# Patient Record
Sex: Female | Born: 1960 | Race: White | Hispanic: No | Smoking: Current every day smoker
Health system: Southern US, Community
[De-identification: ages and names within clinical notes are randomized; demographics above are authoritative.]

## PROBLEM LIST (undated history)

## (undated) DIAGNOSIS — E119 Type 2 diabetes mellitus without complications: Secondary | ICD-10-CM

## (undated) DIAGNOSIS — M436 Torticollis: Secondary | ICD-10-CM

## (undated) DIAGNOSIS — G8929 Other chronic pain: Secondary | ICD-10-CM

## (undated) DIAGNOSIS — M7918 Myalgia, other site: Secondary | ICD-10-CM

## (undated) DIAGNOSIS — N319 Neuromuscular dysfunction of bladder, unspecified: Secondary | ICD-10-CM

## (undated) DIAGNOSIS — I10 Essential (primary) hypertension: Secondary | ICD-10-CM

## (undated) HISTORY — DX: Other chronic pain: G89.29

## (undated) HISTORY — DX: Torticollis: M43.6

## (undated) HISTORY — DX: Essential (primary) hypertension: I10

## (undated) HISTORY — DX: Myalgia, other site: M79.18

## (undated) HISTORY — DX: Neuromuscular dysfunction of bladder, unspecified: N31.9

## (undated) HISTORY — DX: Type 2 diabetes mellitus without complications: E11.9

---

## 1998-08-25 ENCOUNTER — Other Ambulatory Visit: Admission: RE | Admit: 1998-08-25 | Discharge: 1998-08-25 | Payer: Self-pay | Admitting: Obstetrics and Gynecology

## 1999-09-19 ENCOUNTER — Other Ambulatory Visit: Admission: RE | Admit: 1999-09-19 | Discharge: 1999-09-19 | Payer: Self-pay | Admitting: General Practice

## 2003-11-11 ENCOUNTER — Emergency Department (HOSPITAL_COMMUNITY): Admission: EM | Admit: 2003-11-11 | Discharge: 2003-11-11 | Payer: Self-pay | Admitting: Emergency Medicine

## 2005-03-18 ENCOUNTER — Emergency Department (HOSPITAL_COMMUNITY): Admission: EM | Admit: 2005-03-18 | Discharge: 2005-03-18 | Payer: Self-pay | Admitting: Emergency Medicine

## 2008-04-26 ENCOUNTER — Emergency Department (HOSPITAL_BASED_OUTPATIENT_CLINIC_OR_DEPARTMENT_OTHER): Admission: EM | Admit: 2008-04-26 | Discharge: 2008-04-26 | Payer: Self-pay | Admitting: Emergency Medicine

## 2019-04-18 IMAGING — CR DG THORACOLUMBAR SPINE 2V
1 series · 1 of 1 positions shown · non-contrast
Comparison: Intraoperative imaging earlier

CLINICAL DATA: L1-2 laminectomy

EXAM:
THORACOLUMBAR SPINE 1V

[lateral]
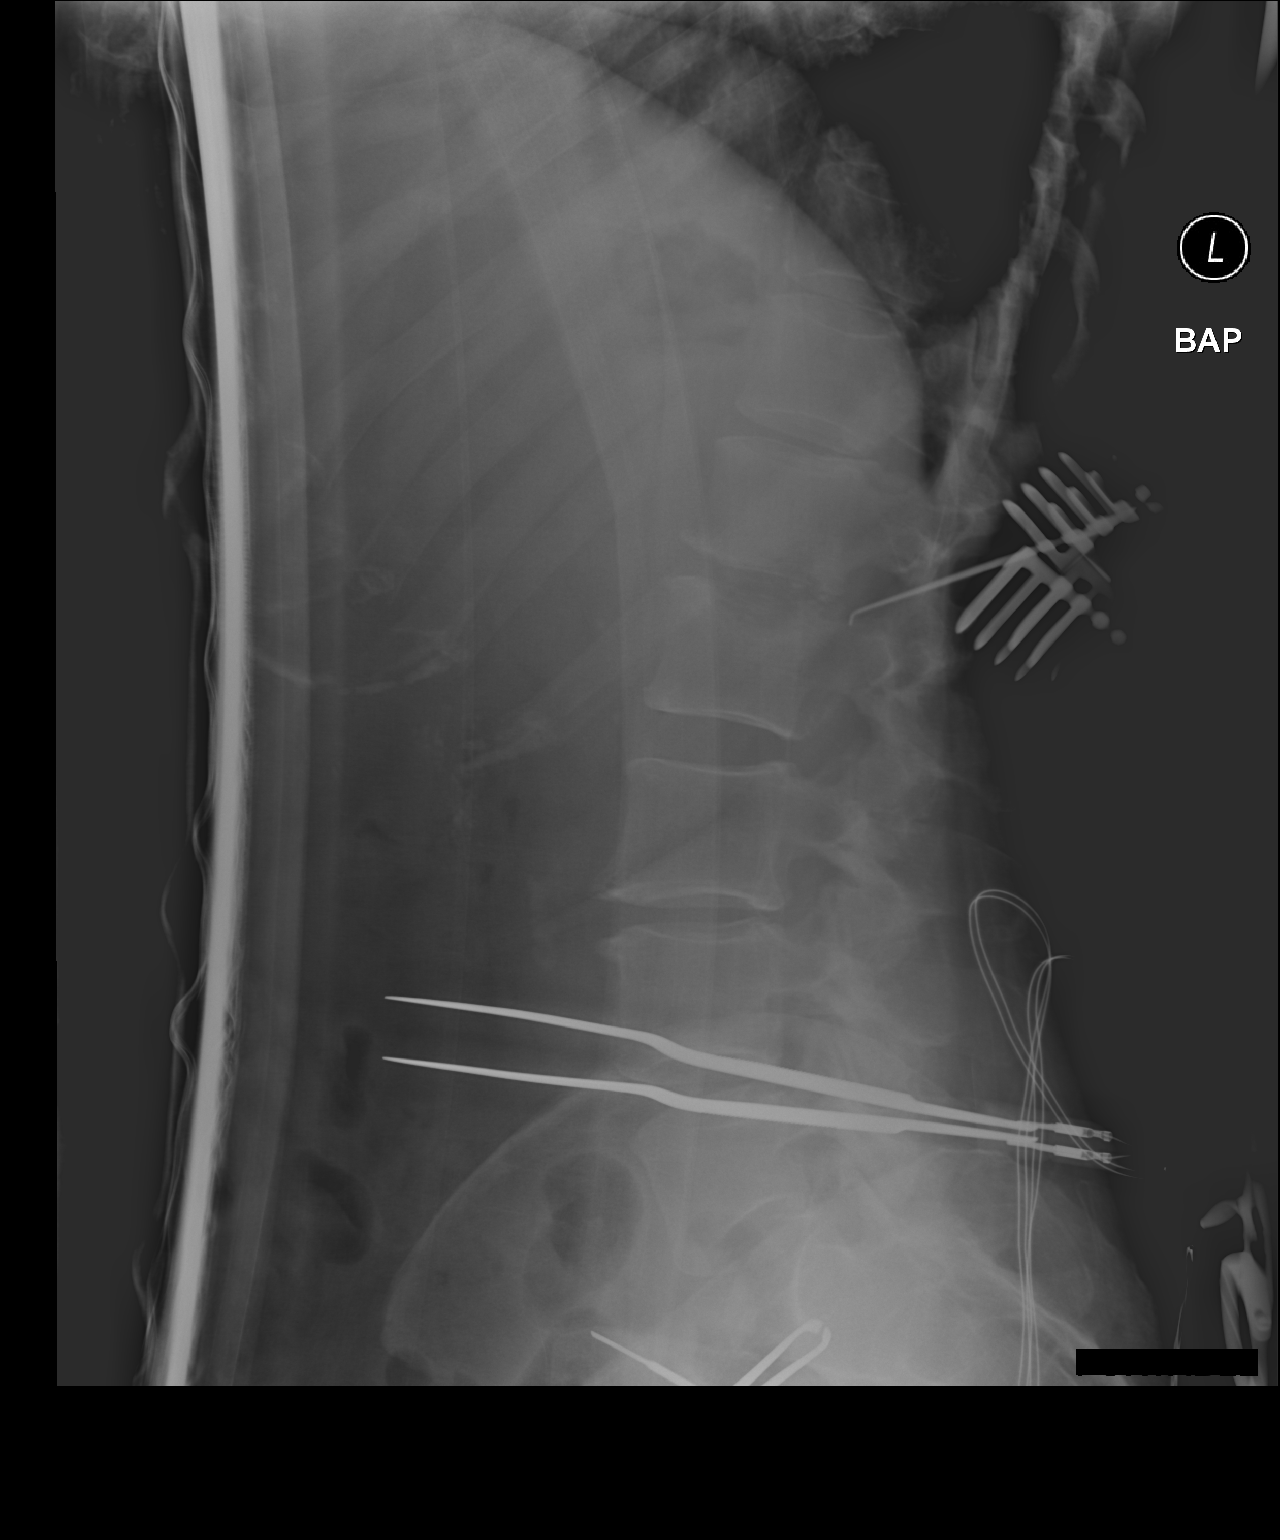

[1 of 1 positions shown; findings below may reference images not displayed]

FINDINGS: Single cross-table lateral view of the lumbar spine demonstrates
posterior surgical instruments directed at the L1-2 level.
IMPRESSION: Intraoperative localization as above.

## 2019-04-18 IMAGING — CR DG THORACOLUMBAR SPINE 2V
3 series · 3 of 3 positions shown · non-contrast
Comparison: None.

CLINICAL DATA: Elective surgery

EXAM:
THORACOLUMBAR SPINE 1V

[lateral (1 of 3)]
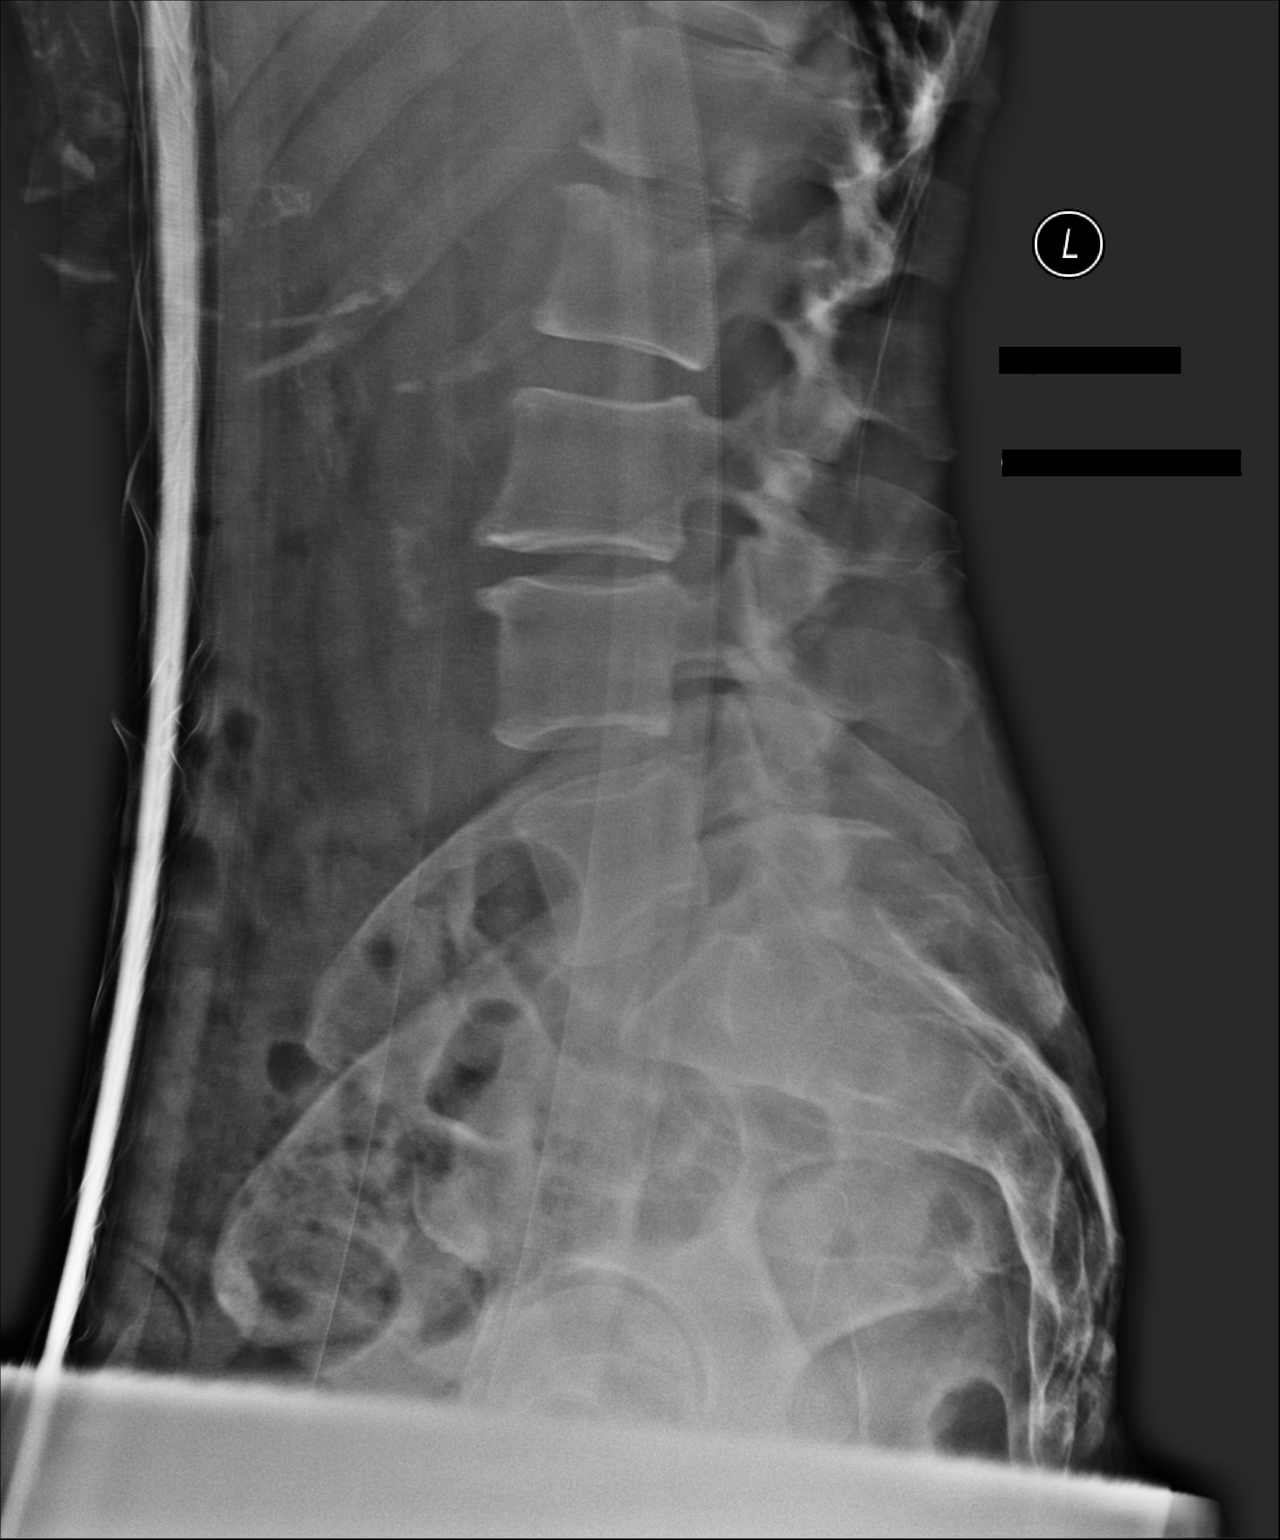

[lateral (2 of 3)]
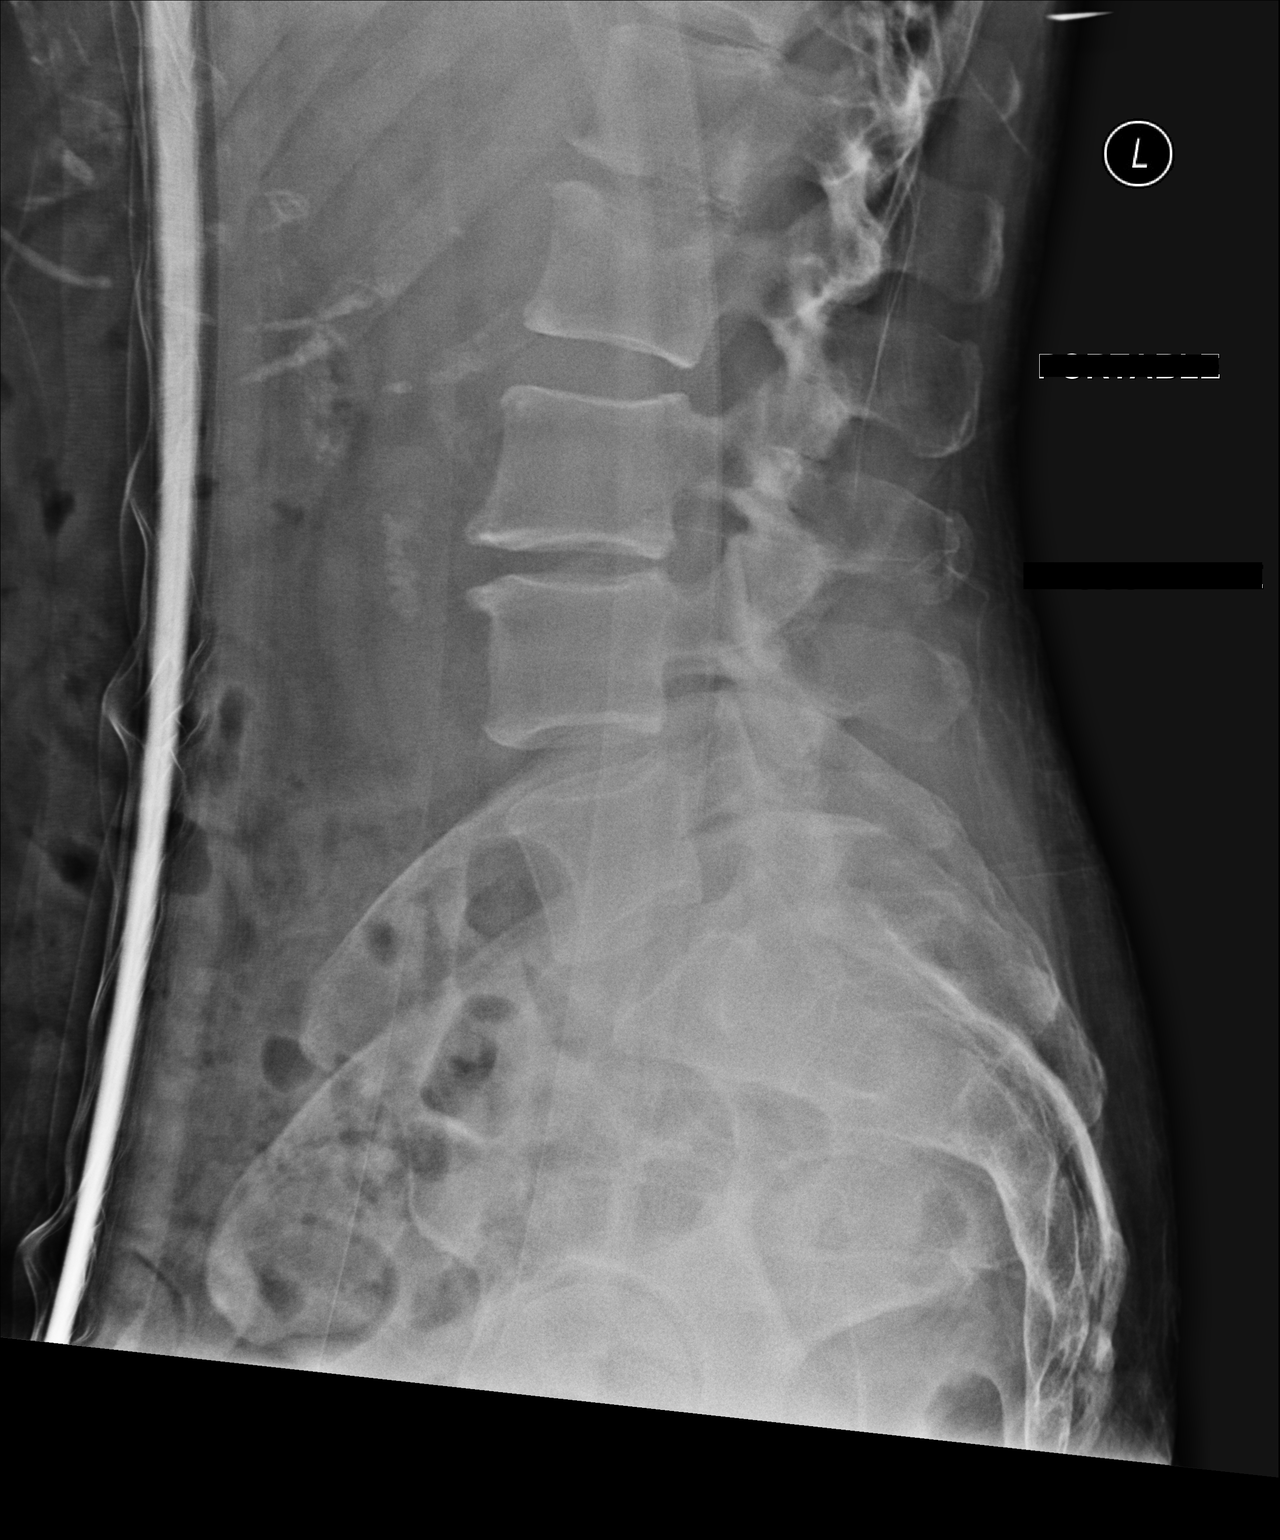

[lateral (3 of 3)]
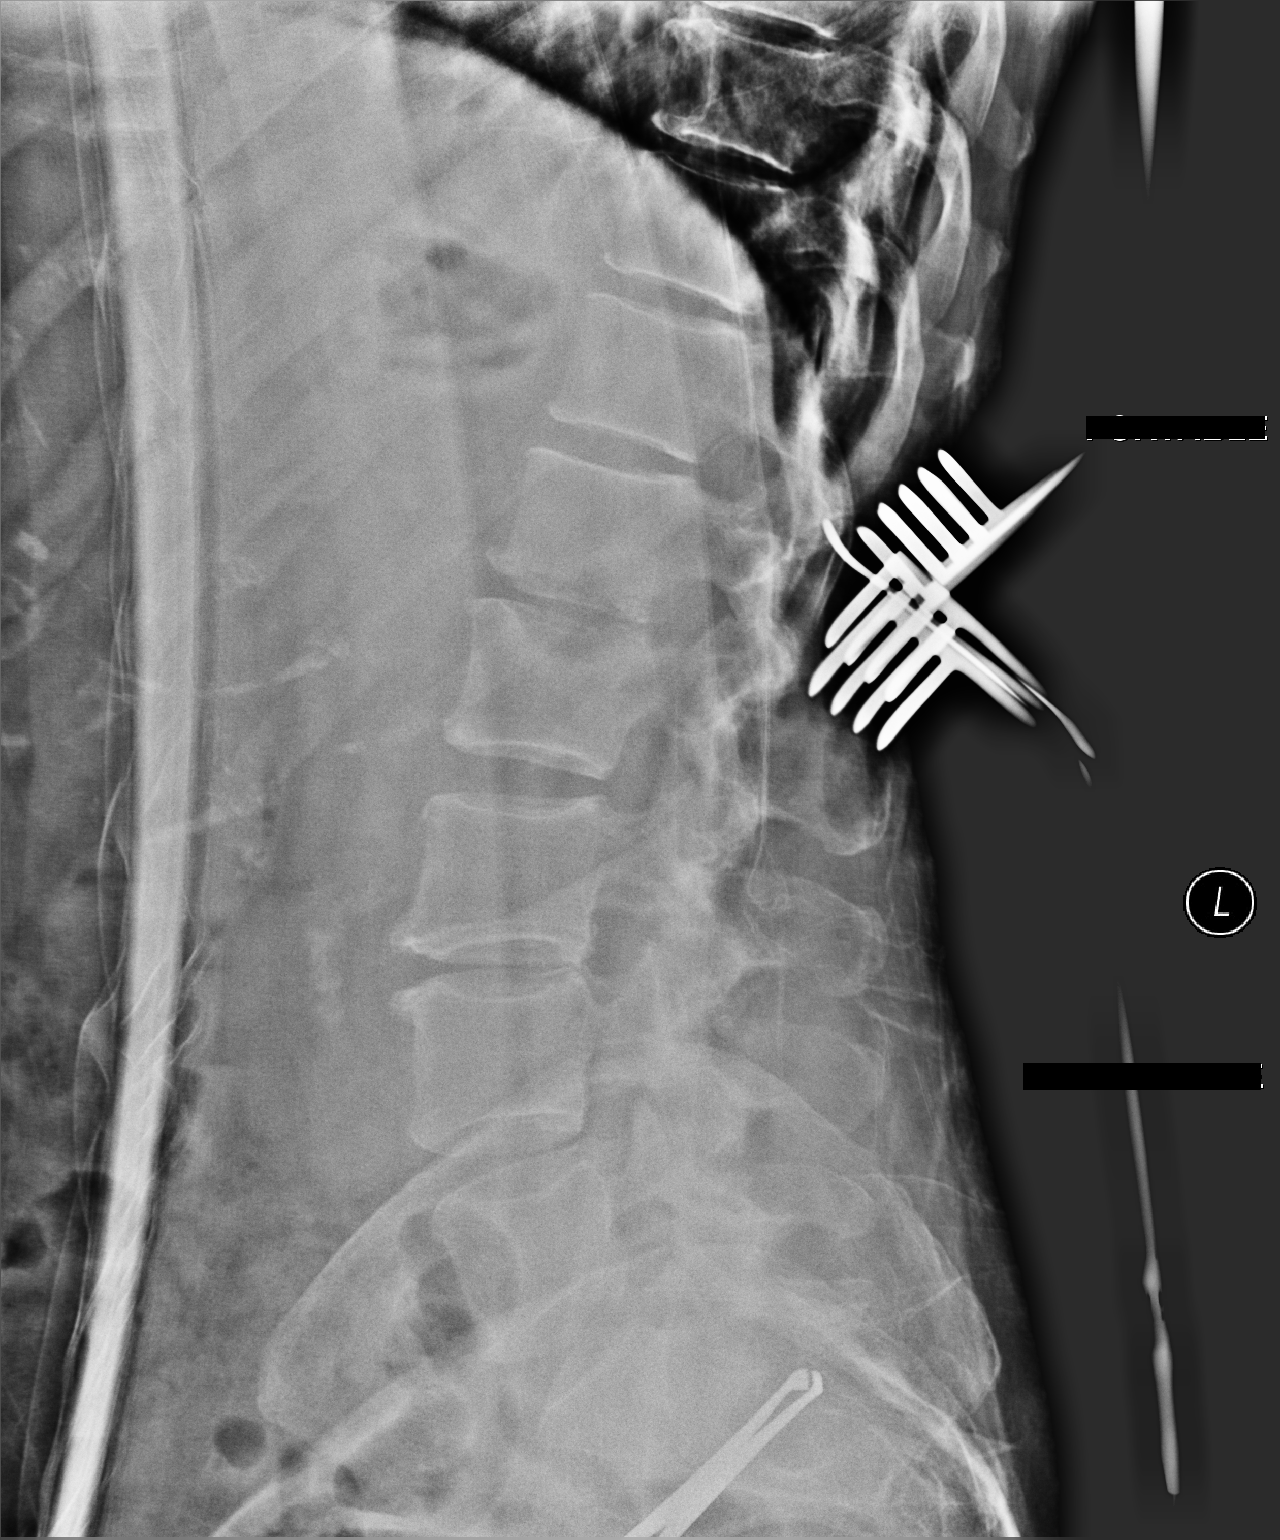

[3 of 3 positions shown; findings below may reference images not displayed]

FINDINGS: Cross-table lateral views of the lumbar spine are submitted. Final
image demonstrates posterior surgical instruments directed at the
T12-L1 level.
IMPRESSION: Intraoperative localization as above.

## 2020-02-24 ENCOUNTER — Emergency Department (HOSPITAL_COMMUNITY)
Admission: EM | Admit: 2020-02-24 | Discharge: 2020-02-25 | Disposition: A | Discharge status: Home / Self Care | Payer: Medicaid Other | Attending: Emergency Medicine | Admitting: Emergency Medicine

## 2020-02-24 ENCOUNTER — Encounter (HOSPITAL_COMMUNITY): Payer: Self-pay

## 2020-02-24 ENCOUNTER — Emergency Department (HOSPITAL_COMMUNITY): Payer: Medicaid Other

## 2020-02-24 ENCOUNTER — Other Ambulatory Visit: Payer: Self-pay

## 2020-02-24 DIAGNOSIS — M545 Low back pain, unspecified: Secondary | ICD-10-CM

## 2020-02-24 DIAGNOSIS — M549 Dorsalgia, unspecified: Secondary | ICD-10-CM | POA: Diagnosis present

## 2020-02-24 DIAGNOSIS — K5900 Constipation, unspecified: Secondary | ICD-10-CM | POA: Insufficient documentation

## 2020-02-24 DIAGNOSIS — E876 Hypokalemia: Secondary | ICD-10-CM | POA: Diagnosis not present

## 2020-02-24 DIAGNOSIS — F17210 Nicotine dependence, cigarettes, uncomplicated: Secondary | ICD-10-CM | POA: Diagnosis not present

## 2020-02-24 LAB — CBC
HCT: 38 % (ref 36.0–46.0)
Hemoglobin: 13.2 g/dL (ref 12.0–15.0)
MCH: 29.1 pg (ref 26.0–34.0)
MCHC: 34.7 g/dL (ref 30.0–36.0)
MCV: 83.9 fL (ref 80.0–100.0)
Platelets: 299 10*3/uL (ref 150–400)
RBC: 4.53 MIL/uL (ref 3.87–5.11)
RDW: 13.3 % (ref 11.5–15.5)
WBC: 10.1 10*3/uL (ref 4.0–10.5)
nRBC: 0 % (ref 0.0–0.2)

## 2020-02-24 LAB — URINALYSIS, ROUTINE W REFLEX MICROSCOPIC
Bacteria, UA: NONE SEEN
Bilirubin Urine: NEGATIVE
Glucose, UA: NEGATIVE mg/dL
Ketones, ur: 5 mg/dL — AB
Leukocytes,Ua: NEGATIVE
Nitrite: NEGATIVE
Protein, ur: 100 mg/dL — AB
Specific Gravity, Urine: 1.011 (ref 1.005–1.030)
pH: 6 (ref 5.0–8.0)

## 2020-02-24 LAB — COMPREHENSIVE METABOLIC PANEL
ALT: 22 U/L (ref 0–44)
AST: 21 U/L (ref 15–41)
Albumin: 3.8 g/dL (ref 3.5–5.0)
Alkaline Phosphatase: 77 U/L (ref 38–126)
Anion gap: 16 — ABNORMAL HIGH (ref 5–15)
BUN: 9 mg/dL (ref 6–20)
CO2: 24 mmol/L (ref 22–32)
Calcium: 9.5 mg/dL (ref 8.9–10.3)
Chloride: 96 mmol/L — ABNORMAL LOW (ref 98–111)
Creatinine, Ser: 0.67 mg/dL (ref 0.44–1.00)
GFR calc Af Amer: >60 mL/min (ref >60)
GFR calc non Af Amer: >60 mL/min (ref >60)
Glucose, Bld: 139 mg/dL — ABNORMAL HIGH (ref 70–99)
Potassium: 2.7 mmol/L — CL (ref 3.5–5.1)
Sodium: 136 mmol/L (ref 135–145)
Total Bilirubin: 0.9 mg/dL (ref 0.3–1.2)
Total Protein: 8.6 g/dL — ABNORMAL HIGH (ref 6.5–8.1)

## 2020-02-24 LAB — LIPASE, BLOOD: Lipase: 23 U/L (ref 11–51)

## 2020-02-24 IMAGING — CT CT ABD-PELV W/O CM
2 of 4 series · 16 of 46 positions shown, 18 images · non-contrast
Comparison: None.

CLINICAL DATA: No bowel movement for 5 days, tobacco abuse

EXAM:
CT ABDOMEN AND PELVIS WITHOUT CONTRAST
TECHNIQUE: Multidetector CT imaging of the abdomen and pelvis was performed
following the standard protocol without IV contrast.

[Series 2: axial st · axial · 0.69mm/px · z∈[-462,-92]mm · 13 of 84 slices shown, 15 images]
[im 5/84  soft-tissue]
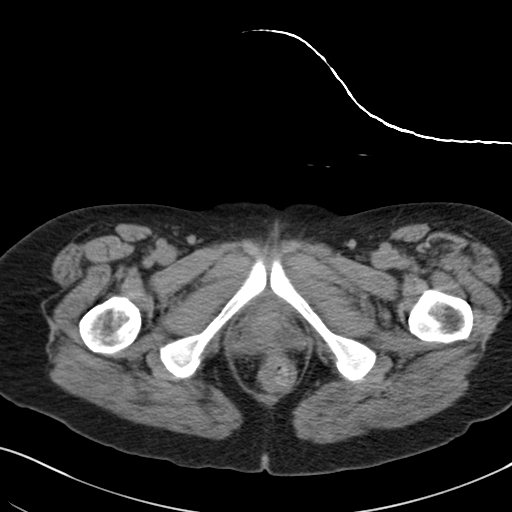
[im 5/84  bone]
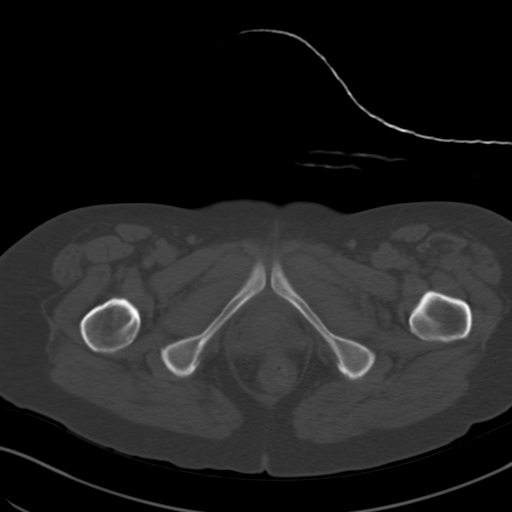
[im 10/84  soft-tissue]
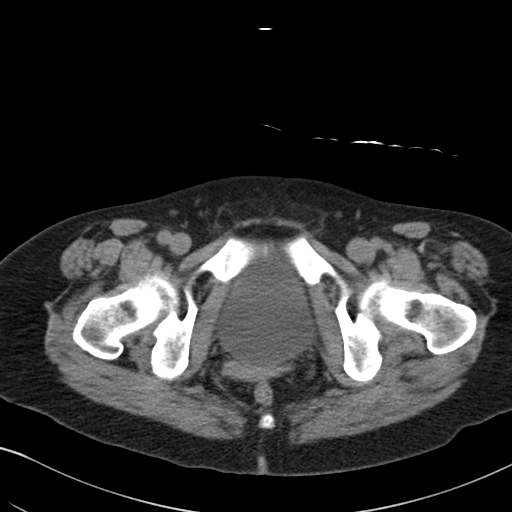
[im 19/84  soft-tissue]
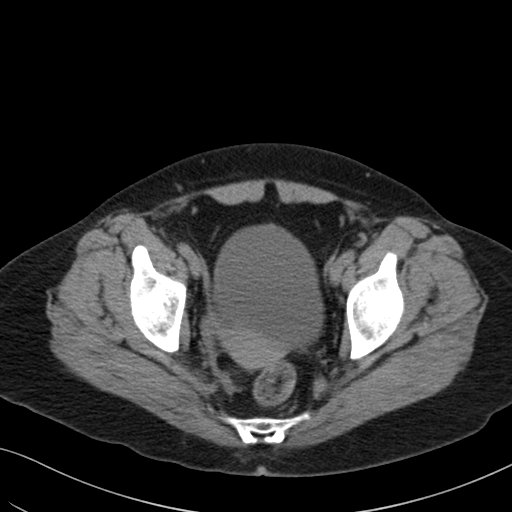
[im 24/84  soft-tissue]
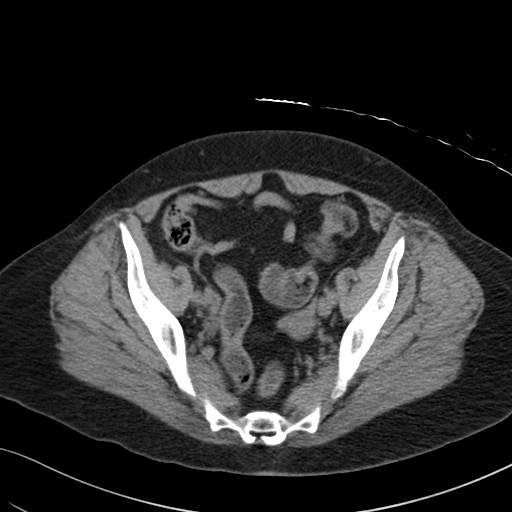
[im 28/84  soft-tissue]
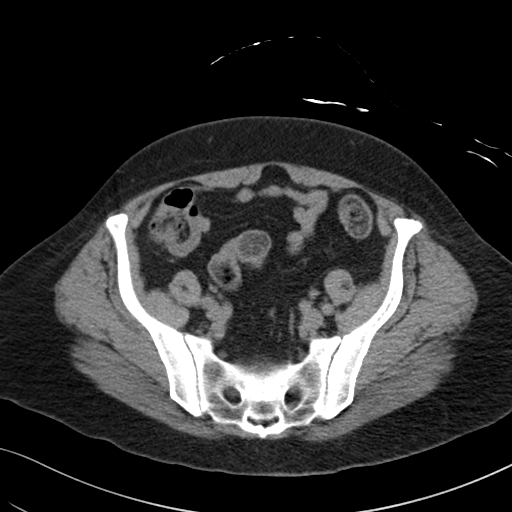
[im 37/84  soft-tissue]
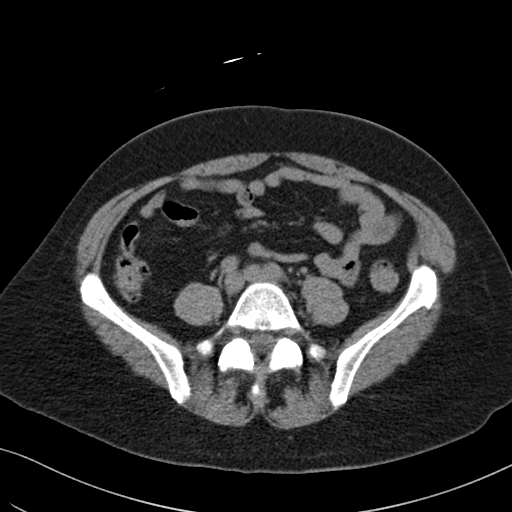
[im 42/84  soft-tissue]
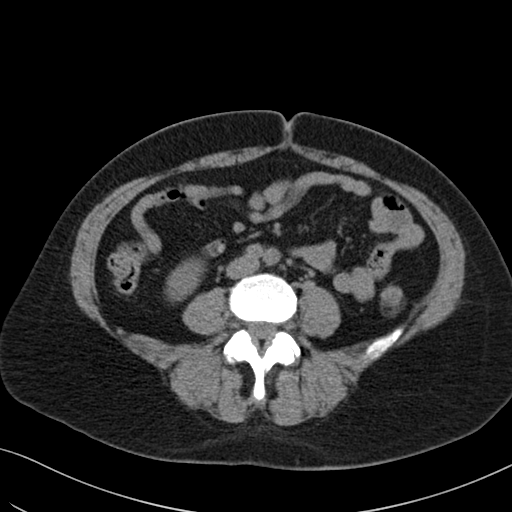
[im 47/84  soft-tissue]
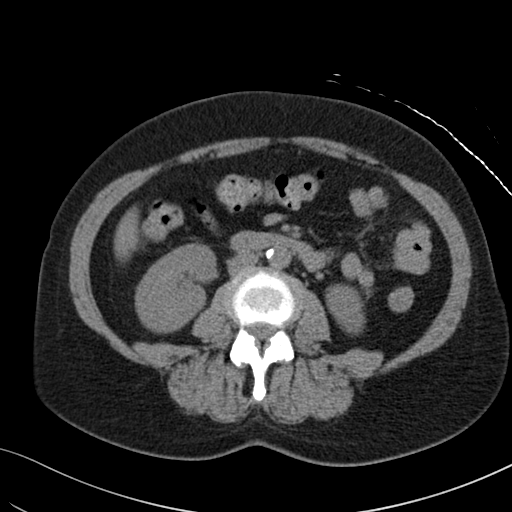
[im 56/84  soft-tissue]
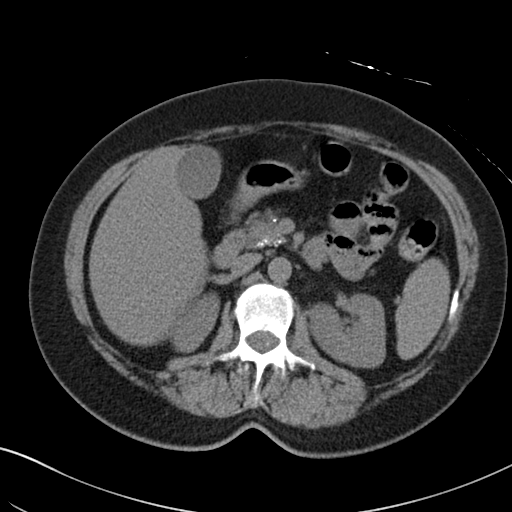
[im 56/84  bone]
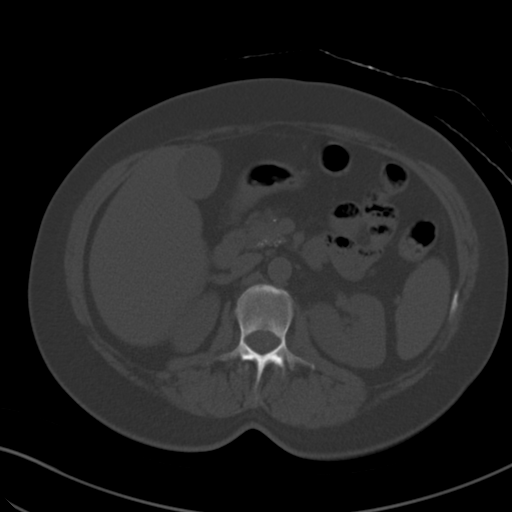
[im 60/84  soft-tissue]
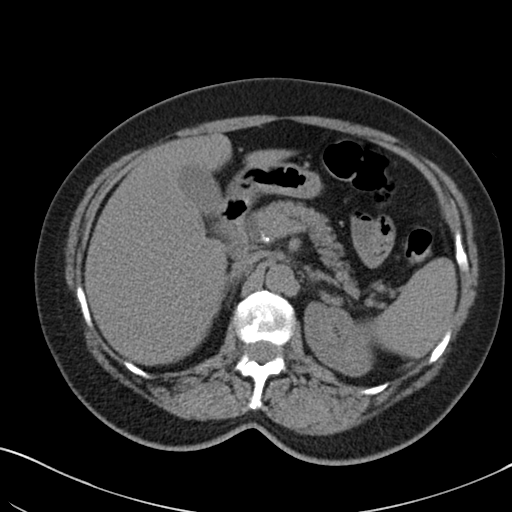
[im 65/84  soft-tissue]
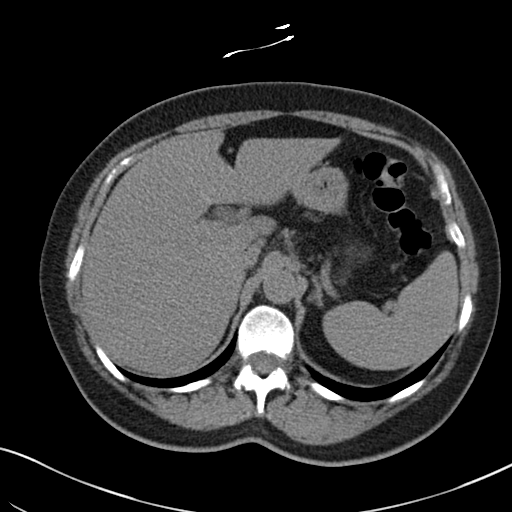
[im 74/84  soft-tissue]
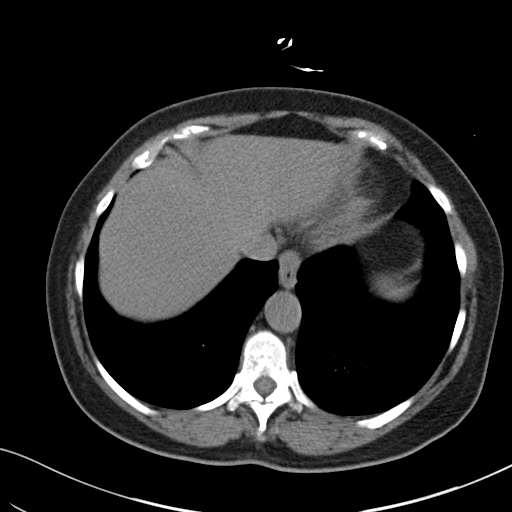
[im 79/84  soft-tissue]
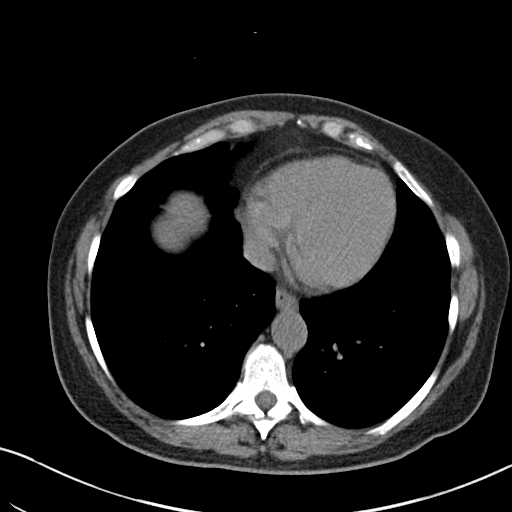

[Series 4: coronal st · coronal · 0.61mm/px · 3 of 133 slices shown]
[im 45/133  soft-tissue]
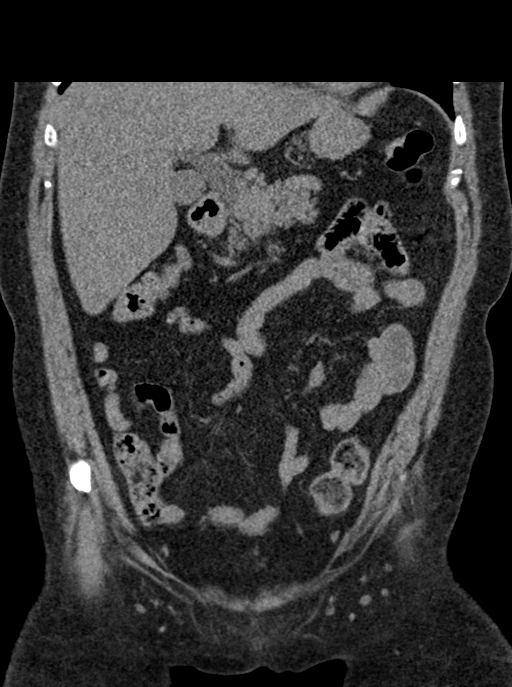
[im 59/133  soft-tissue]
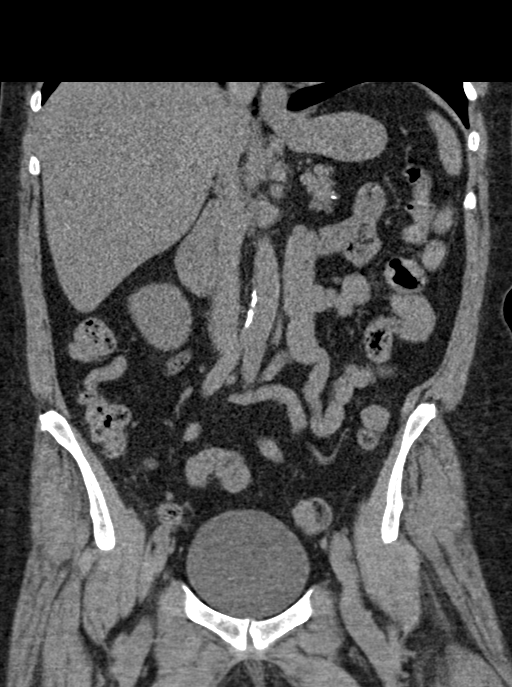
[im 74/133  soft-tissue]
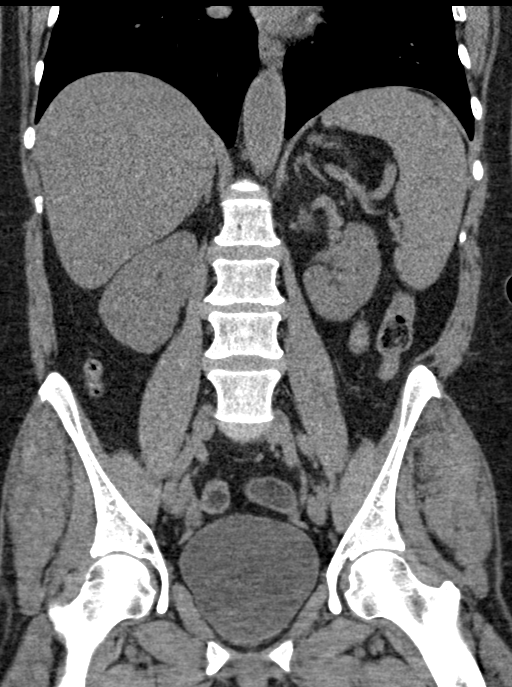

[16 of 46 positions shown; findings below may reference images not displayed]

FINDINGS: Lower chest: No acute pleural or parenchymal lung disease.

Hepatobiliary: No focal liver abnormality is seen. No gallstones,
gallbladder wall thickening, or biliary dilatation.

Pancreas: Parenchymal calcifications consistent with sequela of
chronic calcific pancreatitis. There are no acute inflammatory
changes.

Spleen: Normal in size without focal abnormality.

Adrenals/Urinary Tract: No urinary tract calculi or obstructive
uropathy within either kidney. The bladder is unremarkable. The
adrenals are normal.

Stomach/Bowel: No bowel obstruction or ileus. No bowel wall
thickening or inflammatory changes. Minimal stool within the colon.
Normal appendix right lower quadrant.

Vascular/Lymphatic: Aortic atherosclerosis. No enlarged abdominal or
pelvic lymph nodes.

Reproductive: Uterus and bilateral adnexa are unremarkable.

Other: No abdominal wall hernia or abnormality. No abdominopelvic
ascites.

Musculoskeletal: No acute or destructive bony lesions. Reconstructed
images demonstrate no additional findings.
IMPRESSION: 1. No acute intra-abdominal or intrapelvic process.
2. Sequela of chronic calcific pancreatitis. No evidence of acute
pancreatitis.

## 2020-02-24 MED ORDER — KETOROLAC TROMETHAMINE 30 MG/ML IJ SOLN
30.0000 mg | Freq: Once | INTRAMUSCULAR | Status: AC
  Administered 2020-02-24: 23:00:00 30 mg via INTRAVENOUS
  Filled 2020-02-24: qty 1

## 2020-02-24 MED ORDER — FENTANYL CITRATE (PF) 100 MCG/2ML IJ SOLN
50.0000 ug | Freq: Once | INTRAMUSCULAR | Status: AC
  Administered 2020-02-24: 50 ug via INTRAVENOUS
  Filled 2020-02-24: qty 2

## 2020-02-24 MED ORDER — IOHEXOL 300 MG/ML  SOLN: 100.0000 mL | Freq: Once | INTRAMUSCULAR | Status: DC | PRN

## 2020-02-24 MED ORDER — SODIUM CHLORIDE (PF) 0.9 % IJ SOLN
INTRAMUSCULAR | Status: AC
  Filled 2020-02-24: qty 50

## 2020-02-24 MED ORDER — ONDANSETRON HCL 4 MG/2ML IJ SOLN
4.0000 mg | Freq: Once | INTRAMUSCULAR | Status: AC
  Administered 2020-02-24: 19:00:00 4 mg via INTRAVENOUS
  Filled 2020-02-24: qty 2

## 2020-02-24 MED ORDER — SODIUM CHLORIDE 0.9% FLUSH
3.0000 mL | Freq: Once | INTRAVENOUS | Status: AC
  Administered 2020-02-24: 19:00:00 3 mL via INTRAVENOUS

## 2020-02-24 MED ORDER — SODIUM CHLORIDE 0.9 % IV BOLUS
500.0000 mL | Freq: Once | INTRAVENOUS | Status: AC
  Administered 2020-02-24: 19:00:00 500 mL via INTRAVENOUS

## 2020-02-24 MED ORDER — HYDROMORPHONE HCL 1 MG/ML IJ SOLN
1.0000 mg | Freq: Once | INTRAMUSCULAR | Status: AC
  Administered 2020-02-24: 1 mg via INTRAVENOUS
  Filled 2020-02-24: qty 1

## 2020-02-24 NOTE — ED Notes (Signed)
When entering pt room to administer pain medication, nurse found pt squatting in the corner and urinating over a bedpan that pt had pulled from the cabinet. When nurse questioned pt behavior, pt stated she couldn't make it to the bathroom. Nurse explained to pt that she needs to use her call bell and ask for assistance.

## 2020-02-24 NOTE — ED Provider Notes (Signed)
[facility] DEPT Provider Note   CSN: [id number] Arrival date & time: 02/24/20  1711     History Chief Complaint  Patient presents with  . Abdominal Pain  . Back Pain    Debra Schmidt is a 58 y.o. female.  HPI Patient presents abdominal back pain.  Began around a week ago with left flank/abdominal pain.  States followed by constipation.  States she went out 5 days for bowel movement.  She took a "female laxative".  States that she had a good bowel movement this morning and had some soft bowel movement since.  States it helped the pain some but is returned.  No dysuria.  No fevers or chills.  No nausea.  No blood in the stool.  No diarrhea.  No distention of abdomen.  No hernias.  Has not had pains like this before.    History reviewed. No pertinent past medical history.  There are no problems to display for this patient.   History reviewed. No pertinent surgical history.   OB History   No obstetric history on file.     No family history on file.  Social History   Tobacco Use  . Smoking status: Current Every Day Smoker    Packs/day: 0.50    Types: Cigarettes  . Smokeless tobacco: Never Used  Substance Use Topics  . Alcohol use: Not Currently  . Drug use: Not Currently    Home Medications Prior to Admission medications   Medication Sig Start Date End Date Taking? Authorizing Provider  docusate sodium (STOOL SOFTENER) 100 MG capsule Take 100 mg by mouth daily as needed for mild constipation.   Yes [provider]  ibuprofen (ADVIL) 200 MG tablet Take 200-800 mg by mouth every 6 (six) hours as needed for moderate pain.   Yes [provider]  methocarbamol (ROBAXIN) 500 MG tablet Take 1 tablet (500 mg total) by mouth every 8 (eight) hours as needed for muscle spasms. 02/25/20   Davonna Belling, MD  potassium chloride SA (KLOR-CON) 20 MEQ tablet Take 1 tablet (20 mEq total) by mouth 2 (two) times daily. 02/25/20    Davonna Belling, MD    Allergies    Penicillins  Review of Systems   Review of Systems  Constitutional: Negative for appetite change.  HENT: Negative for congestion.   Gastrointestinal: Positive for abdominal pain. Negative for blood in stool, nausea and vomiting.  Genitourinary: Negative for flank pain.  Musculoskeletal: Positive for back pain.  Skin: Negative for rash.  Neurological: Negative for weakness.    Physical Exam Updated Vital Signs BP (!) 195/111 Comment: movement  Pulse 84   Temp 98 F (36.7 C) (Oral)   Resp (!) 22   Ht 5\' 4"  (1.626 m)   Wt 64.4 kg   SpO2 99%   BMI 24.37 kg/m   Physical Exam Vitals and nursing note reviewed.  Constitutional:      Comments: Patient appears uncomfortable  HENT:     Head: Atraumatic.  Cardiovascular:     Rate and Rhythm: Regular rhythm.  Pulmonary:     Breath sounds: Normal breath sounds.  Abdominal:     Comments: Tenderness in left mid lower abdomen.  No hernia.  No rebound or guarding.  May have some mild distention  Skin:    General: Skin is warm.     Capillary Refill: Capillary refill takes less than 2 seconds.  Neurological:     Mental Status: She is alert.  Comments: Patient is awake and appropriate.     ED Results / Procedures / Treatments   Labs (all labs ordered are listed, but only abnormal results are displayed) Labs Reviewed  COMPREHENSIVE METABOLIC PANEL - Abnormal; Notable for the following components:      Result Value   Potassium 2.7 (*)    Chloride 96 (*)    Glucose, Bld 139 (*)    Total Protein 8.6 (*)    Anion gap 16 (*)    All other components within normal limits  URINALYSIS, ROUTINE W REFLEX MICROSCOPIC - Abnormal; Notable for the following components:   Hgb urine dipstick SMALL (*)    Ketones, ur 5 (*)    Protein, ur 100 (*)    All other components within normal limits  LIPASE, BLOOD  CBC    EKG None  Radiology CT ABDOMEN PELVIS WO CONTRAST  Result Date:  02/24/2020 CLINICAL DATA:  No bowel movement for 5 days, tobacco abuse EXAM: CT ABDOMEN AND PELVIS WITHOUT CONTRAST TECHNIQUE: Multidetector CT imaging of the abdomen and pelvis was performed following the standard protocol without IV contrast. COMPARISON:  None. FINDINGS: Lower chest: No acute pleural or parenchymal lung disease. Hepatobiliary: No focal liver abnormality is seen. No gallstones, gallbladder wall thickening, or biliary dilatation. Pancreas: Parenchymal calcifications consistent with sequela of chronic calcific pancreatitis. There are no acute inflammatory changes. Spleen: Normal in size without focal abnormality. Adrenals/Urinary Tract: No urinary tract calculi or obstructive uropathy within either kidney. The bladder is unremarkable. The adrenals are normal. Stomach/Bowel: No bowel obstruction or ileus. No bowel wall thickening or inflammatory changes. Minimal stool within the colon. Normal appendix right lower quadrant. Vascular/Lymphatic: Aortic atherosclerosis. No enlarged abdominal or pelvic lymph nodes. Reproductive: Uterus and bilateral adnexa are unremarkable. Other: No abdominal wall hernia or abnormality. No abdominopelvic ascites. Musculoskeletal: No acute or destructive bony lesions. Reconstructed images demonstrate no additional findings. IMPRESSION: 1. No acute intra-abdominal or intrapelvic process. 2. Sequela of chronic calcific pancreatitis. No evidence of acute pancreatitis. Electronically Signed   By: Sharlet Salina M.D.   On: 02/24/2020 22:50    Procedures Procedures (including critical care time)  Medications Ordered in ED Medications  iohexol (OMNIPAQUE) 300 MG/ML solution 100 mL (100 mLs Intravenous Canceled Entry 02/24/20 2233)  potassium chloride 10 mEq in 100 mL IVPB (has no administration in time range)  methocarbamol (ROBAXIN) tablet 500 mg (has no administration in time range)  sodium chloride flush (NS) 0.9 % injection 3 mL (3 mLs Intravenous Given 02/24/20  1901)  fentaNYL (SUBLIMAZE) injection 50 mcg (50 mcg Intravenous Given 02/24/20 1901)  ondansetron (ZOFRAN) injection 4 mg (4 mg Intravenous Given 02/24/20 1900)  sodium chloride 0.9 % bolus 500 mL (0 mLs Intravenous Stopped 02/24/20 1946)  HYDROmorphone (DILAUDID) injection 1 mg (1 mg Intravenous Given 02/24/20 2226)  ketorolac (TORADOL) 30 MG/ML injection 30 mg (30 mg Intravenous Given 02/24/20 2323)    ED Course  I have reviewed the triage vital signs and the nursing notes.  Pertinent labs & imaging results that were available during my care of the patient were reviewed by me and considered in my medical decision making (see chart for details).    MDM Rules/Calculators/A&P                      Patient with back pain going to the abdomen.  States there has been muscle cramps.  CT scan done and reassuring.  Patient states she feels somewhat better after Toradol.  Feels did not work.  Has hypokalemia and with potential muscle cramps will give some IV potassium.  Will discharge home with muscle relaxers.  Patient will need to follow-up with PCP.  Blood pressure elevated here but could be pain related.  Will need follow-up as an outpatient Final Clinical Impression(s) / ED Diagnoses Final diagnoses:  Acute low back pain without sciatica, unspecified back pain laterality  Hypokalemia    Rx / DC Orders ED Discharge Orders         Ordered    methocarbamol (ROBAXIN) 500 MG tablet  Every 8 hours PRN     02/25/20 0020    potassium chloride SA (KLOR-CON) 20 MEQ tablet  2 times daily     02/25/20 Doreen Beam, MD 02/25/20 [phone]

## 2020-02-24 NOTE — ED Notes (Signed)
Pt continues to yell out and is still thrashing in the bed. Have told pt multiple times that this behavior is unnecessary and this is also the exact behavior that resulted in her previous IV being pulled out.

## 2020-02-24 NOTE — ED Notes (Signed)
Pt writhing around excessively on the stretcher and screaming. Pt IV became caught on side rail of stretcher and was pulled out. IV intake upon inspection by RN.

## 2020-02-24 NOTE — ED Notes (Signed)
Date and time results received: 02/24/20 1920 (use smartphrase ".now" to insert current time)  Test: K+ Critical Value: 2.7  Name of Provider Notified: Rubin Payor  Orders Received? Or Actions Taken?: waiting on orders

## 2020-02-24 NOTE — ED Notes (Signed)
Nurse has made 2 attempts to replace IV. Unable to do so due to pt continuing to flail around on the bed.

## 2020-02-24 NOTE — ED Triage Notes (Signed)
Patient reports not having BM in 5 days and took laxative last night has BM this morning that was normal. The off and on today patient reports soft bm that is not normal for her. Patient reports that it is now causing lower back pain.   10/10  A/O X4 Wheelchair in triage   Denies urinary symptoms

## 2020-02-24 NOTE — ED Notes (Signed)
Pt continues to throw self around in the bed. Scooting to the edge of the bed, rolling back and forth, etc.

## 2020-02-25 MED ORDER — POTASSIUM CHLORIDE 10 MEQ/100ML IV SOLN
10.0000 meq | Freq: Once | INTRAVENOUS | Status: AC
  Administered 2020-02-25: 10 meq via INTRAVENOUS
  Filled 2020-02-25: qty 100

## 2020-02-25 MED ORDER — METHOCARBAMOL 500 MG PO TABS
500.0000 mg | ORAL_TABLET | Freq: Once | ORAL | Status: AC
  Administered 2020-02-25: 500 mg via ORAL
  Filled 2020-02-25: qty 1

## 2020-02-25 MED ORDER — METHOCARBAMOL 500 MG PO TABS: 500.0000 mg | ORAL_TABLET | Freq: Three times a day (TID) | ORAL | 0 refills | Status: DC | PRN

## 2020-02-25 MED ORDER — POTASSIUM CHLORIDE CRYS ER 20 MEQ PO TBCR: 20.0000 meq | EXTENDED_RELEASE_TABLET | Freq: Two times a day (BID) | ORAL | 0 refills | Status: DC

## 2020-02-25 NOTE — Discharge Instructions (Signed)
Follow-up with your doctor for management of back pain potassium and muscle cramps

## 2020-04-23 ENCOUNTER — Emergency Department (HOSPITAL_COMMUNITY): Payer: Medicaid Other

## 2020-04-23 ENCOUNTER — Emergency Department (HOSPITAL_COMMUNITY): Payer: Medicaid Other | Admitting: Anesthesiology

## 2020-04-23 ENCOUNTER — Encounter (HOSPITAL_COMMUNITY)
Admission: EM | Disposition: A | Discharge status: Rehabilitation Facility | Payer: Self-pay | Source: Home / Self Care | Attending: Neurosurgery

## 2020-04-23 ENCOUNTER — Other Ambulatory Visit: Payer: Self-pay

## 2020-04-23 ENCOUNTER — Inpatient Hospital Stay (HOSPITAL_COMMUNITY)
Admission: EM | Admit: 2020-04-23 | Discharge: 2020-05-09 | DRG: 028 | Disposition: A | Discharge status: Rehabilitation Facility | Payer: Medicaid Other | Attending: Neurosurgery | Admitting: Neurosurgery

## 2020-04-23 ENCOUNTER — Inpatient Hospital Stay (HOSPITAL_COMMUNITY): Payer: Medicaid Other

## 2020-04-23 ENCOUNTER — Encounter (HOSPITAL_COMMUNITY): Payer: Self-pay | Admitting: Emergency Medicine

## 2020-04-23 DIAGNOSIS — Z20822 Contact with and (suspected) exposure to covid-19: Secondary | ICD-10-CM | POA: Diagnosis present

## 2020-04-23 DIAGNOSIS — L03113 Cellulitis of right upper limb: Secondary | ICD-10-CM | POA: Diagnosis present

## 2020-04-23 DIAGNOSIS — M4646 Discitis, unspecified, lumbar region: Secondary | ICD-10-CM | POA: Diagnosis present

## 2020-04-23 DIAGNOSIS — E876 Hypokalemia: Secondary | ICD-10-CM | POA: Diagnosis not present

## 2020-04-23 DIAGNOSIS — W182XXA Fall in (into) shower or empty bathtub, initial encounter: Secondary | ICD-10-CM | POA: Diagnosis present

## 2020-04-23 DIAGNOSIS — Z8679 Personal history of other diseases of the circulatory system: Secondary | ICD-10-CM | POA: Diagnosis not present

## 2020-04-23 DIAGNOSIS — K5900 Constipation, unspecified: Secondary | ICD-10-CM | POA: Diagnosis present

## 2020-04-23 DIAGNOSIS — N319 Neuromuscular dysfunction of bladder, unspecified: Secondary | ICD-10-CM | POA: Diagnosis not present

## 2020-04-23 DIAGNOSIS — Y92012 Bathroom of single-family (private) house as the place of occurrence of the external cause: Secondary | ICD-10-CM

## 2020-04-23 DIAGNOSIS — M869 Osteomyelitis, unspecified: Secondary | ICD-10-CM | POA: Diagnosis not present

## 2020-04-23 DIAGNOSIS — I10 Essential (primary) hypertension: Secondary | ICD-10-CM | POA: Diagnosis present

## 2020-04-23 DIAGNOSIS — K861 Other chronic pancreatitis: Secondary | ICD-10-CM | POA: Diagnosis not present

## 2020-04-23 DIAGNOSIS — L0291 Cutaneous abscess, unspecified: Secondary | ICD-10-CM

## 2020-04-23 DIAGNOSIS — R0989 Other specified symptoms and signs involving the circulatory and respiratory systems: Secondary | ICD-10-CM | POA: Diagnosis not present

## 2020-04-23 DIAGNOSIS — M792 Neuralgia and neuritis, unspecified: Secondary | ICD-10-CM | POA: Diagnosis not present

## 2020-04-23 DIAGNOSIS — D649 Anemia, unspecified: Secondary | ICD-10-CM | POA: Diagnosis not present

## 2020-04-23 DIAGNOSIS — G9529 Other cord compression: Secondary | ICD-10-CM | POA: Diagnosis present

## 2020-04-23 DIAGNOSIS — K859 Acute pancreatitis without necrosis or infection, unspecified: Secondary | ICD-10-CM | POA: Diagnosis not present

## 2020-04-23 DIAGNOSIS — R930 Abnormal findings on diagnostic imaging of skull and head, not elsewhere classified: Secondary | ICD-10-CM | POA: Diagnosis not present

## 2020-04-23 DIAGNOSIS — M4626 Osteomyelitis of vertebra, lumbar region: Secondary | ICD-10-CM | POA: Diagnosis not present

## 2020-04-23 DIAGNOSIS — Z9889 Other specified postprocedural states: Secondary | ICD-10-CM

## 2020-04-23 DIAGNOSIS — S41011A Laceration without foreign body of right shoulder, initial encounter: Secondary | ICD-10-CM | POA: Diagnosis present

## 2020-04-23 DIAGNOSIS — R7881 Bacteremia: Secondary | ICD-10-CM | POA: Diagnosis not present

## 2020-04-23 DIAGNOSIS — Y93E1 Activity, personal bathing and showering: Secondary | ICD-10-CM

## 2020-04-23 DIAGNOSIS — M00011 Staphylococcal arthritis, right shoulder: Secondary | ICD-10-CM | POA: Diagnosis not present

## 2020-04-23 DIAGNOSIS — Z880 Allergy status to penicillin: Secondary | ICD-10-CM

## 2020-04-23 DIAGNOSIS — G8222 Paraplegia, incomplete: Secondary | ICD-10-CM | POA: Diagnosis not present

## 2020-04-23 DIAGNOSIS — K8020 Calculus of gallbladder without cholecystitis without obstruction: Secondary | ICD-10-CM | POA: Diagnosis present

## 2020-04-23 DIAGNOSIS — D62 Acute posthemorrhagic anemia: Secondary | ICD-10-CM | POA: Diagnosis present

## 2020-04-23 DIAGNOSIS — G8918 Other acute postprocedural pain: Secondary | ICD-10-CM | POA: Diagnosis not present

## 2020-04-23 DIAGNOSIS — R531 Weakness: Secondary | ICD-10-CM | POA: Diagnosis not present

## 2020-04-23 DIAGNOSIS — Z4789 Encounter for other orthopedic aftercare: Secondary | ICD-10-CM | POA: Diagnosis not present

## 2020-04-23 DIAGNOSIS — N39490 Overflow incontinence: Secondary | ICD-10-CM | POA: Diagnosis present

## 2020-04-23 DIAGNOSIS — E1169 Type 2 diabetes mellitus with other specified complication: Secondary | ICD-10-CM | POA: Diagnosis present

## 2020-04-23 DIAGNOSIS — B9561 Methicillin susceptible Staphylococcus aureus infection as the cause of diseases classified elsewhere: Secondary | ICD-10-CM | POA: Diagnosis present

## 2020-04-23 DIAGNOSIS — G061 Intraspinal abscess and granuloma: Secondary | ICD-10-CM | POA: Diagnosis not present

## 2020-04-23 DIAGNOSIS — M86111 Other acute osteomyelitis, right shoulder: Secondary | ICD-10-CM | POA: Diagnosis not present

## 2020-04-23 DIAGNOSIS — F17210 Nicotine dependence, cigarettes, uncomplicated: Secondary | ICD-10-CM | POA: Diagnosis present

## 2020-04-23 DIAGNOSIS — Q453 Other congenital malformations of pancreas and pancreatic duct: Secondary | ICD-10-CM

## 2020-04-23 DIAGNOSIS — R338 Other retention of urine: Secondary | ICD-10-CM | POA: Diagnosis present

## 2020-04-23 DIAGNOSIS — Z419 Encounter for procedure for purposes other than remedying health state, unspecified: Secondary | ICD-10-CM

## 2020-04-23 DIAGNOSIS — R0682 Tachypnea, not elsewhere classified: Secondary | ICD-10-CM

## 2020-04-23 DIAGNOSIS — Z03818 Encounter for observation for suspected exposure to other biological agents ruled out: Secondary | ICD-10-CM | POA: Diagnosis not present

## 2020-04-23 DIAGNOSIS — M009 Pyogenic arthritis, unspecified: Secondary | ICD-10-CM | POA: Diagnosis present

## 2020-04-23 DIAGNOSIS — M19011 Primary osteoarthritis, right shoulder: Secondary | ICD-10-CM | POA: Diagnosis present

## 2020-04-23 DIAGNOSIS — L02212 Cutaneous abscess of back [any part, except buttock]: Secondary | ICD-10-CM | POA: Diagnosis not present

## 2020-04-23 DIAGNOSIS — I339 Acute and subacute endocarditis, unspecified: Secondary | ICD-10-CM | POA: Diagnosis not present

## 2020-04-23 DIAGNOSIS — Z79899 Other long term (current) drug therapy: Secondary | ICD-10-CM

## 2020-04-23 HISTORY — PX: LUMBAR LAMINECTOMY/DECOMPRESSION MICRODISCECTOMY: SHX5026

## 2020-04-23 LAB — URINALYSIS, ROUTINE W REFLEX MICROSCOPIC
Bilirubin Urine: NEGATIVE
Glucose, UA: NEGATIVE mg/dL
Hgb urine dipstick: NEGATIVE
Ketones, ur: NEGATIVE mg/dL
Leukocytes,Ua: NEGATIVE
Nitrite: NEGATIVE
Protein, ur: NEGATIVE mg/dL
Specific Gravity, Urine: 1.009 (ref 1.005–1.030)
pH: 6 (ref 5.0–8.0)

## 2020-04-23 LAB — COMPREHENSIVE METABOLIC PANEL
ALT: 13 U/L (ref 0–44)
AST: 19 U/L (ref 15–41)
Albumin: 2.4 g/dL — ABNORMAL LOW (ref 3.5–5.0)
Alkaline Phosphatase: 74 U/L (ref 38–126)
Anion gap: 16 — ABNORMAL HIGH (ref 5–15)
BUN: 5 mg/dL — ABNORMAL LOW (ref 6–20)
CO2: 27 mmol/L (ref 22–32)
Calcium: 9.4 mg/dL (ref 8.9–10.3)
Chloride: 97 mmol/L — ABNORMAL LOW (ref 98–111)
Creatinine, Ser: 0.79 mg/dL (ref 0.44–1.00)
GFR calc Af Amer: >60 mL/min (ref >60)
GFR calc non Af Amer: >60 mL/min (ref >60)
Glucose, Bld: 143 mg/dL — ABNORMAL HIGH (ref 70–99)
Potassium: 2 mmol/L — CL (ref 3.5–5.1)
Sodium: 140 mmol/L (ref 135–145)
Total Bilirubin: 0.6 mg/dL (ref 0.3–1.2)
Total Protein: 7.7 g/dL (ref 6.5–8.1)

## 2020-04-23 LAB — BRAIN NATRIURETIC PEPTIDE: B Natriuretic Peptide: 275.3 pg/mL — ABNORMAL HIGH (ref 0.0–100.0)

## 2020-04-23 LAB — BASIC METABOLIC PANEL
Anion gap: 13 (ref 5–15)
BUN: <5 mg/dL — ABNORMAL LOW (ref 6–20)
CO2: 28 mmol/L (ref 22–32)
Calcium: 9.4 mg/dL (ref 8.9–10.3)
Chloride: 99 mmol/L (ref 98–111)
Creatinine, Ser: 0.78 mg/dL (ref 0.44–1.00)
GFR calc Af Amer: >60 mL/min (ref >60)
GFR calc non Af Amer: >60 mL/min (ref >60)
Glucose, Bld: 161 mg/dL — ABNORMAL HIGH (ref 70–99)
Potassium: 2 mmol/L — CL (ref 3.5–5.1)
Sodium: 140 mmol/L (ref 135–145)

## 2020-04-23 LAB — CBC
HCT: 32.2 % — ABNORMAL LOW (ref 36.0–46.0)
Hemoglobin: 10.4 g/dL — ABNORMAL LOW (ref 12.0–15.0)
MCH: 27.4 pg (ref 26.0–34.0)
MCHC: 32.3 g/dL (ref 30.0–36.0)
MCV: 84.7 fL (ref 80.0–100.0)
Platelets: 407 10*3/uL — ABNORMAL HIGH (ref 150–400)
RBC: 3.8 MIL/uL — ABNORMAL LOW (ref 3.87–5.11)
RDW: 15.2 % (ref 11.5–15.5)
WBC: 9.1 10*3/uL (ref 4.0–10.5)
nRBC: 0 % (ref 0.0–0.2)

## 2020-04-23 LAB — I-STAT CHEM 8, ED
BUN: 3 mg/dL — ABNORMAL LOW (ref 6–20)
Calcium, Ion: 1.18 mmol/L (ref 1.15–1.40)
Chloride: 99 mmol/L (ref 98–111)
Creatinine, Ser: 0.6 mg/dL (ref 0.44–1.00)
Glucose, Bld: 157 mg/dL — ABNORMAL HIGH (ref 70–99)
HCT: 30 % — ABNORMAL LOW (ref 36.0–46.0)
Hemoglobin: 10.2 g/dL — ABNORMAL LOW (ref 12.0–15.0)
Potassium: 2 mmol/L — CL (ref 3.5–5.1)
Sodium: 143 mmol/L (ref 135–145)
TCO2: 29 mmol/L (ref 22–32)

## 2020-04-23 LAB — MAGNESIUM: Magnesium: 1.6 mg/dL — ABNORMAL LOW (ref 1.7–2.4)

## 2020-04-23 LAB — SARS CORONAVIRUS 2 BY RT PCR (HOSPITAL ORDER, PERFORMED IN ~~LOC~~ HOSPITAL LAB): SARS Coronavirus 2: NEGATIVE

## 2020-04-23 LAB — LIPASE, BLOOD: Lipase: 25 U/L (ref 11–51)

## 2020-04-23 IMAGING — MR MR THORACIC SPINE W/O CM
4 of 11 series · 16 of 48 positions shown · non-contrast
Comparison: Lumbar MRI today reported separately. CT Abdomen and
Pelvis [DATE].
COMPARISON: Lumbar MRI today reported separately. CT Abdomen and
Pelvis [DATE].

Addendum:
CLINICAL DATA: 58-year-old female with low back pain, urinary
incontinence.

EXAM:
MRI THORACIC SPINE WITHOUT CONTRAST
TECHNIQUE: Multiplanar, multisequence MR imaging of the thoracic spine was
performed. No intravenous contrast was administered.

[Series 5: T2 · sagittal · 3.0mm · 0.66mm/px · 3 of 17 slices shown (1 of 4)]
[im 1/17]
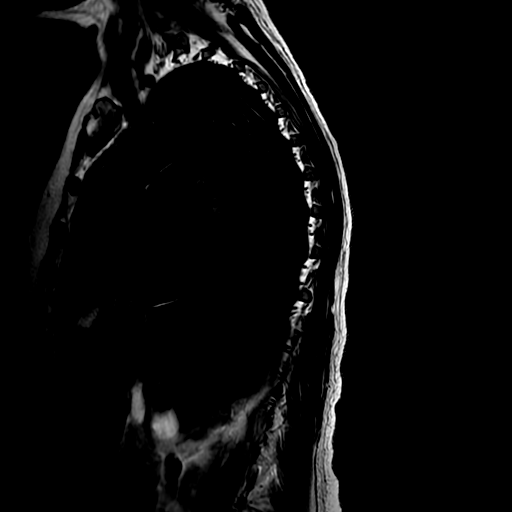
[im 9/17]
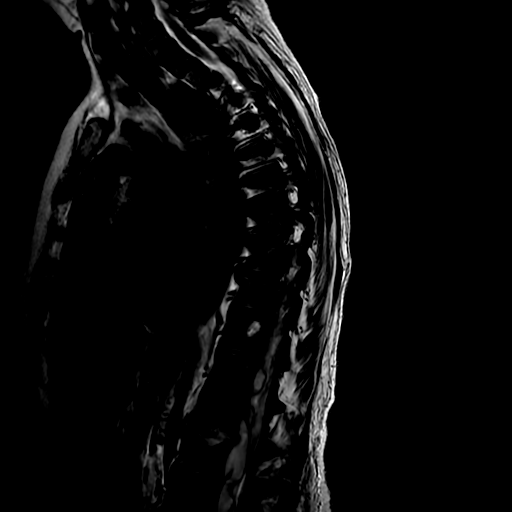
[im 17/17]
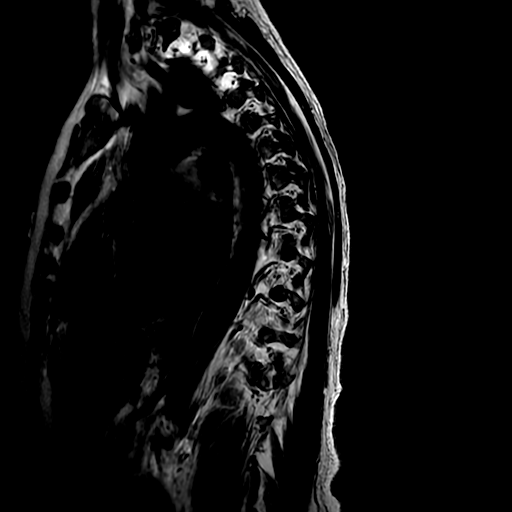

[Series 8: T2 · axial · 4.0mm · 0.39mm/px · z∈[-180,-1]mm · 6 of 35 slices shown (2 of 4)]
[im 1/35]
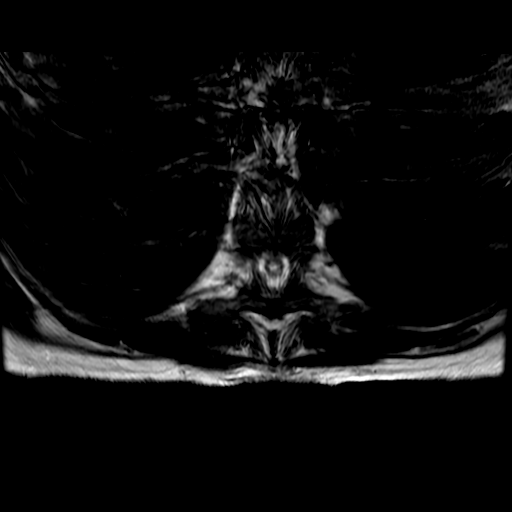
[im 7/35]
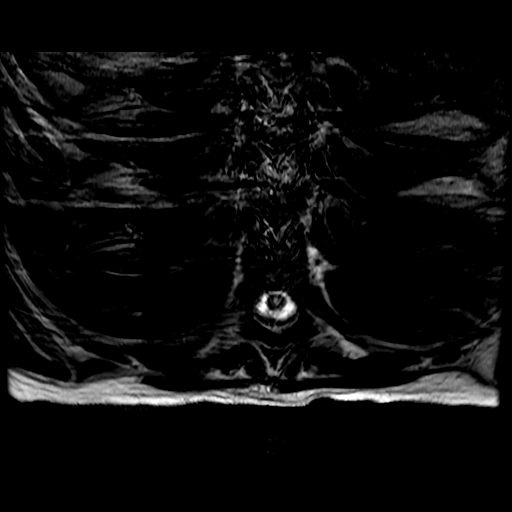
[im 14/35]
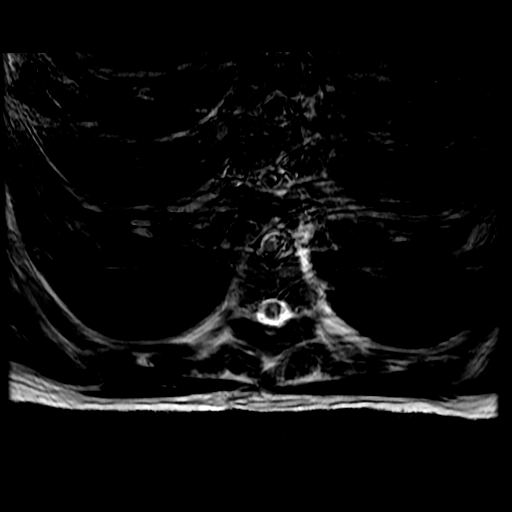
[im 21/35]
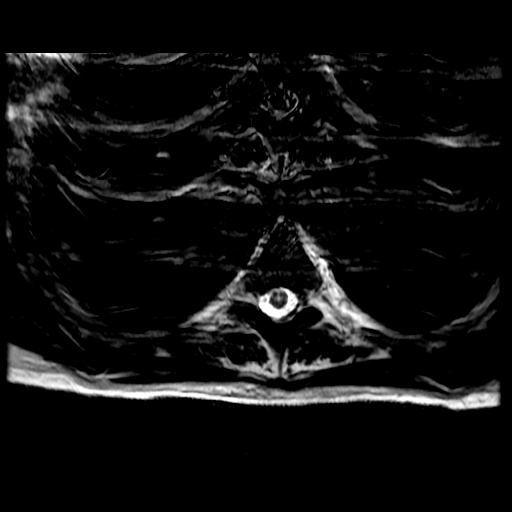
[im 28/35]
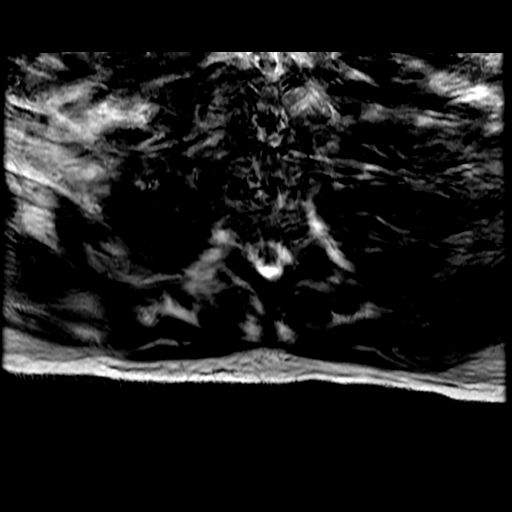
[im 35/35]
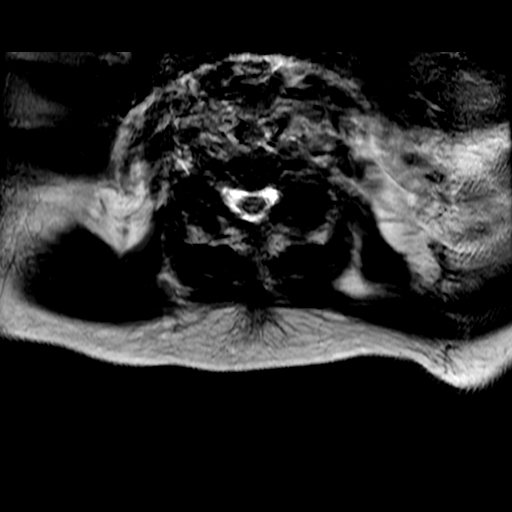

[Series 10: T2 · axial · 4.0mm · 0.39mm/px · z∈[-180,-1]mm · 4 of 35 slices shown (3 of 4)]
[im 1/35]
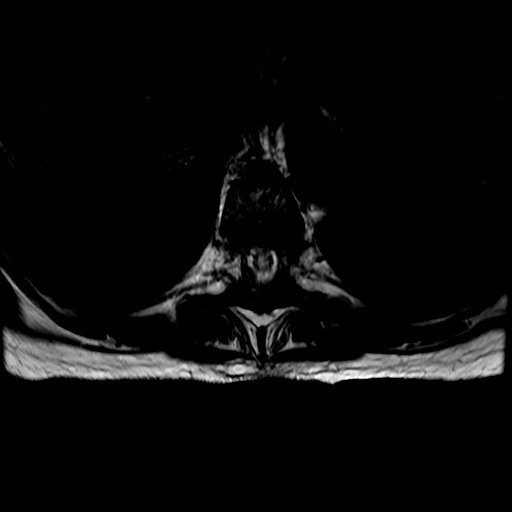
[im 7/35]
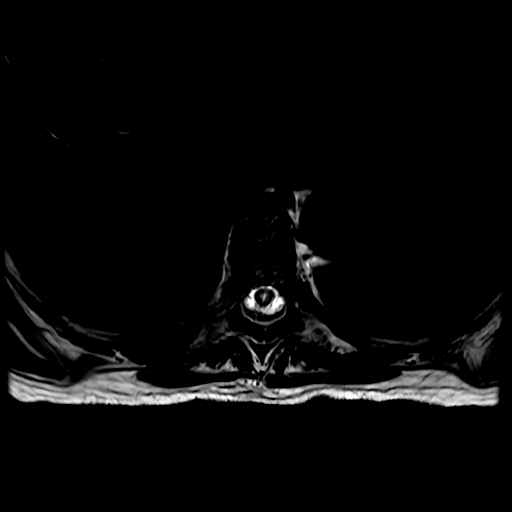
[im 21/35]
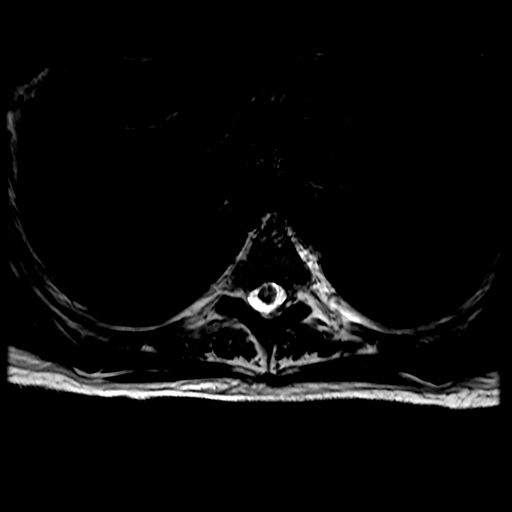
[im 35/35]
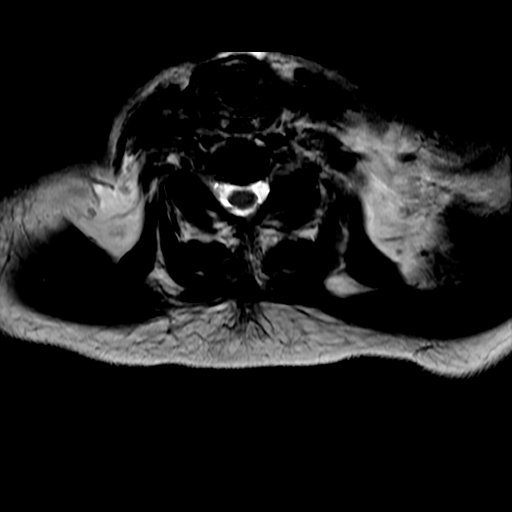

[Series 12: T2 · axial · 4.0mm · 0.39mm/px · z∈[-255,-104]mm · 3 of 29 slices shown (4 of 4)]
[im 1/29]
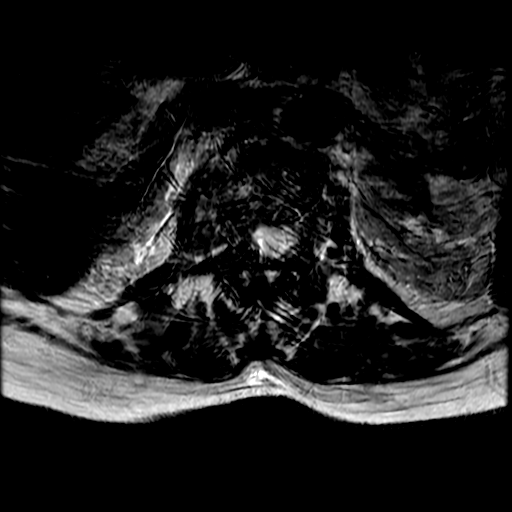
[im 15/29]
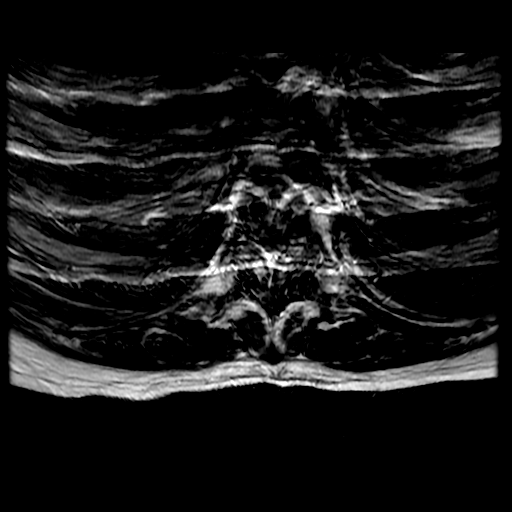
[im 29/29]
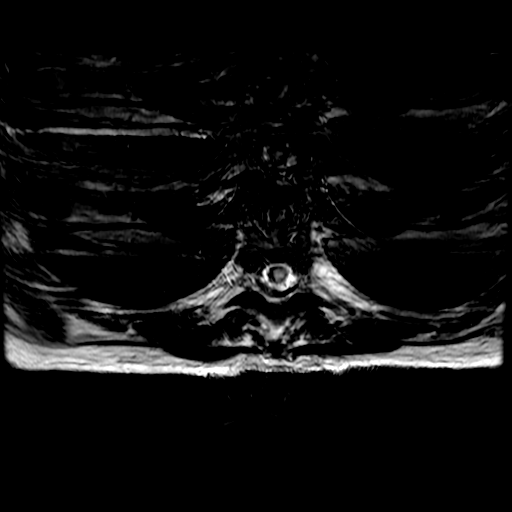

[16 of 48 positions shown; findings below may reference images not displayed]

FINDINGS: Limited cervical spine imaging:  Negative.

Thoracic spine segmentation: Appears to be normal, and concordant
with the lumbar numbering today.

Alignment: Mildly exaggerated thoracic kyphosis. No
spondylolisthesis.

Vertebrae: No acute osseous abnormality identified in the thoracic
spine, thoracic marrow signal appears to remain normal. Abnormal L1
and L2 levels, detailed separately today.

Cord:  Axial images degraded by motion.

Abnormal thoracic spinal cord T2 and STIR hyperintensity within the
central cord beginning at the T8 vertebral level (series 5, image
12, series 8, image 31) and continuing to the cauda equina. Mildly
abnormal central cord signal may extend as high as the T4 level, but
above that the cord appears within normal limits.

Superimposed bulky abnormal intraspinal T2 and STIR hyperintense
signal in the lower thoracic spinal canal beginning at T10-T11
(series 6, image 10) and continuing into the lumbar spinal canal.
Axial images through the segments are degraded by motion but the
cord appears primarily displaced and compressed posteriorly by the
bulky fluid signal space-occupying mass (series 12, image 29). The
dorsal thoracic epidural space is probably abnormal as high as the
T9 level.

Paraspinal and other soft tissues: The lower thoracic paraspinal
soft tissues become abnormal at the T11-T12 level, with STIR
hyperintensity compatible with inflammation. Abnormality continues
into the lumbar spine reported separately.

Grossly negative visible thoracic and upper abdominal viscera.

Disc levels:

Mild thoracic spine degeneration with the exception of T8-T9 where
disc desiccation and disc space loss are accompanied by disc
bulging. No spinal or foraminal stenosis associated.
IMPRESSION: 1. Positive for compression of the lower thoracic spinal cord and
conus by what appears to be a bulky multiloculated intraspinal fluid
collection emanating from the abnormal lumbar L1-L2 level (detailed
separately).
Favor Infectious Discitis Osteomyelitis with extensive Intraspinal
Abscess.

2. Severe abnormal signal in the spinal cord - especially from T8
inferiorly - could be cord edema from compression, but consider also
Venous Cord Infarction from septic spinal venous thrombophlebitis.

3. Recommend Neurosurgery, Neurology, and Infectious Disease
consultation.

ADDENDUM:
Critical findings discussed by telephone with Dr. CALEB

*** End of Addendum ***
FINDINGS: Limited cervical spine imaging:  Negative.

Thoracic spine segmentation: Appears to be normal, and concordant
with the lumbar numbering today.

Alignment: Mildly exaggerated thoracic kyphosis. No
spondylolisthesis.

Vertebrae: No acute osseous abnormality identified in the thoracic
spine, thoracic marrow signal appears to remain normal. Abnormal L1
and L2 levels, detailed separately today.

Cord:  Axial images degraded by motion.

Abnormal thoracic spinal cord T2 and STIR hyperintensity within the
central cord beginning at the T8 vertebral level (series 5, image
12, series 8, image 31) and continuing to the cauda equina. Mildly
abnormal central cord signal may extend as high as the T4 level, but
above that the cord appears within normal limits.

Superimposed bulky abnormal intraspinal T2 and STIR hyperintense
signal in the lower thoracic spinal canal beginning at T10-T11
(series 6, image 10) and continuing into the lumbar spinal canal.
Axial images through the segments are degraded by motion but the
cord appears primarily displaced and compressed posteriorly by the
bulky fluid signal space-occupying mass (series 12, image 29). The
dorsal thoracic epidural space is probably abnormal as high as the
T9 level.

Paraspinal and other soft tissues: The lower thoracic paraspinal
soft tissues become abnormal at the T11-T12 level, with STIR
hyperintensity compatible with inflammation. Abnormality continues
into the lumbar spine reported separately.

Grossly negative visible thoracic and upper abdominal viscera.

Disc levels:

Mild thoracic spine degeneration with the exception of T8-T9 where
disc desiccation and disc space loss are accompanied by disc
bulging. No spinal or foraminal stenosis associated.
IMPRESSION: 1. Positive for compression of the lower thoracic spinal cord and
conus by what appears to be a bulky multiloculated intraspinal fluid
collection emanating from the abnormal lumbar L1-L2 level (detailed
separately).
Favor Infectious Discitis Osteomyelitis with extensive Intraspinal
Abscess.

2. Severe abnormal signal in the spinal cord - especially from T8
inferiorly - could be cord edema from compression, but consider also
Venous Cord Infarction from septic spinal venous thrombophlebitis.

3. Recommend Neurosurgery, Neurology, and Infectious Disease
consultation.

## 2020-04-23 IMAGING — MR MR LUMBAR SPINE W/O CM
4 of 5 series · 18 of 48 positions shown · non-contrast
Comparison: CT Abdomen and Pelvis [DATE]. Thoracic spine MRI
today reported separately.

CLINICAL DATA: 58-year-old female with low back pain, urinary
incontinence.

EXAM:
MRI LUMBAR SPINE WITHOUT CONTRAST
TECHNIQUE: Multiplanar, multisequence MR imaging of the lumbar spine was
performed. No intravenous contrast was administered.

[Series 2: T2 · sagittal · 4.0mm · 0.55mm/px · 6 of 15 slices shown (1 of 2)]
[im 1/15]
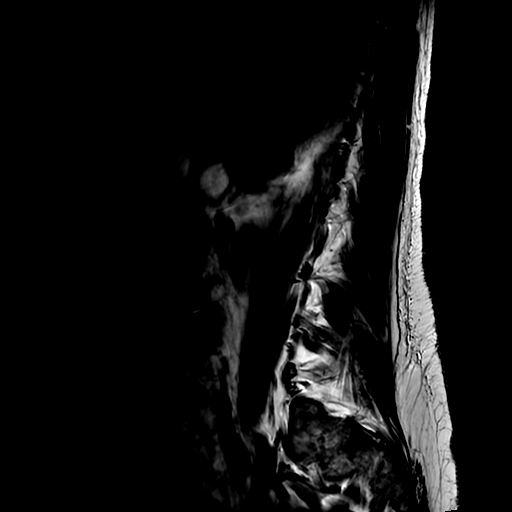
[im 3/15]
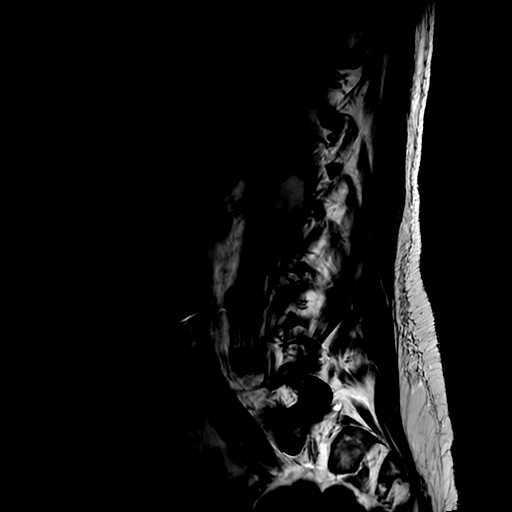
[im 6/15]
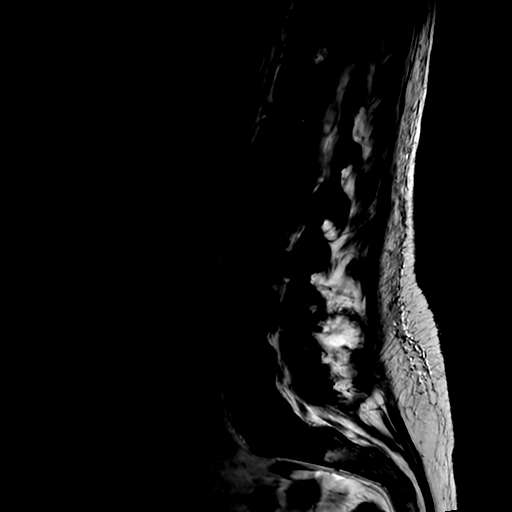
[im 9/15]
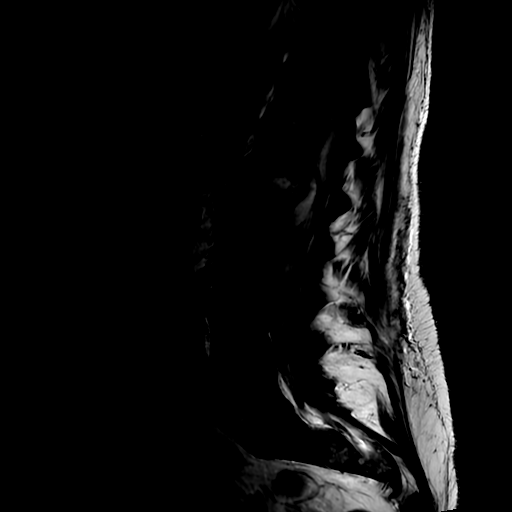
[im 12/15]
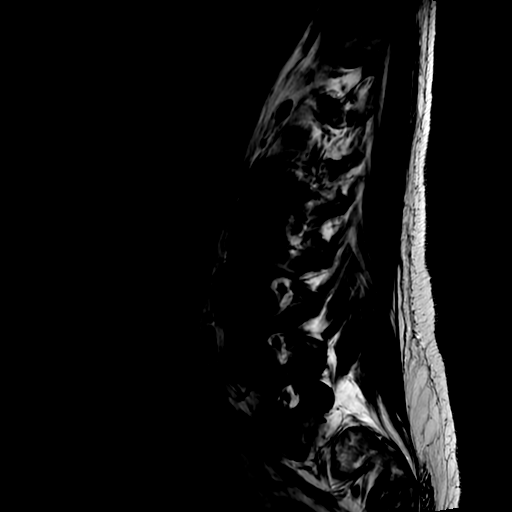
[im 15/15]
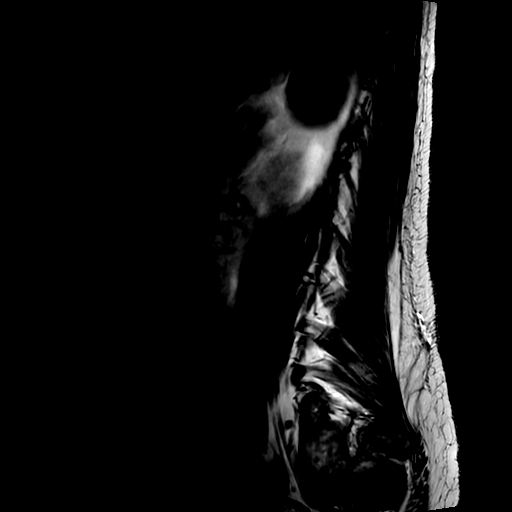

[Series 4: T1 · sagittal · 4.0mm · 0.55mm/px · 3 of 15 slices shown (1 of 2)]
[im 3/15]
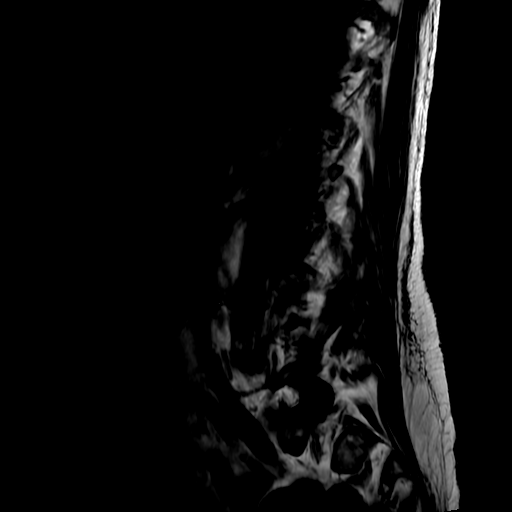
[im 9/15]
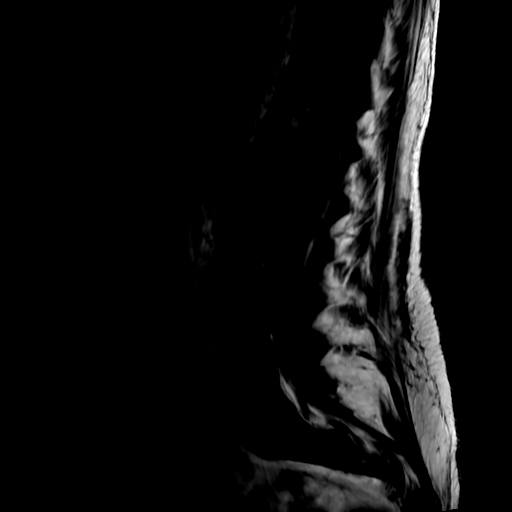
[im 15/15]
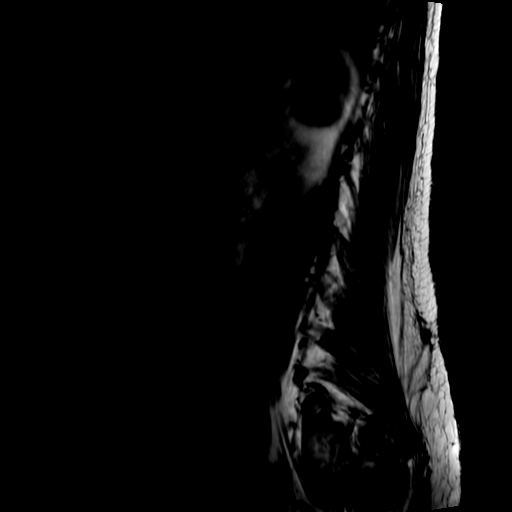

[Series 5: T2 · axial · 4.0mm · 0.39mm/px · z∈[-113,+42]mm · 6 of 38 slices shown (2 of 2)]
[im 1/38]
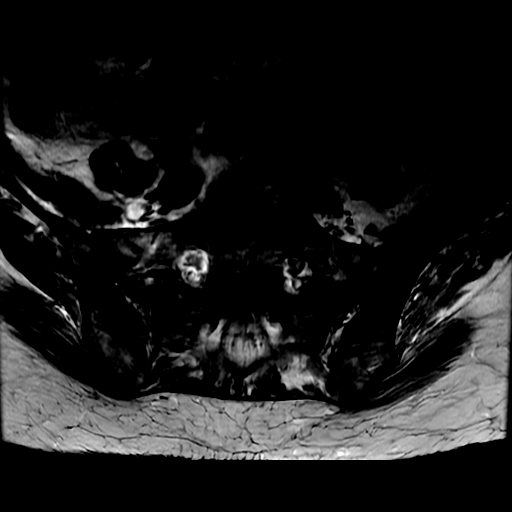
[im 6/38]
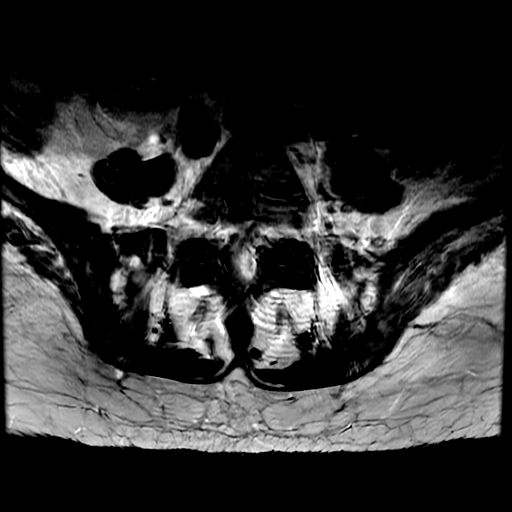
[im 11/38]
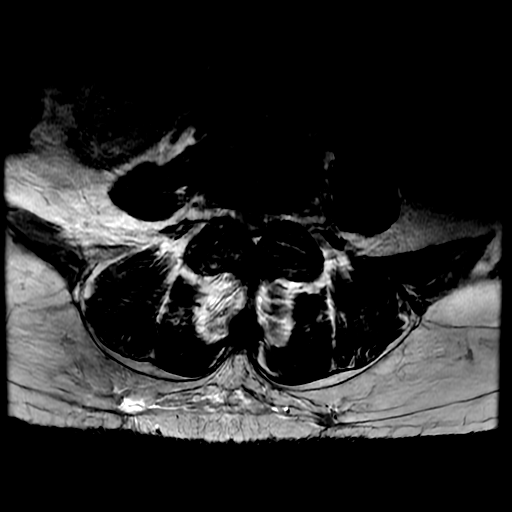
[im 16/38]
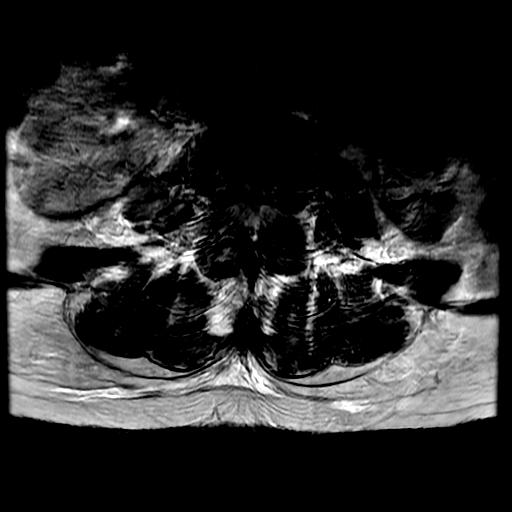
[im 19/38]
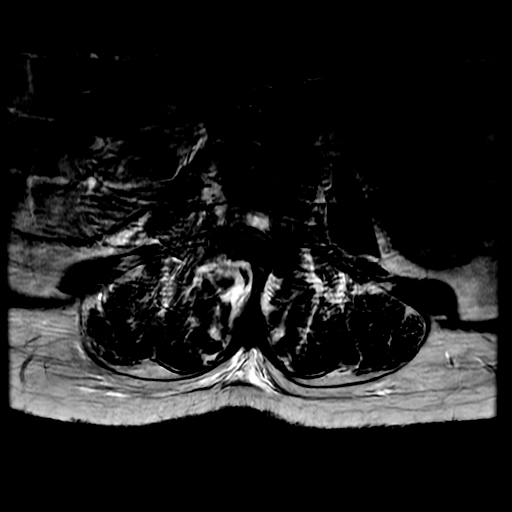
[im 32/38]
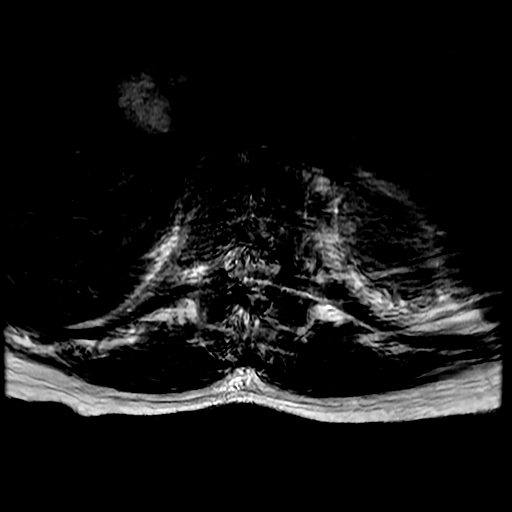

[Series 6: T1 · axial · 4.0mm · 0.39mm/px · z∈[-88,+42]mm · 3 of 38 slices shown (2 of 2)]
[im 6/38]
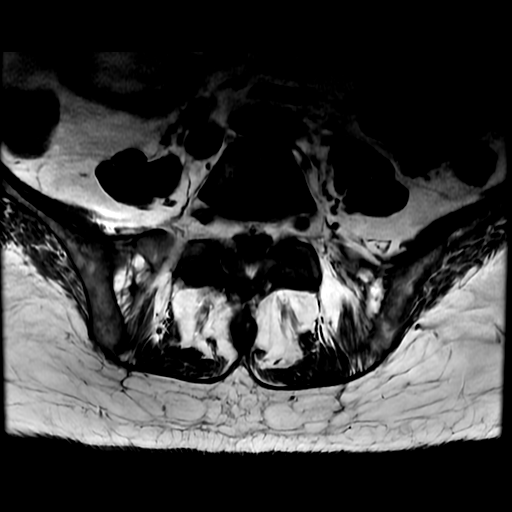
[im 19/38]
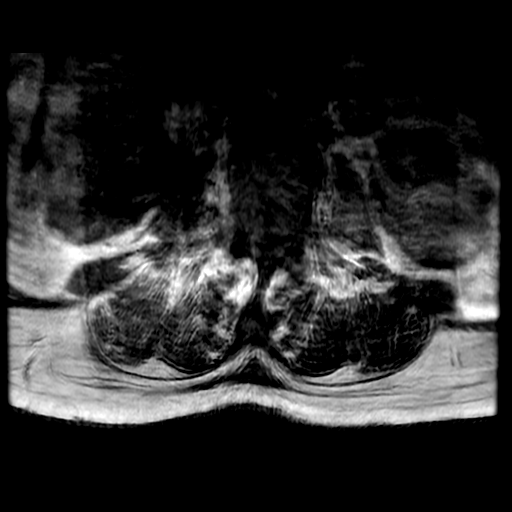
[im 32/38]
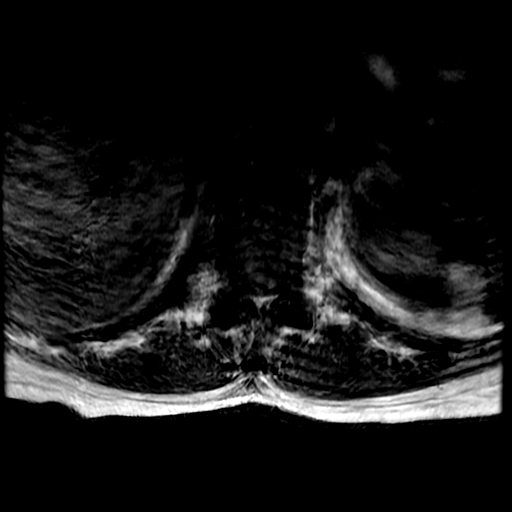

[18 of 48 positions shown; findings below may reference images not displayed]

FINDINGS: Segmentation: Normal, concordant with the thoracic spine numbering
today.

Alignment: Stable lumbar lordosis, subtle new retrolisthesis of L2
on L3.

Vertebrae: Confluent vertebral body edema at L2 and L3 with
intervening abnormal disc space with fluid and endplate erosions.
The posterior elements of these levels appear relatively spared
although there is abnormal facet joint fluid bilaterally, which is
not seen at the adjacent spinal levels.

No other marrow edema or evidence of acute osseous abnormality.
Intact visible sacrum and SI joints.

Conus medullaris and cauda equina: Highly abnormal as described on
the thoracic MRI today. Axial images on this exam are also degraded
by motion, but the abnormal cord can be seen displaced dorsally
beginning at the T11 level (series 3, image 7). The conus medullaris
is difficult to delineate. And there is ventral (lower thoracic) and
complete (L1 and lower) filling of the spinal canal by
multiloculated appearing T2 and STIR hyperintense material which is
likely abscess. Severe spinal stenosis is present from L1 through
L4. Possibly more normal appearance of the thecal sac beginning at
L5, although the cauda equina nerve roots there are difficult to
delineate.

Paraspinal and other soft tissues: Positive for lateral paraspinal
abscesses beginning at the T12 level and maximal at L1-L2-greater on
the right (series 3, image 3). The individual paraspinal abscesses
are up to 24 mm. Associated edema in the bilateral upper lumbar
psoas muscles, which become more normal by the L4 level.

Grossly negative visible abdominal viscera.

Posteriorly there is bilateral erector spinae muscle edema, although
no posterior paraspinal abscess.

Disc levels:

Outside of L1-L2, the intervertebral discs appear normal except for
chronic degeneration at L3-L4 and L4-L5. There is superimposed
degenerative bilateral lumbar facet hypertrophy, maximal at L4-L5
where facet joint fluid is also noted.
IMPRESSION: 1. Complicated L1-L2 Discitis Osteomyelitis with Severe Intraspinal
Abscess, including involvement of the lower thoracic spinal cord as
detailed on Thoracic MRI separately. Septic arthritis of the L1-L2
facets is also possible.

2. Abscess related severe lower thoracic and lumbar spinal stenosis
- down to at least the L4 level.

3. Superimposed lateral paraspinal soft tissue abscesses, greater on
the right maximal at L1-L2 and individually up to 2.4 cm.

Salient findings discussed by telephone with Dr. ARSID in

## 2020-04-23 SURGERY — LUMBAR LAMINECTOMY/DECOMPRESSION MICRODISCECTOMY 1 LEVEL
Anesthesia: General | Site: Back

## 2020-04-23 MED ORDER — POTASSIUM CHLORIDE 10 MEQ/100ML IV SOLN
10.0000 meq | Freq: Once | INTRAVENOUS | Status: AC
  Administered 2020-04-23: 10 meq via INTRAVENOUS
  Filled 2020-04-23: qty 100

## 2020-04-23 MED ORDER — KETAMINE HCL 10 MG/ML IJ SOLN
INTRAMUSCULAR | Status: DC | PRN
  Administered 2020-04-23: 30 mg via INTRAVENOUS
  Administered 2020-04-23: 20 mg via INTRAVENOUS

## 2020-04-23 MED ORDER — PHENYLEPHRINE 40 MCG/ML (10ML) SYRINGE FOR IV PUSH (FOR BLOOD PRESSURE SUPPORT)
PREFILLED_SYRINGE | INTRAVENOUS | Status: AC
  Filled 2020-04-23: qty 10

## 2020-04-23 MED ORDER — FENTANYL CITRATE (PF) 250 MCG/5ML IJ SOLN
INTRAMUSCULAR | Status: AC
  Filled 2020-04-23: qty 5

## 2020-04-23 MED ORDER — FENTANYL CITRATE (PF) 250 MCG/5ML IJ SOLN
INTRAMUSCULAR | Status: DC | PRN
  Administered 2020-04-23: 50 ug via INTRAVENOUS
  Administered 2020-04-23 (×2): 100 ug via INTRAVENOUS

## 2020-04-23 MED ORDER — LIDOCAINE 2% (20 MG/ML) 5 ML SYRINGE
INTRAMUSCULAR | Status: DC | PRN
  Administered 2020-04-23: 60 mg via INTRAVENOUS

## 2020-04-23 MED ORDER — POTASSIUM CHLORIDE 10 MEQ/100ML IV SOLN
10.0000 meq | INTRAVENOUS | Status: DC
  Administered 2020-04-23 – 2020-04-24 (×5): 10 meq via INTRAVENOUS
  Filled 2020-04-23 (×5): qty 100

## 2020-04-23 MED ORDER — PHENYLEPHRINE 40 MCG/ML (10ML) SYRINGE FOR IV PUSH (FOR BLOOD PRESSURE SUPPORT)
PREFILLED_SYRINGE | INTRAVENOUS | Status: DC | PRN
  Administered 2020-04-23 (×4): 80 ug via INTRAVENOUS

## 2020-04-23 MED ORDER — THROMBIN 20000 UNITS EX SOLR
CUTANEOUS | Status: DC | PRN
  Administered 2020-04-23: 20 mL via TOPICAL

## 2020-04-23 MED ORDER — DIPHENHYDRAMINE HCL 50 MG/ML IJ SOLN
INTRAMUSCULAR | Status: DC | PRN
  Administered 2020-04-23: 6.25 mg via INTRAVENOUS

## 2020-04-23 MED ORDER — PIPERACILLIN-TAZOBACTAM 3.375 G IVPB 30 MIN
3.3750 g | Freq: Once | INTRAVENOUS | Status: AC
  Administered 2020-04-23: 3.375 g via INTRAVENOUS
  Filled 2020-04-23: qty 50

## 2020-04-23 MED ORDER — THROMBIN 20000 UNITS EX SOLR
CUTANEOUS | Status: AC
  Filled 2020-04-23: qty 20000

## 2020-04-23 MED ORDER — POTASSIUM CHLORIDE CRYS ER 20 MEQ PO TBCR
40.0000 meq | EXTENDED_RELEASE_TABLET | Freq: Once | ORAL | Status: AC
  Administered 2020-04-23: 40 meq via ORAL
  Filled 2020-04-23: qty 2

## 2020-04-23 MED ORDER — SODIUM CHLORIDE 0.9% FLUSH: 3.0000 mL | Freq: Once | INTRAVENOUS | Status: DC

## 2020-04-23 MED ORDER — LABETALOL HCL 5 MG/ML IV SOLN
INTRAVENOUS | Status: AC
  Filled 2020-04-23: qty 4

## 2020-04-23 MED ORDER — ACETAMINOPHEN 10 MG/ML IV SOLN
INTRAVENOUS | Status: AC
  Filled 2020-04-23: qty 100

## 2020-04-23 MED ORDER — POTASSIUM CHLORIDE 10 MEQ/100ML IV SOLN: 10.0000 meq | Freq: Once | INTRAVENOUS | Status: DC

## 2020-04-23 MED ORDER — THROMBIN 5000 UNITS EX SOLR
OROMUCOSAL | Status: DC | PRN
  Administered 2020-04-23: 5 mL via TOPICAL

## 2020-04-23 MED ORDER — VANCOMYCIN HCL IN DEXTROSE 1-5 GM/200ML-% IV SOLN
INTRAVENOUS | Status: AC
  Filled 2020-04-23: qty 200

## 2020-04-23 MED ORDER — FENTANYL CITRATE (PF) 100 MCG/2ML IJ SOLN
50.0000 ug | Freq: Once | INTRAMUSCULAR | Status: AC
  Administered 2020-04-23: 50 ug via INTRAVENOUS
  Filled 2020-04-23: qty 2

## 2020-04-23 MED ORDER — HYDROMORPHONE HCL 1 MG/ML IJ SOLN
INTRAMUSCULAR | Status: AC
  Filled 2020-04-23: qty 0.5

## 2020-04-23 MED ORDER — PROPOFOL 10 MG/ML IV BOLUS
INTRAVENOUS | Status: DC | PRN
  Administered 2020-04-23: 120 mg via INTRAVENOUS

## 2020-04-23 MED ORDER — KETAMINE HCL 50 MG/5ML IJ SOSY
PREFILLED_SYRINGE | INTRAMUSCULAR | Status: AC
  Filled 2020-04-23: qty 5

## 2020-04-23 MED ORDER — SUCCINYLCHOLINE CHLORIDE 200 MG/10ML IV SOSY
PREFILLED_SYRINGE | INTRAVENOUS | Status: DC | PRN
  Administered 2020-04-23: 100 mg via INTRAVENOUS

## 2020-04-23 MED ORDER — HYDROMORPHONE HCL 1 MG/ML IJ SOLN
1.0000 mg | Freq: Once | INTRAMUSCULAR | Status: AC
  Administered 2020-04-23: 1 mg via INTRAVENOUS
  Filled 2020-04-23: qty 1

## 2020-04-23 MED ORDER — ROCURONIUM BROMIDE 10 MG/ML (PF) SYRINGE
PREFILLED_SYRINGE | INTRAVENOUS | Status: AC
  Filled 2020-04-23: qty 10

## 2020-04-23 MED ORDER — DEXAMETHASONE SODIUM PHOSPHATE 10 MG/ML IJ SOLN
INTRAMUSCULAR | Status: AC
  Filled 2020-04-23: qty 1

## 2020-04-23 MED ORDER — PHENYLEPHRINE HCL-NACL 10-0.9 MG/250ML-% IV SOLN
INTRAVENOUS | Status: DC | PRN
  Administered 2020-04-23: 20 ug/min via INTRAVENOUS

## 2020-04-23 MED ORDER — ACETAMINOPHEN 10 MG/ML IV SOLN
INTRAVENOUS | Status: DC | PRN
  Administered 2020-04-23: 1000 mg via INTRAVENOUS

## 2020-04-23 MED ORDER — 0.9 % SODIUM CHLORIDE (POUR BTL) OPTIME
TOPICAL | Status: DC | PRN
  Administered 2020-04-23: 1000 mL

## 2020-04-23 MED ORDER — PANTOPRAZOLE SODIUM 40 MG IV SOLR: 40.0000 mg | INTRAVENOUS | Status: DC

## 2020-04-23 MED ORDER — MAGNESIUM SULFATE 2 GM/50ML IV SOLN
2.0000 g | Freq: Once | INTRAVENOUS | Status: AC
  Administered 2020-04-23: 2 g via INTRAVENOUS
  Filled 2020-04-23: qty 50

## 2020-04-23 MED ORDER — THROMBIN 5000 UNITS EX SOLR
CUTANEOUS | Status: AC
  Filled 2020-04-23: qty 5000

## 2020-04-23 MED ORDER — POTASSIUM CHLORIDE 10 MEQ/100ML IV SOLN: 10.0000 meq | INTRAVENOUS | Status: DC

## 2020-04-23 MED ORDER — ROCURONIUM BROMIDE 10 MG/ML (PF) SYRINGE
PREFILLED_SYRINGE | INTRAVENOUS | Status: DC | PRN
  Administered 2020-04-23: 50 mg via INTRAVENOUS
  Administered 2020-04-23: 20 mg via INTRAVENOUS

## 2020-04-23 MED ORDER — INSULIN ASPART 100 UNIT/ML ~~LOC~~ SOLN: 0.0000 [IU] | SUBCUTANEOUS | Status: DC

## 2020-04-23 MED ORDER — MIDAZOLAM HCL 5 MG/5ML IJ SOLN
INTRAMUSCULAR | Status: DC | PRN
  Administered 2020-04-23: 2 mg via INTRAVENOUS

## 2020-04-23 MED ORDER — PROPOFOL 10 MG/ML IV BOLUS
INTRAVENOUS | Status: AC
  Filled 2020-04-23: qty 20

## 2020-04-23 MED ORDER — VANCOMYCIN HCL 1250 MG/250ML IV SOLN
1250.0000 mg | Freq: Once | INTRAVENOUS | Status: AC
  Administered 2020-04-23: 1250 mg via INTRAVENOUS
  Filled 2020-04-23: qty 250

## 2020-04-23 MED ORDER — MIDAZOLAM HCL 2 MG/2ML IJ SOLN
INTRAMUSCULAR | Status: AC
  Filled 2020-04-23: qty 2

## 2020-04-23 MED ORDER — HYDROMORPHONE HCL 1 MG/ML IJ SOLN
2.0000 mg | INTRAMUSCULAR | Status: DC | PRN
  Administered 2020-04-23: 2 mg via INTRAVENOUS
  Filled 2020-04-23: qty 2

## 2020-04-23 MED ORDER — LACTATED RINGERS IV SOLN: INTRAVENOUS | Status: DC | PRN

## 2020-04-23 MED ORDER — BUPIVACAINE HCL (PF) 0.5 % IJ SOLN
INTRAMUSCULAR | Status: AC
  Filled 2020-04-23: qty 30

## 2020-04-23 SURGICAL SUPPLY — 58 items
ADH SKN CLS APL DERMABOND .7 (GAUZE/BANDAGES/DRESSINGS) ×1
APL SKNCLS STERI-STRIP NONHPOA (GAUZE/BANDAGES/DRESSINGS)
BAND INSRT 18 STRL LF DISP RB (MISCELLANEOUS) ×2
BAND RUBBER #18 3X1/16 STRL (MISCELLANEOUS) ×4 IMPLANT
BENZOIN TINCTURE PRP APPL 2/3 (GAUZE/BANDAGES/DRESSINGS) IMPLANT
BLADE CLIPPER SURG (BLADE) IMPLANT
BUR MATCHSTICK NEURO 3.0 LAGG (BURR) ×2 IMPLANT
BUR PRECISION FLUTE 5.0 (BURR) ×1 IMPLANT
CANISTER SUCT 3000ML PPV (MISCELLANEOUS) ×2 IMPLANT
CARTRIDGE OIL MAESTRO DRILL (MISCELLANEOUS) ×1 IMPLANT
COVER WAND RF STERILE (DRAPES) ×2 IMPLANT
DECANTER SPIKE VIAL GLASS SM (MISCELLANEOUS) ×2 IMPLANT
DERMABOND ADVANCED (GAUZE/BANDAGES/DRESSINGS) ×1
DERMABOND ADVANCED .7 DNX12 (GAUZE/BANDAGES/DRESSINGS) ×1 IMPLANT
DIFFUSER DRILL AIR PNEUMATIC (MISCELLANEOUS) ×2 IMPLANT
DRAPE LAPAROTOMY 100X72X124 (DRAPES) ×2 IMPLANT
DRAPE MICROSCOPE LEICA (MISCELLANEOUS) ×2 IMPLANT
DRAPE SURG 17X23 STRL (DRAPES) ×2 IMPLANT
DRSG OPSITE POSTOP 4X6 (GAUZE/BANDAGES/DRESSINGS) ×1 IMPLANT
DRSG TELFA 3X8 NADH (GAUZE/BANDAGES/DRESSINGS) ×2 IMPLANT
DURAPREP 26ML APPLICATOR (WOUND CARE) ×2 IMPLANT
ELECT REM PT RETURN 9FT ADLT (ELECTROSURGICAL) ×2
ELECTRODE REM PT RTRN 9FT ADLT (ELECTROSURGICAL) ×1 IMPLANT
GAUZE 4X4 16PLY RFD (DISPOSABLE) IMPLANT
GAUZE SPONGE 4X4 12PLY STRL (GAUZE/BANDAGES/DRESSINGS) IMPLANT
GLOVE BIO SURGEON STRL SZ7 (GLOVE) ×1 IMPLANT
GLOVE BIOGEL PI IND STRL 7.5 (GLOVE) IMPLANT
GLOVE BIOGEL PI INDICATOR 7.5 (GLOVE) ×1
GLOVE ECLIPSE 6.5 STRL STRAW (GLOVE) ×2 IMPLANT
GLOVE EXAM NITRILE XL STR (GLOVE) IMPLANT
GOWN STRL REUS W/ TWL LRG LVL3 (GOWN DISPOSABLE) ×2 IMPLANT
GOWN STRL REUS W/ TWL XL LVL3 (GOWN DISPOSABLE) IMPLANT
GOWN STRL REUS W/TWL 2XL LVL3 (GOWN DISPOSABLE) IMPLANT
GOWN STRL REUS W/TWL LRG LVL3 (GOWN DISPOSABLE) ×4
GOWN STRL REUS W/TWL XL LVL3 (GOWN DISPOSABLE)
KIT BASIN OR (CUSTOM PROCEDURE TRAY) ×2 IMPLANT
KIT TURNOVER KIT B (KITS) ×2 IMPLANT
NDL HYPO 25X1 1.5 SAFETY (NEEDLE) ×1 IMPLANT
NDL SPNL 18GX3.5 QUINCKE PK (NEEDLE) IMPLANT
NEEDLE HYPO 25X1 1.5 SAFETY (NEEDLE) ×2 IMPLANT
NEEDLE SPNL 18GX3.5 QUINCKE PK (NEEDLE) ×2 IMPLANT
NS IRRIG 1000ML POUR BTL (IV SOLUTION) ×2 IMPLANT
OIL CARTRIDGE MAESTRO DRILL (MISCELLANEOUS) ×2
PACK LAMINECTOMY NEURO (CUSTOM PROCEDURE TRAY) ×2 IMPLANT
PAD ARMBOARD 7.5X6 YLW CONV (MISCELLANEOUS) ×6 IMPLANT
PAD DRESSING TELFA 3X8 NADH (GAUZE/BANDAGES/DRESSINGS) IMPLANT
SPONGE LAP 4X18 RFD (DISPOSABLE) IMPLANT
SPONGE SURGIFOAM ABS GEL SZ50 (HEMOSTASIS) ×2 IMPLANT
STRIP CLOSURE SKIN 1/2X4 (GAUZE/BANDAGES/DRESSINGS) IMPLANT
SUT VIC AB 0 CT1 18XCR BRD8 (SUTURE) ×1 IMPLANT
SUT VIC AB 0 CT1 8-18 (SUTURE) ×2
SUT VIC AB 2-0 CT1 18 (SUTURE) ×2 IMPLANT
SUT VIC AB 3-0 SH 8-18 (SUTURE) ×2 IMPLANT
SYR TOOMEY 50ML (SYRINGE) ×1 IMPLANT
TOWEL GREEN STERILE (TOWEL DISPOSABLE) ×2 IMPLANT
TOWEL GREEN STERILE FF (TOWEL DISPOSABLE) ×2 IMPLANT
TUBE FEEDING ENTERAL 5FR 16IN (TUBING) ×1 IMPLANT
WATER STERILE IRR 1000ML POUR (IV SOLUTION) ×2 IMPLANT

## 2020-04-23 NOTE — Anesthesia Preprocedure Evaluation (Addendum)
Anesthesia Evaluation  Patient identified by MRN, date of birth, ID band Patient awake    Reviewed: Allergy & Precautions, H&P , NPO status , Patient's Chart, lab work & pertinent test results  Airway Mallampati: II  TM Distance: >3 FB Neck ROM: Full    Dental no notable dental hx. (+) Poor Dentition, Dental Advisory Given   Pulmonary Current SmokerPatient did not abstain from smoking.,    Pulmonary exam normal breath sounds clear to auscultation       Cardiovascular negative cardio ROS   Rhythm:Regular Rate:Normal     Neuro/Psych negative neurological ROS  negative psych ROS   GI/Hepatic negative GI ROS, Neg liver ROS,   Endo/Other  negative endocrine ROS  Renal/GU negative Renal ROS  negative genitourinary   Musculoskeletal   Abdominal   Peds  Hematology negative hematology ROS (+)   Anesthesia Other Findings   Reproductive/Obstetrics negative OB ROS                            Anesthesia Physical Anesthesia Plan  ASA: II and emergent  Anesthesia Plan: General   Post-op Pain Management:    Induction: Intravenous, Rapid sequence and Cricoid pressure planned  PONV Risk Score and Plan: 3 and Ondansetron, Dexamethasone and Midazolam  Airway Management Planned: Oral ETT  Additional Equipment: CVP and Ultrasound Guidance Line Placement  Intra-op Plan:   Post-operative Plan: Extubation in OR  Informed Consent: I have reviewed the patients History and Physical, chart, labs and discussed the procedure including the risks, benefits and alternatives for the proposed anesthesia with the patient or authorized representative who has indicated his/her understanding and acceptance.     Dental advisory given  Plan Discussed with: CRNA  Anesthesia Plan Comments:        Anesthesia Quick Evaluation

## 2020-04-23 NOTE — Consult Note (Signed)
NAME:  Debra Schmidt, MRN:  [id number], DOB:  02-03-1961, LOS: 0 ADMISSION DATE:  04/23/2020, CONSULTATION DATE:  04/23/20 REFERRING MD:  Franky Macho, CHIEF COMPLAINT:  Back pain   Brief History   59 year old female with past medical history of remote drug use (xanax, hydrocodone) who presents with 3 weeks of lower back pain which progressed to urinary retention and lower extremity weakness and swelling.  Found to have severe intraspinal abscess including involvement of the lower thoracic spinal cord.  Neurosurgery taken to the OR for emergent Thoracolumbar decompression and to ICU post-op for monitoring.  History of present illness   Debra Schmidt is a 59 y.o. F with PMH of prior drug and alcohol use, denies any recent use and states last drug abuse was 5yrs ago who presented with a chief complaint of lower back pain, urinary retention, lower extremity weakness and swelling.   She states she fell in the shower several weeks ago and hit her right shoulder causing an abrasion that now has purulent drainage.  She denies any back injury from that fall, however did develop gradually worsening low back pain shortly after with urinary retention and overflow incontinence with tingling in the bilateral legs, weakness and pedal edema.  This worsened over the last several days so presented to the emergency department.  She denies any fevers, nausea, vomiting chills or stool incontinence, though has been constipated.    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  Labs show K 2.0, no leukocytosis. Mag 1.6.  She has been afebrile and hypertensive.  She was given K po and K IV, and 2g Magnesium and Neurosurgery evaluated.  Pt will go to the OR for emergent Thoracolumbar  decompression and then to the ICU post-op for monitoring.   Past Medical History  History reviewed. No pertinent past medical history.  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 rate 18, height 5\' 4"  (1.626 m), weight 62.6 kg, SpO2 100 %.        Intake/Output Summary (Last 24 hours) at 04/23/2020 2109 Last data filed at 04/23/2020 1949 Gross per 24 hour  Intake 150 ml  Output 1300 ml  Net -1150 ml   Filed Weights   04/23/20 1332  Weight: 62.6 kg    General:  Poorly nourished F, moaning in pain HEENT: MM pink/moist, very poor denitition Neuro: awake and alert, following commands, LE sensation intact, lower extremity weakness R>L CV: s1s2 rrr, no m/r/g PULM:  CTAB GI: soft, bsx4 active  Extremities: warm/dry, 2+ pedal edema  Skin: superior R shoulder lesion and draining abscess, no reduced ROM in the joint   Resolved Hospital Problem list     Assessment &  Plan:   Extensive Intraspinal abscess and cord compression Neurosurgery taking emergently to the OR for decompression  -Intubated preoperatively -Post-op monitoring in ICU -Broad empiric antibiotic coverage with vancomycin and Zosyn, serial blood cultures for the next 2 days,  infectious disease has been consulted -Does not appear septic or bacteremic at this time -denies recent IVDA, check Urine drug screen   Severe Hypokalemia, hypomagnesemia  Not on diuretics, renal function is normal, unclear source though possibly poor dietary intake -Received po and IV K along with Mag 2g IV in the ED -repeat level overnight and continue to replete per protocol   Elevated BP Pt has not been diagnosed with hypertension in the past and takes no medication, may be pain related   R shoulder cellulitis/abscess Pt reports skin tear after falling in the shower several weeks ago, spontaneously draining without decreased ROM in the R shoulder, ? Possible source of hematogenous seeding -Covered with Vanc/Zosyn, check plain film to eval for subcutaneous gas  Best practice:  Diet: NPO Pain/Anxiety/Delirium protocol (if indicated): Dilaudid VAP protocol (if indicated): HOB 30 degrees, suction prn DVT prophylaxis: SCD's GI prophylaxis: protonix Glucose control: SSI Mobility: bed rest Code Status: full code Family Communication: per primary Disposition: ICU  Labs   CBC: Recent Labs  Lab 04/23/20 1339 04/23/20 2056  WBC 9.1  --   HGB 10.4* 10.2*  HCT 32.2* 30.0*  MCV 84.7  --   PLT 407*  --     Basic Metabolic Panel: Recent Labs  Lab 04/23/20 1339 04/23/20 2056  NA 140 143  K 2.0* 2.0*  CL 97* 99  CO2 27  --   GLUCOSE 143* 157*  BUN 5* 3*  CREATININE 0.79 0.60  CALCIUM 9.4  --   MG 1.6*  --    GFR: Estimated Creatinine Clearance: 65.4 mL/min (by C-G formula based on SCr of 0.6 mg/dL). Recent Labs  Lab 04/23/20 1339  WBC 9.1    Liver Function Tests: Recent Labs  Lab 04/23/20 1339  AST 19  ALT 13  ALKPHOS 74  BILITOT 0.6  PROT 7.7  ALBUMIN 2.4*   Recent Labs  Lab 04/23/20 1339  LIPASE 25   No results for input(s): AMMONIA in the last 168 hours.  ABG    Component Value Date/Time   TCO2 29 04/23/2020 2056      Coagulation Profile: No results for input(s): INR, PROTIME in the last 168 hours.  Cardiac Enzymes: No results for input(s): CKTOTAL, CKMB, CKMBINDEX, TROPONINI in the last 168 hours.  HbA1C: No results found for: HGBA1C  CBG: No results for input(s): GLUCAP in the last 168 hours.  Review of Systems:   Negative except as noted in HPI  Past Medical History  She,  has no past medical history on file.   Surgical History   History reviewed. No pertinent surgical history.   Social History   reports that she has been smoking cigarettes. She has been smoking about 0.50 packs per day. She has never used smokeless tobacco. She reports previous alcohol use. She reports previous drug use.   Family History   Her family history is not on file.   Allergies Allergies  Allergen Reactions  . Penicillins Hives     Home Medications  Prior to Admission medications   Medication Sig Start Date End Date Taking? Authorizing Provider  ibuprofen (ADVIL) 200 MG tablet Take 200-800 mg by mouth every 6 (six) hours as needed for moderate pain.   Yes [provider]  phenazopyridine (PYRIDIUM) 95 MG tablet Take 95 mg by mouth 3 (three) times daily as needed for pain.   Yes [provider]  docusate sodium (STOOL SOFTENER) 100 MG capsule Take 100 mg by mouth daily as needed for mild constipation. Patient not taking: Reported on 04/23/2020    [provider]  methocarbamol (ROBAXIN) 500 MG tablet Take 1 tablet (500 mg total) by mouth every 8 (eight) hours as needed for muscle spasms. Patient not taking: Reported on 04/23/2020 02/25/20   Davonna Belling, MD  potassium chloride SA (KLOR-CON) 20 MEQ tablet Take 1 tablet (20 mEq total) by mouth 2 (two) times daily. Patient not taking: Reported on 04/23/2020 02/25/20   Davonna Belling, MD     Critical care time: 50 minutes    CRITICAL CARE Performed by: Otilio Carpen Ranny Wiebelhaus   Total critical care time: 50 minutes  Critical  care time was exclusive of separately billable procedures and treating other patients.  Critical care was necessary to treat or prevent imminent or life-threatening deterioration.  Critical care was time spent personally by me on the following activities: development of treatment plan with patient and/or surrogate as well as nursing, discussions with consultants, evaluation of patient's response to treatment, examination of patient, obtaining history from patient or surrogate, ordering and performing treatments and interventions, ordering and review of laboratory studies, ordering and review of radiographic studies, pulse oximetry and re-evaluation of patient's condition.   Otilio Carpen Kaydance Bowie, PA-C

## 2020-04-23 NOTE — ED Provider Notes (Signed)
[facility] EMERGENCY DEPARTMENT Provider Note   CSN: [id number] Arrival date & time: 04/23/20  1329     History Chief Complaint  Patient presents with  . Abdominal Pain    Debra Schmidt is a 59 y.o. female.  Patient is a 59 year old female who fell approximately 3 weeks ago presented to ED with urinary retention and loss of bladder function, bilateral lower extremity weakness, swelling, severe suprapubic pain, saddle anesthesia.  Patient denies fevers chills, low back pain.  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  Patient last urinated normally on Thursday or Friday morning.   The history is provided by the patient and medical records.  Illness Location:  Suprapubic region Quality:  Pain and distention Severity:  Severe Onset quality:  Gradual Timing:  Constant Progression:  Worsening Chronicity:  New Context:  Patient fell 3 weeks ago and has had acute worsening symptoms over the last 3 to 4 days Relieved by:  Nothing Worsened by:  None tried Ineffective treatments:  Nothing Associated symptoms: abdominal pain and fatigue   Associated symptoms: no chest pain, no congestion, no cough, no fever, no headaches, no loss of consciousness, no nausea, no shortness of breath and no vomiting        History reviewed. No pertinent past medical history.  There are no problems to display for this patient.   History reviewed. No pertinent surgical history.   OB History   No obstetric history on file.     No family history on file.  Social History   Tobacco Use  . Smoking status: Current Every Day Smoker    Packs/day: 0.50    Types: Cigarettes  . Smokeless tobacco: Never Used  Substance Use Topics  . Alcohol use: Not Currently  . Drug use: Not Currently    Home Medications Prior to Admission medications   Medication Sig Start Date End Date Taking? Authorizing Provider  ibuprofen (ADVIL) 200 MG tablet Take 200-800 mg by  mouth every 6 (six) hours as needed for moderate pain.   Yes [provider]  phenazopyridine (PYRIDIUM) 95 MG tablet Take 95 mg by mouth 3 (three) times daily as needed for pain.   Yes [provider]  docusate sodium (STOOL SOFTENER) 100 MG capsule Take 100 mg by mouth daily as needed for mild constipation. Patient not taking: Reported on 04/23/2020    [provider]  methocarbamol (ROBAXIN) 500 MG tablet Take 1 tablet (500 mg total) by mouth every 8 (eight) hours as needed for muscle spasms. Patient not taking: Reported on 04/23/2020 02/25/20   Benjiman Core, MD  potassium chloride SA (KLOR-CON) 20 MEQ tablet Take 1 tablet (20 mEq total) by mouth 2 (two) times daily. Patient not taking: Reported on 04/23/2020 02/25/20   Benjiman Core, MD    Allergies    Penicillins  Review of Systems   Review of Systems  Constitutional: Positive for fatigue. Negative for fever.  HENT: Negative for congestion.   Respiratory: Negative for cough and shortness of breath.   Cardiovascular: Positive for leg swelling. Negative for chest pain.  Gastrointestinal: Positive for abdominal pain. Negative for nausea and vomiting.  Genitourinary: Positive for difficulty urinating and dysuria.  Neurological: Negative for loss of consciousness and headaches.  All other systems reviewed and are negative.   Physical Exam Updated Vital Signs BP (!) 154/95   Pulse (!) 101   Temp 98.7 F (37.1 C) (Oral)   Resp 18  Ht 5\' 4"  (1.626 m)   Wt 62.6 kg   SpO2 100%   BMI 23.69 kg/m   Physical Exam Vitals and nursing note reviewed.  Constitutional:      General: She is not in acute distress.    Appearance: She is well-developed.    HENT:     Head: Normocephalic and atraumatic.  Eyes:     Conjunctiva/sclera: Conjunctivae normal.  Cardiovascular:     Rate and Rhythm: Normal rate and regular rhythm.     Heart sounds: No murmur heard.   Pulmonary:     Effort: Pulmonary effort is  normal. No respiratory distress.     Breath sounds: Normal breath sounds.  Abdominal:     General: There is distension.     Palpations: Abdomen is soft.     Tenderness: There is abdominal tenderness in the suprapubic area.  Genitourinary:    Rectum: No tenderness. Abnormal anal tone.     Comments: Patient rectal exam, no gross blood.  Minimal to no rectal tone.  Significant decrease in sensation. Musculoskeletal:     Cervical back: Neck supple.  Skin:    General: Skin is warm and dry.  Neurological:     Mental Status: She is alert.     GCS: GCS eye subscore is 4. GCS verbal subscore is 5. GCS motor subscore is 6.     Cranial Nerves: Cranial nerves are intact.     Motor: Weakness present. No abnormal muscle tone.     Gait: Gait abnormal.  Psychiatric:        Mood and Affect: Mood normal.        Behavior: Behavior normal.     ED Results / Procedures / Treatments   Labs (all labs ordered are listed, but only abnormal results are displayed) Labs Reviewed  COMPREHENSIVE METABOLIC PANEL - Abnormal; Notable for the following components:      Result Value   Potassium 2.0 (*)    Chloride 97 (*)    Glucose, Bld 143 (*)    BUN 5 (*)    Albumin 2.4 (*)    Anion gap 16 (*)    All other components within normal limits  CBC - Abnormal; Notable for the following components:   RBC 3.80 (*)    Hemoglobin 10.4 (*)    HCT 32.2 (*)    Platelets 407 (*)    All other components within normal limits  BRAIN NATRIURETIC PEPTIDE - Abnormal; Notable for the following components:   B Natriuretic Peptide 275.3 (*)    All other components within normal limits  MAGNESIUM - Abnormal; Notable for the following components:   Magnesium 1.6 (*)    All other components within normal limits  I-STAT CHEM 8, ED - Abnormal; Notable for the following components:   Potassium 2.0 (*)    BUN 3 (*)    Glucose, Bld 157 (*)    Hemoglobin 10.2 (*)    HCT 30.0 (*)    All other components within normal limits    SARS CORONAVIRUS 2 BY RT PCR (HOSPITAL ORDER, PERFORMED IN  HOSPITAL LAB)  CULTURE, BLOOD (ROUTINE X 2)  CULTURE, BLOOD (ROUTINE X 2)  LIPASE, BLOOD  URINALYSIS, ROUTINE W REFLEX MICROSCOPIC  BASIC METABOLIC PANEL  RAPID URINE DRUG SCREEN, HOSP PERFORMED    EKG None  Radiology MR THORACIC SPINE WO CONTRAST  Addendum Date: 04/23/2020   ADDENDUM REPORT: 04/23/2020 18:36 ADDENDUM: Critical findings discussed by telephone with Dr. 04/25/2020 on 04/23/2020  at 18:26. Electronically Signed   By: Genevie Ann M.D.   On: 04/23/2020 18:36   Result Date: 04/23/2020 CLINICAL DATA:  59 year old female with low back pain, urinary incontinence. EXAM: MRI THORACIC SPINE WITHOUT CONTRAST TECHNIQUE: Multiplanar, multisequence MR imaging of the thoracic spine was performed. No intravenous contrast was administered. COMPARISON:  Lumbar MRI today reported separately. CT Abdomen and Pelvis 02/24/2020. FINDINGS: Limited cervical spine imaging:  Negative. Thoracic spine segmentation: Appears to be normal, and concordant with the lumbar numbering today. Alignment: Mildly exaggerated thoracic kyphosis. No spondylolisthesis. Vertebrae: No acute osseous abnormality identified in the thoracic spine, thoracic marrow signal appears to remain normal. Abnormal L1 and L2 levels, detailed separately today. Cord:  Axial images degraded by motion. Abnormal thoracic spinal cord T2 and STIR hyperintensity within the central cord beginning at the T8 vertebral level (series 5, image 12, series 8, image 31) and continuing to the cauda equina. Mildly abnormal central cord signal may extend as high as the T4 level, but above that the cord appears within normal limits. Superimposed bulky abnormal intraspinal T2 and STIR hyperintense signal in the lower thoracic spinal canal beginning at T10-T11 (series 6, image 10) and continuing into the lumbar spinal canal. Axial images through the segments are degraded by motion but the cord  appears primarily displaced and compressed posteriorly by the bulky fluid signal space-occupying mass (series 12, image 29). The dorsal thoracic epidural space is probably abnormal as high as the T9 level. Paraspinal and other soft tissues: The lower thoracic paraspinal soft tissues become abnormal at the T11-T12 level, with STIR hyperintensity compatible with inflammation. Abnormality continues into the lumbar spine reported separately. Grossly negative visible thoracic and upper abdominal viscera. Disc levels: Mild thoracic spine degeneration with the exception of T8-T9 where disc desiccation and disc space loss are accompanied by disc bulging. No spinal or foraminal stenosis associated. IMPRESSION: 1. Positive for compression of the lower thoracic spinal cord and conus by what appears to be a bulky multiloculated intraspinal fluid collection emanating from the abnormal lumbar L1-L2 level (detailed separately). 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 2. Severe abnormal signal in the spinal cord - especially from T8 inferiorly - could be cord edema from compression, but consider also Venous Cord Infarction from septic spinal venous thrombophlebitis. 3. Recommend Neurosurgery, Neurology, and Infectious Disease consultation. Electronically Signed: By: Genevie Ann M.D. On: 04/23/2020 18:20   MR LUMBAR SPINE WO CONTRAST  Result Date: 04/23/2020 CLINICAL DATA:  59 year old female with low back pain, urinary incontinence. EXAM: MRI LUMBAR SPINE WITHOUT CONTRAST TECHNIQUE: Multiplanar, multisequence MR imaging of the lumbar spine was performed. No intravenous contrast was administered. COMPARISON:  CT Abdomen and Pelvis 03/25/2020. Thoracic spine MRI today reported separately. FINDINGS: Segmentation: Normal, concordant with the thoracic spine numbering today. Alignment: Stable lumbar lordosis, subtle new retrolisthesis of L2 on L3. Vertebrae: Confluent vertebral body edema at L2 and L3  with intervening abnormal disc space with fluid and endplate erosions. The posterior elements of these levels appear relatively spared although there is abnormal facet joint fluid bilaterally, which is not seen at the adjacent spinal levels. No other marrow edema or evidence of acute osseous abnormality. Intact visible sacrum and SI joints. Conus medullaris and cauda equina: Highly abnormal as described on the thoracic MRI today. Axial images on this exam are also degraded by motion, but the abnormal cord can be seen displaced dorsally beginning at the T11 level (series 3, image 7). The conus medullaris is difficult to delineate.  And there is ventral (lower thoracic) and complete (L1 and lower) filling of the spinal canal by multiloculated appearing T2 and STIR hyperintense material which is likely abscess. Severe spinal stenosis is present from L1 through L4. Possibly more normal appearance of the thecal sac beginning at L5, although the cauda equina nerve roots there are difficult to delineate. Paraspinal and other soft tissues: Positive for lateral paraspinal abscesses beginning at the T12 level and maximal at L1-L2-greater on the right (series 3, image 3). The individual paraspinal abscesses are up to 24 mm. Associated edema in the bilateral upper lumbar psoas muscles, which become more normal by the L4 level. Grossly negative visible abdominal viscera. Posteriorly there is bilateral erector spinae muscle edema, although no posterior paraspinal abscess. Disc levels: Outside of L1-L2, the intervertebral discs appear normal except for chronic degeneration at L3-L4 and L4-L5. There is superimposed degenerative bilateral lumbar facet hypertrophy, maximal at L4-L5 where facet joint fluid is also noted. IMPRESSION: 1. 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 Septic arthritis of the L1-L2  facets is also possible. 2. 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 3. Superimposed lateral paraspinal soft tissue abscesses, greater on the right maximal at L1-L2 and individually up to 2.4 cm. Salient findings discussed by telephone with Dr. Gwyneth SproutWHITNEY PLUNKETT in the ED on 04/23/2020 at 18:28 . Electronically Signed   By: Odessa FlemingH  Hall M.D.   On: 04/23/2020 18:34    Procedures Procedures (including critical care time)  Medications Ordered in ED Medications  sodium chloride flush (NS) 0.9 % injection 3 mL (3 mLs Intravenous Not Given 04/23/20 1615)  potassium chloride 10 mEq in 100 mL IVPB (has no administration in time range)  HYDROmorphone (DILAUDID) injection 2 mg (2 mg Intravenous Given 04/23/20 2057)  potassium chloride 10 mEq in 100 mL IVPB (0 mEq Intravenous Stopped 04/23/20 1846)  potassium chloride SA (KLOR-CON) CR tablet 40 mEq (40 mEq Oral Given 04/23/20 1550)  fentaNYL (SUBLIMAZE) injection 50 mcg (50 mcg Intravenous Given 04/23/20 1610)  magnesium sulfate IVPB 2 g 50 mL (0 g Intravenous Stopped 04/23/20 1949)  fentaNYL (SUBLIMAZE) injection 50 mcg (50 mcg Intravenous Given 04/23/20 1846)  HYDROmorphone (DILAUDID) injection 1 mg (1 mg Intravenous Given 04/23/20 1859)    ED Course  I have reviewed the triage vital signs and the nursing notes.  Pertinent labs & imaging results that were available during my care of the patient were reviewed by me and considered in my medical decision making (see chart for details).  Clinical Course as of Apr 24 2111  Wynelle LinkSun Apr 23, 2020  1537 Foley catheter placed with output of greater than 1000 mL of urine.  Resolution of patient's abdominal pain.  Patient's last bowel movement was Friday, soft and normal.  Patient denies any recent constipation.  Patient has no difficulty walking but endorses pain due to swelling in her bilateral lower extremities.   [AL]    Clinical Course User Index [AL] Janeece FittingLandsee,  Delmo Matty, MD   MDM Rules/Calculators/A&P                          Differential diagnosis: Cauda equina, spinal abscess, hypokalemia, hypomagnesemia  ED physician interpretation of imaging: MRI concerning for large epidural abscess in the lumbar spine causing mass-effect. ED physician interpretation of labs: Patient CBC without leukocytosis.  Patient Covid negative.  Patient's BMP with significant hyperkalemia of unknown known etiology.  K is 2.0.  40 mEq oral as well as 1 run of IV started.  Patient's magnesium 1.6 slightly low.  Repleted by IV.  Additional 3 rounds of potassium ordered while in the ED.  MDM: Patient is a 59 year old female presenting to the ED with urinary incontinence, bilateral lower extremity weakness since Thursday night or Friday morning with symptoms concerning for cauda equina, MRI obtained of the thoracic and lumbar spine showing significant spinal abscess requiring neurosurgery consultation and surgical intervention.  Patient's vital signs are stable, patient afebrile.  Patient's physical exam is remarkable for symptoms of cauda equina including decreased to no rectal tone, severe urinary retention, bilateral lower extremity weakness.  Upon MRI results, neurosurgery paged.  They evaluated the patient and took her to the OR soon as Covid test resulted.  Holding antibiotics at this time per neurosurgery.  Repeat potassium on Chem-8 still shows patient have significant hypokalemia.  Additional repletion continuing.  Diagnosis, treatment and plan of care was discussed and agreed upon with patient.  Patient comfortable with admission at this time.  Final Clinical Impression(s) / ED Diagnoses Final diagnoses:  Hypokalemia  Abscess in epidural space of lumbar spine  Hypomagnesemia    Rx / DC Orders ED Discharge Orders    None       Janeece Fitting, MD 04/23/20 2113    Gwyneth Sprout, MD 04/23/20 2336

## 2020-04-23 NOTE — ED Notes (Signed)
Potassium 20 ° °

## 2020-04-23 NOTE — Anesthesia Procedure Notes (Signed)
Procedure Name: Intubation Date/Time: 04/23/2020 9:16 PM Performed by: Zollie Scale, CRNA Pre-anesthesia Checklist: Patient identified, Emergency Drugs available, Suction available and Patient being monitored Patient Re-evaluated:Patient Re-evaluated prior to induction Oxygen Delivery Method: Circle System Utilized Preoxygenation: Pre-oxygenation with 100% oxygen Induction Type: IV induction, Rapid sequence and Cricoid Pressure applied Laryngoscope Size: Miller and 2 Grade View: Grade I Tube type: Oral Tube size: 7.5 mm Number of attempts: 1 Airway Equipment and Method: Stylet Placement Confirmation: ETT inserted through vocal cords under direct vision,  positive ETCO2 and breath sounds checked- equal and bilateral Secured at: 21 cm Tube secured with: Tape Dental Injury: Teeth and Oropharynx as per pre-operative assessment

## 2020-04-23 NOTE — ED Triage Notes (Signed)
Pt to triage via [facility] from home.  Reports urinary retention, lower abd pain, nausea, and bilateral leg weakness x 3 weeks.  Reports pedal edema that started on Friday.

## 2020-04-23 NOTE — Anesthesia Procedure Notes (Signed)
Central Venous Catheter Insertion Performed by: Gaynelle Adu, MD, anesthesiologist Start/End6/13/2021 9:35 PM, 04/23/2020 9:45 PM Patient location: Pre-op. Preanesthetic checklist: patient identified, IV checked, site marked, risks and benefits discussed, surgical consent, monitors and equipment checked, pre-op evaluation, timeout performed and anesthesia consent Position: Trendelenburg Lidocaine 1% used for infiltration and patient sedated Hand hygiene performed , maximum sterile barriers used  and Seldinger technique used Catheter size: 8 Fr Total catheter length 16. Central line was placed.Double lumen Procedure performed using ultrasound guided technique. Ultrasound Notes:anatomy identified, needle tip was noted to be adjacent to the nerve/plexus identified, no ultrasound evidence of intravascular and/or intraneural injection and image(s) printed for medical record Attempts: 1 Following insertion, dressing applied, line sutured and Biopatch. Post procedure assessment: blood return through all ports  Patient tolerated the procedure well with no immediate complications.

## 2020-04-23 NOTE — H&P (Signed)
Debra Schmidt is an 59 y.o. female.   Chief Complaint: weakness lower extremities, urinary incontinence HPI: weeks long history back pain much worse last 72 hours. Describes overflow incontinence since Friday 6/11, has been able to ambulate using a cane. Has severe spasms in lower back.  History reviewed. No pertinent past medical history.  History reviewed. No pertinent surgical history.  No family history on file. Social History:  reports that she has been smoking cigarettes. She has been smoking about 0.50 packs per day. She has never used smokeless tobacco. She reports previous alcohol use. She reports previous drug use.  Allergies:  Allergies  Allergen Reactions  . Penicillins Hives    (Not in a hospital admission)   Results for orders placed or performed during the hospital encounter of 04/23/20 (from the past 48 hour(s))  Lipase, blood     Status: None   Collection Time: 04/23/20  1:39 PM  Result Value Ref Range   Lipase 25 11 - 51 U/L    Comment: Performed at [facility], [address] [place], [place] [address]  Comprehensive metabolic panel     Status: Abnormal   Collection Time: 04/23/20  1:39 PM  Result Value Ref Range   Sodium 140 135 - 145 mmol/L   Potassium 2.0 (LL) 3.5 - 5.1 mmol/L    Comment: CRITICAL RESULT CALLED TO, READ BACK BY AND VERIFIED WITH: RN J BLUE AT 1430 04/23/20 BY L BENFIELD    Chloride 97 (L) 98 - 111 mmol/L   CO2 27 22 - 32 mmol/L   Glucose, Bld 143 (H) 70 - 99 mg/dL    Comment: Glucose reference range applies only to samples taken after fasting for at least 8 hours.   BUN 5 (L) 6 - 20 mg/dL   Creatinine, Ser 0.79 0.44 - 1.00 mg/dL   Calcium 9.4 8.9 - 10.3 mg/dL   Total Protein 7.7 6.5 - 8.1 g/dL   Albumin 2.4 (L) 3.5 - 5.0 g/dL   AST 19 15 - 41 U/L   ALT 13 0 - 44 U/L   Alkaline Phosphatase 74 38 - 126 U/L   Total Bilirubin 0.6 0.3 - 1.2 mg/dL   GFR calc non Af Amer >60 >60 mL/min   GFR calc Af Amer >60 >60 mL/min   Anion  gap 16 (H) 5 - 15    Comment: Performed at [facility] [address], [place], [place] [address]  CBC     Status: Abnormal   Collection Time: 04/23/20  1:39 PM  Result Value Ref Range   WBC 9.1 4.0 - 10.5 K/uL   RBC 3.80 (L) 3.87 - 5.11 MIL/uL   Hemoglobin 10.4 (L) 12.0 - 15.0 g/dL   HCT 32.2 (L) 36 - 46 %   MCV 84.7 80.0 - 100.0 fL   MCH 27.4 26.0 - 34.0 pg   MCHC 32.3 30.0 - 36.0 g/dL   RDW 15.2 11.5 - 15.5 %   Platelets 407 (H) 150 - 400 K/uL   nRBC 0.0 0.0 - 0.2 %    Comment: Performed at [facility] [address] [place], [place] [address]  Brain natriuretic peptide     Status: Abnormal   Collection Time: 04/23/20  1:39 PM  Result Value Ref Range   B Natriuretic Peptide 275.3 (H) 0.0 - 100.0 pg/mL    Comment: Performed at [facility] [address], [place], [place] [address]  Magnesium  Status: Abnormal   Collection Time: 04/23/20  1:39 PM  Result Value Ref Range   Magnesium 1.6 (L) 1.7 - 2.4 mg/dL    Comment: Performed at [facility], [address] [place], [place] [address]  Urinalysis, Routine w reflex microscopic     Status: None   Collection Time: 04/23/20  3:53 PM  Result Value Ref Range   Color, Urine YELLOW YELLOW   APPearance CLEAR CLEAR   Specific Gravity, Urine 1.009 1.005 - 1.030   pH 6.0 5.0 - 8.0   Glucose, UA NEGATIVE NEGATIVE mg/dL   Hgb urine dipstick NEGATIVE NEGATIVE   Bilirubin Urine NEGATIVE NEGATIVE   Ketones, ur NEGATIVE NEGATIVE mg/dL   Protein, ur NEGATIVE NEGATIVE mg/dL   Nitrite NEGATIVE NEGATIVE   Leukocytes,Ua NEGATIVE NEGATIVE    Comment: Performed at [facility], [address] [place], [place] [address]   MR THORACIC SPINE WO CONTRAST  Addendum Date: 04/23/2020   ADDENDUM REPORT: 04/23/2020 18:36 ADDENDUM: Critical findings discussed by telephone with Dr. Gwyneth Sprout on 04/23/2020 at 18:26. Electronically Signed   By: Odessa Fleming M.D.   On: 04/23/2020 18:36   Result Date:  04/23/2020 CLINICAL DATA:  59 year old female with low back pain, urinary incontinence. EXAM: MRI THORACIC SPINE WITHOUT CONTRAST TECHNIQUE: Multiplanar, multisequence MR imaging of the thoracic spine was performed. No intravenous contrast was administered. COMPARISON:  Lumbar MRI today reported separately. CT Abdomen and Pelvis 02/24/2020. FINDINGS: Limited cervical spine imaging:  Negative. Thoracic spine segmentation: Appears to be normal, and concordant with the lumbar numbering today. Alignment: Mildly exaggerated thoracic kyphosis. No spondylolisthesis. Vertebrae: No acute osseous abnormality identified in the thoracic spine, thoracic marrow signal appears to remain normal. Abnormal L1 and L2 levels, detailed separately today. Cord:  Axial images degraded by motion. Abnormal thoracic spinal cord T2 and STIR hyperintensity within the central cord beginning at the T8 vertebral level (series 5, image 12, series 8, image 31) and continuing to the cauda equina. Mildly abnormal central cord signal may extend as high as the T4 level, but above that the cord appears within normal limits. Superimposed bulky abnormal intraspinal T2 and STIR hyperintense signal in the lower thoracic spinal canal beginning at T10-T11 (series 6, image 10) and continuing into the lumbar spinal canal. Axial images through the segments are degraded by motion but the cord appears primarily displaced and compressed posteriorly by the bulky fluid signal space-occupying mass (series 12, image 29). The dorsal thoracic epidural space is probably abnormal as high as the T9 level. Paraspinal and other soft tissues: The lower thoracic paraspinal soft tissues become abnormal at the T11-T12 level, with STIR hyperintensity compatible with inflammation. Abnormality continues into the lumbar spine reported separately. Grossly negative visible thoracic and upper abdominal viscera. Disc levels: Mild thoracic spine degeneration with the exception of T8-T9  where disc desiccation and disc space loss are accompanied by disc bulging. No spinal or foraminal stenosis associated. IMPRESSION: 1. Positive for compression of the lower thoracic spinal cord and conus by what appears to be a bulky multiloculated intraspinal fluid collection emanating from the abnormal lumbar L1-L2 level (detailed separately). 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 2. Severe abnormal signal in the spinal cord - especially from T8 inferiorly - could be cord edema from compression, but consider also Venous Cord Infarction from septic spinal venous thrombophlebitis. 3. Recommend Neurosurgery, Neurology, and Infectious Disease consultation. Electronically Signed: By: Odessa Fleming M.D. On: 04/23/2020 18:20   MR LUMBAR SPINE  WO CONTRAST  Result Date: 04/23/2020 CLINICAL DATA:  59 year old female with low back pain, urinary incontinence. EXAM: MRI LUMBAR SPINE WITHOUT CONTRAST TECHNIQUE: Multiplanar, multisequence MR imaging of the lumbar spine was performed. No intravenous contrast was administered. COMPARISON:  CT Abdomen and Pelvis 03/25/2020. Thoracic spine MRI today reported separately. FINDINGS: Segmentation: Normal, concordant with the thoracic spine numbering today. Alignment: Stable lumbar lordosis, subtle new retrolisthesis of L2 on L3. Vertebrae: Confluent vertebral body edema at L2 and L3 with intervening abnormal disc space with fluid and endplate erosions. The posterior elements of these levels appear relatively spared although there is abnormal facet joint fluid bilaterally, which is not seen at the adjacent spinal levels. No other marrow edema or evidence of acute osseous abnormality. Intact visible sacrum and SI joints. Conus medullaris and cauda equina: Highly abnormal as described on the thoracic MRI today. Axial images on this exam are also degraded by motion, but the abnormal cord can be seen displaced dorsally beginning at the T11 level (series 3,  image 7). The conus medullaris is difficult to delineate. And there is ventral (lower thoracic) and complete (L1 and lower) filling of the spinal canal by multiloculated appearing T2 and STIR hyperintense material which is likely abscess. Severe spinal stenosis is present from L1 through L4. Possibly more normal appearance of the thecal sac beginning at L5, although the cauda equina nerve roots there are difficult to delineate. Paraspinal and other soft tissues: Positive for lateral paraspinal abscesses beginning at the T12 level and maximal at L1-L2-greater on the right (series 3, image 3). The individual paraspinal abscesses are up to 24 mm. Associated edema in the bilateral upper lumbar psoas muscles, which become more normal by the L4 level. Grossly negative visible abdominal viscera. Posteriorly there is bilateral erector spinae muscle edema, although no posterior paraspinal abscess. Disc levels: Outside of L1-L2, the intervertebral discs appear normal except for chronic degeneration at L3-L4 and L4-L5. There is superimposed degenerative bilateral lumbar facet hypertrophy, maximal at L4-L5 where facet joint fluid is also noted. IMPRESSION: 1. 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 Septic arthritis of the L1-L2 facets is also possible. 2. 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 3. Superimposed lateral paraspinal soft tissue abscesses, greater on the right maximal at L1-L2 and individually up to 2.4 cm. Salient findings discussed by telephone with Dr. Gwyneth Sprout in the ED on 04/23/2020 at 18:28 . Electronically Signed   By: Odessa Fleming M.D.   On: 04/23/2020 18:34    Review of Systems  Constitutional: Positive for appetite change, chills and unexpected weight change.  HENT: Positive for dental problem.   Eyes: Negative.    Respiratory: Negative.   Cardiovascular: Positive for leg swelling.       2+pitting edema  Genitourinary: Positive for difficulty urinating.       Overflow incontinence  Musculoskeletal: Positive for back pain.  Skin: Negative.   Allergic/Immunologic: Negative.   Hematological: Negative.   Psychiatric/Behavioral: Negative.     Blood pressure (!) 154/95, pulse (!) 101, temperature 98.7 F (37.1 C), temperature source Oral, resp. rate 18, height 5\' 4"  (1.626 m), weight 62.6 kg, SpO2 100 %. Physical Exam  Constitutional: She is oriented to person, place, and time.  Neurological: She is alert and oriented to person, place, and time. She displays weakness. A sensory deficit is present. She has  a normal Finger-Nose-Finger Test. She displays no Babinski's sign on the right side. She displays no Babinski's sign on the left side.  Weakness right hf, hams, quads, 3/5, right dorsiflexor 3/5                    Left hf, hams, quads3/5, dorsiflexor 4/5, plantar flexor 3/5 Normal strength upper extremities 5/5 Propriocetion significantly decreased lower extremities Gait not assessed     Assessment/Plan OR for emergent decompression thoracolumbar epidural mass Apparent discitis L1/2  Coletta Memos, MD 04/23/2020, 8:14 PM

## 2020-04-24 ENCOUNTER — Encounter (HOSPITAL_COMMUNITY): Payer: Self-pay | Admitting: Neurosurgery

## 2020-04-24 ENCOUNTER — Inpatient Hospital Stay (HOSPITAL_COMMUNITY): Payer: Medicaid Other

## 2020-04-24 DIAGNOSIS — M869 Osteomyelitis, unspecified: Secondary | ICD-10-CM | POA: Diagnosis present

## 2020-04-24 DIAGNOSIS — F17210 Nicotine dependence, cigarettes, uncomplicated: Secondary | ICD-10-CM | POA: Diagnosis present

## 2020-04-24 DIAGNOSIS — I339 Acute and subacute endocarditis, unspecified: Secondary | ICD-10-CM

## 2020-04-24 DIAGNOSIS — M00011 Staphylococcal arthritis, right shoulder: Secondary | ICD-10-CM

## 2020-04-24 DIAGNOSIS — M009 Pyogenic arthritis, unspecified: Secondary | ICD-10-CM | POA: Diagnosis present

## 2020-04-24 DIAGNOSIS — M86111 Other acute osteomyelitis, right shoulder: Secondary | ICD-10-CM

## 2020-04-24 LAB — COMPREHENSIVE METABOLIC PANEL
ALT: 11 U/L (ref 0–44)
AST: 18 U/L (ref 15–41)
Albumin: 1.9 g/dL — ABNORMAL LOW (ref 3.5–5.0)
Alkaline Phosphatase: 64 U/L (ref 38–126)
Anion gap: 10 (ref 5–15)
BUN: <5 mg/dL — ABNORMAL LOW (ref 6–20)
CO2: 29 mmol/L (ref 22–32)
Calcium: 8.8 mg/dL — ABNORMAL LOW (ref 8.9–10.3)
Chloride: 101 mmol/L (ref 98–111)
Creatinine, Ser: 0.69 mg/dL (ref 0.44–1.00)
GFR calc Af Amer: >60 mL/min (ref >60)
GFR calc non Af Amer: >60 mL/min (ref >60)
Glucose, Bld: 120 mg/dL — ABNORMAL HIGH (ref 70–99)
Potassium: 2.2 mmol/L — CL (ref 3.5–5.1)
Sodium: 140 mmol/L (ref 135–145)
Total Bilirubin: 0.7 mg/dL (ref 0.3–1.2)
Total Protein: 6.4 g/dL — ABNORMAL LOW (ref 6.5–8.1)

## 2020-04-24 LAB — BLOOD CULTURE ID PANEL (REFLEXED)

## 2020-04-24 LAB — HEMOGLOBIN A1C
Hgb A1c MFr Bld: 6.7 % — ABNORMAL HIGH (ref 4.8–5.6)
Mean Plasma Glucose: 145.59 mg/dL

## 2020-04-24 LAB — CBC
HCT: 27.3 % — ABNORMAL LOW (ref 36.0–46.0)
Hemoglobin: 8.7 g/dL — ABNORMAL LOW (ref 12.0–15.0)
MCH: 27 pg (ref 26.0–34.0)
MCHC: 31.9 g/dL (ref 30.0–36.0)
MCV: 84.8 fL (ref 80.0–100.0)
Platelets: 366 10*3/uL (ref 150–400)
RBC: 3.22 MIL/uL — ABNORMAL LOW (ref 3.87–5.11)
RDW: 15.3 % (ref 11.5–15.5)
WBC: 11.3 10*3/uL — ABNORMAL HIGH (ref 4.0–10.5)
nRBC: 0 % (ref 0.0–0.2)

## 2020-04-24 LAB — RAPID URINE DRUG SCREEN, HOSP PERFORMED
Amphetamines: NOT DETECTED
Barbiturates: NOT DETECTED
Benzodiazepines: NOT DETECTED
Cocaine: NOT DETECTED
Opiates: NOT DETECTED
Tetrahydrocannabinol: NOT DETECTED

## 2020-04-24 LAB — MAGNESIUM: Magnesium: 1.8 mg/dL (ref 1.7–2.4)

## 2020-04-24 LAB — GLUCOSE, CAPILLARY: Glucose-Capillary: 109 mg/dL — ABNORMAL HIGH (ref 70–99)

## 2020-04-24 LAB — MRSA PCR SCREENING: MRSA by PCR: NEGATIVE

## 2020-04-24 LAB — SEDIMENTATION RATE: Sed Rate: 110 mm/hr — ABNORMAL HIGH (ref 0–22)

## 2020-04-24 LAB — ECHOCARDIOGRAM COMPLETE
Height: 64 in
Weight: 2208 oz

## 2020-04-24 LAB — PHOSPHORUS: Phosphorus: 3.8 mg/dL (ref 2.5–4.6)

## 2020-04-24 IMAGING — CT CT SHOULDER*R* W/CM
2 series · 15 of 20 positions shown, 18 images · IV contrast (agent unspecified)
Comparison: None.

CONTRAST:  75mL OMNIPAQUE IOHEXOL 300 MG/ML  SOLN

CLINICAL DATA: Fall several weeks ago with right shoulder wound now
with surrounding redness and purulent drainage. Admitted with L1-L2
osteomyelitis-discitis and large epidural abscess.

EXAM:
CT OF THE UPPER RIGHT EXTREMITY WITH CONTRAST
TECHNIQUE: Multidetector CT imaging of the right shoulder was performed
according to the standard protocol following intravenous contrast
administration.

[Series 3: shoulder 3.0 b31s st · axial · 0.49mm/px · z∈[-644,-488]mm · 12 of 62 slices shown, 15 images]
[im 5/62  soft-tissue]
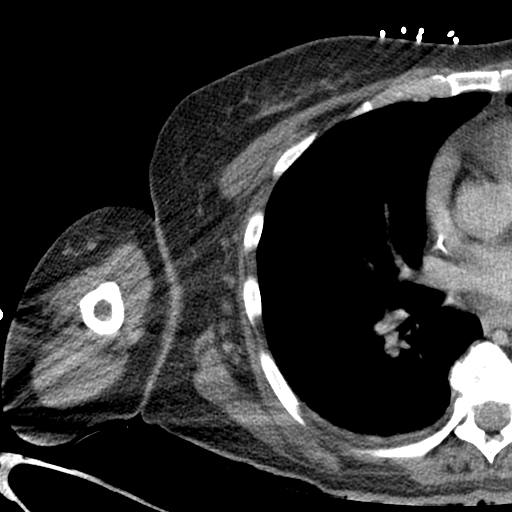
[im 5/62  bone]
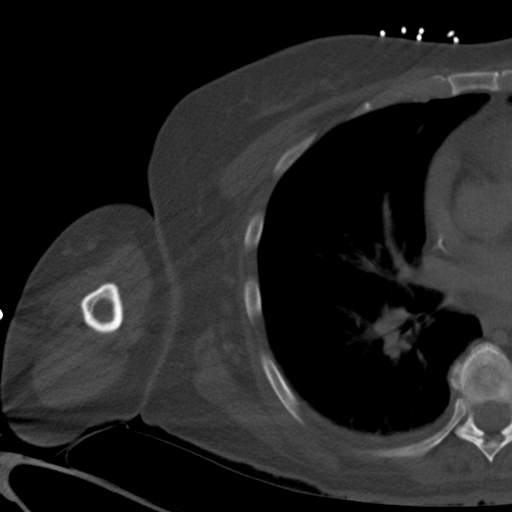
[im 10/62  bone]
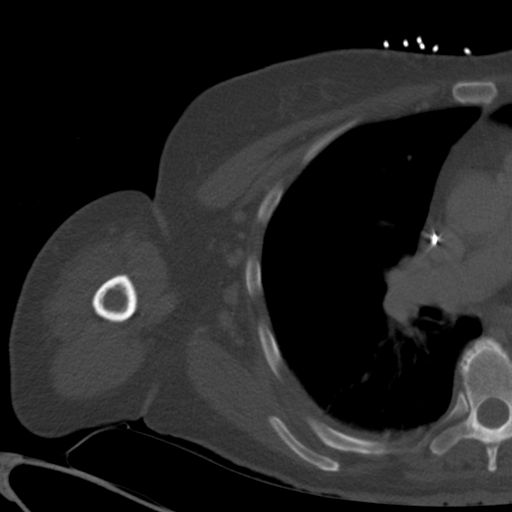
[im 15/62  bone]
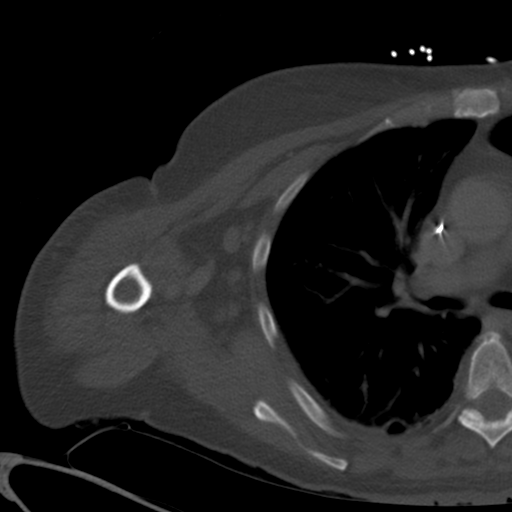
[im 19/62  bone]
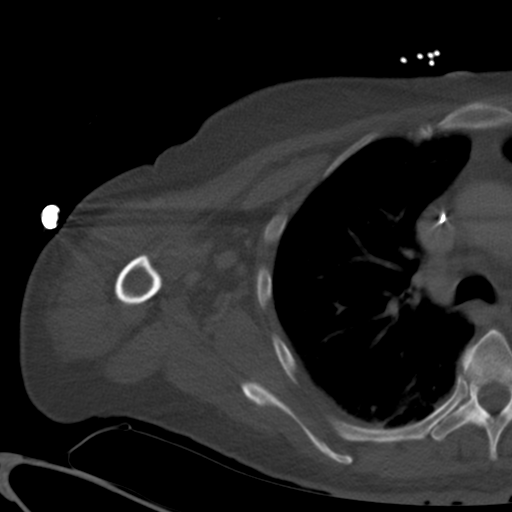
[im 24/62  soft-tissue]
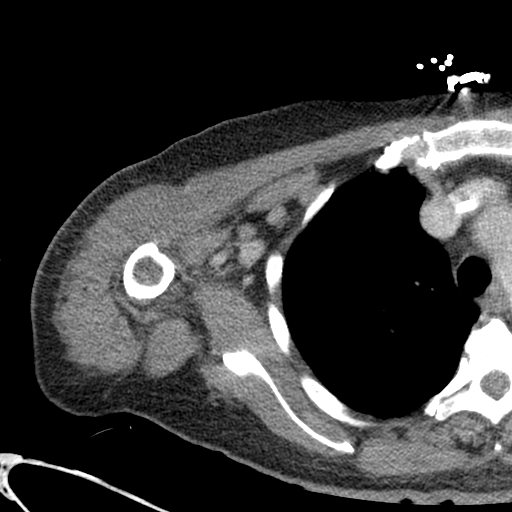
[im 24/62  bone]
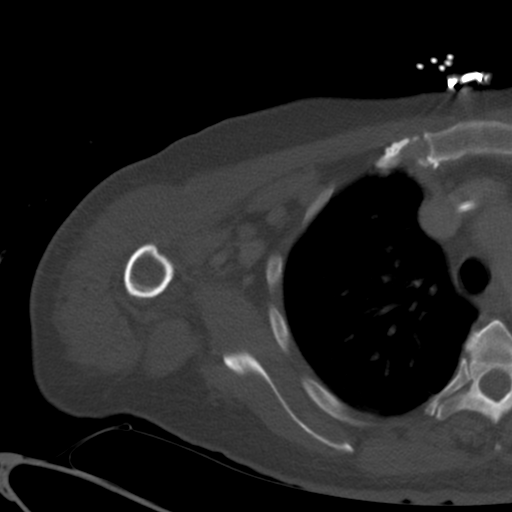
[im 29/62  bone]
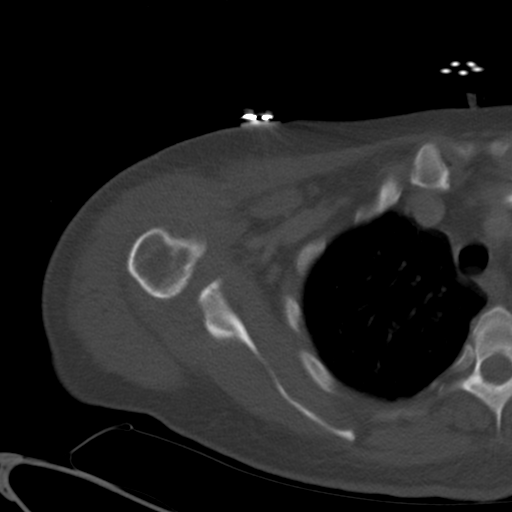
[im 33/62  bone]
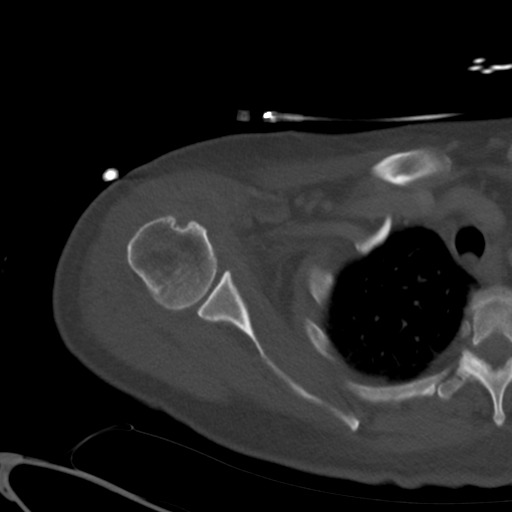
[im 38/62  bone]
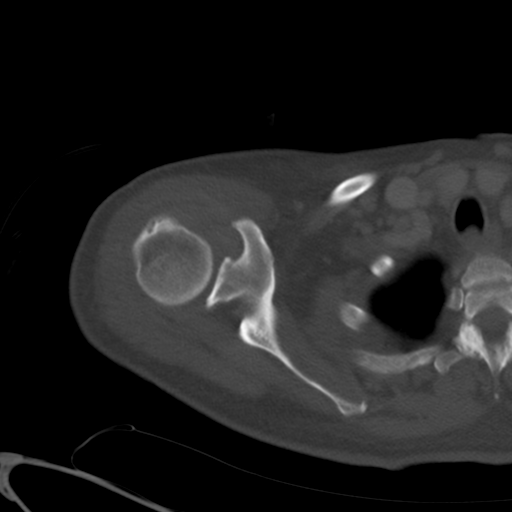
[im 43/62  soft-tissue]
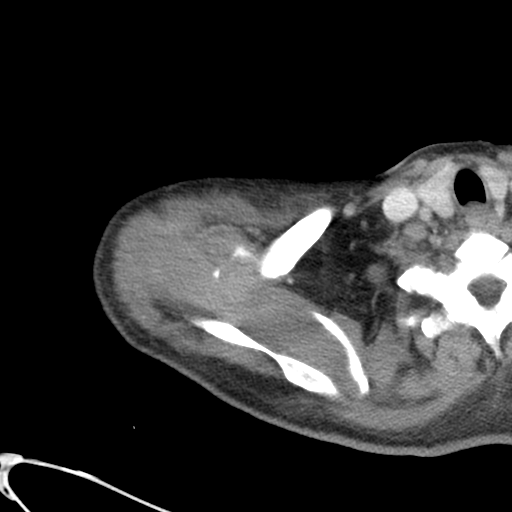
[im 43/62  bone]
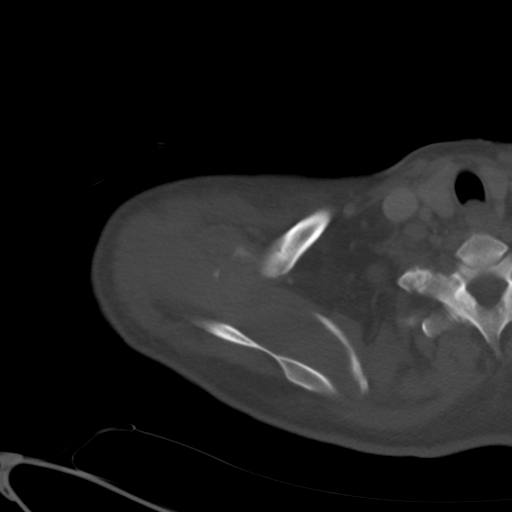
[im 47/62  bone]
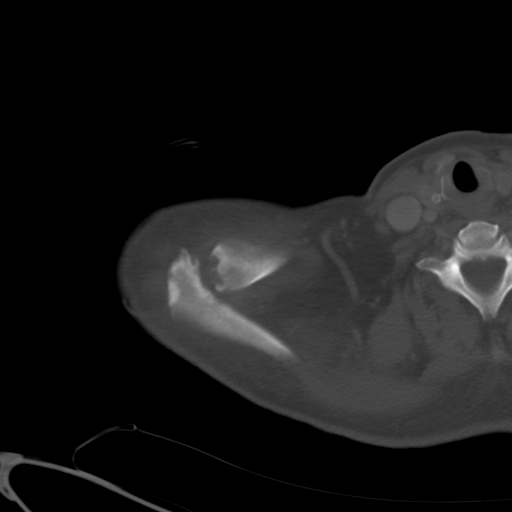
[im 52/62  bone]
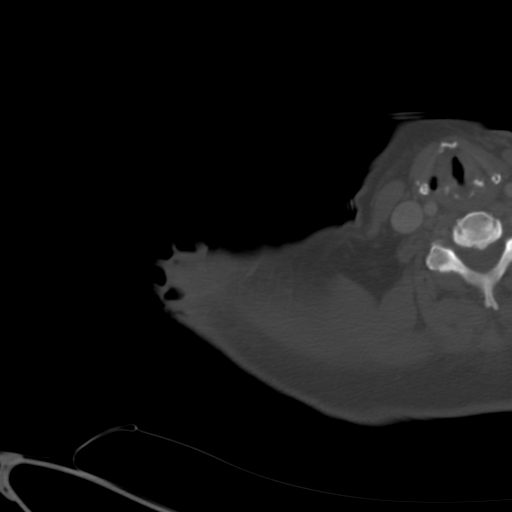
[im 57/62  bone]
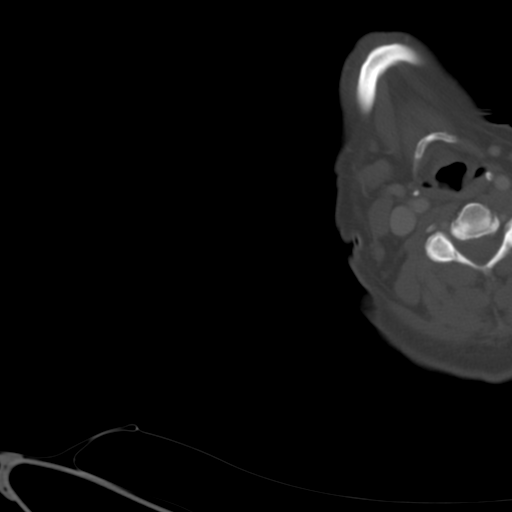

[Series 9: cor st · coronal · 0.41mm/px · 3 of 99 slices shown]
[im 20/99  bone]
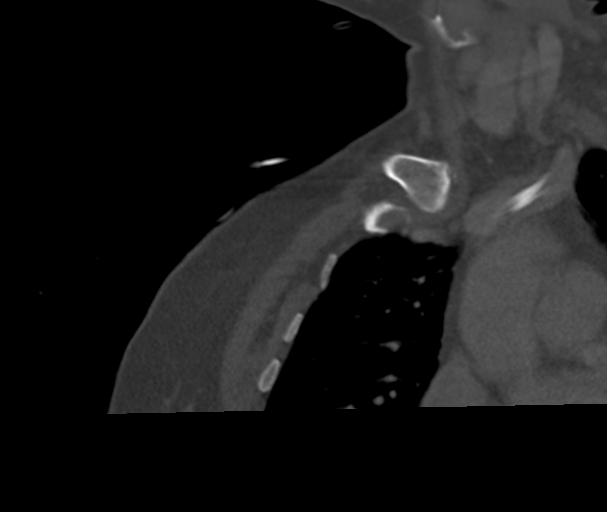
[im 40/99  bone]
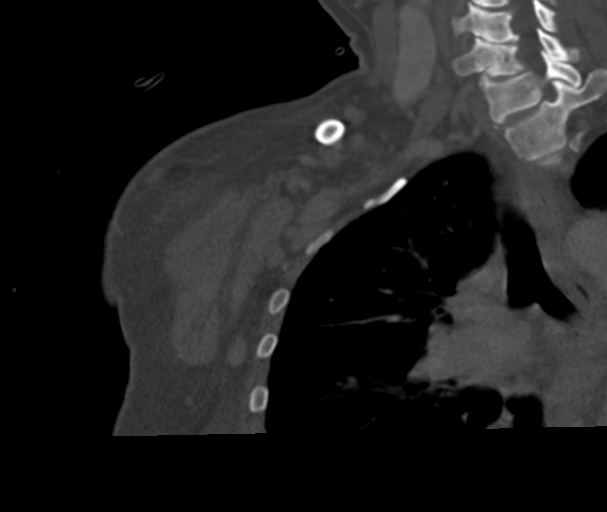
[im 59/99  bone]
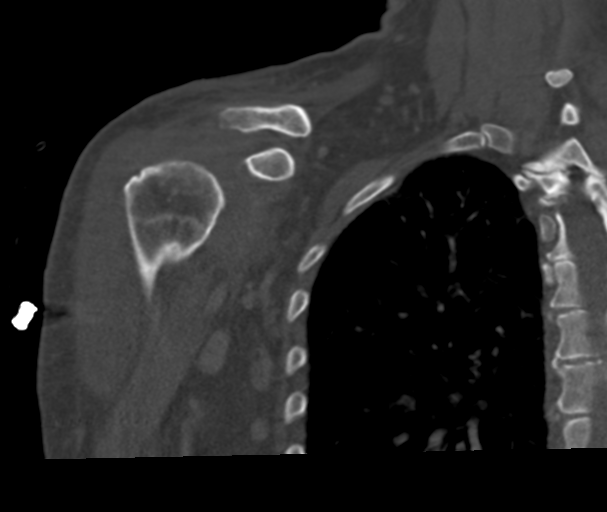

[15 of 20 positions shown; findings below may reference images not displayed]

FINDINGS: Bones/Joint/Cartilage

Erosive changes of the distal clavicle and acromion.
Acromioclavicular joint effusion with distention of the joint
capsule. No fracture or dislocation. No glenohumeral joint effusion.
Small amount of fluid in the subacromial/subdeltoid bursa.

Ligaments

Ligaments are suboptimally evaluated by CT.

Muscles and Tendons
Grossly intact.  No muscle atrophy.

Soft tissue
Soft tissue ulceration on the top of the shoulder with underlying
inflammatory changes extending to the acromioclavicular joint. No
discrete fluid collection. Small reactive right axillary lymph
nodes.
IMPRESSION: 1. Soft tissue ulceration on the top of the shoulder with underlying
acromioclavicular septic arthritis and osteomyelitis of the acromion
and distal clavicle.
2. Small amount of fluid in the subacromial/subdeltoid bursa. Septic
bursitis is not excluded.
3. No glenohumeral erosive changes or joint effusion.

## 2020-04-24 MED ORDER — KETOROLAC TROMETHAMINE 15 MG/ML IJ SOLN
15.0000 mg | Freq: Four times a day (QID) | INTRAMUSCULAR | Status: AC
  Administered 2020-04-24 (×4): 15 mg via INTRAVENOUS
  Filled 2020-04-24 (×4): qty 1

## 2020-04-24 MED ORDER — SODIUM CHLORIDE 0.9% FLUSH
3.0000 mL | Freq: Two times a day (BID) | INTRAVENOUS | Status: DC
  Administered 2020-04-24 – 2020-05-09 (×18): 3 mL via INTRAVENOUS

## 2020-04-24 MED ORDER — ONDANSETRON HCL 4 MG/2ML IJ SOLN: 4.0000 mg | Freq: Four times a day (QID) | INTRAMUSCULAR | Status: DC | PRN

## 2020-04-24 MED ORDER — ZOLPIDEM TARTRATE 5 MG PO TABS
5.0000 mg | ORAL_TABLET | Freq: Every evening | ORAL | Status: DC | PRN
  Administered 2020-04-25 – 2020-05-04 (×4): 5 mg via ORAL
  Filled 2020-04-24 (×4): qty 1

## 2020-04-24 MED ORDER — MAGNESIUM CITRATE PO SOLN: 1.0000 | Freq: Once | ORAL | Status: DC | PRN

## 2020-04-24 MED ORDER — MENTHOL 3 MG MT LOZG
1.0000 | LOZENGE | OROMUCOSAL | Status: DC | PRN
  Filled 2020-04-24: qty 9

## 2020-04-24 MED ORDER — ACETAMINOPHEN 325 MG PO TABS
650.0000 mg | ORAL_TABLET | ORAL | Status: DC | PRN
  Administered 2020-04-24 – 2020-04-27 (×4): 650 mg via ORAL
  Filled 2020-04-24 (×4): qty 2

## 2020-04-24 MED ORDER — DOCUSATE SODIUM 100 MG PO CAPS
100.0000 mg | ORAL_CAPSULE | Freq: Two times a day (BID) | ORAL | Status: DC
  Administered 2020-04-24 (×2): 100 mg via ORAL
  Filled 2020-04-24 (×2): qty 1

## 2020-04-24 MED ORDER — SENNOSIDES-DOCUSATE SODIUM 8.6-50 MG PO TABS
1.0000 | ORAL_TABLET | Freq: Every evening | ORAL | Status: DC | PRN
  Administered 2020-04-27 – 2020-05-02 (×2): 1 via ORAL
  Filled 2020-04-24 (×3): qty 1

## 2020-04-24 MED ORDER — BISACODYL 5 MG PO TBEC
5.0000 mg | DELAYED_RELEASE_TABLET | Freq: Every day | ORAL | Status: DC | PRN
  Administered 2020-04-24: 5 mg via ORAL
  Filled 2020-04-24: qty 1

## 2020-04-24 MED ORDER — SODIUM CHLORIDE 0.9% FLUSH
10.0000 mL | Freq: Two times a day (BID) | INTRAVENOUS | Status: DC
  Administered 2020-04-24 – 2020-04-26 (×5): 10 mL

## 2020-04-24 MED ORDER — ONDANSETRON HCL 4 MG/2ML IJ SOLN
INTRAMUSCULAR | Status: DC | PRN
  Administered 2020-04-24: 4 mg via INTRAVENOUS

## 2020-04-24 MED ORDER — POTASSIUM CHLORIDE CRYS ER 20 MEQ PO TBCR
40.0000 meq | EXTENDED_RELEASE_TABLET | Freq: Three times a day (TID) | ORAL | Status: AC
  Administered 2020-04-24 – 2020-04-25 (×4): 40 meq via ORAL
  Filled 2020-04-24 (×4): qty 2

## 2020-04-24 MED ORDER — ONDANSETRON HCL 4 MG PO TABS: 4.0000 mg | ORAL_TABLET | Freq: Four times a day (QID) | ORAL | Status: DC | PRN

## 2020-04-24 MED ORDER — POTASSIUM CHLORIDE CRYS ER 20 MEQ PO TBCR
40.0000 meq | EXTENDED_RELEASE_TABLET | Freq: Once | ORAL | Status: DC
  Filled 2020-04-24: qty 2

## 2020-04-24 MED ORDER — OXYCODONE HCL 5 MG PO TABS
5.0000 mg | ORAL_TABLET | ORAL | Status: DC | PRN
  Administered 2020-05-06 (×2): 5 mg via ORAL
  Filled 2020-04-24 (×2): qty 1

## 2020-04-24 MED ORDER — SODIUM CHLORIDE 0.9% FLUSH: 10.0000 mL | INTRAVENOUS | Status: DC | PRN

## 2020-04-24 MED ORDER — PANTOPRAZOLE SODIUM 40 MG IV SOLR
40.0000 mg | Freq: Every day | INTRAVENOUS | Status: DC
  Administered 2020-04-24 – 2020-05-01 (×9): 40 mg via INTRAVENOUS
  Filled 2020-04-24 (×8): qty 40

## 2020-04-24 MED ORDER — HYDROMORPHONE HCL 1 MG/ML IJ SOLN: 0.2500 mg | INTRAMUSCULAR | Status: DC | PRN

## 2020-04-24 MED ORDER — POTASSIUM CHLORIDE IN NACL 20-0.9 MEQ/L-% IV SOLN
INTRAVENOUS | Status: DC
  Filled 2020-04-24 (×7): qty 1000

## 2020-04-24 MED ORDER — PHENAZOPYRIDINE HCL 100 MG PO TABS
95.0000 mg | ORAL_TABLET | Freq: Three times a day (TID) | ORAL | Status: DC | PRN
  Administered 2020-04-25 (×2): 100 mg via ORAL
  Filled 2020-04-24 (×4): qty 1

## 2020-04-24 MED ORDER — SODIUM CHLORIDE 0.9 % IV SOLN: 250.0000 mL | INTRAVENOUS | Status: DC

## 2020-04-24 MED ORDER — ACETAMINOPHEN 650 MG RE SUPP: 650.0000 mg | RECTAL | Status: DC | PRN

## 2020-04-24 MED ORDER — PHENOL 1.4 % MT LIQD: 1.0000 | OROMUCOSAL | Status: DC | PRN

## 2020-04-24 MED ORDER — CHLORHEXIDINE GLUCONATE CLOTH 2 % EX PADS
6.0000 | MEDICATED_PAD | Freq: Every day | CUTANEOUS | Status: DC
  Administered 2020-04-24 – 2020-05-09 (×16): 6 via TOPICAL

## 2020-04-24 MED ORDER — IOHEXOL 300 MG/ML  SOLN
75.0000 mL | Freq: Once | INTRAMUSCULAR | Status: AC | PRN
  Administered 2020-04-24: 75 mL via INTRAVENOUS

## 2020-04-24 MED ORDER — ALBUTEROL SULFATE HFA 108 (90 BASE) MCG/ACT IN AERS
INHALATION_SPRAY | RESPIRATORY_TRACT | Status: DC | PRN
Start: 2020-04-24 — End: 2020-04-24
  Administered 2020-04-24: 4 via RESPIRATORY_TRACT

## 2020-04-24 MED ORDER — SODIUM CHLORIDE 0.9% FLUSH
3.0000 mL | INTRAVENOUS | Status: DC | PRN
  Administered 2020-04-27 – 2020-05-03 (×3): 3 mL via INTRAVENOUS

## 2020-04-24 MED ORDER — LABETALOL HCL 5 MG/ML IV SOLN
INTRAVENOUS | Status: DC | PRN
  Administered 2020-04-24 (×2): 5 mg via INTRAVENOUS

## 2020-04-24 MED ORDER — SUGAMMADEX SODIUM 200 MG/2ML IV SOLN
INTRAVENOUS | Status: DC | PRN
  Administered 2020-04-24: 150 mg via INTRAVENOUS
  Administered 2020-04-24: 100 mg via INTRAVENOUS

## 2020-04-24 MED ORDER — DIAZEPAM 5 MG PO TABS
5.0000 mg | ORAL_TABLET | Freq: Four times a day (QID) | ORAL | Status: DC | PRN
  Administered 2020-04-24 – 2020-05-09 (×31): 5 mg via ORAL
  Filled 2020-04-24 (×31): qty 1

## 2020-04-24 MED ORDER — OXYCODONE HCL ER 15 MG PO T12A
15.0000 mg | EXTENDED_RELEASE_TABLET | Freq: Two times a day (BID) | ORAL | Status: DC
  Administered 2020-04-24 – 2020-05-09 (×30): 15 mg via ORAL
  Filled 2020-04-24 (×31): qty 1

## 2020-04-24 MED ORDER — VANCOMYCIN HCL 750 MG/150ML IV SOLN
750.0000 mg | Freq: Two times a day (BID) | INTRAVENOUS | Status: DC
  Administered 2020-04-24 – 2020-04-25 (×2): 750 mg via INTRAVENOUS
  Filled 2020-04-24 (×3): qty 150

## 2020-04-24 MED ORDER — BUPIVACAINE HCL (PF) 0.5 % IJ SOLN
INTRAMUSCULAR | Status: DC | PRN
  Administered 2020-04-23: 30 mL

## 2020-04-24 MED ORDER — PIPERACILLIN-TAZOBACTAM 3.375 G IVPB
3.3750 g | Freq: Three times a day (TID) | INTRAVENOUS | Status: DC
  Administered 2020-04-24: 3.375 g via INTRAVENOUS
  Filled 2020-04-24: qty 50

## 2020-04-24 MED ORDER — MAGNESIUM SULFATE 2 GM/50ML IV SOLN
2.0000 g | Freq: Once | INTRAVENOUS | Status: AC
  Administered 2020-04-24: 2 g via INTRAVENOUS
  Filled 2020-04-24: qty 50

## 2020-04-24 MED ORDER — OXYCODONE HCL 5 MG PO TABS
10.0000 mg | ORAL_TABLET | ORAL | Status: DC | PRN
  Administered 2020-04-24 – 2020-05-08 (×50): 10 mg via ORAL
  Filled 2020-04-24 (×51): qty 2

## 2020-04-24 MED ORDER — AMLODIPINE BESYLATE 5 MG PO TABS
5.0000 mg | ORAL_TABLET | Freq: Every day | ORAL | Status: DC
  Administered 2020-04-24 – 2020-05-09 (×15): 5 mg via ORAL
  Filled 2020-04-24 (×15): qty 1

## 2020-04-24 MED ORDER — MORPHINE SULFATE (PF) 2 MG/ML IV SOLN
2.0000 mg | INTRAVENOUS | Status: DC | PRN
  Administered 2020-04-24 – 2020-04-28 (×9): 2 mg via INTRAVENOUS
  Filled 2020-04-24 (×9): qty 1

## 2020-04-24 NOTE — Progress Notes (Signed)
eLink Physician-Brief Progress Note Patient Name: Debra Schmidt DOB: Sep 20, 1961 MRN: [id number]   Date of Service  04/24/2020  HPI/Events of Note  New patient evaluation. 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 She is s/p emergent lumbar decompression and si admitted to the ICU post-operatively for monitoring and correction of severe electrolyte abnormalities: K 2.0.  Patient is sleeping comfortably on 2L [place]. BP 141/91, HR 76 (SR), SpO2 100%.   eICU Interventions  # Neuro: - Pain control with APAP, Toradol and opioids per neurosurgical team orders.  # ID: Uvaldo Bristle for epidural abscess. - F/u intra-op cultures and BCx. - Gen surg to consult regarding R shoulder wound. F/u CT read. - ID consult in AM.  # Renal: - mIVF @ 100cc/hr. - Repeat labs to re-evaluate hypokalemia and hypomagnesemia.   # Cardiac: - NAI.  # Respiratory: - NAI.  # GI: - NPO for now.      Intervention Category Evaluation Type: New Patient Evaluation  Debra Schmidt 04/24/2020, 1:53 AM

## 2020-04-24 NOTE — Progress Notes (Signed)
Echocardiogram 2D Echocardiogram has been performed.  Debra Schmidt 04/24/2020, 3:10 PM

## 2020-04-24 NOTE — Consult Note (Signed)
Reason for Consult/Chief Complaint: R shoulder drainage, intra-operative consult Consultant: Franky Macho, MD  Debra Schmidt is an 59 y.o. female.   HPI: 2F s/p GLF several weeks ago while in the shower. Reportedly hit her shoulder and subsequently developed low back pain that progressed and was ultimately diagnosed as an epidural abscess. She underwent emergent laminectomy and evacuation of the abscess by Dr. Franky Macho and I was consulted intra-operatively to evaluate her right shoulder while she was under anesthesia.  History reviewed. No pertinent past medical history.  History reviewed. No pertinent surgical history.  No family history on file.  Social History:  reports that she has been smoking cigarettes. She has been smoking about 0.50 packs per day. She has never used smokeless tobacco. She reports previous alcohol use. She reports previous drug use.  Allergies:  Allergies  Allergen Reactions  . Penicillins Hives    Medications: I have reviewed the patient's current medications.  Results for orders placed or performed during the hospital encounter of 04/23/20 (from the past 48 hour(s))  Lipase, blood     Status: None   Collection Time: 04/23/20  1:39 PM  Result Value Ref Range   Lipase 25 11 - 51 U/L    Comment: Performed at [facility], [address], [place], [place] [address]  Comprehensive metabolic panel     Status: Abnormal   Collection Time: 04/23/20  1:39 PM  Result Value Ref Range   Sodium 140 135 - 145 mmol/L   Potassium 2.0 (LL) 3.5 - 5.1 mmol/L    Comment: CRITICAL RESULT CALLED TO, READ BACK BY AND VERIFIED WITH: RN J BLUE AT 1430 04/23/20 BY L BENFIELD    Chloride 97 (L) 98 - 111 mmol/L   CO2 27 22 - 32 mmol/L   Glucose, Bld 143 (H) 70 - 99 mg/dL    Comment: Glucose reference range applies only to samples taken after fasting for at least 8 hours.   BUN 5 (L) 6 - 20 mg/dL   Creatinine, Ser 5.63 0.44 - 1.00 mg/dL   Calcium 9.4 8.9 - 14.9 mg/dL    Total Protein 7.7 6.5 - 8.1 g/dL   Albumin 2.4 (L) 3.5 - 5.0 g/dL   AST 19 15 - 41 U/L   ALT 13 0 - 44 U/L   Alkaline Phosphatase 74 38 - 126 U/L   Total Bilirubin 0.6 0.3 - 1.2 mg/dL   GFR calc non Af Amer >60 >60 mL/min   GFR calc Af Amer >60 >60 mL/min   Anion gap 16 (H) 5 - 15    Comment: Performed at [facility], [address], [place], [place] [address]  CBC     Status: Abnormal   Collection Time: 04/23/20  1:39 PM  Result Value Ref Range   WBC 9.1 4.0 - 10.5 K/uL   RBC 3.80 (L) 3.87 - 5.11 MIL/uL   Hemoglobin 10.4 (L) 12.0 - 15.0 g/dL   HCT 78.5 (L) 36 - 46 %   MCV 84.7 80.0 - 100.0 fL   MCH 27.4 26.0 - 34.0 pg   MCHC 32.3 30.0 - 36.0 g/dL   RDW 88.5 02.7 - 74.1 %   Platelets 407 (H) 150 - 400 K/uL   nRBC 0.0 0.0 - 0.2 %    Comment: Performed at [facility], [address], [place], [place] [address]  Brain natriuretic peptide     Status: Abnormal   Collection Time: 04/23/20  1:39  PM  Result Value Ref Range   B Natriuretic Peptide 275.3 (H) 0.0 - 100.0 pg/mL    Comment: Performed at [facility], [address] [place], [place] [address]  Magnesium     Status: Abnormal   Collection Time: 04/23/20  1:39 PM  Result Value Ref Range   Magnesium 1.6 (L) 1.7 - 2.4 mg/dL    Comment: Performed at [facility], [address], [place], [place] [address]  Urinalysis, Routine w reflex microscopic     Status: None   Collection Time: 04/23/20  3:53 PM  Result Value Ref Range   Color, Urine YELLOW YELLOW   APPearance CLEAR CLEAR   Specific Gravity, Urine 1.009 1.005 - 1.030   pH 6.0 5.0 - 8.0   Glucose, UA NEGATIVE NEGATIVE mg/dL   Hgb urine dipstick NEGATIVE NEGATIVE   Bilirubin Urine NEGATIVE NEGATIVE   Ketones, ur NEGATIVE NEGATIVE mg/dL   Protein, ur NEGATIVE NEGATIVE mg/dL   Nitrite NEGATIVE NEGATIVE   Leukocytes,Ua NEGATIVE NEGATIVE    Comment: Performed at [facility], [address] [place], [place] [address]  SARS Coronavirus 2 by  RT PCR (hospital order, performed in [facility] hospital lab) Nasopharyngeal Nasopharyngeal Swab     Status: None   Collection Time: 04/23/20  6:51 PM   Specimen: Nasopharyngeal Swab  Result Value Ref Range   SARS Coronavirus 2 NEGATIVE NEGATIVE    Comment: (NOTE) SARS-CoV-2 target nucleic acids are NOT DETECTED.  The SARS-CoV-2 RNA is generally detectable in upper and lower respiratory specimens during the acute phase of infection. The lowest concentration of SARS-CoV-2 viral copies this assay can detect is 250 copies / mL. A negative result does not preclude SARS-CoV-2 infection and should not be used as the sole basis for treatment or other patient management decisions.  A negative result may occur with improper specimen collection / handling, submission of specimen other than nasopharyngeal swab, presence of viral mutation(s) within the areas targeted by this assay, and inadequate number of viral copies (<250 copies / mL). A negative result must be combined with clinical observations, patient history, and epidemiological information.  Fact Sheet for Patients:   BoilerBrush.com.cy  Fact Sheet for Healthcare Providers: https://pope.com/  This test is not yet approved or  cleared by the [place] FDA and has been authorized for detection and/or diagnosis of SARS-CoV-2 by FDA under an Emergency Use Authorization (EUA).  This EUA will remain in effect (meaning this test can be used) for the duration of the COVID-19 declaration under Section 564(b)(1) of the Act, 21 U.S.C. section 360bbb-3(b)(1), unless the authorization is terminated or revoked sooner.  Performed at [facility], [address], [place], [place] [address]   Basic metabolic panel     Status: Abnormal   Collection Time: 04/23/20  8:54 PM  Result Value Ref Range   Sodium 140 135 - 145 mmol/L   Potassium 2.0 (LL) 3.5 - 5.1 mmol/L    Comment: CRITICAL  RESULT CALLED TO, READ BACK BY AND VERIFIED WITH: Brett Canales RN [id number] M GARRETT    Chloride 99 98 - 111 mmol/L   CO2 28 22 - 32 mmol/L   Glucose, Bld 161 (H) 70 - 99 mg/dL    Comment: Glucose reference range applies only to samples taken after fasting for at least 8 hours.   BUN <5 (L) 6 - 20 mg/dL   Creatinine, Ser 0.94 0.44 - 1.00 mg/dL   Calcium 9.4  8.9 - 10.3 mg/dL   GFR calc non Af Amer >60 >60 mL/min   GFR calc Af Amer >60 >60 mL/min   Anion gap 13 5 - 15    Comment: Performed at [facility], [address] [place], [place] [id number]  I-stat chem 8, ED (not at [facility] or [facility])     Status: Abnormal   Collection Time: 04/23/20  8:56 PM  Result Value Ref Range   Sodium 143 135 - 145 mmol/L   Potassium 2.0 (LL) 3.5 - 5.1 mmol/L   Chloride 99 98 - 111 mmol/L   BUN 3 (L) 6 - 20 mg/dL   Creatinine, Ser 6.040.60 0.44 - 1.00 mg/dL   Glucose, Bld 540157 (H) 70 - 99 mg/dL    Comment: Glucose reference range applies only to samples taken after fasting for at least 8 hours.   Calcium, Ion 1.18 1.15 - 1.40 mmol/L   TCO2 29 22 - 32 mmol/L   Hemoglobin 10.2 (L) 12.0 - 15.0 g/dL   HCT 98.130.0 (L) 36 - 46 %    DG Thoracolumabar Spine  Result Date: 04/23/2020 CLINICAL DATA:  Elective surgery EXAM: THORACOLUMBAR SPINE 1V COMPARISON:  None. FINDINGS: Cross-table lateral views of the lumbar spine are submitted. Final image demonstrates posterior surgical instruments directed at the T12-L1 level. IMPRESSION: Intraoperative localization as above. Electronically Signed   By: Charlett NoseKevin  Dover M.D.   On: 04/23/2020 23:30   MR THORACIC SPINE WO CONTRAST  Addendum Date: 04/23/2020   ADDENDUM REPORT: 04/23/2020 18:36 ADDENDUM: Critical findings discussed by telephone with Dr. Gwyneth SproutWHITNEY PLUNKETT on 04/23/2020 at 18:26. Electronically Signed   By: Odessa FlemingH  Hall M.D.   On: 04/23/2020 18:36   Result Date: 04/23/2020 CLINICAL DATA:  [age] year old female with low back pain, urinary incontinence. EXAM: MRI THORACIC SPINE  WITHOUT CONTRAST TECHNIQUE: Multiplanar, multisequence MR imaging of the thoracic spine was performed. No intravenous contrast was administered. COMPARISON:  Lumbar MRI today reported separately. CT Abdomen and Pelvis 02/24/2020. FINDINGS: Limited cervical spine imaging:  Negative. Thoracic spine segmentation: Appears to be normal, and concordant with the lumbar numbering today. Alignment: Mildly exaggerated thoracic kyphosis. No spondylolisthesis. Vertebrae: No acute osseous abnormality identified in the thoracic spine, thoracic marrow signal appears to remain normal. Abnormal L1 and L2 levels, detailed separately today. Cord:  Axial images degraded by motion. Abnormal thoracic spinal cord T2 and STIR hyperintensity within the central cord beginning at the T8 vertebral level (series 5, image 12, series 8, image 31) and continuing to the cauda equina. Mildly abnormal central cord signal may extend as high as the T4 level, but above that the cord appears within normal limits. Superimposed bulky abnormal intraspinal T2 and STIR hyperintense signal in the lower thoracic spinal canal beginning at T10-T11 (series 6, image 10) and continuing into the lumbar spinal canal. Axial images through the segments are degraded by motion but the cord appears primarily displaced and compressed posteriorly by the bulky fluid signal space-occupying mass (series 12, image 29). The dorsal thoracic epidural space is probably abnormal as high as the T9 level. Paraspinal and other soft tissues: The lower thoracic paraspinal soft tissues become abnormal at the T11-T12 level, with STIR hyperintensity compatible with inflammation. Abnormality continues into the lumbar spine reported separately. Grossly negative visible thoracic and upper abdominal viscera. Disc levels: Mild thoracic spine degeneration with the exception of T8-T9 where disc desiccation and disc space loss are accompanied by disc bulging. No spinal or foraminal stenosis  associated. IMPRESSION: 1. Positive  for compression of the lower thoracic spinal cord and conus by what appears to be a bulky multiloculated intraspinal fluid collection emanating from the abnormal lumbar L1-L2 level (detailed separately). 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 2. Severe abnormal signal in the spinal cord - especially from T8 inferiorly - could be cord edema from compression, but consider also Venous Cord Infarction from septic spinal venous thrombophlebitis. 3. Recommend Neurosurgery, Neurology, and Infectious Disease consultation. Electronically Signed: By: Genevie Ann M.D. On: 04/23/2020 18:20   MR LUMBAR SPINE WO CONTRAST  Result Date: 04/23/2020 CLINICAL DATA:  59 year old female with low back pain, urinary incontinence. EXAM: MRI LUMBAR SPINE WITHOUT CONTRAST TECHNIQUE: Multiplanar, multisequence MR imaging of the lumbar spine was performed. No intravenous contrast was administered. COMPARISON:  CT Abdomen and Pelvis 03/25/2020. Thoracic spine MRI today reported separately. FINDINGS: Segmentation: Normal, concordant with the thoracic spine numbering today. Alignment: Stable lumbar lordosis, subtle new retrolisthesis of L2 on L3. Vertebrae: Confluent vertebral body edema at L2 and L3 with intervening abnormal disc space with fluid and endplate erosions. The posterior elements of these levels appear relatively spared although there is abnormal facet joint fluid bilaterally, which is not seen at the adjacent spinal levels. No other marrow edema or evidence of acute osseous abnormality. Intact visible sacrum and SI joints. Conus medullaris and cauda equina: Highly abnormal as described on the thoracic MRI today. Axial images on this exam are also degraded by motion, but the abnormal cord can be seen displaced dorsally beginning at the T11 level (series 3, image 7). The conus medullaris is difficult to delineate. And there is ventral (lower thoracic) and  complete (L1 and lower) filling of the spinal canal by multiloculated appearing T2 and STIR hyperintense material which is likely abscess. Severe spinal stenosis is present from L1 through L4. Possibly more normal appearance of the thecal sac beginning at L5, although the cauda equina nerve roots there are difficult to delineate. Paraspinal and other soft tissues: Positive for lateral paraspinal abscesses beginning at the T12 level and maximal at L1-L2-greater on the right (series 3, image 3). The individual paraspinal abscesses are up to 24 mm. Associated edema in the bilateral upper lumbar psoas muscles, which become more normal by the L4 level. Grossly negative visible abdominal viscera. Posteriorly there is bilateral erector spinae muscle edema, although no posterior paraspinal abscess. Disc levels: Outside of L1-L2, the intervertebral discs appear normal except for chronic degeneration at L3-L4 and L4-L5. There is superimposed degenerative bilateral lumbar facet hypertrophy, maximal at L4-L5 where facet joint fluid is also noted. IMPRESSION: 1. 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 Septic arthritis of the L1-L2 facets is also possible. 2. 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 3. Superimposed lateral paraspinal soft tissue abscesses, greater on the right maximal at L1-L2 and individually up to 2.4 cm. Salient findings discussed by telephone with Dr. Blanchie Dessert in the ED on 04/23/2020 at 18:28 . Electronically Signed   By: Genevie Ann M.D.   On: 04/23/2020 18:34    ROS 10 point review of systems is negative except as listed above in HPI.   Physical Exam Blood pressure (!) 154/95, pulse (!) 101, temperature 98.7 F (37.1 C), temperature source Oral, resp. rate 18, height 5\' 4"  (1.626 m), weight 62.6 kg, SpO2 100  %. Constitutional: well-developed, well-nourished Chest: normal work  of breathing Back: thoracolumbar surgical wound, no other wounds present Extremities: R shoulder with purulent, foul-smelling drainage overlying AC joint. No expressible fluid and no edema/erythema appreciated Skin: warm, dry, no rashes    Assessment/Plan: 53F with R shoulder wound after fall in the shower a few weeks ago.   Shoulder wound - local wound care for now, R shoulder CT to evaluate for bony injury and extension of infection into the joint or bone. Recommend WOC consult for local debridement assuming no deeper extension and ortho c/s if deeper extension is present. Will follow up on imaging.    Diamantina Monks, MD General and Trauma Surgery [facility]

## 2020-04-24 NOTE — Progress Notes (Signed)
Orthopedic Tech Progress Note Patient Details:  Debra Schmidt [birth year] [id number] Called in order to [facility] for an ASPEN LUMBAR BRACE Patient ID: Debra Schmidt, female   DOB: 11-02-1961, 59 y.o.   MRN: [id number]   [name] 04/24/2020, 8:33 AM

## 2020-04-24 NOTE — Transfer of Care (Signed)
Immediate Anesthesia Transfer of Care Note  Patient: Debra Schmidt  Procedure(s) Performed: Lumbar One-Two LAMINECTOMY for Epidural Abscess Evacuation (N/A Back)  Patient Location: PACU  Anesthesia Type:General  Level of Consciousness: drowsy, patient cooperative and responds to stimulation  Airway & Oxygen Therapy: Patient Spontanous Breathing and Patient connected to nasal cannula oxygen  Post-op Assessment: Report given to RN and Post -op Vital signs reviewed and stable  Post vital signs: Reviewed and stable  Last Vitals:  Vitals Value Taken Time  BP 141/86 04/24/20 0033  Temp    Pulse 74 04/24/20 0037  Resp 16 04/24/20 0037  SpO2 100 % 04/24/20 0037  Vitals shown include unvalidated device data.  Last Pain:  Vitals:   04/24/20 0030  TempSrc:   PainSc: (P) 0-No pain         Complications: No complications documented.

## 2020-04-24 NOTE — Progress Notes (Signed)
CRITICAL VALUE ALERT  Critical Value:  Potassium 2.2  Date & Time Notied:  04/24/20 9432  Provider Notified: K. Cabbell [phone] on unit  Orders Received/Actions taken: orders placed

## 2020-04-24 NOTE — Progress Notes (Addendum)
NAME:  Debra Schmidt, MRN:  [id number], DOB:  08/16/1961, LOS: 1 ADMISSION DATE:  04/23/2020, CONSULTATION DATE:  04/23/20 REFERRING MD:  Franky Macho, CHIEF COMPLAINT:  Back pain   Brief History   59 year old female with PMHx of remote drug use (xanax, hydrocodone) and recent fall who presented with 3 weeks of lower back pain which progressed to urinary retention and lower extremity weakness and swelling. Found to have severe intraspinal abscess with compression of lower thoracic spinal cord with osteomyelitis and discitis. S/p emergent thoracolumbar decompression. Monitoring by PCCM post-op and for severe elec abnormality.  Past Medical History  History reviewed. No persistent past medical history.  Significant Hospital Events   6/13 - emergent OR decompression via laminectomy, PCCM consulted 6/14 - stable, appropriate for transfer to stepdown  Consults:  ID  Procedures:  06/13 -  ETT, extubated post-procedure 06/13 - L1/L2 laminectomy  Significant Diagnostic Tests:  04/23/20 MRI Lumbar spine>>Complicated L1-L2 Discitis Osteomyelitis with Severe Intraspinal Abscess 04/23/20 MRI Thoracic Spine>>compression of the lower thoracic spinal cord and conus by what appears to be a bulky multilocul ated intraspinal fluid collection  6/14 CT Shoulder W contrast >> Soft tissue ulceration with underlying acromioclavicular septic arthritis and osteomyelitis of the acromion and distal clavicle. Septic bursitis not excluded.   Micro Data:  04/23/20  BCx2>> 04/23/20 Sars-CoV-2>>negative  6/14 - thoracolumbar abscess with few GPCs, few WBCs. Culture pending. Blood culture pending.  Antimicrobials:  Vancomycin 6/13- Zosyn 6/13-   Interim history/subjective:  Pt resting in bed. Reports that she is feeling well and denies new pain or discomfort. Reporting some lower back pain and soreness. States that her leg weakness is improved. Was not able to move her legs freely previously and says that she can  now move them.   Objective   Blood pressure (!) 157/101, pulse 83, temperature 99.3 F (37.4 C), temperature source Axillary, resp. rate 18, height 5\' 4"  (1.626 m), weight 62.6 kg, SpO2 100 %.        Intake/Output Summary (Last 24 hours) at 04/24/2020 04/26/2020 Last data filed at 04/24/2020 0800 Gross per 24 hour  Intake 2324.73 ml  Output 3625 ml  Net -1300.27 ml   Filed Weights   04/23/20 1332  Weight: 62.6 kg    Examination: General:  adult female, lying on R side in bed, sleeping HEENT: MM pink/moist, very poor dentition Neuro: awake and alert, following commands, CN I-XII intact, LE sensation intact, good tone, full ROM of UE and LE, 4/5 strength of LE, full reflexes  CV: s1s2 rrr, no m/r/g PULM:  CTAB GI: soft, bsx4 active  Extremities: warm/dry, no edema  Skin: superior R shoulder lesion and draining abscess, no reduced ROM in the joint   Resolved Hospital Problem list     Assessment & Plan:  Extensive Intraspinal abscess and cord compression S/p decompression by NSG on 6/7. Post-op monitoring in ICU. Unclear source - possibly hematogenous seeding from shoulder cellulitis, remote drug use. UDS negative. Afebrile and nonseptic appearing at this time. -Broad empiric antibiotic coverage with vancomycin and Zosyn - F/u blood cutures. Obtain serial blood cultures for the next 2 days - ID c/s - F/u lumbar abscess culture  Severe Hypokalemia, hypomagnesemia  Not on diuretics, renal function is normal, unclear source though possibly related to reportedly poor dietary intake. K 2.2 and Mg 1.8 on 6/14. - Continue to replete - Currently receiving KCl 7/14 x 4 - Will replete Mg with goal of 2 - Will check phos - NaCL  with KCl at 80 mL/hr - Repeat level today   Elevated BP Pt has not been diagnosed with hypertension in the past and takes no medication, may be pain related. Asymptomatic and no intracranial process. - CTM - If persists, will order oral anti-hypertensives  R  shoulder cellulitis/abscess Pt reports skin tear after falling in the shower several weeks ago, spontaneously draining without decreased ROM in the R shoulder. On CT, found to have osteomyelitis of acromion and clavicle. Likely source of hematogenous seeding -Covered with Vanc/Zosyn  DM A1C was 6.7. Not on home diabetes meds.  - SSI  Best practice:  Diet: Regular diet Pain/Anxiety/Delirium protocol (if indicated): Dilaudid VAP protocol (if indicated): HOB 30 degrees, suction prn DVT prophylaxis: SCD's GI prophylaxis: protonix Glucose control: SSI Mobility: bed rest Code Status: full code Family Communication: per primary Disposition: ICU. Will transfer to stepdown   Critical care time:

## 2020-04-24 NOTE — Progress Notes (Signed)
Patient ID: Debra Schmidt, female   DOB: 1961/02/23, 59 y.o.   MRN: [id number] BP 109/63   Pulse 85   Temp 98 F (36.7 C) (Oral)   Resp 20   Ht 5\' 4"  (1.626 m)   Wt 62.6 kg   SpO2 99%   BMI 23.69 kg/m  Alert and oriented x 4,speech is clear and fluent Moving lower extremities better Wound is clean, dressing stained Ct shoulder today, will most likely need ortho consult, await results Waiting on ID consult placed yesterday

## 2020-04-24 NOTE — Anesthesia Postprocedure Evaluation (Signed)
Anesthesia Post Note  Patient: Debra Schmidt  Procedure(s) Performed: Lumbar One-Two LAMINECTOMY for Epidural Abscess Evacuation (N/A Back)     Patient location during evaluation: PACU Anesthesia Type: General Level of consciousness: awake and alert Pain management: pain level controlled Vital Signs Assessment: post-procedure vital signs reviewed and stable Respiratory status: spontaneous breathing, nonlabored ventilation, respiratory function stable and patient connected to nasal cannula oxygen Cardiovascular status: blood pressure returned to baseline and stable Postop Assessment: no apparent nausea or vomiting Anesthetic complications: no   No complications documented.  Last Vitals:  Vitals:   04/24/20 0030 04/24/20 0150  BP: (!) 141/86 (!) 141/91  Pulse:  76  Resp:  18  Temp: (!) 36.4 C 36.7 C  SpO2:  100%    Last Pain:  Vitals:   04/24/20 0150  TempSrc: Oral  PainSc:                  Jos Cygan,W. EDMOND

## 2020-04-24 NOTE — Op Note (Signed)
04/23/2020 - 04/24/2020  12:29 AM  PATIENT:  Debra Schmidt  59 y.o. female whom fell approximately 2 weeks ago leading to an open wound over the right shoulder which is currently purulent and with surrounding erythema. She presents today with overflow incontinence, lower extremity weakness, and a large epidural mass on MRI centered at L1/2. It also appears that she has a discitis at L1/2  PRE-OPERATIVE DIAGNOSIS:  Epidural Mass  POST-OPERATIVE DIAGNOSIS:  Epidural Abscess, L1/2 discitis  PROCEDURE:  Procedure(s): Lumbar One-Two LAMINECTOMY for Epidural Abscess Evacuation  SURGEON: Surgeon(s): Coletta Memos, MD  ASSISTANTS:none  ANESTHESIA:   general  EBL:  Total I/O In: 1150 [I.V.:500; IV Piggyback:650] Out: 2000 [Urine:1850; Blood:150]  BLOOD ADMINISTERED:none  CELL SAVER GIVEN:none  COUNT:per nursing  DRAINS: none   SPECIMEN:  Source of Specimen:  ;1/2 epidural space, and L1/2 disc space  DICTATION: Debra Schmidt was taken to the operating room, intubated, and placed under a general anesthetic without difficulty. She was positioned prone on a Wilson frame with all pressure points padded. Her thoracolumbar region was prepped and draped in a sterile manner. I opened the skin with a 10 blade and dissected sharply to the thoracolumbar fascia. I exposed the lamina of T12, L1, and L2 bilaterally. I performed a laminectomy of L1 and a hemilaminectomy of L2. The dura was quite tense. I dissected laterally first on the right then the left. At the disc space I was able to use some blunt tip right angle probes to dissect anterior to the thecal sac. It was then that I was able to express a great deal of purulent material which was sent to the lab for testing. I dissected rostrally and caudally. I did not believe that a multilevel laminectomy was indicated as the thoracic cord was already showing high signal on T2 sequences. Thus once I had drained the abscess overlying the L1/2 disc space, I  irrigated and then closed. I approximated the thoracolumbAr fascia, the subcutaneous and subcuticular planes with vicryl sutures. I used dermabond and an occlusive bandage for a dressing. She was rolled supine on the Icu bed and extubated.   PLAN OF CARE: Admit to inpatient   PATIENT DISPOSITION:  PACU - hemodynamically stable.   Delay start of Pharmacological VTE agent (>24hrs) due to surgical blood loss or risk of bleeding:  yes

## 2020-04-24 NOTE — Progress Notes (Addendum)
Called ortho tech, aspen lumbar brace ordered.    Brace delivered 0930.

## 2020-04-24 NOTE — Progress Notes (Signed)
Rehab Admissions Coordinator Note:  Patient was screened by Clois Dupes for appropriateness for an Inpatient Acute Rehab Consult per PT recs.   At this time, we are recommending Inpatient Rehab consult. Please place order for consult if you would like patient considered for admit. Please advise.  Clois Dupes RN MSN 04/24/2020, 6:24 PM  I can be reached at [phone].

## 2020-04-24 NOTE — Evaluation (Signed)
Physical Therapy Evaluation Patient Details Name: Debra Schmidt MRN: [id number] DOB: 18-Aug-1961 Today's Date: 04/24/2020   History of Present Illness  59 y.o. female admitted on 04/23/20 for emergent decompression of throacolumbar epidural abcess, discitis L1/2 s/p laminectomy L1/2 and evacuation of abcess.  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  Infectious disease is following suspicious that both R shoulder and back are staph infections.  Blood cultures are pending.  Pt with no significant medical history listed in chart.  Clinical Impression  Pt is weak over her feet, unable to stand fully upright even with support of the RW and two people.  She was able to successfully transfer OOB to the recliner chair.  She is restless, moving around constantly in the bed, EOB, and in the chair.  She would benefit from intensive post acute rehab before returning home.   PT to follow acutely for deficits listed below.     Follow Up Recommendations CIR    Equipment Recommendations  3in1 (PT);Wheelchair (measurements PT);Wheelchair cushion (measurements PT)    Recommendations for Other Services Rehab consult     Precautions / Restrictions Precautions Precautions: Fall;Back Precaution Comments: verbally review BLT precautions, brace use Required Braces or Orthoses: Spinal Brace Spinal Brace: Lumbar corset;Applied in sitting position      Mobility  Bed Mobility Overal bed mobility: Needs Assistance Bed Mobility: Rolling;Sidelying to Sit Rolling: Min assist Sidelying to sit: Mod assist       General bed mobility comments: Verbally reviewed log roll, assisted pt in flexion bil knees and rolling to her side, mod assist to come up to sitting on the side of the bed.   Transfers Overall transfer level: Needs assistance Equipment used: Rolling walker (2 wheeled);2 person hand held assist Transfers: Sit to/from  [employer] Sit to Stand: +2 physical assistance;Mod assist Stand pivot transfers: +2 physical assistance;Mod assist       General transfer comment: Stood twice EOB, pt unable to maintain for very long before sitting again, did not do well with stepping and side stepping in standing, so moved RW out of the way for pivotal steps to the chair with two person hand held assist.   Ambulation/Gait             General Gait Details: Not safe to attempt at this time.       Balance Overall balance assessment: Needs assistance Sitting-balance support: Feet supported;Bilateral upper extremity supported Sitting balance-Leahy Scale: Poor Sitting balance - Comments: needs min assist EOB, tipping in all directions (posterior, R) restless and writhing around    Standing balance support: Bilateral upper extremity supported Standing balance-Leahy Scale: Poor Standing balance comment: needs two person external assist in standing with and without RW.                              Pertinent Vitals/Pain Pain Assessment: Faces Faces Pain Scale: Hurts even more Pain Location: back Pain Descriptors / Indicators: Grimacing;Guarding;Restless Pain Intervention(s): Limited activity within patient's tolerance;Monitored during session;Repositioned    Home Living Family/patient expects to be discharged to:: Private residence Living Arrangements: Spouse/significant other (husband works) Available Help at Discharge: Family Type of Home: House Home Access: Stairs to enter Entrance Stairs-Rails: Right;Left;Can reach both Secretary/administrator of Steps: 2 Home Layout: One level Home Equipment: Walker - 4 wheels      Prior Function Level of Independence: Needs  assistance   Gait / Transfers Assistance Needed: per pt, she was ambulating with rollator for the past two weeks, fell and scraped her R shoulder.            Hand Dominance   Dominant Hand: Left     Extremity/Trunk Assessment   Upper Extremity Assessment Upper Extremity Assessment: Defer to OT evaluation    Lower Extremity Assessment Lower Extremity Assessment: RLE deficits/detail;LLE deficits/detail RLE Deficits / Details: bil LE weakness L>R when assessed at bed level.  unable to lift either leg against gravity (R weakly, but not through full ROM, L unable to clear back of thigh off of bed).   RLE Sensation:  (per pt report normal and equal bil) LLE Deficits / Details: bil LE weakness L>R when assessed at bed level.  unable to lift either leg against gravity (R weakly, but not through full ROM, L unable to clear back of thigh off of bed).   LLE Sensation:  (per pt report WNL and equal bil)    Cervical / Trunk Assessment Cervical / Trunk Assessment: Other exceptions Cervical / Trunk Exceptions: post op back  Communication      Cognition Arousal/Alertness: Awake/alert Behavior During Therapy: Restless Overall Cognitive Status: No family/caregiver present to determine baseline cognitive functioning                                 General Comments: Pt restless in the bed, but answering all of my PLOF/Hx questions for me, a bit lethargic, but that could be meds?             Assessment/Plan    PT Assessment Patient needs continued PT services  PT Problem List Decreased strength;Decreased activity tolerance;Decreased balance;Decreased mobility;Decreased knowledge of use of DME;Pain       PT Treatment Interventions DME instruction;Gait training;Functional mobility training;Stair training;Therapeutic activities;Therapeutic exercise;Balance training;Modalities;Neuromuscular re-education;Patient/family education;Cognitive remediation    PT Goals (Current goals can be found in the Care Plan section)  Acute Rehab PT Goals Patient Stated Goal: to decrease back pain PT Goal Formulation: With patient Time For Goal Achievement: 05/08/20 Potential to Achieve Goals:  Good    Frequency Min 5X/week           AM-PAC PT "6 Clicks" Mobility  Outcome Measure Help needed turning from your back to your side while in a flat bed without using bedrails?: A Little Help needed moving from lying on your back to sitting on the side of a flat bed without using bedrails?: A Lot Help needed moving to and from a bed to a chair (including a wheelchair)?: A Lot Help needed standing up from a chair using your arms (e.g., wheelchair or bedside chair)?: A Lot Help needed to walk in hospital room?: Total Help needed climbing 3-5 steps with a railing? : Total 6 Click Score: 11    End of Session Equipment Utilized During Treatment: Gait belt;Back brace Activity Tolerance: Patient limited by pain;Patient limited by lethargy;Other (comment) (restlessness) Patient left: in chair;with call bell/phone within reach;with chair alarm set Nurse Communication: Mobility status PT Visit Diagnosis: Muscle weakness (generalized) (M62.81);Difficulty in walking, not elsewhere classified (R26.2);Pain;Other symptoms and signs involving the nervous system (R29.898) Pain - Right/Left:  (lower) Pain - part of body:  (back)    Time: 1062-6948 PT Time Calculation (min) (ACUTE ONLY): 22 min   Charges:         Corinna Capra, PT, DPT  Acute Rehabilitation #[phone]  [phone] pager #[phone] office     PT Evaluation $PT Eval Moderate Complexity: 1 Mod         04/24/2020, 3:03 PM

## 2020-04-24 NOTE — Consult Note (Signed)
Reason for Consult:Right AC septic arthritis Referring Physician: Benetta Spar [name] is an 59 y.o. female.  HPI: Debra Schmidt was admitted and operated on yesterday for spinal abscess. She notes that she has had a wound on her right shoulder for 2 weeks that she got when she fell in the shower that won't heal. She denies any significant pain or mobility restrictions. She is LHD.  History reviewed. No pertinent past medical history.  Past Surgical History:  Procedure Laterality Date  . LUMBAR LAMINECTOMY/DECOMPRESSION MICRODISCECTOMY N/A 04/23/2020   Procedure: Lumbar One-Two LAMINECTOMY for Epidural Abscess Evacuation;  Surgeon: Ashok Pall, MD;  Location: [facility];  Service: Neurosurgery;  Laterality: N/A;    No family history on file.  Social History:  reports that she has been smoking cigarettes. She has been smoking about 0.50 packs per day. She has never used smokeless tobacco. She reports previous alcohol use. She reports previous drug use.  Allergies:  Allergies  Allergen Reactions  . Penicillins Hives    Medications: I have reviewed the patient's current medications.  Results for orders placed or performed during the hospital encounter of 04/23/20 (from the past 48 hour(s))  Urine rapid drug screen (hosp performed)     Status: None   Collection Time: 04/23/20  7:37 AM  Result Value Ref Range   Opiates NONE DETECTED NONE DETECTED   Cocaine NONE DETECTED NONE DETECTED   Benzodiazepines NONE DETECTED NONE DETECTED   Amphetamines NONE DETECTED NONE DETECTED   Tetrahydrocannabinol NONE DETECTED NONE DETECTED   Barbiturates NONE DETECTED NONE DETECTED    Comment: (NOTE) DRUG SCREEN FOR MEDICAL PURPOSES ONLY.  IF CONFIRMATION IS NEEDED FOR ANY PURPOSE, NOTIFY LAB WITHIN 5 DAYS.  LOWEST DETECTABLE LIMITS FOR URINE DRUG SCREEN Drug Class                     Cutoff (ng/mL) Amphetamine and metabolites    1000 Barbiturate and metabolites    200 Benzodiazepine                  765 Tricyclics and metabolites     300 Opiates and metabolites        300 Cocaine and metabolites        300 THC                            50 Performed at [facility], [place] [address], [place], [place] [address]   Lipase, blood     Status: None   Collection Time: 04/23/20  1:39 PM  Result Value Ref Range   Lipase 25 11 - 51 U/L    Comment: Performed at [place] [address], [place],  [address]  Comprehensive metabolic panel     Status: Abnormal   Collection Time: 04/23/20  1:39 PM  Result Value Ref Range   Sodium 140 135 - 145 mmol/L   Potassium 2.0 (LL) 3.5 - 5.1 mmol/L    Comment: CRITICAL RESULT CALLED TO, READ BACK BY AND VERIFIED WITH: RN J BLUE AT 1430 04/23/20 BY L BENFIELD    Chloride 97 (L) 98 - 111 mmol/L   CO2 27 22 - 32 mmol/L   Glucose, Bld 143 (H) 70 - 99 mg/dL    Comment: Glucose reference range applies only to samples taken after fasting for at least 8 hours.   BUN 5 (L) 6 - 20 mg/dL   Creatinine,  Ser 0.79 0.44 - 1.00 mg/dL   Calcium 9.4 8.9 - 16.110.3 mg/dL   Total Protein 7.7 6.5 - 8.1 g/dL   Albumin 2.4 (L) 3.5 - 5.0 g/dL   AST 19 15 - 41 U/L   ALT 13 0 - 44 U/L   Alkaline Phosphatase 74 38 - 126 U/L   Total Bilirubin 0.6 0.3 - 1.2 mg/dL   GFR calc non Af Amer >60 >60 mL/min   GFR calc Af Amer >60 >60 mL/min   Anion gap 16 (H) 5 - 15    Comment: Performed at [facility], [address] [place], [place] [address]  CBC     Status: Abnormal   Collection Time: 04/23/20  1:39 PM  Result Value Ref Range   WBC 9.1 4.0 - 10.5 K/uL   RBC 3.80 (L) 3.87 - 5.11 MIL/uL   Hemoglobin 10.4 (L) 12.0 - 15.0 g/dL   HCT 54.032.2 (L) 36 - 46 %   MCV 84.7 80.0 - 100.0 fL   MCH 27.4 26.0 - 34.0 pg   MCHC 32.3 30.0 - 36.0 g/dL   RDW 98.115.2 19.111.5 - 47.815.5 %   Platelets 407 (H) 150 - 400 K/uL   nRBC 0.0 0.0 - 0.2 %    Comment: Performed at [facility], [address] [place], [place] [address]  Brain natriuretic peptide     Status: Abnormal    Collection Time: 04/23/20  1:39 PM  Result Value Ref Range   B Natriuretic Peptide 275.3 (H) 0.0 - 100.0 pg/mL    Comment: Performed at [facility], [address] [place], [place] [address]  Magnesium     Status: Abnormal   Collection Time: 04/23/20  1:39 PM  Result Value Ref Range   Magnesium 1.6 (L) 1.7 - 2.4 mg/dL    Comment: Performed at [facility], [address] [place], [place] [address]  Urinalysis, Routine w reflex microscopic     Status: None   Collection Time: 04/23/20  3:53 PM  Result Value Ref Range   Color, Urine YELLOW YELLOW   APPearance CLEAR CLEAR   Specific Gravity, Urine 1.009 1.005 - 1.030   pH 6.0 5.0 - 8.0   Glucose, UA NEGATIVE NEGATIVE mg/dL   Hgb urine dipstick NEGATIVE NEGATIVE   Bilirubin Urine NEGATIVE NEGATIVE   Ketones, ur NEGATIVE NEGATIVE mg/dL   Protein, ur NEGATIVE NEGATIVE mg/dL   Nitrite NEGATIVE NEGATIVE   Leukocytes,Ua NEGATIVE NEGATIVE    Comment: Performed at [facility], [address] [place], [place] [address]  SARS Coronavirus 2 by RT PCR (hospital order, performed in [facility] hospital lab) Nasopharyngeal Nasopharyngeal Swab     Status: None   Collection Time: 04/23/20  6:51 PM   Specimen: Nasopharyngeal Swab  Result Value Ref Range   SARS Coronavirus 2 NEGATIVE NEGATIVE    Comment: (NOTE) SARS-CoV-2 target nucleic acids are NOT DETECTED.  The SARS-CoV-2 RNA is generally detectable in upper and lower respiratory specimens during the acute phase of infection. The lowest concentration of SARS-CoV-2 viral copies this assay can detect is 250 copies / mL. A negative result does not preclude SARS-CoV-2 infection and should not be used as the sole basis for treatment or other patient management decisions.  A negative result may occur with improper specimen collection / handling, submission of specimen other than nasopharyngeal swab, presence of viral mutation(s) within the areas targeted by this assay, and  inadequate number of  viral copies (<250 copies / mL). A negative result must be combined with clinical observations, patient history, and epidemiological information.  Fact Sheet for Patients:   BoilerBrush.com.cy  Fact Sheet for Healthcare Providers: https://pope.com/  This test is not yet approved or  cleared by the [place] FDA and has been authorized for detection and/or diagnosis of SARS-CoV-2 by FDA under an Emergency Use Authorization (EUA).  This EUA will remain in effect (meaning this test can be used) for the duration of the COVID-19 declaration under Section 564(b)(1) of the Act, 21 U.S.C. section 360bbb-3(b)(1), unless the authorization is terminated or revoked sooner.  Performed at [facility], [address], [place], [place] [address]   Blood culture (routine x 2)     Status: None (Preliminary result)   Collection Time: 04/23/20  7:30 PM   Specimen: BLOOD  Result Value Ref Range   Specimen Description BLOOD RIGHT ANTECUBITAL    Special Requests      BOTTLES DRAWN AEROBIC AND ANAEROBIC Blood Culture adequate volume   Culture      NO GROWTH < 24 HOURS Performed at [facility], [address] [place], [place] [address]    Report Status PENDING   Blood culture (routine x 2)     Status: None (Preliminary result)   Collection Time: 04/23/20  7:35 PM   Specimen: BLOOD RIGHT FOREARM  Result Value Ref Range   Specimen Description BLOOD RIGHT FOREARM    Special Requests      BOTTLES DRAWN AEROBIC AND ANAEROBIC Blood Culture results may not be optimal due to an inadequate volume of blood received in culture bottles   Culture      NO GROWTH < 24 HOURS Performed at [facility], [address] [place], [place] [address]    Report Status PENDING   Basic metabolic panel     Status: Abnormal   Collection Time: 04/23/20  8:54 PM  Result Value Ref Range   Sodium 140 135 - 145 mmol/L   Potassium 2.0  (LL) 3.5 - 5.1 mmol/L    Comment: CRITICAL RESULT CALLED TO, READ BACK BY AND VERIFIED WITH: Brett Canales RN [id number] [address]TT    Chloride 99 98 - 111 mmol/L   CO2 28 22 - 32 mmol/L   Glucose, Bld 161 (H) 70 - 99 mg/dL    Comment: Glucose reference range applies only to samples taken after fasting for at least 8 hours.   BUN <5 (L) 6 - 20 mg/dL   Creatinine, Ser 4.49 0.44 - 1.00 mg/dL   Calcium 9.4 8.9 - 67.5 mg/dL   GFR calc non Af Amer >60 >60 mL/min   GFR calc Af Amer >60 >60 mL/min   Anion gap 13 5 - 15    Comment: Performed at [facility], [address], [place], [place] [address]  I-stat chem 8, ED (not at [facility] or [facility])     Status: Abnormal   Collection Time: 04/23/20  8:56 PM  Result Value Ref Range   Sodium 143 135 - 145 mmol/L   Potassium 2.0 (LL) 3.5 - 5.1 mmol/L   Chloride 99 98 - 111 mmol/L   BUN 3 (L) 6 - 20 mg/dL   Creatinine, Ser 4.66 0.44 - 1.00 mg/dL   Glucose, Bld 599 (H) 70 - 99 mg/dL    Comment: Glucose reference range applies only to samples taken after fasting for at least 8 hours.   Calcium, Ion  1.18 1.15 - 1.40 mmol/L   TCO2 29 22 - 32 mmol/L   Hemoglobin 10.2 (L) 12.0 - 15.0 g/dL   HCT 16.1 (L) 36 - 46 %  Aerobic/Anaerobic Culture (surgical/deep wound)     Status: None (Preliminary result)   Collection Time: 04/23/20 10:37 PM   Specimen: Abscess  Result Value Ref Range   Specimen Description ABSCESS    Special Requests THORACOULMBAR    Gram Stain      FEW WBC PRESENT, PREDOMINANTLY PMN FEW GRAM POSITIVE COCCI CRITICAL RESULT CALLED TO, READ BACK BY AND VERIFIED WITH: RN T ROWE  04/24/20 BY S GEZAHEGN Performed at [facility], [address], [place], [place] [address]    Culture PENDING    Report Status PENDING   Aerobic/Anaerobic Culture (surgical/deep wound)     Status: None (Preliminary result)   Collection Time: 04/23/20 10:37 PM   Specimen: Soft Tissue, Other  Result Value Ref Range   Specimen Description TISSUE     Special Requests EPIDURAL    Gram Stain      NO WBC SEEN NO ORGANISMS SEEN Performed at [facility], [address], [place], [place] [address]    Culture PENDING    Report Status PENDING   MRSA PCR Screening     Status: None   Collection Time: 04/24/20  1:50 AM   Specimen: Nasal Mucosa; Nasopharyngeal  Result Value Ref Range   MRSA by PCR NEGATIVE NEGATIVE    Comment:        The GeneXpert MRSA Assay (FDA approved for NASAL specimens only), is one component of a comprehensive MRSA colonization surveillance program. It is not intended to diagnose MRSA infection nor to guide or monitor treatment for MRSA infections. Performed at [facility], [address] [place], [place] [address]   Glucose, capillary     Status: Abnormal   Collection Time: 04/24/20  5:50 AM  Result Value Ref Range   Glucose-Capillary 109 (H) 70 - 99 mg/dL    Comment: Glucose reference range applies only to samples taken after fasting for at least 8 hours.  Hemoglobin A1c     Status: Abnormal   Collection Time: 04/24/20  6:05 AM  Result Value Ref Range   Hgb A1c MFr Bld 6.7 (H) 4.8 - 5.6 %    Comment: (NOTE) Pre diabetes:          5.7%-6.4%  Diabetes:              >6.4%  Glycemic control for   <7.0% adults with diabetes    Mean Plasma Glucose 145.59 mg/dL    Comment: Performed at [facility], [address], [place], [place] [address]  Comprehensive metabolic panel     Status: Abnormal   Collection Time: 04/24/20  6:05 AM  Result Value Ref Range   Sodium 140 135 - 145 mmol/L   Potassium 2.2 (LL) 3.5 - 5.1 mmol/L    Comment: CRITICAL RESULT CALLED TO, READ BACK BY AND VERIFIED WITH: R COE RN [phone] BY A BENNETT    Chloride 101 98 - 111 mmol/L   CO2 29 22 - 32 mmol/L   Glucose, Bld 120 (H) 70 - 99 mg/dL    Comment: Glucose reference range applies only to samples taken after fasting for at least 8 hours.   BUN <5 (L) 6 - 20 mg/dL   Creatinine, Ser 4.69 0.44 - 1.00 mg/dL    Calcium 8.8 (L) 8.9 -  10.3 mg/dL   Total Protein 6.4 (L) 6.5 - 8.1 g/dL   Albumin 1.9 (L) 3.5 - 5.0 g/dL   AST 18 15 - 41 U/L   ALT 11 0 - 44 U/L   Alkaline Phosphatase 64 38 - 126 U/L   Total Bilirubin 0.7 0.3 - 1.2 mg/dL   GFR calc non Af Amer >60 >60 mL/min   GFR calc Af Amer >60 >60 mL/min   Anion gap 10 5 - 15    Comment: Performed at [facility], [address], [place], [place] [address]  CBC     Status: Abnormal   Collection Time: 04/24/20  6:05 AM  Result Value Ref Range   WBC 11.3 (H) 4.0 - 10.5 K/uL   RBC 3.22 (L) 3.87 - 5.11 MIL/uL   Hemoglobin 8.7 (L) 12.0 - 15.0 g/dL   HCT 77.9 (L) 36 - 46 %   MCV 84.8 80.0 - 100.0 fL   MCH 27.0 26.0 - 34.0 pg   MCHC 31.9 30.0 - 36.0 g/dL   RDW 39.0 30.0 - 92.3 %   Platelets 366 150 - 400 K/uL   nRBC 0.0 0.0 - 0.2 %    Comment: Performed at [facility], [address], [place], [place] [address]  Sedimentation rate     Status: Abnormal   Collection Time: 04/24/20  6:05 AM  Result Value Ref Range   Sed Rate 110 (H) 0 - 22 mm/hr    Comment: Performed at [facility], [address] [place], [place] [address]  Magnesium     Status: None   Collection Time: 04/24/20  6:05 AM  Result Value Ref Range   Magnesium 1.8 1.7 - 2.4 mg/dL    Comment: Performed at [facility], [address], [place], [place] [address]  Phosphorus     Status: None   Collection Time: 04/24/20 10:39 AM  Result Value Ref Range   Phosphorus 3.8 2.5 - 4.6 mg/dL    Comment: Performed at [facility], [address] [place], [place] [address]    DG Thoracolumabar Spine  Result Date: 04/24/2020 CLINICAL DATA:  L1-2 laminectomy EXAM: THORACOLUMBAR SPINE 1V COMPARISON:  Intraoperative imaging earlier FINDINGS: Single cross-table lateral view of the lumbar spine demonstrates posterior surgical instruments directed at the L1-2 level. IMPRESSION: Intraoperative localization as above. Electronically Signed   By: Charlett Nose M.D.    On: 04/24/2020 01:11   DG Thoracolumabar Spine  Result Date: 04/23/2020 CLINICAL DATA:  Elective surgery EXAM: THORACOLUMBAR SPINE 1V COMPARISON:  None. FINDINGS: Cross-table lateral views of the lumbar spine are submitted. Final image demonstrates posterior surgical instruments directed at the T12-L1 level. IMPRESSION: Intraoperative localization as above. Electronically Signed   By: Charlett Nose M.D.   On: 04/23/2020 23:30   MR THORACIC SPINE WO CONTRAST  Addendum Date: 04/23/2020   ADDENDUM REPORT: 04/23/2020 18:36 ADDENDUM: Critical findings discussed by telephone with Dr. Gwyneth Sprout on 04/23/2020 at 18:26. Electronically Signed   By: Odessa Fleming M.D.   On: 04/23/2020 18:36   Result Date: 04/23/2020 CLINICAL DATA:  59 year old female with low back pain, urinary incontinence. EXAM: MRI THORACIC SPINE WITHOUT CONTRAST TECHNIQUE: Multiplanar, multisequence MR imaging of the thoracic spine was performed. No intravenous contrast was administered. COMPARISON:  Lumbar MRI today reported separately. CT Abdomen and Pelvis 02/24/2020. FINDINGS: Limited cervical spine imaging:  Negative. Thoracic spine segmentation: Appears to be normal, and concordant with the lumbar numbering today.  Alignment: Mildly exaggerated thoracic kyphosis. No spondylolisthesis. Vertebrae: No acute osseous abnormality identified in the thoracic spine, thoracic marrow signal appears to remain normal. Abnormal L1 and L2 levels, detailed separately today. Cord:  Axial images degraded by motion. Abnormal thoracic spinal cord T2 and STIR hyperintensity within the central cord beginning at the T8 vertebral level (series 5, image 12, series 8, image 31) and continuing to the cauda equina. Mildly abnormal central cord signal may extend as high as the T4 level, but above that the cord appears within normal limits. Superimposed bulky abnormal intraspinal T2 and STIR hyperintense signal in the lower thoracic spinal canal beginning at T10-T11  (series 6, image 10) and continuing into the lumbar spinal canal. Axial images through the segments are degraded by motion but the cord appears primarily displaced and compressed posteriorly by the bulky fluid signal space-occupying mass (series 12, image 29). The dorsal thoracic epidural space is probably abnormal as high as the T9 level. Paraspinal and other soft tissues: The lower thoracic paraspinal soft tissues become abnormal at the T11-T12 level, with STIR hyperintensity compatible with inflammation. Abnormality continues into the lumbar spine reported separately. Grossly negative visible thoracic and upper abdominal viscera. Disc levels: Mild thoracic spine degeneration with the exception of T8-T9 where disc desiccation and disc space loss are accompanied by disc bulging. No spinal or foraminal stenosis associated. IMPRESSION: 1. Positive for compression of the lower thoracic spinal cord and conus by what appears to be a bulky multiloculated intraspinal fluid collection emanating from the abnormal lumbar L1-L2 level (detailed separately). 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 2. Severe abnormal signal in the spinal cord - especially from T8 inferiorly - could be cord edema from compression, but consider also Venous Cord Infarction from septic spinal venous thrombophlebitis. 3. Recommend Neurosurgery, Neurology, and Infectious Disease consultation. Electronically Signed: By: Odessa Fleming M.D. On: 04/23/2020 18:20   MR LUMBAR SPINE WO CONTRAST  Result Date: 04/23/2020 CLINICAL DATA:  59 year old female with low back pain, urinary incontinence. EXAM: MRI LUMBAR SPINE WITHOUT CONTRAST TECHNIQUE: Multiplanar, multisequence MR imaging of the lumbar spine was performed. No intravenous contrast was administered. COMPARISON:  CT Abdomen and Pelvis 03/25/2020. Thoracic spine MRI today reported separately. FINDINGS: Segmentation: Normal, concordant with the thoracic spine numbering  today. Alignment: Stable lumbar lordosis, subtle new retrolisthesis of L2 on L3. Vertebrae: Confluent vertebral body edema at L2 and L3 with intervening abnormal disc space with fluid and endplate erosions. The posterior elements of these levels appear relatively spared although there is abnormal facet joint fluid bilaterally, which is not seen at the adjacent spinal levels. No other marrow edema or evidence of acute osseous abnormality. Intact visible sacrum and SI joints. Conus medullaris and cauda equina: Highly abnormal as described on the thoracic MRI today. Axial images on this exam are also degraded by motion, but the abnormal cord can be seen displaced dorsally beginning at the T11 level (series 3, image 7). The conus medullaris is difficult to delineate. And there is ventral (lower thoracic) and complete (L1 and lower) filling of the spinal canal by multiloculated appearing T2 and STIR hyperintense material which is likely abscess. Severe spinal stenosis is present from L1 through L4. Possibly more normal appearance of the thecal sac beginning at L5, although the cauda equina nerve roots there are difficult to delineate. Paraspinal and other soft tissues: Positive for lateral paraspinal abscesses beginning at the T12 level and maximal at L1-L2-greater on the right (series 3, image 3). The individual  paraspinal abscesses are up to 24 mm. Associated edema in the bilateral upper lumbar psoas muscles, which become more normal by the L4 level. Grossly negative visible abdominal viscera. Posteriorly there is bilateral erector spinae muscle edema, although no posterior paraspinal abscess. Disc levels: Outside of L1-L2, the intervertebral discs appear normal except for chronic degeneration at L3-L4 and L4-L5. There is superimposed degenerative bilateral lumbar facet hypertrophy, maximal at L4-L5 where facet joint fluid is also noted. IMPRESSION: 1. 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 Septic arthritis of the L1-L2 facets is also possible. 2. 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 3. Superimposed lateral paraspinal soft tissue abscesses, greater on the right maximal at L1-L2 and individually up to 2.4 cm. Salient findings discussed by telephone with Dr. Gwyneth Sprout in the ED on 04/23/2020 at 18:28 . Electronically Signed   By: Odessa Fleming M.D.   On: 04/23/2020 18:34   CT SHOULDER RIGHT W CONTRAST  Result Date: 04/24/2020 CLINICAL DATA:  Fall several weeks ago with right shoulder wound now with surrounding redness and purulent drainage. Admitted with L1-L2 osteomyelitis-discitis and large epidural abscess. EXAM: CT OF THE UPPER RIGHT EXTREMITY WITH CONTRAST TECHNIQUE: Multidetector CT imaging of the right shoulder was performed according to the standard protocol following intravenous contrast administration. COMPARISON:  None. CONTRAST:  75mL OMNIPAQUE IOHEXOL 300 MG/ML  SOLN FINDINGS: Bones/Joint/Cartilage Erosive changes of the distal clavicle and acromion. Acromioclavicular joint effusion with distention of the joint capsule. No fracture or dislocation. No glenohumeral joint effusion. Small amount of fluid in the subacromial/subdeltoid bursa. Ligaments Ligaments are suboptimally evaluated by CT. Muscles and Tendons Grossly intact.  No muscle atrophy. Soft tissue Soft tissue ulceration on the top of the shoulder with underlying inflammatory changes extending to the acromioclavicular joint. No discrete fluid collection. Small reactive right axillary lymph nodes. IMPRESSION: 1. Soft tissue ulceration on the top of the shoulder with underlying acromioclavicular septic arthritis and osteomyelitis of the acromion and distal clavicle. 2. Small amount of fluid in the subacromial/subdeltoid bursa. Septic bursitis is not excluded. 3. No  glenohumeral erosive changes or joint effusion. Electronically Signed   By: Obie Dredge M.D.   On: 04/24/2020 10:15    Review of Systems  HENT: Negative for ear discharge, ear pain, hearing loss and tinnitus.   Eyes: Negative for photophobia and pain.  Respiratory: Negative for cough and shortness of breath.   Cardiovascular: Negative for chest pain.  Gastrointestinal: Negative for abdominal pain, nausea and vomiting.  Genitourinary: Negative for dysuria, flank pain, frequency and urgency.  Musculoskeletal: Negative for arthralgias, back pain, myalgias and neck pain.  Skin: Positive for wound.  Neurological: Positive for weakness. Negative for dizziness and headaches.  Hematological: Does not bruise/bleed easily.  Psychiatric/Behavioral: The patient is not nervous/anxious.    Blood pressure (!) 180/95, pulse 91, temperature 99.3 F (37.4 C), temperature source Axillary, resp. rate (!) 29, height  (1.626 m), weight 62.6 kg, SpO2 95 %. Physical Exam  Constitutional: She appears well-developed. No distress.  HENT:  Head: Normocephalic and atraumatic.  Eyes: Conjunctivae are normal. Right eye exhibits no discharge. Left eye exhibits no discharge. No scleral icterus.  Cardiovascular: Normal rate and regular rhythm.  Respiratory: Effort normal. No respiratory distress.  Musculoskeletal:     Cervical back: Normal range of motion.     Comments: Right shoulder, elbow,  wrist, digits- Ulceration over AC joint, nontender, no instability, no blocks to motion  Sens  Ax/R/M/U intact  Mot   Ax/ R/ PIN/ M/ AIN/ U intact  Rad 2+  Neurological: She is alert.  Skin: Skin is warm and dry. She is not diaphoretic.  Psychiatric: Her behavior is normal.    Assessment/Plan: Right AC septic arthritis w/distal clav and acromion osteo -- Plan on ID, distal clavicle resection by Dr. Eulah Pont tomorrow. NPO after MN. Will ask ID to see as she will likely need long-term abx treatment.    Freeman Caldron, PA-C Orthopedic Surgery [phone] 04/24/2020, 11:35 AM

## 2020-04-24 NOTE — Consult Note (Signed)
[facility]    Date of Admission:  04/23/2020           Day 2 vancomycin        Day 2 piperacillin tazobactam       Reason for Consult: Thoracolumbar epidural abscess, septic arthritis of the right [facility] joint with associated osteomyelitis    Referring Provider: Earney Hamburg, PA-C  Assessment: I suspect that her back and shoulder infections are due to the same organism.  Her operative Gram stain suggests staph.  I will continue vancomycin and stopping piperacillin tazobactam now.  Plan: 1. Continue vancomycin alone pending final culture results 2. Hold off on PICC placement for now pending final blood culture results  Principal Problem:   Abscess in epidural space of lumbar spine Active Problems:   Septic arthritis of right acromioclavicular joint (HCC)   Osteomyelitis of clavicle (HCC)   Hypokalemia   Hypomagnesemia   Status post surgery   Cigarette smoker   Scheduled Meds: . amLODipine  5 mg Oral Daily  . Chlorhexidine Gluconate Cloth  6 each Topical Daily  . docusate sodium  100 mg Oral BID  . ketorolac  15 mg Intravenous Q6H  . oxyCODONE  15 mg Oral Q12H  . pantoprazole (PROTONIX) IV  40 mg Intravenous QHS  . potassium chloride  40 mEq Oral Once  . potassium chloride  40 mEq Oral TID  . sodium chloride flush  10-40 mL Intracatheter Q12H  . sodium chloride flush  3 mL Intravenous Q12H   Continuous Infusions: . sodium chloride    . 0.9 % NaCl with KCl 20 mEq / L 80 mL/hr at 04/24/20 1400  . piperacillin-tazobactam (ZOSYN)  IV Stopped (04/24/20 1210)  . vancomycin 150 mL/hr at 04/24/20 1400   PRN Meds:.acetaminophen **OR** acetaminophen, bisacodyl, diazepam, magnesium citrate, menthol-cetylpyridinium **OR** phenol, morphine injection, ondansetron **OR** ondansetron (ZOFRAN) IV, oxyCODONE, oxyCODONE, phenazopyridine, senna-docusate, sodium chloride flush, sodium chloride flush, zolpidem  HPI: Debra Schmidt is a 59 y.o. female who  fell in the bathtub several weeks ago injuring her right shoulder.  She states that her shoulder does not hurt but she eventually developed a draining wound on the top of her shoulder.  She began to have progressively more severe back pain left urinary incontinence leading to admission yesterday.  MRI showed L1-2 discitis with thoracolumbar abscess.  She underwent emergent surgery yesterday.  Operative Gram stain shows gram-positive cocci.  CT scan of her right shoulder shows acromioclavicular septic arthritis and clavicular osteomyelitis.   Review of Systems: Review of Systems  Constitutional: Positive for chills and malaise/fatigue. Negative for diaphoresis and fever.  Musculoskeletal: Positive for back pain. Negative for joint pain.  Psychiatric/Behavioral: Negative for substance abuse.    History reviewed. No pertinent past medical history.  Social History   Tobacco Use  . Smoking status: Current Every Day Smoker    Packs/day: 0.50    Types: Cigarettes  . Smokeless tobacco: Never Used  Substance Use Topics  . Alcohol use: Not Currently  . Drug use: Not Currently    No family history on file. Allergies  Allergen Reactions  . Penicillins Hives    OBJECTIVE: Blood pressure (!) 177/90, pulse 94, temperature 98.9 F (37.2 C), temperature source Oral, resp. rate (!) 24, height 5\' 4"  (1.626 m), weight 62.6 kg, SpO2 97 %.  Physical Exam Constitutional:      Comments: She is very groggy.  Cardiovascular:  Rate and Rhythm: Normal rate and regular rhythm.     Heart sounds: No murmur heard.   Pulmonary:     Effort: Pulmonary effort is normal.     Breath sounds: Normal breath sounds.  Abdominal:     Palpations: Abdomen is soft.     Tenderness: There is no abdominal tenderness.  Musculoskeletal:        General: Swelling and tenderness present.     Lab Results Lab Results  Component Value Date   WBC 11.3 (H) 04/24/2020   HGB 8.7 (L) 04/24/2020   HCT 27.3 (L)  04/24/2020   MCV 84.8 04/24/2020   PLT 366 04/24/2020    Lab Results  Component Value Date   CREATININE 0.69 04/24/2020   BUN <5 (L) 04/24/2020   NA 140 04/24/2020   K 2.2 (LL) 04/24/2020   CL 101 04/24/2020   CO2 29 04/24/2020    Lab Results  Component Value Date   ALT 11 04/24/2020   AST 18 04/24/2020   ALKPHOS 64 04/24/2020   BILITOT 0.7 04/24/2020     Microbiology: Recent Results (from the past 240 hour(s))  SARS Coronavirus 2 by RT PCR (hospital order, performed in [place] hospital lab) Nasopharyngeal Nasopharyngeal Swab     Status: None   Collection Time: 04/23/20  6:51 PM   Specimen: Nasopharyngeal Swab  Result Value Ref Range Status   SARS Coronavirus 2 NEGATIVE NEGATIVE Final    Comment: (NOTE) SARS-CoV-2 target nucleic acids are NOT DETECTED.  The SARS-CoV-2 RNA is generally detectable in upper and lower respiratory specimens during the acute phase of infection. The lowest concentration of SARS-CoV-2 viral copies this assay can detect is 250 copies / mL. A negative result does not preclude SARS-CoV-2 infection and should not be used as the sole basis for treatment or other patient management decisions.  A negative result may occur with improper specimen collection / handling, submission of specimen other than nasopharyngeal swab, presence of viral mutation(s) within the areas targeted by this assay, and inadequate number of viral copies (<250 copies / mL). A negative result must be combined with clinical observations, patient history, and epidemiological information.  Fact Sheet for Patients:   StrictlyIdeas.no  Fact Sheet for Healthcare Providers: BankingDealers.co.za  This test is not yet approved or  cleared by the [place] FDA and has been authorized for detection and/or diagnosis of SARS-CoV-2 by FDA under an Emergency Use Authorization (EUA).  This EUA will remain in effect (meaning this test  can be used) for the duration of the COVID-19 declaration under Section 564(b)(1) of the Act, 21 U.S.C. section 360bbb-3(b)(1), unless the authorization is terminated or revoked sooner.  Performed at [facility], [place] [address], [place], [place] [address]   Blood culture (routine x 2)     Status: None (Preliminary result)   Collection Time: 04/23/20  7:30 PM   Specimen: BLOOD  Result Value Ref Range Status   Specimen Description BLOOD RIGHT ANTECUBITAL  Final   Special Requests   Final    BOTTLES DRAWN AEROBIC AND ANAEROBIC Blood Culture adequate volume   Culture   Final    NO GROWTH < 24 HOURS Performed at [facility], [place] [address], [place], [place] [address]    Report Status PENDING  Incomplete  Blood culture (routine x 2)     Status: None (Preliminary result)   Collection Time: 04/23/20  7:35 PM   Specimen: BLOOD RIGHT FOREARM  Result Value Ref  Range Status   Specimen Description BLOOD RIGHT FOREARM  Final   Special Requests   Final    BOTTLES DRAWN AEROBIC AND ANAEROBIC Blood Culture results may not be optimal due to an inadequate volume of blood received in culture bottles   Culture   Final    NO GROWTH < 24 HOURS Performed at [facility], [address] [place], [place] [address]    Report Status PENDING  Incomplete  Aerobic/Anaerobic Culture (surgical/deep wound)     Status: None (Preliminary result)   Collection Time: 04/23/20 10:37 PM   Specimen: Abscess  Result Value Ref Range Status   Specimen Description ABSCESS  Final   Special Requests THORACOULMBAR  Final   Gram Stain   Final    FEW WBC PRESENT, PREDOMINANTLY PMN FEW GRAM POSITIVE COCCI CRITICAL RESULT CALLED TO, READ BACK BY AND VERIFIED WITH: RN T ROWE @0107  04/24/20 BY S GEZAHEGN Performed at [facility], [address] [place], [place]    Culture PENDING  Incomplete   Report Status PENDING  Incomplete  Aerobic/Anaerobic Culture (surgical/deep wound)     Status:  None (Preliminary result)   Collection Time: 04/23/20 10:37 PM   Specimen: Soft Tissue, Other  Result Value Ref Range Status   Specimen Description TISSUE  Final   Special Requests EPIDURAL  Final   Gram Stain   Final    NO WBC SEEN NO ORGANISMS SEEN Performed at [facility], [address], [place], [place]    Culture PENDING  Incomplete   Report Status PENDING  Incomplete  MRSA PCR Screening     Status: None   Collection Time: 04/24/20  1:50 AM   Specimen: Nasal Mucosa; Nasopharyngeal  Result Value Ref Range Status   MRSA by PCR NEGATIVE NEGATIVE Final    Comment:        The GeneXpert MRSA Assay (FDA approved for NASAL specimens only), is one component of a comprehensive MRSA colonization surveillance program. It is not intended to diagnose MRSA infection nor to guide or monitor treatment for MRSA infections. Performed at [facility], [address], [place], [place]     [id number], MD [facility] [phone] pager   [phone] cell 04/24/2020, 2:17 PM

## 2020-04-24 NOTE — Progress Notes (Signed)
Pharmacy Antibiotic Note  Debra Schmidt is a 59 y.o. female admitted on 04/23/2020 with epidural abscess.  Pharmacy has been consulted for vancomycin and zosyn dosing.  Plan: Vancomycin 1250 mg IV x 1 then 750 mg IV q12 hours Zosyn EI F/u renal function, cultures and clinical course  Height: 5\' 4"  (162.6 cm) Weight: 62.6 kg (138 lb) IBW/kg (Calculated) : 54.7  Temp (24hrs), Avg:98.1 F (36.7 C), Min:97.5 F (36.4 C), Max:98.7 F (37.1 C)  Recent Labs  Lab 04/23/20 1339 04/23/20 2054 04/23/20 2056  WBC 9.1  --   --   CREATININE 0.79 0.78 0.60    Estimated Creatinine Clearance: 65.4 mL/min (by C-G formula based on SCr of 0.6 mg/dL).    Allergies  Allergen Reactions  . Penicillins Hives    Thank you for allowing pharmacy to be a part of this patient's care.  04/25/20 [place] 04/24/2020 1:56 AM

## 2020-04-25 ENCOUNTER — Encounter (HOSPITAL_COMMUNITY)
Admission: EM | Disposition: A | Discharge status: Rehabilitation Facility | Payer: Self-pay | Source: Home / Self Care | Attending: Neurosurgery

## 2020-04-25 ENCOUNTER — Inpatient Hospital Stay (HOSPITAL_COMMUNITY): Payer: Medicaid Other | Admitting: Anesthesiology

## 2020-04-25 ENCOUNTER — Encounter (HOSPITAL_COMMUNITY): Payer: Self-pay | Admitting: Neurosurgery

## 2020-04-25 DIAGNOSIS — B9561 Methicillin susceptible Staphylococcus aureus infection as the cause of diseases classified elsewhere: Secondary | ICD-10-CM

## 2020-04-25 DIAGNOSIS — R7881 Bacteremia: Secondary | ICD-10-CM

## 2020-04-25 HISTORY — PX: RESECTION DISTAL CLAVICAL: SHX5053

## 2020-04-25 HISTORY — PX: ACROMIO-CLAVICULAR JOINT REPAIR: SHX5183

## 2020-04-25 HISTORY — PX: I & D EXTREMITY: SHX5045

## 2020-04-25 LAB — POCT I-STAT 7, (LYTES, BLD GAS, ICA,H+H)
Acid-Base Excess: 8 mmol/L — ABNORMAL HIGH (ref 0.0–2.0)
Bicarbonate: 33.1 mmol/L — ABNORMAL HIGH (ref 20.0–28.0)
Calcium, Ion: 1.22 mmol/L (ref 1.15–1.40)
HCT: 25 % — ABNORMAL LOW (ref 36.0–46.0)
Hemoglobin: 8.5 g/dL — ABNORMAL LOW (ref 12.0–15.0)
O2 Saturation: 100 %
Patient temperature: 39.3
Potassium: 2.1 mmol/L — CL (ref 3.5–5.1)
Sodium: 143 mmol/L (ref 135–145)
TCO2: 35 mmol/L — ABNORMAL HIGH (ref 22–32)
pCO2 arterial: 51.3 mmHg — ABNORMAL HIGH (ref 32.0–48.0)
pH, Arterial: 7.427 (ref 7.350–7.450)
pO2, Arterial: 330 mmHg — ABNORMAL HIGH (ref 83.0–108.0)

## 2020-04-25 LAB — POCT I-STAT, CHEM 8
BUN: 4 mg/dL — ABNORMAL LOW (ref 6–20)
Calcium, Ion: 1.18 mmol/L (ref 1.15–1.40)
Chloride: 97 mmol/L — ABNORMAL LOW (ref 98–111)
Creatinine, Ser: 0.4 mg/dL — ABNORMAL LOW (ref 0.44–1.00)
Glucose, Bld: 103 mg/dL — ABNORMAL HIGH (ref 70–99)
HCT: 27 % — ABNORMAL LOW (ref 36.0–46.0)
Hemoglobin: 9.2 g/dL — ABNORMAL LOW (ref 12.0–15.0)
Potassium: 2.7 mmol/L — CL (ref 3.5–5.1)
Sodium: 141 mmol/L (ref 135–145)
TCO2: 29 mmol/L (ref 22–32)

## 2020-04-25 LAB — BASIC METABOLIC PANEL
Anion gap: 11 (ref 5–15)
BUN: <5 mg/dL — ABNORMAL LOW (ref 6–20)
CO2: 28 mmol/L (ref 22–32)
Calcium: 8.5 mg/dL — ABNORMAL LOW (ref 8.9–10.3)
Chloride: 101 mmol/L (ref 98–111)
Creatinine, Ser: 0.55 mg/dL (ref 0.44–1.00)
GFR calc Af Amer: >60 mL/min (ref >60)
GFR calc non Af Amer: >60 mL/min (ref >60)
Glucose, Bld: 119 mg/dL — ABNORMAL HIGH (ref 70–99)
Potassium: 2.4 mmol/L — CL (ref 3.5–5.1)
Sodium: 140 mmol/L (ref 135–145)

## 2020-04-25 LAB — CBC
HCT: 27.6 % — ABNORMAL LOW (ref 36.0–46.0)
Hemoglobin: 8.9 g/dL — ABNORMAL LOW (ref 12.0–15.0)
MCH: 27.3 pg (ref 26.0–34.0)
MCHC: 32.2 g/dL (ref 30.0–36.0)
MCV: 84.7 fL (ref 80.0–100.0)
Platelets: 347 10*3/uL (ref 150–400)
RBC: 3.26 MIL/uL — ABNORMAL LOW (ref 3.87–5.11)
RDW: 15.3 % (ref 11.5–15.5)
WBC: 14.2 10*3/uL — ABNORMAL HIGH (ref 4.0–10.5)
nRBC: 0 % (ref 0.0–0.2)

## 2020-04-25 LAB — SURGICAL PCR SCREEN
MRSA, PCR: NEGATIVE
Staphylococcus aureus: NEGATIVE

## 2020-04-25 LAB — PHOSPHORUS: Phosphorus: 2.3 mg/dL — ABNORMAL LOW (ref 2.5–4.6)

## 2020-04-25 SURGERY — IRRIGATION AND DEBRIDEMENT EXTREMITY
Anesthesia: General | Site: Shoulder | Laterality: Right

## 2020-04-25 MED ORDER — VANCOMYCIN HCL 1000 MG IV SOLR
INTRAVENOUS | Status: AC
  Filled 2020-04-25: qty 1000

## 2020-04-25 MED ORDER — LIDOCAINE HCL (CARDIAC) PF 100 MG/5ML IV SOSY
PREFILLED_SYRINGE | INTRAVENOUS | Status: DC | PRN
  Administered 2020-04-25: 30 mg via INTRAVENOUS

## 2020-04-25 MED ORDER — CHLORHEXIDINE GLUCONATE 4 % EX LIQD
60.0000 mL | Freq: Once | CUTANEOUS | Status: DC
  Administered 2020-04-25: 4 via TOPICAL
  Filled 2020-04-25: qty 15
  Filled 2020-04-25 (×2): qty 60

## 2020-04-25 MED ORDER — FENTANYL CITRATE (PF) 100 MCG/2ML IJ SOLN
50.0000 ug | INTRAMUSCULAR | Status: AC | PRN
  Administered 2020-04-25: 50 ug via INTRAVENOUS

## 2020-04-25 MED ORDER — ONDANSETRON HCL 4 MG/2ML IJ SOLN: 4.0000 mg | Freq: Four times a day (QID) | INTRAMUSCULAR | Status: DC | PRN

## 2020-04-25 MED ORDER — CEFAZOLIN SODIUM-DEXTROSE 2-4 GM/100ML-% IV SOLN
2.0000 g | Freq: Three times a day (TID) | INTRAVENOUS | Status: DC
  Administered 2020-04-25 – 2020-05-09 (×41): 2 g via INTRAVENOUS
  Filled 2020-04-25 (×43): qty 100

## 2020-04-25 MED ORDER — FENTANYL CITRATE (PF) 100 MCG/2ML IJ SOLN
INTRAMUSCULAR | Status: AC
  Administered 2020-04-25: 50 ug via INTRAVENOUS
  Filled 2020-04-25: qty 2

## 2020-04-25 MED ORDER — SUGAMMADEX SODIUM 200 MG/2ML IV SOLN
INTRAVENOUS | Status: DC | PRN
  Administered 2020-04-25: 200 mg via INTRAVENOUS

## 2020-04-25 MED ORDER — PROPOFOL 10 MG/ML IV BOLUS
INTRAVENOUS | Status: AC
  Filled 2020-04-25: qty 40

## 2020-04-25 MED ORDER — CHLORHEXIDINE GLUCONATE 0.12 % MT SOLN
OROMUCOSAL | Status: AC
  Administered 2020-04-25: 15 mL
  Filled 2020-04-25: qty 15

## 2020-04-25 MED ORDER — POTASSIUM CHLORIDE CRYS ER 20 MEQ PO TBCR
40.0000 meq | EXTENDED_RELEASE_TABLET | ORAL | Status: AC
  Administered 2020-04-25: 40 meq via ORAL
  Filled 2020-04-25: qty 2

## 2020-04-25 MED ORDER — HYDROMORPHONE HCL 1 MG/ML IJ SOLN
0.2500 mg | INTRAMUSCULAR | Status: DC | PRN
  Administered 2020-04-25: 0.25 mg via INTRAVENOUS
  Administered 2020-04-25: 0.5 mg via INTRAVENOUS
  Administered 2020-04-25: 0.25 mg via INTRAVENOUS

## 2020-04-25 MED ORDER — 0.9 % SODIUM CHLORIDE (POUR BTL) OPTIME
TOPICAL | Status: DC | PRN
  Administered 2020-04-25: 1000 mL

## 2020-04-25 MED ORDER — MIDAZOLAM HCL 2 MG/2ML IJ SOLN
INTRAMUSCULAR | Status: AC
  Filled 2020-04-25: qty 2

## 2020-04-25 MED ORDER — LACTATED RINGERS IV SOLN: INTRAVENOUS | Status: DC | PRN

## 2020-04-25 MED ORDER — POTASSIUM CHLORIDE 10 MEQ/100ML IV SOLN
10.0000 meq | INTRAVENOUS | Status: AC
  Administered 2020-04-25 (×2): 10 meq via INTRAVENOUS
  Filled 2020-04-25: qty 100

## 2020-04-25 MED ORDER — VANCOMYCIN HCL 1000 MG IV SOLR
INTRAVENOUS | Status: DC | PRN
  Administered 2020-04-25: 1000 mg

## 2020-04-25 MED ORDER — PROPOFOL 10 MG/ML IV BOLUS
INTRAVENOUS | Status: DC | PRN
  Administered 2020-04-25: 150 mg via INTRAVENOUS

## 2020-04-25 MED ORDER — HYDROMORPHONE HCL 1 MG/ML IJ SOLN
INTRAMUSCULAR | Status: AC
  Filled 2020-04-25: qty 1

## 2020-04-25 MED ORDER — FENTANYL CITRATE (PF) 100 MCG/2ML IJ SOLN
INTRAMUSCULAR | Status: DC | PRN
  Administered 2020-04-25: 100 ug via INTRAVENOUS

## 2020-04-25 MED ORDER — FENTANYL CITRATE (PF) 250 MCG/5ML IJ SOLN
INTRAMUSCULAR | Status: AC
  Filled 2020-04-25: qty 5

## 2020-04-25 MED ORDER — DOCUSATE SODIUM 100 MG PO CAPS
100.0000 mg | ORAL_CAPSULE | Freq: Two times a day (BID) | ORAL | Status: DC
  Administered 2020-04-25 – 2020-05-09 (×27): 100 mg via ORAL
  Filled 2020-04-25 (×27): qty 1

## 2020-04-25 MED ORDER — BUPIVACAINE HCL (PF) 0.25 % IJ SOLN
INTRAMUSCULAR | Status: AC
  Filled 2020-04-25: qty 30

## 2020-04-25 MED ORDER — BUPIVACAINE HCL (PF) 0.5 % IJ SOLN
INTRAMUSCULAR | Status: AC
  Filled 2020-04-25: qty 30

## 2020-04-25 MED ORDER — ONDANSETRON HCL 4 MG PO TABS: 4.0000 mg | ORAL_TABLET | Freq: Four times a day (QID) | ORAL | Status: DC | PRN

## 2020-04-25 MED ORDER — ROCURONIUM BROMIDE 10 MG/ML (PF) SYRINGE
PREFILLED_SYRINGE | INTRAVENOUS | Status: DC | PRN
  Administered 2020-04-25: 50 mg via INTRAVENOUS

## 2020-04-25 MED ORDER — POTASSIUM CHLORIDE 10 MEQ/100ML IV SOLN
INTRAVENOUS | Status: AC
  Administered 2020-04-25: 10 meq
  Filled 2020-04-25: qty 100

## 2020-04-25 MED ORDER — ENSURE PRE-SURGERY PO LIQD
296.0000 mL | Freq: Once | ORAL | Status: DC
  Filled 2020-04-25: qty 296

## 2020-04-25 MED ORDER — LABETALOL HCL 5 MG/ML IV SOLN
INTRAVENOUS | Status: DC | PRN
  Administered 2020-04-25: 10 mg via INTRAVENOUS

## 2020-04-25 MED ORDER — POVIDONE-IODINE 10 % EX SWAB: 2.0000 "application " | Freq: Once | CUTANEOUS | Status: DC

## 2020-04-25 MED ORDER — ONDANSETRON HCL 4 MG/2ML IJ SOLN
INTRAMUSCULAR | Status: DC | PRN
  Administered 2020-04-25: 4 mg via INTRAVENOUS

## 2020-04-25 MED ORDER — SODIUM CHLORIDE 0.9 % IV SOLN: INTRAVENOUS | Status: DC

## 2020-04-25 MED ORDER — EPINEPHRINE PF 1 MG/ML IJ SOLN
INTRAMUSCULAR | Status: AC
  Filled 2020-04-25: qty 1

## 2020-04-25 MED ORDER — LACTATED RINGERS IV SOLN: INTRAVENOUS | Status: DC

## 2020-04-25 MED ORDER — MIDAZOLAM HCL 5 MG/5ML IJ SOLN
INTRAMUSCULAR | Status: DC | PRN
  Administered 2020-04-25: 2 mg via INTRAVENOUS

## 2020-04-25 SURGICAL SUPPLY — 92 items
BANDAGE ESMARK 6X9 LF (GAUZE/BANDAGES/DRESSINGS) IMPLANT
BLADE AVERAGE 25X9 (BLADE) ×1 IMPLANT
BLADE SURG 10 STRL SS (BLADE) ×3 IMPLANT
BNDG CMPR 9X4 STRL LF SNTH (GAUZE/BANDAGES/DRESSINGS)
BNDG CMPR 9X6 STRL LF SNTH (GAUZE/BANDAGES/DRESSINGS)
BNDG COHESIVE 4X5 TAN STRL (GAUZE/BANDAGES/DRESSINGS) ×3 IMPLANT
BNDG ELASTIC 4X5.8 VLCR STR LF (GAUZE/BANDAGES/DRESSINGS) ×3 IMPLANT
BNDG ELASTIC 6X5.8 VLCR STR LF (GAUZE/BANDAGES/DRESSINGS) ×3 IMPLANT
BNDG ESMARK 4X9 LF (GAUZE/BANDAGES/DRESSINGS) IMPLANT
BNDG ESMARK 6X9 LF (GAUZE/BANDAGES/DRESSINGS)
BNDG GAUZE ELAST 4 BULKY (GAUZE/BANDAGES/DRESSINGS) ×3 IMPLANT
BRUSH SCRUB EZ PLAIN DRY (MISCELLANEOUS) ×6 IMPLANT
CNTNR URN SCR LID CUP LEK RST (MISCELLANEOUS) IMPLANT
CONT SPEC 4OZ STRL OR WHT (MISCELLANEOUS)
COVER SURGICAL LIGHT HANDLE (MISCELLANEOUS) ×6 IMPLANT
COVER WAND RF STERILE (DRAPES) ×3 IMPLANT
CUFF TOURN SGL LL 12 NO SLV (MISCELLANEOUS) IMPLANT
CUFF TOURN SGL QUICK 34 (TOURNIQUET CUFF)
CUFF TRNQT CYL 34X4.125X (TOURNIQUET CUFF) IMPLANT
DRAPE C-ARM 42X72 X-RAY (DRAPES) ×3 IMPLANT
DRAPE IMP U-DRAPE 54X76 (DRAPES) ×3 IMPLANT
DRAPE INCISE IOBAN 66X45 STRL (DRAPES) ×3 IMPLANT
DRAPE ORTHO SPLIT 77X108 STRL (DRAPES) ×3
DRAPE SURG 17X23 STRL (DRAPES) IMPLANT
DRAPE SURG ORHT 6 SPLT 77X108 (DRAPES) ×2 IMPLANT
DRAPE U-SHAPE 47X51 STRL (DRAPES) ×6 IMPLANT
DRSG EMULSION OIL 3X3 NADH (GAUZE/BANDAGES/DRESSINGS) ×5 IMPLANT
DRSG PAD ABDOMINAL 8X10 ST (GAUZE/BANDAGES/DRESSINGS) ×5 IMPLANT
DURAPREP 26ML APPLICATOR (WOUND CARE) ×3 IMPLANT
ELECT CAUTERY BLADE 6.4 (BLADE) IMPLANT
ELECT NDL TIP 2.8 STRL (NEEDLE) ×2 IMPLANT
ELECT NEEDLE TIP 2.8 STRL (NEEDLE) ×3 IMPLANT
ELECT REM PT RETURN 9FT ADLT (ELECTROSURGICAL) ×3
ELECTRODE REM PT RTRN 9FT ADLT (ELECTROSURGICAL) ×2 IMPLANT
EVACUATOR 1/8 PVC DRAIN (DRAIN) IMPLANT
FACESHIELD WRAPAROUND (MASK) ×3 IMPLANT
FACESHIELD WRAPAROUND OR TEAM (MASK) ×2 IMPLANT
GAUZE SPONGE 4X4 12PLY STRL (GAUZE/BANDAGES/DRESSINGS) ×3 IMPLANT
GAUZE XEROFORM 1X8 LF (GAUZE/BANDAGES/DRESSINGS) ×3 IMPLANT
GLOVE BIO SURGEON STRL SZ7.5 (GLOVE) ×6 IMPLANT
GLOVE BIO SURGEON STRL SZ8 (GLOVE) ×3 IMPLANT
GLOVE BIOGEL PI IND STRL 7.5 (GLOVE) ×2 IMPLANT
GLOVE BIOGEL PI IND STRL 8 (GLOVE) ×4 IMPLANT
GLOVE BIOGEL PI INDICATOR 7.5 (GLOVE) ×1
GLOVE BIOGEL PI INDICATOR 8 (GLOVE) ×2
GOWN STRL REUS W/ TWL LRG LVL3 (GOWN DISPOSABLE) ×6 IMPLANT
GOWN STRL REUS W/ TWL XL LVL3 (GOWN DISPOSABLE) ×2 IMPLANT
GOWN STRL REUS W/TWL LRG LVL3 (GOWN DISPOSABLE) ×9
GOWN STRL REUS W/TWL XL LVL3 (GOWN DISPOSABLE) ×3
HANDPIECE INTERPULSE COAX TIP (DISPOSABLE)
KIT BASIN OR (CUSTOM PROCEDURE TRAY) ×3 IMPLANT
KIT TURNOVER KIT B (KITS) ×3 IMPLANT
MANIFOLD NEPTUNE II (INSTRUMENTS) ×3 IMPLANT
NDL HYPO 25GX1X1/2 BEV (NEEDLE) ×2 IMPLANT
NDL SUT 2 .5 CRC MAYO 1.732X (NEEDLE) IMPLANT
NEEDLE HYPO 25GX1X1/2 BEV (NEEDLE) ×3 IMPLANT
NEEDLE MAYO TAPER (NEEDLE)
NS IRRIG 1000ML POUR BTL (IV SOLUTION) ×3 IMPLANT
PACK ORTHO EXTREMITY (CUSTOM PROCEDURE TRAY) ×3 IMPLANT
PACK SHOULDER (CUSTOM PROCEDURE TRAY) ×3 IMPLANT
PACK TOTAL JOINT (CUSTOM PROCEDURE TRAY) ×3 IMPLANT
PACK UNIVERSAL I (CUSTOM PROCEDURE TRAY) ×3 IMPLANT
PAD ARMBOARD 7.5X6 YLW CONV (MISCELLANEOUS) ×6 IMPLANT
SET CYSTO W/LG BORE CLAMP LF (SET/KITS/TRAYS/PACK) IMPLANT
SET HNDPC FAN SPRY TIP SCT (DISPOSABLE) IMPLANT
SPONGE LAP 18X18 RF (DISPOSABLE) ×6 IMPLANT
STAPLER VISISTAT 35W (STAPLE) IMPLANT
STOCKINETTE IMPERVIOUS 9X36 MD (GAUZE/BANDAGES/DRESSINGS) ×3 IMPLANT
STRIP CLOSURE SKIN 1/2X4 (GAUZE/BANDAGES/DRESSINGS) ×3 IMPLANT
SUCTION FRAZIER HANDLE 10FR (MISCELLANEOUS) ×3
SUCTION TUBE FRAZIER 10FR DISP (MISCELLANEOUS) ×2 IMPLANT
SUT ETHIBOND 2 OS 4 DA (SUTURE) IMPLANT
SUT ETHIBOND NAB BRD #0 18IN (SUTURE) IMPLANT
SUT ETHILON 3 0 PS 1 (SUTURE) ×3 IMPLANT
SUT ETHILON 4 0 PS 2 18 (SUTURE) IMPLANT
SUT MNCRL AB 4-0 PS2 18 (SUTURE) ×3 IMPLANT
SUT MON AB 4-0 PC3 18 (SUTURE) ×3 IMPLANT
SUT PDS AB 2-0 CT1 27 (SUTURE) IMPLANT
SUT VIC AB 0 CT1 27 (SUTURE) ×3
SUT VIC AB 0 CT1 27XBRD ANBCTR (SUTURE) ×2 IMPLANT
SUT VIC AB 0 CT2 27 (SUTURE) IMPLANT
SUT VIC AB 2-0 CT1 27 (SUTURE) ×3
SUT VIC AB 2-0 CT1 TAPERPNT 27 (SUTURE) ×2 IMPLANT
SUT VIC AB 2-0 FS1 27 (SUTURE) IMPLANT
SWAB CULTURE ESWAB REG 1ML (MISCELLANEOUS) IMPLANT
SYR CONTROL 10ML LL (SYRINGE) ×3 IMPLANT
TOWEL GREEN STERILE (TOWEL DISPOSABLE) ×3 IMPLANT
TOWEL GREEN STERILE FF (TOWEL DISPOSABLE) ×3 IMPLANT
TUBE CONNECTING 12X1/4 (SUCTIONS) ×3 IMPLANT
UNDERPAD 30X36 HEAVY ABSORB (UNDERPADS AND DIAPERS) ×3 IMPLANT
WATER STERILE IRR 1000ML POUR (IV SOLUTION) ×3 IMPLANT
YANKAUER SUCT BULB TIP NO VENT (SUCTIONS) ×3 IMPLANT

## 2020-04-25 NOTE — Progress Notes (Signed)
Pt arrived on floor, transferred from SICU. Patient is A&OX4 and in no acute distress. Pt reports abdominal pain 10/10 that radiated to her lower abdominal area, so Oxycodone PO given with small sip of water. Mid back with honeycomb dressing with some draining. Cabbell MD is aware. R shoulder also with honeycomb dressing and minimal drainage. Pt placed on tele.   Patient oriented to room and unit. Bedside table and personal belongings within reach of patient. Patient educated on usage of nurse call bell, placed within reach of patient. Patient instructed to call for assistance. Patient in bed. Will continue to monitor.

## 2020-04-25 NOTE — Progress Notes (Addendum)
   04/25/20 1831  Assess: MEWS Score  Temp 98.1 F (36.7 C)  BP (!) 184/98  Pulse Rate 78  ECG Heart Rate 81  Resp (!) 22  Level of Consciousness Alert  SpO2 97 %  O2 Device Room Air   Pt arrived from PACU with elevated BP. PACU RN stated this is normal for this patient as it has been high while pt was in PACU. Pt reports pain level of 9/10 to back area radiates "some" to her abdominal area. She has received Dilaudid in PACU but it has not been effective in managing her pain. Attempting to page NRS for elevated BP r/t ineffective pain control.   @1855  Attempted to call NRS x3 however no response and unable to leave a message. Will inform night RN of my efforts and attempt to page night MD instead.

## 2020-04-25 NOTE — Anesthesia Procedure Notes (Signed)
Procedure Name: Intubation Date/Time: 04/25/2020 4:15 PM Performed by: Eligha Bridegroom, CRNA Pre-anesthesia Checklist: Patient identified, Emergency Drugs available, Suction available, Patient being monitored and Timeout performed Patient Re-evaluated:Patient Re-evaluated prior to induction Oxygen Delivery Method: Circle system utilized Preoxygenation: Pre-oxygenation with 100% oxygen Induction Type: IV induction Ventilation: Mask ventilation without difficulty Laryngoscope Size: Mac and 3 Grade View: Grade I Tube type: Oral Tube size: 7.0 mm Number of attempts: 1 Airway Equipment and Method: Stylet Placement Confirmation: positive ETCO2 and breath sounds checked- equal and bilateral Secured at: 21 cm Tube secured with: Tape Dental Injury: Teeth and Oropharynx as per pre-operative assessment

## 2020-04-25 NOTE — Progress Notes (Signed)
PT Cancellation Note  Patient Details Name: Debra Schmidt MRN: [id number] DOB: October 04, 1961   Cancelled Treatment:    Reason Eval/Treat Not Completed: Patient at procedure or test/unavailable.  Pt headed to OR for R shoulder I and D.  RN giving report and transporters are here.  PT will follow up tomorrow.  Thanks,  Corinna Capra, PT, DPT  Acute Rehabilitation [phone] pager #[phone] office       [name] 04/25/2020, 12:44 PM

## 2020-04-25 NOTE — Progress Notes (Signed)
PROGRESS NOTE    Debra Schmidt  WUJ:[id number] DOB: 1961/05/02 DOA: 04/23/2020 PCP: Patient, No Pcp Per  Brief Narrative: Debra Schmidt is a 59 y.o. female  with remote history of Xanax and hydrocodone use presented to the ED 6/13 , she states that she fell in the bathtub several weeks ago injuring her right shoulder, eventually developed a draining wound on the top of her shoulder.  She began to have progressively more severe back pain left urinary incontinence leading to admission 6/13.  MRI showed L1-2 discitis with thoracolumbar abscess.  She underwent emergent surgery by Dr. 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 -Continue IV vancomycin, infectious disease following -Antibiotics per ID recommendations -Underwent L1/2 laminectomy, epidural abscess evacuation on 6/13 -Follow-up OR cultures, anticipate MSSA -Check repeat blood cultures -Orthopedics planning I&D and distal clavicle resection today -PT OT  Chronic hypokalemia -Etiology not clear -Mag level okay -Replace aggressively today  Acute on chronic anemia -The setting of severe infections and surgery -Monitor    DVT prophylaxis: SCDs, likely following lumbar spine surgery, defer to neurosurgery Code Status: Full code Family Communication: No family at bedside [facility]position Plan: [facility]     Procedures: 6/13 PRE-OPERATIVE DIAGNOSIS:  Epidural Mass  POST-OPERATIVE DIAGNOSIS:  Epidural Abscess, L1/2 discitis  PROCEDURE:  Procedure(s): Lumbar One-Two LAMINECTOMY for Epidural Abscess Evacuation  SURGEON: Surgeon(s):  Coletta Memos, MD  Antimicrobials:    Subjective: -Feels okay, in good spirits overall, awaiting shoulder surgery  Objective: Vitals:   04/25/20 0700  04/25/20 0800 04/25/20 0900 04/25/20 1000  BP: (!) 127/112 103/89 (!) 138/102 (!) 162/139  Pulse: 84 87 86 91  Resp: (!) 28 (!) 30 (!) 31 20  Temp:  98.5 F (36.9 C)    TempSrc:  Oral    SpO2: 97% 97% 95% 99%  Weight:      Height:        Intake/Output Summary (Last 24 hours) at 04/25/2020 1045 Last data filed at 04/25/2020 1034 Gross per 24 hour  Intake 2380.3 ml  Output 3425 ml  Net -1044.7 ml   Filed Weights   04/23/20 1332  Weight: 62.6 kg    Examination:  General exam: Pleasant female, laying in bed, AAOx3 Respiratory system: Decreased breath sounds at both bases, otherwise clear Cardiovascular system: S1 & S2 heard, RRR Gastrointestinal system: Abdomen is nondistended, soft and nontender.Normal bowel sounds heard. Central nervous system: Awake alert, oriented x2, moves all extremities no localizing sign Extremities: Swelling and tenderness, right shoulder  skin: As above Psychiatry:  Mood & affect appropriate.     Data Reviewed:   CBC: Recent Labs  Lab 04/23/20 1339 04/23/20 2056 04/23/20 2356 04/24/20 0605 04/25/20 0754  WBC 9.1  --   --  11.3* 14.2*  HGB 10.4* 10.2* 8.5* 8.7* 8.9*  HCT 32.2* 30.0* 25.0* 27.3* 27.6*  MCV 84.7  --   --  84.8 84.7  PLT 407*  --   --  366 347   Basic Metabolic Panel: Recent Labs  Lab 04/23/20 1339 04/23/20 1339 04/23/20 2054 04/23/20 2056 04/23/20 2356 04/24/20 0605 04/24/20 1039 04/25/20 0528 04/25/20 0754  NA 140   < > 140 143 143 140  --   --  140  K 2.0*   < > 2.0* 2.0* 2.1* 2.2*  --   --  2.4*  CL 97*  --  99 99  --  101  --   --  101  CO2 27  --  28  --   --  29  --   --  28  GLUCOSE 143*  --  161* 157*  --  120*  --   --  119*  BUN 5*  --  <5* 3*  --  <5*  --   --  <5*  CREATININE 0.79  --  0.78 0.60  --  0.69  --   --  0.55  CALCIUM 9.4  --  9.4  --   --  8.8*  --   --  8.5*  MG 1.6*  --   --   --   --  1.8  --   --   --   PHOS  --   --   --   --   --   --  3.8 2.3*  --    < > = values in this  interval not displayed.   GFR: Estimated Creatinine Clearance: 65.4 mL/min (by C-G formula based on SCr of 0.55 mg/dL). Liver Function Tests: Recent Labs  Lab 04/23/20 1339 04/24/20 0605  AST 19 18  ALT 13 11  ALKPHOS 74 64  BILITOT 0.6 0.7  PROT 7.7 6.4*  ALBUMIN 2.4* 1.9*   Recent Labs  Lab 04/23/20 1339  LIPASE 25   No results for input(s): AMMONIA in the last 168 hours. Coagulation Profile: No results for input(s): INR, PROTIME in the last 168 hours. Cardiac Enzymes: No results for input(s): CKTOTAL, CKMB, CKMBINDEX, TROPONINI in the last 168 hours. BNP (last 3 results) No results for input(s): PROBNP in the last 8760 hours. HbA1C: Recent Labs    04/24/20 0605  HGBA1C 6.7*   CBG: Recent Labs  Lab 04/24/20 0550  GLUCAP 109*   Lipid Profile: No results for input(s): CHOL, HDL, LDLCALC, TRIG, CHOLHDL, LDLDIRECT in the last 72 hours. Thyroid Function Tests: No results for input(s): TSH, T4TOTAL, FREET4, T3FREE, THYROIDAB in the last 72 hours. Anemia Panel: No results for input(s): VITAMINB12, FOLATE, FERRITIN, TIBC, IRON, RETICCTPCT in the last 72 hours. Urine analysis:    Component Value Date/Time   COLORURINE YELLOW 04/23/2020 1553   APPEARANCEUR CLEAR 04/23/2020 1553   LABSPEC 1.009 04/23/2020 1553   PHURINE 6.0 04/23/2020 1553   GLUCOSEU NEGATIVE 04/23/2020 1553   HGBUR NEGATIVE 04/23/2020 1553   BILIRUBINUR NEGATIVE 04/23/2020 1553   KETONESUR NEGATIVE 04/23/2020 1553   PROTEINUR NEGATIVE 04/23/2020 1553   NITRITE NEGATIVE 04/23/2020 1553   LEUKOCYTESUR NEGATIVE 04/23/2020 1553   Sepsis Labs: @LABRCNTIP (procalcitonin:4,lacticidven:4)  ) Recent Results (from the past 240 hour(s))  SARS Coronavirus 2 by RT PCR (hospital order, performed in [facility] hospital lab) Nasopharyngeal Nasopharyngeal Swab     Status: None   Collection Time: 04/23/20  6:51 PM   Specimen: Nasopharyngeal Swab  Result Value Ref Range Status   SARS Coronavirus 2 NEGATIVE  NEGATIVE Final    Comment: (NOTE) SARS-CoV-2 target nucleic acids are NOT DETECTED.  The SARS-CoV-2 RNA is generally detectable in upper and lower respiratory specimens during the acute phase of infection. The lowest concentration of SARS-CoV-2 viral copies this assay can detect is 250 copies / mL. A negative result does not preclude SARS-CoV-2 infection and should not be used as the sole basis for treatment or other patient management decisions.  A negative result may occur with improper specimen collection / handling, submission of specimen  other than nasopharyngeal swab, presence of viral mutation(s) within the areas targeted by this assay, and inadequate number of viral copies (<250 copies / mL). A negative result must be combined with clinical observations, patient history, and epidemiological information.  Fact Sheet for Patients:   BoilerBrush.com.cy  Fact Sheet for Healthcare Providers: https://pope.com/  This test is not yet approved or  cleared by the [place] FDA and has been authorized for detection and/or diagnosis of SARS-CoV-2 by FDA under an Emergency Use Authorization (EUA).  This EUA will remain in effect (meaning this test can be used) for the duration of the COVID-19 declaration under Section 564(b)(1) of the Act, 21 U.S.C. section 360bbb-3(b)(1), unless the authorization is terminated or revoked sooner.  Performed at [facility], [address] [place], [place] [address]   Blood culture (routine x 2)     Status: Abnormal (Preliminary result)   Collection Time: 04/23/20  7:30 PM   Specimen: BLOOD  Result Value Ref Range Status   Specimen Description BLOOD RIGHT ANTECUBITAL  Final   Special Requests   Final    BOTTLES DRAWN AEROBIC AND ANAEROBIC Blood Culture adequate volume   Culture  Setup Time   Final    AEROBIC BOTTLE ONLY GRAM POSITIVE COCCI IN CLUSTERS Organism ID to follow CRITICAL RESULT  CALLED TO, READ BACK BY AND VERIFIED WITH: Tori Milks [facility] 04/24/20 2317 JDW Performed at [facility], [address] [place], [place] [address]    Culture STAPHYLOCOCCUS AUREUS (A)  Final   Report Status PENDING  Incomplete  Blood Culture ID Panel (Reflexed)     Status: Abnormal   Collection Time: 04/23/20  7:30 PM  Result Value Ref Range Status   Enterococcus species NOT DETECTED NOT DETECTED Final   Listeria monocytogenes NOT DETECTED NOT DETECTED Final   Staphylococcus species DETECTED (A) NOT DETECTED Final    Comment: CRITICAL RESULT CALLED TO, READ BACK BY AND VERIFIED WITH: L SEAY PHARMD 04/24/20 2317 JDW    Staphylococcus aureus (BCID) DETECTED (A) NOT DETECTED Final    Comment: Methicillin (oxacillin) susceptible Staphylococcus aureus (MSSA). Preferred therapy is anti staphylococcal beta lactam antibiotic (Cefazolin or Nafcillin), unless clinically contraindicated. CRITICAL RESULT CALLED TO, READ BACK BY AND VERIFIED WITH: L SEAY PHARMD 04/24/20 2317 JDW    Methicillin resistance NOT DETECTED NOT DETECTED Final   Streptococcus species NOT DETECTED NOT DETECTED Final   Streptococcus agalactiae NOT DETECTED NOT DETECTED Final   Streptococcus pneumoniae NOT DETECTED NOT DETECTED Final   Streptococcus pyogenes NOT DETECTED NOT DETECTED Final   Acinetobacter baumannii NOT DETECTED NOT DETECTED Final   Enterobacteriaceae species NOT DETECTED NOT DETECTED Final   Enterobacter cloacae complex NOT DETECTED NOT DETECTED Final   Escherichia coli NOT DETECTED NOT DETECTED Final   Klebsiella oxytoca NOT DETECTED NOT DETECTED Final   Klebsiella pneumoniae NOT DETECTED NOT DETECTED Final   Proteus species NOT DETECTED NOT DETECTED Final   Serratia marcescens NOT DETECTED NOT DETECTED Final   Haemophilus influenzae NOT DETECTED NOT DETECTED Final   Neisseria meningitidis NOT DETECTED NOT DETECTED Final   Pseudomonas aeruginosa NOT DETECTED NOT DETECTED Final   Candida albicans NOT  DETECTED NOT DETECTED Final   Candida glabrata NOT DETECTED NOT DETECTED Final   Candida krusei NOT DETECTED NOT DETECTED Final   Candida parapsilosis NOT DETECTED NOT DETECTED Final   Candida tropicalis NOT DETECTED NOT DETECTED Final    Comment: Performed at [facility], [address]  [place], [place] [address]  Blood culture (routine x 2)     Status: None (Preliminary result)   Collection Time: 04/23/20  7:35 PM   Specimen: BLOOD RIGHT FOREARM  Result Value Ref Range Status   Specimen Description BLOOD RIGHT FOREARM  Final   Special Requests   Final    BOTTLES DRAWN AEROBIC AND ANAEROBIC Blood Culture results may not be optimal due to an inadequate volume of blood received in culture bottles   Culture   Final    NO GROWTH < 24 HOURS Performed at [facility], [address], [place], [place] [address]    Report Status PENDING  Incomplete  Aerobic/Anaerobic Culture (surgical/deep wound)     Status: None (Preliminary result)   Collection Time: 04/23/20 10:37 PM   Specimen: Abscess  Result Value Ref Range Status   Specimen Description ABSCESS  Final   Special Requests THORACOULMBAR  Final   Gram Stain   Final    FEW WBC PRESENT, PREDOMINANTLY PMN FEW GRAM POSITIVE COCCI CRITICAL RESULT CALLED TO, READ BACK BY AND VERIFIED WITH: RN T ROWE @0107  04/24/20 BY S GEZAHEGN Performed at [facility], [address] [place], [place]    Culture FEW STAPHYLOCOCCUS AUREUS  Final   Report Status PENDING  Incomplete  Aerobic/Anaerobic Culture (surgical/deep wound)     Status: None (Preliminary result)   Collection Time: 04/23/20 10:37 PM   Specimen: Soft Tissue, Other  Result Value Ref Range Status   Specimen Description TISSUE  Final   Special Requests EPIDURAL  Final   Gram Stain   Final    NO WBC SEEN NO ORGANISMS SEEN Performed at [facility], [address], [place], [place]    Culture FEW STAPHYLOCOCCUS AUREUS  Final   Report Status  PENDING  Incomplete  MRSA PCR Screening     Status: None   Collection Time: 04/24/20  1:50 AM   Specimen: Nasal Mucosa; Nasopharyngeal  Result Value Ref Range Status   MRSA by PCR NEGATIVE NEGATIVE Final    Comment:        The GeneXpert MRSA Assay (FDA approved for NASAL specimens only), is one component of a comprehensive MRSA colonization surveillance program. It is not intended to diagnose MRSA infection nor to guide or monitor treatment for MRSA infections. Performed at [facility], [address] [place], [place]          Radiology Studies: DG Thoracolumabar Spine  Result Date: 04/24/2020 CLINICAL DATA:  L1-2 laminectomy EXAM: THORACOLUMBAR SPINE 1V COMPARISON:  Intraoperative imaging earlier FINDINGS: Single cross-table lateral view of the lumbar spine demonstrates posterior surgical instruments directed at the L1-2 level. IMPRESSION: Intraoperative localization as above. Electronically Signed   By: 04/26/2020 M.D.   On: 04/24/2020 01:11   DG Thoracolumabar Spine  Result Date: 04/23/2020 CLINICAL DATA:  Elective surgery EXAM: THORACOLUMBAR SPINE 1V COMPARISON:  None. FINDINGS: Cross-table lateral views of the lumbar spine are submitted. Final image demonstrates posterior surgical instruments directed at the T12-L1 level. IMPRESSION: Intraoperative localization as above. Electronically Signed   By: 04/25/2020 M.D.   On: 04/23/2020 23:30   MR THORACIC SPINE WO CONTRAST  Addendum Date: 04/23/2020   ADDENDUM REPORT: 04/23/2020 18:36 ADDENDUM: Critical findings discussed by telephone with Dr. 04/25/2020 on 04/23/2020 at 18:26. Electronically Signed   By: 04/25/2020 M.D.   On: 04/23/2020 18:36   Result Date: 04/23/2020 CLINICAL DATA:  59 year old female  with low back pain, urinary incontinence. EXAM: MRI THORACIC SPINE WITHOUT CONTRAST TECHNIQUE: Multiplanar, multisequence MR imaging of the thoracic spine was performed. No intravenous contrast was  administered. COMPARISON:  Lumbar MRI today reported separately. CT Abdomen and Pelvis 02/24/2020. FINDINGS: Limited cervical spine imaging:  Negative. Thoracic spine segmentation: Appears to be normal, and concordant with the lumbar numbering today. Alignment: Mildly exaggerated thoracic kyphosis. No spondylolisthesis. Vertebrae: No acute osseous abnormality identified in the thoracic spine, thoracic marrow signal appears to remain normal. Abnormal L1 and L2 levels, detailed separately today. Cord:  Axial images degraded by motion. Abnormal thoracic spinal cord T2 and STIR hyperintensity within the central cord beginning at the T8 vertebral level (series 5, image 12, series 8, image 31) and continuing to the cauda equina. Mildly abnormal central cord signal may extend as high as the T4 level, but above that the cord appears within normal limits. Superimposed bulky abnormal intraspinal T2 and STIR hyperintense signal in the lower thoracic spinal canal beginning at T10-T11 (series 6, image 10) and continuing into the lumbar spinal canal. Axial images through the segments are degraded by motion but the cord appears primarily displaced and compressed posteriorly by the bulky fluid signal space-occupying mass (series 12, image 29). The dorsal thoracic epidural space is probably abnormal as high as the T9 level. Paraspinal and other soft tissues: The lower thoracic paraspinal soft tissues become abnormal at the T11-T12 level, with STIR hyperintensity compatible with inflammation. Abnormality continues into the lumbar spine reported separately. Grossly negative visible thoracic and upper abdominal viscera. Disc levels: Mild thoracic spine degeneration with the exception of T8-T9 where disc desiccation and disc space loss are accompanied by disc bulging. No spinal or foraminal stenosis associated. IMPRESSION: 1. Positive for compression of the lower thoracic spinal cord and conus by what appears to be a bulky  multiloculated intraspinal fluid collection emanating from the abnormal lumbar L1-L2 level (detailed separately). 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 2. Severe abnormal signal in the spinal cord - especially from T8 inferiorly - could be cord edema from compression, but consider also Venous Cord Infarction from septic spinal venous thrombophlebitis. 3. Recommend Neurosurgery, Neurology, and Infectious Disease consultation. Electronically Signed: By: Genevie Ann M.D. On: 04/23/2020 18:20   MR LUMBAR SPINE WO CONTRAST  Result Date: 04/23/2020 CLINICAL DATA:  59 year old female with low back pain, urinary incontinence. EXAM: MRI LUMBAR SPINE WITHOUT CONTRAST TECHNIQUE: Multiplanar, multisequence MR imaging of the lumbar spine was performed. No intravenous contrast was administered. COMPARISON:  CT Abdomen and Pelvis 03/25/2020. Thoracic spine MRI today reported separately. FINDINGS: Segmentation: Normal, concordant with the thoracic spine numbering today. Alignment: Stable lumbar lordosis, subtle new retrolisthesis of L2 on L3. Vertebrae: Confluent vertebral body edema at L2 and L3 with intervening abnormal disc space with fluid and endplate erosions. The posterior elements of these levels appear relatively spared although there is abnormal facet joint fluid bilaterally, which is not seen at the adjacent spinal levels. No other marrow edema or evidence of acute osseous abnormality. Intact visible sacrum and SI joints. Conus medullaris and cauda equina: Highly abnormal as described on the thoracic MRI today. Axial images on this exam are also degraded by motion, but the abnormal cord can be seen displaced dorsally beginning at the T11 level (series 3, image 7). The conus medullaris is difficult to delineate. And there is ventral (lower thoracic) and complete (L1 and lower) filling of the spinal canal by multiloculated appearing T2 and STIR hyperintense material which is  likely  abscess. Severe spinal stenosis is present from L1 through L4. Possibly more normal appearance of the thecal sac beginning at L5, although the cauda equina nerve roots there are difficult to delineate. Paraspinal and other soft tissues: Positive for lateral paraspinal abscesses beginning at the T12 level and maximal at L1-L2-greater on the right (series 3, image 3). The individual paraspinal abscesses are up to 24 mm. Associated edema in the bilateral upper lumbar psoas muscles, which become more normal by the L4 level. Grossly negative visible abdominal viscera. Posteriorly there is bilateral erector spinae muscle edema, although no posterior paraspinal abscess. Disc levels: Outside of L1-L2, the intervertebral discs appear normal except for chronic degeneration at L3-L4 and L4-L5. There is superimposed degenerative bilateral lumbar facet hypertrophy, maximal at L4-L5 where facet joint fluid is also noted. IMPRESSION: 1. 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 Septic arthritis of the L1-L2 facets is also possible. 2. 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 3. Superimposed lateral paraspinal soft tissue abscesses, greater on the right maximal at L1-L2 and individually up to 2.4 cm. Salient findings discussed by telephone with Dr. Gwyneth Sprout in the ED on 04/23/2020 at 18:28 . Electronically Signed   By: Odessa Fleming M.D.   On: 04/23/2020 18:34   CT SHOULDER RIGHT W CONTRAST  Result Date: 04/24/2020 CLINICAL DATA:  Fall several weeks ago with right shoulder wound now with surrounding redness and purulent drainage. Admitted with L1-L2 osteomyelitis-discitis and large epidural abscess. EXAM: CT OF THE UPPER RIGHT EXTREMITY WITH CONTRAST TECHNIQUE: Multidetector CT imaging of the right shoulder was performed according to the  standard protocol following intravenous contrast administration. COMPARISON:  None. CONTRAST:  75mL OMNIPAQUE IOHEXOL 300 MG/ML  SOLN FINDINGS: Bones/Joint/Cartilage Erosive changes of the distal clavicle and acromion. Acromioclavicular joint effusion with distention of the joint capsule. No fracture or dislocation. No glenohumeral joint effusion. Small amount of fluid in the subacromial/subdeltoid bursa. Ligaments Ligaments are suboptimally evaluated by CT. Muscles and Tendons Grossly intact.  No muscle atrophy. Soft tissue Soft tissue ulceration on the top of the shoulder with underlying inflammatory changes extending to the acromioclavicular joint. No discrete fluid collection. Small reactive right axillary lymph nodes. IMPRESSION: 1. Soft tissue ulceration on the top of the shoulder with underlying acromioclavicular septic arthritis and osteomyelitis of the acromion and distal clavicle. 2. Small amount of fluid in the subacromial/subdeltoid bursa. Septic bursitis is not excluded. 3. No glenohumeral erosive changes or joint effusion. Electronically Signed   By: Obie Dredge M.D.   On: 04/24/2020 10:15   ECHOCARDIOGRAM COMPLETE  Result Date: 04/24/2020    ECHOCARDIOGRAM REPORT   Patient Name:   SHAKYIA BOSSO Date of Exam: 04/24/2020 Medical Rec #:  [id number]      Height:       64.0 in Accession #:    [id number]     Weight:       138.0 lb Date of Birth:  13-May-1961     BSA:          1.671 m Patient Age:    58 years       BP:           177/93 mmHg Patient Gender: F              HR:           87  bpm. Exam Location:  Inpatient Procedure: 2D Echo, Color Doppler and Cardiac Doppler Indications:    Endocarditis  History:        Patient has no prior history of Echocardiogram examinations.  Sonographer:    Irving BurtonEmily Senior RDCS Referring Phys: [id number] MURALI RAMASWAMY IMPRESSIONS  1. Left ventricular ejection fraction, by estimation, is 65 to 70%. The left ventricle has normal function. The left ventricle has no regional  wall motion abnormalities. Left ventricular diastolic parameters were normal.  2. Right ventricular systolic function is normal. The right ventricular size is normal. Tricuspid regurgitation signal is inadequate for assessing PA pressure.  3. The mitral valve is normal in structure. No evidence of mitral valve regurgitation.  4. The aortic valve is tricuspid. Aortic valve regurgitation is not visualized. No aortic stenosis is present.  5. The inferior vena cava is normal in size with <50% respiratory variability, suggesting right atrial pressure of 8 mmHg.  6. No vegetation seen. If high clinical suspicion for endocarditis, consider TEE FINDINGS  Left Ventricle: Left ventricular ejection fraction, by estimation, is 65 to 70%. The left ventricle has normal function. The left ventricle has no regional wall motion abnormalities. The left ventricular internal cavity size was normal in size. There is  no left ventricular hypertrophy. Left ventricular diastolic parameters were normal. Right Ventricle: The right ventricular size is normal. No increase in right ventricular wall thickness. Right ventricular systolic function is normal. Tricuspid regurgitation signal is inadequate for assessing PA pressure. Left Atrium: Left atrial size was normal in size. Right Atrium: Right atrial size was normal in size. Pericardium: There is no evidence of pericardial effusion. Mitral Valve: The mitral valve is normal in structure. No evidence of mitral valve regurgitation. Tricuspid Valve: The tricuspid valve is normal in structure. Tricuspid valve regurgitation is not demonstrated. Aortic Valve: The aortic valve is tricuspid. Aortic valve regurgitation is not visualized. No aortic stenosis is present. Pulmonic Valve: The pulmonic valve was not well visualized. Pulmonic valve regurgitation is not visualized. Aorta: The aortic root and ascending aorta are structurally normal, with no evidence of dilitation. Venous: The inferior vena cava  is normal in size with less than 50% respiratory variability, suggesting right atrial pressure of 8 mmHg. IAS/Shunts: The interatrial septum was not well visualized.  LEFT VENTRICLE PLAX 2D LVIDd:         4.40 cm  Diastology LVIDs:         2.45 cm  LV e' lateral:   10.90 cm/s LV PW:         0.90 cm  LV E/e' lateral: 11.6 LV IVS:        0.90 cm  LV e' medial:    10.60 cm/s LVOT diam:     1.90 cm  LV E/e' medial:  11.9 LV SV:         62 LV SV Index:   37 LVOT Area:     2.84 cm  RIGHT VENTRICLE RV S prime:     10.90 cm/s TAPSE (M-mode): 2.4 cm LEFT ATRIUM             Index       RIGHT ATRIUM           Index LA diam:        3.20 cm 1.92 cm/m  RA Area:     10.70 cm LA Vol (A2C):   35.5 ml 21.25 ml/m RA Volume:   19.90 ml  11.91 ml/m LA Vol (A4C):   49.5 ml  29.62 ml/m LA Biplane Vol: 42.6 ml 25.49 ml/m  AORTIC VALVE LVOT Vmax:   120.00 cm/s LVOT Vmean:  70.000 cm/s LVOT VTI:    0.220 m  AORTA Ao Root diam: 3.10 cm Ao Asc diam:  3.20 cm MITRAL VALVE MV Area (PHT): 3.99 cm     SHUNTS MV Decel Time: 190 msec     Systemic VTI:  0.22 m MV E velocity: 126.00 cm/s  Systemic Diam: 1.90 cm MV A velocity: 102.00 cm/s MV E/A ratio:  1.24 Epifanio Lesches MD Electronically signed by Epifanio Lesches MD Signature Date/Time: 04/24/2020/6:44:41 PM    Final         Scheduled Meds: . amLODipine  5 mg Oral Daily  . chlorhexidine  60 mL Topical Once  . Chlorhexidine Gluconate Cloth  6 each Topical Daily  . docusate sodium  100 mg Oral BID  . feeding supplement  296 mL Oral Once  . oxyCODONE  15 mg Oral Q12H  . pantoprazole (PROTONIX) IV  40 mg Intravenous QHS  . potassium chloride  40 mEq Oral Q2H  . povidone-iodine  2 application Topical Once  . sodium chloride flush  10-40 mL Intracatheter Q12H  . sodium chloride flush  3 mL Intravenous Q12H   Continuous Infusions: . sodium chloride    . 0.9 % NaCl with KCl 20 mEq / L 80 mL/hr at 04/25/20 1000  .  ceFAZolin (ANCEF) IV       LOS: 2 days    Time  spent:   Zannie Cove, MD [facility]  04/25/2020, 10:45 AM

## 2020-04-25 NOTE — Transfer of Care (Signed)
Immediate Anesthesia Transfer of Care Note  Patient: Debra Schmidt  Procedure(s) Performed: Open debridement R AC joint; Distal clavicle repair (Right ) ACROMIO-CLAVICULAR JOINT REPAIR (Right Shoulder) RESECTION DISTAL CLAVICAL (Right )  Patient Location: PACU  Anesthesia Type:General  Level of Consciousness: awake, alert  and oriented  Airway & Oxygen Therapy: Patient Spontanous Breathing and Patient connected to nasal cannula oxygen  Post-op Assessment: Report given to RN and Post -op Vital signs reviewed and stable  Post vital signs: Reviewed and stable  Last Vitals:  Vitals Value Taken Time  BP 167/98 04/25/20 1706  Temp    Pulse 84 04/25/20 1712  Resp 23 04/25/20 1712  SpO2 100 % 04/25/20 1712  Vitals shown include unvalidated device data.  Last Pain:  Vitals:   04/25/20 1438  TempSrc:   PainSc: 7          Complications: No complications documented.

## 2020-04-25 NOTE — Progress Notes (Signed)
Patient ID: Debra Schmidt, female   DOB: 05/30/1961, 59 y.o.   MRN: [id number] BP (!) 167/107 (BP Location: Right Arm)    Pulse 80    Temp 98.6 F (37 C) (Oral)    Resp 15    Ht 5\' 4"  (1.626 m)    Wt 62.6 kg    SpO2 98%    BMI 23.69 kg/m  OR for shoulder debridement.  Remains hypokalemic  Mssa infection. abx changed to cefazolin, at least 6 weeks therapy

## 2020-04-25 NOTE — Progress Notes (Signed)
Paged Dr Franky Macho to notify of lumbar surgical incision weeping a moderate amount of serosanguinous fluid.  Waiting for return page. Debra Schmidt C 11:04 AM

## 2020-04-25 NOTE — Progress Notes (Signed)
Pt transferred to short day stay, Morphine 2mg  IV given, Potassium PO also given per order with sip of water, foley emptied prior to transport. Report given to [facility].

## 2020-04-25 NOTE — Consult Note (Signed)
Physical Medicine and Rehabilitation Consult   Reason for Consult: Functional deficits due to epidural abscess Referring Physician: Dr. Franky Macho   HPI: Debra Schmidt is a 59 y.o. female with history of tobacco use and back pain with progression x72 hours with BLE weakness, constipation and bladder incontinence who was admitted via ED on 04/23/2020.  Potassium 2.0 at admission with elevated BNP and urinary retention with return of 1000 cc urine with Foley placement.  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 Mikal Plane.   Hypomagnesemia/hypokalemia aggressively supplemented.  UDS negative and patient with distant history of IVDU.  She reported pain right shoulder and was noted to have purulent drainage. CT shoulder done and revealed showed soft tissue ulceration on top of shoulder with underlying AC septic arthritis and osteomyelitis of the acromion and distal clavicle with small amount of fluid in subacromial/subdeltoid bursa.  Dr. Orvan Falconer consulted for input on antibiotic regimen and recommended vancomycin alone pending final culture results.  Dr. Eulah Pont consulted and plans for patient to undergo I&D distal clavicle/acromion.  Therapy evaluations completed revealing deficits in mobility and ADLS. CIR recommended due to functional deficits.    Review of Systems  Unable to perform ROS: Other  Constitutional: Negative for chills and fever.    HENT: Negative for hearing loss.   Eyes: Negative for blurred vision and double vision.  Respiratory: Negative for cough and shortness of breath.   Cardiovascular: Negative for chest pain and palpitations.  Gastrointestinal: Positive for abdominal pain and constipation (no BM X one week).     History reviewed. No pertinent past medical history. Past Surgical History:  Procedure Laterality Date  . LUMBAR LAMINECTOMY/DECOMPRESSION MICRODISCECTOMY N/A 04/23/2020   Procedure: Lumbar One-Two LAMINECTOMY for Epidural Abscess Evacuation;  Surgeon: Coletta Memos, MD;  Location: [facility] OR;  Service: Neurosurgery;  Laterality: N/A;    Family History  Problem Relation Age of Onset  . Cancer Mother   . Cancer Father      Social History:  Married. Has been unemployed for a couple of years. She reports that she has been smoking cigarettes. She has been smoking about 0.50 packs per day. She has never used smokeless tobacco. She reports previous alcohol use. She occasional marijuana use and IV drug use few years ago.     Allergies  Allergen Reactions  . Penicillins Hives    Medications Prior to Admission  Medication Sig Dispense Refill  . ibuprofen (ADVIL) 200 MG tablet Take 200-800 mg by mouth every 6 (six) hours as needed for moderate pain.    . phenazopyridine (PYRIDIUM) 95 MG tablet Take 95 mg by mouth 3 (three) times daily as needed for pain.    Marland Kitchen docusate sodium (STOOL SOFTENER) 100 MG capsule Take 100 mg by mouth daily as needed for mild constipation. (Patient not taking: Reported on 04/23/2020)    . methocarbamol (ROBAXIN) 500 MG tablet Take 1 tablet (500 mg total) by mouth every 8 (eight) hours as needed for muscle spasms. (Patient not taking: Reported on 04/23/2020)  10 tablet 0  . potassium chloride SA (KLOR-CON) 20 MEQ tablet Take 1 tablet (20 mEq total) by mouth 2 (two) times daily. (Patient not taking: Reported on 04/23/2020) 10 tablet 0    Home: Home Living Family/patient expects to be  discharged to:: Private residence Living Arrangements: Spouse/significant other (husband works) Available Help at Discharge: Family Type of Home: House Home Access: Stairs to enter Secretary/administrator of Steps: 2 Entrance Stairs-Rails: Right, Left, Can reach both Home Layout: One level Bathroom Shower/Tub: Tub/shower unit, Health visitor: Standard Home Equipment: Environmental consultant - 4 wheels  Functional History: Prior Function Level of Independence: Needs assistance Gait / Transfers Assistance Needed: per pt, she was ambulating with rollator for the past two weeks, fell and scraped her R shoulder.  Functional Status:  Mobility: Bed Mobility Overal bed mobility: Needs Assistance Bed Mobility: Rolling, Sidelying to Sit Rolling: Min assist Sidelying to sit: Mod assist General bed mobility comments: Verbally reviewed log roll, assisted pt in flexion bil knees and rolling to her side, mod assist to come up to sitting on the side of the bed.  Transfers Overall transfer level: Needs assistance Equipment used: Rolling walker (2 wheeled), 2 person hand held assist Transfers: Sit to/from Stand, Stand Pivot Transfers Sit to Stand: +2 physical assistance, Mod assist Stand pivot transfers: +2 physical assistance, Mod assist General transfer comment: Stood twice EOB, pt unable to maintain for very long before sitting again, did not do well with stepping and side stepping in standing, so moved RW out of the way for pivotal steps to the chair with two person hand held assist.  Ambulation/Gait General Gait Details: Not safe to attempt at this time.     ADL:    Cognition: Cognition Overall Cognitive Status: No family/caregiver present to determine baseline cognitive functioning Orientation Level: Oriented X4 Cognition Arousal/Alertness: Awake/alert Behavior During Therapy: Restless Overall Cognitive Status: No family/caregiver present to determine baseline cognitive  functioning General Comments: Pt restless in the bed, but answering all of my PLOF/Hx questions for me, a bit lethargic, but that could be meds?   Blood pressure 103/89, pulse 87, temperature 98.5 F (36.9 C), temperature source Oral, resp. rate (!) 30, height  (1.626 m), weight 62.6 kg, SpO2 97 %.  Physical Exam General: Sedated HEENT: Head is normocephalic, atraumatic, PERRLA, EOMI, sclera anicteric, oral mucosa pink and moist, poor dentition Neck: Supple without JVD or lymphadenopathy Heart: Reg rate and rhythm. No murmurs rubs or gallops Chest: CTA bilaterally without wheezes, rales, or rhonchi; no distress GI: She exhibits distension. There is abdominal tenderness.  Extremities: No clubbing, cyanosis, or edema. Pulses are 2+ Skin: Clean and intact without signs of breakdown Neuro: Sedated and needed max cues to answer/participate in exam. Musculoskeletal:  Posture slumped. Full neck ROM.  Psych: Sedated  Results for orders placed or performed during the hospital encounter of 04/23/20 (from the past 24 hour(s))  Phosphorus     Status: None   Collection Time: 04/24/20 10:39 AM  Result Value Ref Range   Phosphorus 3.8 2.5 - 4.6 mg/dL  Phosphorus     Status: Abnormal   Collection Time: 04/25/20  5:28 AM  Result Value Ref Range   Phosphorus 2.3 (L) 2.5 - 4.6 mg/dL  CBC     Status: Abnormal   Collection Time: 04/25/20  7:54 AM  Result Value Ref Range   WBC 14.2 (H) 4.0 - 10.5 K/uL   RBC 3.26 (L) 3.87 - 5.11 MIL/uL   Hemoglobin 8.9 (L)  12.0 - 15.0 g/dL   HCT 57.2 (L) 36 - 46 %   MCV 84.7 80.0 - 100.0 fL   MCH 27.3 26.0 - 34.0 pg   MCHC 32.2 30.0 - 36.0 g/dL   RDW 62.0 35.5 - 97.4 %   Platelets 347 150 - 400 K/uL   nRBC 0.0 0.0 - 0.2 %   DG Thoracolumabar Spine  Result Date: 04/24/2020 CLINICAL DATA:  L1-2 laminectomy EXAM: THORACOLUMBAR SPINE 1V COMPARISON:  Intraoperative imaging earlier FINDINGS: Single cross-table lateral view of the lumbar spine demonstrates  posterior surgical instruments directed at the L1-2 level. IMPRESSION: Intraoperative localization as above. Electronically Signed   By: Charlett Nose M.D.   On: 04/24/2020 01:11   DG Thoracolumabar Spine  Result Date: 04/23/2020 CLINICAL DATA:  Elective surgery EXAM: THORACOLUMBAR SPINE 1V COMPARISON:  None. FINDINGS: Cross-table lateral views of the lumbar spine are submitted. Final image demonstrates posterior surgical instruments directed at the T12-L1 level. IMPRESSION: Intraoperative localization as above. Electronically Signed   By: Charlett Nose M.D.   On: 04/23/2020 23:30   MR THORACIC SPINE WO CONTRAST  Addendum Date: 04/23/2020   ADDENDUM REPORT: 04/23/2020 18:36 ADDENDUM: Critical findings discussed by telephone with Dr. Gwyneth Sprout on 04/23/2020 at 18:26. Electronically Signed   By: Odessa Fleming M.D.   On: 04/23/2020 18:36   Result Date: 04/23/2020 CLINICAL DATA:  59 year old female with low back pain, urinary incontinence. EXAM: MRI THORACIC SPINE WITHOUT CONTRAST TECHNIQUE: Multiplanar, multisequence MR imaging of the thoracic spine was performed. No intravenous contrast was administered. COMPARISON:  Lumbar MRI today reported separately. CT Abdomen and Pelvis 02/24/2020. FINDINGS: Limited cervical spine imaging:  Negative. Thoracic spine segmentation: Appears to be normal, and concordant with the lumbar numbering today. Alignment: Mildly exaggerated thoracic kyphosis. No spondylolisthesis. Vertebrae: No acute osseous abnormality identified in the thoracic spine, thoracic marrow signal appears to remain normal. Abnormal L1 and L2 levels, detailed separately today. Cord:  Axial images degraded by motion. Abnormal thoracic spinal cord T2 and STIR hyperintensity within the central cord beginning at the T8 vertebral level (series 5, image 12, series 8, image 31) and continuing to the cauda equina. Mildly abnormal central cord signal may extend as high as the T4 level, but above that the cord  appears within normal limits. Superimposed bulky abnormal intraspinal T2 and STIR hyperintense signal in the lower thoracic spinal canal beginning at T10-T11 (series 6, image 10) and continuing into the lumbar spinal canal. Axial images through the segments are degraded by motion but the cord appears primarily displaced and compressed posteriorly by the bulky fluid signal space-occupying mass (series 12, image 29). The dorsal thoracic epidural space is probably abnormal as high as the T9 level. Paraspinal and other soft tissues: The lower thoracic paraspinal soft tissues become abnormal at the T11-T12 level, with STIR hyperintensity compatible with inflammation. Abnormality continues into the lumbar spine reported separately. Grossly negative visible thoracic and upper abdominal viscera. Disc levels: Mild thoracic spine degeneration with the exception of T8-T9 where disc desiccation and disc space loss are accompanied by disc bulging. No spinal or foraminal stenosis associated. IMPRESSION: 1. Positive for compression of the lower thoracic spinal cord and conus by what appears to be a bulky multiloculated intraspinal fluid collection emanating from the abnormal lumbar L1-L2 level (detailed separately). 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 2. Severe abnormal signal in the spinal cord - especially from T8 inferiorly - could be cord edema from compression, but consider also Venous Cord Infarction  from septic spinal venous thrombophlebitis. 3. Recommend Neurosurgery, Neurology, and Infectious Disease consultation. Electronically Signed: By: Odessa Fleming M.D. On: 04/23/2020 18:20   MR LUMBAR SPINE WO CONTRAST  Result Date: 04/23/2020 CLINICAL DATA:  59 year old female with low back pain, urinary incontinence. EXAM: MRI LUMBAR SPINE WITHOUT CONTRAST TECHNIQUE: Multiplanar, multisequence MR imaging of the lumbar spine was performed. No intravenous contrast was administered. COMPARISON:   CT Abdomen and Pelvis 03/25/2020. Thoracic spine MRI today reported separately. FINDINGS: Segmentation: Normal, concordant with the thoracic spine numbering today. Alignment: Stable lumbar lordosis, subtle new retrolisthesis of L2 on L3. Vertebrae: Confluent vertebral body edema at L2 and L3 with intervening abnormal disc space with fluid and endplate erosions. The posterior elements of these levels appear relatively spared although there is abnormal facet joint fluid bilaterally, which is not seen at the adjacent spinal levels. No other marrow edema or evidence of acute osseous abnormality. Intact visible sacrum and SI joints. Conus medullaris and cauda equina: Highly abnormal as described on the thoracic MRI today. Axial images on this exam are also degraded by motion, but the abnormal cord can be seen displaced dorsally beginning at the T11 level (series 3, image 7). The conus medullaris is difficult to delineate. And there is ventral (lower thoracic) and complete (L1 and lower) filling of the spinal canal by multiloculated appearing T2 and STIR hyperintense material which is likely abscess. Severe spinal stenosis is present from L1 through L4. Possibly more normal appearance of the thecal sac beginning at L5, although the cauda equina nerve roots there are difficult to delineate. Paraspinal and other soft tissues: Positive for lateral paraspinal abscesses beginning at the T12 level and maximal at L1-L2-greater on the right (series 3, image 3). The individual paraspinal abscesses are up to 24 mm. Associated edema in the bilateral upper lumbar psoas muscles, which become more normal by the L4 level. Grossly negative visible abdominal viscera. Posteriorly there is bilateral erector spinae muscle edema, although no posterior paraspinal abscess. Disc levels: Outside of L1-L2, the intervertebral discs appear normal except for chronic degeneration at L3-L4 and L4-L5. There is superimposed degenerative bilateral lumbar  facet hypertrophy, maximal at L4-L5 where facet joint fluid is also noted. IMPRESSION: 1. 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 Septic arthritis of the L1-L2 facets is also possible. 2. 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 3. Superimposed lateral paraspinal soft tissue abscesses, greater on the right maximal at L1-L2 and individually up to 2.4 cm. Salient findings discussed by telephone with Dr. Gwyneth Sprout in the ED on 04/23/2020 at 18:28 . Electronically Signed   By: Odessa Fleming M.D.   On: 04/23/2020 18:34   CT SHOULDER RIGHT W CONTRAST  Result Date: 04/24/2020 CLINICAL DATA:  Fall several weeks ago with right shoulder wound now with surrounding redness and purulent drainage. Admitted with L1-L2 osteomyelitis-discitis and large epidural abscess. EXAM: CT OF THE UPPER RIGHT EXTREMITY WITH CONTRAST TECHNIQUE: Multidetector CT imaging of the right shoulder was performed according to the standard protocol following intravenous contrast administration. COMPARISON:  None. CONTRAST:  47mL OMNIPAQUE IOHEXOL 300 MG/ML  SOLN FINDINGS: Bones/Joint/Cartilage Erosive changes of the distal clavicle and acromion. Acromioclavicular joint effusion with distention of the joint capsule. No fracture or dislocation. No glenohumeral joint effusion. Small amount of fluid in the subacromial/subdeltoid bursa. Ligaments Ligaments are suboptimally evaluated by CT. Muscles  and Tendons Grossly intact.  No muscle atrophy. Soft tissue Soft tissue ulceration on the top of the shoulder with underlying inflammatory changes extending to the acromioclavicular joint. No discrete fluid collection. Small reactive right axillary lymph nodes. IMPRESSION: 1. Soft tissue ulceration on the top of the shoulder with underlying acromioclavicular septic arthritis and  osteomyelitis of the acromion and distal clavicle. 2. Small amount of fluid in the subacromial/subdeltoid bursa. Septic bursitis is not excluded. 3. No glenohumeral erosive changes or joint effusion. Electronically Signed   By: Obie DredgeWilliam T Derry M.D.   On: 04/24/2020 10:15   ECHOCARDIOGRAM COMPLETE  Result Date: 04/24/2020    ECHOCARDIOGRAM REPORT   Patient Name:   Hendricks LimesSHERRI L Bally Date of Exam: 04/24/2020 Medical Rec #:  [id number]      Height:       64.0 in Accession #:    [id number]     Weight:       138.0 lb Date of Birth:  1961-03-23     BSA:          1.671 m Patient Age:    58 years       BP:           177/93 mmHg Patient Gender: F              HR:           87 bpm. Exam Location:  Inpatient Procedure: 2D Echo, Color Doppler and Cardiac Doppler Indications:    Endocarditis  History:        Patient has no prior history of Echocardiogram examinations.  Sonographer:    Irving BurtonEmily Senior RDCS Referring Phys: [id number] MURALI RAMASWAMY IMPRESSIONS  1. Left ventricular ejection fraction, by estimation, is 65 to 70%. The left ventricle has normal function. The left ventricle has no regional wall motion abnormalities. Left ventricular diastolic parameters were normal.  2. Right ventricular systolic function is normal. The right ventricular size is normal. Tricuspid regurgitation signal is inadequate for assessing PA pressure.  3. The mitral valve is normal in structure. No evidence of mitral valve regurgitation.  4. The aortic valve is tricuspid. Aortic valve regurgitation is not visualized. No aortic stenosis is present.  5. The inferior vena cava is normal in size with <50% respiratory variability, suggesting right atrial pressure of 8 mmHg.  6. No vegetation seen. If high clinical suspicion for endocarditis, consider TEE FINDINGS  Left Ventricle: Left ventricular ejection fraction, by estimation, is 65 to 70%. The left ventricle has normal function. The left ventricle has no regional wall motion abnormalities. The left  ventricular internal cavity size was normal in size. There is  no left ventricular hypertrophy. Left ventricular diastolic parameters were normal. Right Ventricle: The right ventricular size is normal. No increase in right ventricular wall thickness. Right ventricular systolic function is normal. Tricuspid regurgitation signal is inadequate for assessing PA pressure. Left Atrium: Left atrial size was normal in size. Right Atrium: Right atrial size was normal in size. Pericardium: There is no evidence of pericardial effusion. Mitral Valve: The mitral valve is normal in structure. No evidence of mitral valve regurgitation. Tricuspid Valve: The tricuspid valve is normal in structure. Tricuspid valve regurgitation is not demonstrated. Aortic Valve: The aortic valve is tricuspid. Aortic valve regurgitation is not visualized. No aortic stenosis is present. Pulmonic Valve: The pulmonic valve was not well visualized. Pulmonic valve regurgitation is not visualized. Aorta: The aortic root and ascending aorta are structurally normal, with no evidence of dilitation. Venous: The inferior  vena cava is normal in size with less than 50% respiratory variability, suggesting right atrial pressure of 8 mmHg. IAS/Shunts: The interatrial septum was not well visualized.  LEFT VENTRICLE PLAX 2D LVIDd:         4.40 cm  Diastology LVIDs:         2.45 cm  LV e' lateral:   10.90 cm/s LV PW:         0.90 cm  LV E/e' lateral: 11.6 LV IVS:        0.90 cm  LV e' medial:    10.60 cm/s LVOT diam:     1.90 cm  LV E/e' medial:  11.9 LV SV:         62 LV SV Index:   37 LVOT Area:     2.84 cm  RIGHT VENTRICLE RV S prime:     10.90 cm/s TAPSE (M-mode): 2.4 cm LEFT ATRIUM             Index       RIGHT ATRIUM           Index LA diam:        3.20 cm 1.92 cm/m  RA Area:     10.70 cm LA Vol (A2C):   35.5 ml 21.25 ml/m RA Volume:   19.90 ml  11.91 ml/m LA Vol (A4C):   49.5 ml 29.62 ml/m LA Biplane Vol: 42.6 ml 25.49 ml/m  AORTIC VALVE LVOT Vmax:    120.00 cm/s LVOT Vmean:  70.000 cm/s LVOT VTI:    0.220 m  AORTA Ao Root diam: 3.10 cm Ao Asc diam:  3.20 cm MITRAL VALVE MV Area (PHT): 3.99 cm     SHUNTS MV Decel Time: 190 msec     Systemic VTI:  0.22 m MV E velocity: 126.00 cm/s  Systemic Diam: 1.90 cm MV A velocity: 102.00 cm/s MV E/A ratio:  1.24 Oswaldo Milian MD Electronically signed by Oswaldo Milian MD Signature Date/Time: 04/24/2020/6:44:41 PM    Final      Assessment/Plan: Diagnosis: Severe thoraco-lumbar and right shoulder infections secondary to MSSA bacteremia 1. Does the need for close, 24 hr/day medical supervision in concert with the patient's rehab needs make it unreasonable for this patient to be served in a less intensive setting? Yes 2. Co-Morbidities requiring supervision/potential complications: lumbar epidural abscess, septic arthritis of right AC joint, osteomyelitis of clavicle, hypokalemia, hypomagnesemia, cigarette smoker  3. Due to bladder management, bowel management, safety, skin/wound care, disease management, medication administration, pain management and patient education, does the patient require 24 hr/day rehab nursing? Yes 4. Does the patient require coordinated care of a physician, rehab nurse, therapy disciplines of PT, OT, SLP to address physical and functional deficits in the context of the above medical diagnosis(es)? Yes Addressing deficits in the following areas: balance, endurance, locomotion, strength, transferring, bowel/bladder control, bathing, dressing, feeding, grooming, toileting, cognition, speech, language, swallowing and psychosocial support 5. Can the patient actively participate in an intensive therapy program of at least 3 hrs of therapy per day at least 5 days per week? Yes 6. The potential for patient to make measurable gains while on inpatient rehab is excellent 7. Anticipated functional outcomes upon discharge from inpatient rehab are min assist  with PT, min assist with OT, min  assist with SLP. 8. Estimated rehab length of stay to reach the above functional goals is: 12-16 days 9. Anticipated discharge destination: Home 10. Overall Rehab/Functional Prognosis: excellent  RECOMMENDATIONS: This patient's condition is appropriate for  continued rehabilitative care in the following setting: CIR Patient has agreed to participate in recommended program. N/A Note that insurance prior authorization may be required for reimbursement for recommended care.  Comment: Mrs. Mcmillion would be a good CIR candidate once medically stable, better able to tolerate therapy, and once home support has been confirmed as she is likely to require assistance upon discharge. Active medical issues: K+ today is 2.7, patient is undergoing shoulder surgery, and is receiving IV morphine for pain control. We will continue to follow in Mrs. Dykman's care. Thank you for this consult.   Jacquelynn Cree, PA-C 04/25/2020   I have personally performed a face to face diagnostic evaluation, including, but not limited to relevant history and physical exam findings, of this patient and developed relevant assessment and plan.  Additionally, I have reviewed and concur with the physician assistant's documentation above.  Sula Soda, MD

## 2020-04-25 NOTE — Progress Notes (Signed)
OT Cancellation Note  Patient Details Name: Debra Schmidt MRN: [id number] DOB: Feb 05, 1961   Cancelled Treatment:    Reason Eval/Treat Not Completed: Patient at procedure or test/ unavailable.  Pt currently in surgery.  Will reattempt.  Eber Jones., OTR/L Acute Rehabilitation Services Pager [phone] Office [phone]   Jeani Hawking M 04/25/2020, 1:19 PM

## 2020-04-25 NOTE — Progress Notes (Signed)
Patient ID: Debra Schmidt, female   DOB: 22-Mar-1961, 59 y.o.   MRN: [id number]         [facility]  Date of Admission:  04/23/2020   Total days of antibiotics 3         ASSESSMENT: She has MSSA bacteremia, severe thoracal lumbar infection and right shoulder infection.  We have changed her antibiotics to cefazolin.  She has no evidence of endocarditis by exam or TTE.  I do not think that TEE is warranted since she already has an indication for prolonged IV antibiotic therapy.  PLAN: 1. Continue cefazolin 2. Await results of repeat blood cultures 3. Hold off on PICC placement  Principal Problem:   MSSA bacteremia Active Problems:   Abscess in epidural space of lumbar spine   Septic arthritis of right acromioclavicular joint (HCC)   Osteomyelitis of clavicle (HCC)   Hypokalemia   Hypomagnesemia   Status post surgery   Cigarette smoker   Scheduled Meds: . [MAR Hold] amLODipine  5 mg Oral Daily  . chlorhexidine  60 mL Topical Once  . [MAR Hold] Chlorhexidine Gluconate Cloth  6 each Topical Daily  . [MAR Hold] docusate sodium  100 mg Oral BID  . feeding supplement  296 mL Oral Once  . [MAR Hold] oxyCODONE  15 mg Oral Q12H  . [MAR Hold] pantoprazole (PROTONIX) IV  40 mg Intravenous QHS  . [MAR Hold] potassium chloride  40 mEq Oral Q2H  . povidone-iodine  2 application Topical Once  . [MAR Hold] sodium chloride flush  10-40 mL Intracatheter Q12H  . [MAR Hold] sodium chloride flush  3 mL Intravenous Q12H   Continuous Infusions: . sodium chloride    . 0.9 % NaCl with KCl 20 mEq / L 80 mL/hr at 04/25/20 1000  . [MAR Hold]  ceFAZolin (ANCEF) IV     PRN Meds:.[MAR Hold] acetaminophen **OR** [MAR Hold] acetaminophen, [MAR Hold] bisacodyl, [MAR Hold] diazepam, [MAR Hold] magnesium citrate, [MAR Hold] menthol-cetylpyridinium **OR** [MAR Hold] phenol, [MAR Hold]  morphine injection, [MAR Hold] ondansetron **OR** [MAR Hold] ondansetron (ZOFRAN) IV, [MAR Hold]  oxyCODONE, [MAR Hold] oxyCODONE, [MAR Hold] phenazopyridine, [MAR Hold] senna-docusate, [MAR Hold] sodium chloride flush, [MAR Hold] sodium chloride flush, [MAR Hold] zolpidem   SUBJECTIVE: She is still having severe back pain.  Review of Systems: Review of Systems  Constitutional: Negative for chills, diaphoresis and fever.  Musculoskeletal: Positive for back pain and joint pain.    Allergies  Allergen Reactions  . Penicillins Hives    OBJECTIVE: Vitals:   04/25/20 0800 04/25/20 0900 04/25/20 1000 04/25/20 1113  BP: 103/89 (!) 138/102 (!) 162/139 (!) 167/107  Pulse: 87 86 91 80  Resp: (!) 30 (!) 31 20 15   Temp: 98.5 F (36.9 C)     TempSrc: Oral     SpO2: 97% 95% 99% 98%  Weight:      Height:       Body mass index is 23.69 kg/m.  Physical Exam Constitutional:      Comments: She is more alert today.  She is getting ready to go to the OR for right shoulder surgery  Cardiovascular:     Rate and Rhythm: Normal rate and regular rhythm.     Heart sounds: No murmur heard.   Pulmonary:     Effort: Pulmonary effort is normal.     Breath sounds: Normal breath sounds.     Lab Results Lab Results  Component Value Date   WBC  14.2 (H) 04/25/2020   HGB 8.9 (L) 04/25/2020   HCT 27.6 (L) 04/25/2020   MCV 84.7 04/25/2020   PLT 347 04/25/2020    Lab Results  Component Value Date   CREATININE 0.55 04/25/2020   BUN <5 (L) 04/25/2020   NA 140 04/25/2020   K 2.4 (LL) 04/25/2020   CL 101 04/25/2020   CO2 28 04/25/2020    Lab Results  Component Value Date   ALT 11 04/24/2020   AST 18 04/24/2020   ALKPHOS 64 04/24/2020   BILITOT 0.7 04/24/2020     Microbiology: Recent Results (from the past 240 hour(s))  SARS Coronavirus 2 by RT PCR (hospital order, performed in [facility] hospital lab) Nasopharyngeal Nasopharyngeal Swab     Status: None   Collection Time: 04/23/20  6:51 PM   Specimen: Nasopharyngeal Swab  Result Value Ref Range Status   SARS Coronavirus 2  NEGATIVE NEGATIVE Final    Comment: (NOTE) SARS-CoV-2 target nucleic acids are NOT DETECTED.  The SARS-CoV-2 RNA is generally detectable in upper and lower respiratory specimens during the acute phase of infection. The lowest concentration of SARS-CoV-2 viral copies this assay can detect is 250 copies / mL. A negative result does not preclude SARS-CoV-2 infection and should not be used as the sole basis for treatment or other patient management decisions.  A negative result may occur with improper specimen collection / handling, submission of specimen other than nasopharyngeal swab, presence of viral mutation(s) within the areas targeted by this assay, and inadequate number of viral copies (<250 copies / mL). A negative result must be combined with clinical observations, patient history, and epidemiological information.  Fact Sheet for Patients:   BoilerBrush.com.cy  Fact Sheet for Healthcare Providers: https://pope.com/  This test is not yet approved or  cleared by the [place] FDA and has been authorized for detection and/or diagnosis of SARS-CoV-2 by FDA under an Emergency Use Authorization (EUA).  This EUA will remain in effect (meaning this test can be used) for the duration of the COVID-19 declaration under Section 564(b)(1) of the Act, 21 U.S.C. section 360bbb-3(b)(1), unless the authorization is terminated or revoked sooner.  Performed at [facility], [address], [place], [place] [address]   Blood culture (routine x 2)     Status: Abnormal (Preliminary result)   Collection Time: 04/23/20  7:30 PM   Specimen: BLOOD  Result Value Ref Range Status   Specimen Description BLOOD RIGHT ANTECUBITAL  Final   Special Requests   Final    BOTTLES DRAWN AEROBIC AND ANAEROBIC Blood Culture adequate volume   Culture  Setup Time   Final    AEROBIC BOTTLE ONLY GRAM POSITIVE COCCI IN CLUSTERS Organism ID to  follow CRITICAL RESULT CALLED TO, READ BACK BY AND VERIFIED WITH: Tori Milks [facility] 04/24/20 2317 JDW Performed at [facility], [address] [place], [place] [address]    Culture STAPHYLOCOCCUS AUREUS (A)  Final   Report Status PENDING  Incomplete  Blood Culture ID Panel (Reflexed)     Status: Abnormal   Collection Time: 04/23/20  7:30 PM  Result Value Ref Range Status   Enterococcus species NOT DETECTED NOT DETECTED Final   Listeria monocytogenes NOT DETECTED NOT DETECTED Final   Staphylococcus species DETECTED (A) NOT DETECTED Final    Comment: CRITICAL RESULT CALLED TO, READ BACK BY AND VERIFIED WITH: L SEAY PHARMD 04/24/20 2317 JDW    Staphylococcus aureus (BCID) DETECTED (A) NOT DETECTED Final  Comment: Methicillin (oxacillin) susceptible Staphylococcus aureus (MSSA). Preferred therapy is anti staphylococcal beta lactam antibiotic (Cefazolin or Nafcillin), unless clinically contraindicated. CRITICAL RESULT CALLED TO, READ BACK BY AND VERIFIED WITH: L SEAY PHARMD 04/24/20 2317 JDW    Methicillin resistance NOT DETECTED NOT DETECTED Final   Streptococcus species NOT DETECTED NOT DETECTED Final   Streptococcus agalactiae NOT DETECTED NOT DETECTED Final   Streptococcus pneumoniae NOT DETECTED NOT DETECTED Final   Streptococcus pyogenes NOT DETECTED NOT DETECTED Final   Acinetobacter baumannii NOT DETECTED NOT DETECTED Final   Enterobacteriaceae species NOT DETECTED NOT DETECTED Final   Enterobacter cloacae complex NOT DETECTED NOT DETECTED Final   Escherichia coli NOT DETECTED NOT DETECTED Final   Klebsiella oxytoca NOT DETECTED NOT DETECTED Final   Klebsiella pneumoniae NOT DETECTED NOT DETECTED Final   Proteus species NOT DETECTED NOT DETECTED Final   Serratia marcescens NOT DETECTED NOT DETECTED Final   Haemophilus influenzae NOT DETECTED NOT DETECTED Final   Neisseria meningitidis NOT DETECTED NOT DETECTED Final   Pseudomonas aeruginosa NOT DETECTED NOT DETECTED Final    Candida albicans NOT DETECTED NOT DETECTED Final   Candida glabrata NOT DETECTED NOT DETECTED Final   Candida krusei NOT DETECTED NOT DETECTED Final   Candida parapsilosis NOT DETECTED NOT DETECTED Final   Candida tropicalis NOT DETECTED NOT DETECTED Final    Comment: Performed at [facility], [place] [address] [place], [place] [address]  Blood culture (routine x 2)     Status: None (Preliminary result)   Collection Time: 04/23/20  7:35 PM   Specimen: BLOOD RIGHT FOREARM  Result Value Ref Range Status   Specimen Description BLOOD RIGHT FOREARM  Final   Special Requests   Final    BOTTLES DRAWN AEROBIC AND ANAEROBIC Blood Culture results may not be optimal due to an inadequate volume of blood received in culture bottles   Culture   Final    NO GROWTH 2 DAYS Performed at [facility], [place] [address], [place], [place] [address]    Report Status PENDING  Incomplete  Aerobic/Anaerobic Culture (surgical/deep wound)     Status: None (Preliminary result)   Collection Time: 04/23/20 10:37 PM   Specimen: Abscess  Result Value Ref Range Status   Specimen Description ABSCESS  Final   Special Requests THORACOULMBAR  Final   Gram Stain   Final    FEW WBC PRESENT, PREDOMINANTLY PMN FEW GRAM POSITIVE COCCI CRITICAL RESULT CALLED TO, READ BACK BY AND VERIFIED WITH: RN T ROWE @0107  04/24/20 BY S GEZAHEGN Performed at [facility], [address] [place], [place] [address]    Culture FEW STAPHYLOCOCCUS AUREUS  Final   Report Status PENDING  Incomplete  Aerobic/Anaerobic Culture (surgical/deep wound)     Status: None (Preliminary result)   Collection Time: 04/23/20 10:37 PM   Specimen: Soft Tissue, Other  Result Value Ref Range Status   Specimen Description TISSUE  Final   Special Requests EPIDURAL  Final   Gram Stain   Final    NO WBC SEEN NO ORGANISMS SEEN Performed at [facility], [place] [address] [place], [place] [address]    Culture FEW STAPHYLOCOCCUS AUREUS  Final    Report Status PENDING  Incomplete  MRSA PCR Screening     Status: None   Collection Time: 04/24/20  1:50 AM   Specimen: Nasal Mucosa; Nasopharyngeal  Result Value Ref Range Status   MRSA by PCR NEGATIVE NEGATIVE Final    Comment:  The GeneXpert MRSA Assay (FDA approved for NASAL specimens only), is one component of a comprehensive MRSA colonization surveillance program. It is not intended to diagnose MRSA infection nor to guide or monitor treatment for MRSA infections. Performed at [facility], [address], [place], [place] [address]   Culture, blood (routine x 2)     Status: None (Preliminary result)   Collection Time: 04/24/20  8:20 AM   Specimen: BLOOD LEFT ARM  Result Value Ref Range Status   Specimen Description BLOOD LEFT ARM  Final   Special Requests   Final    BOTTLES DRAWN AEROBIC ONLY Blood Culture adequate volume   Culture   Final    NO GROWTH 1 DAY Performed at [facility], [address], [place], [place] [address]    Report Status PENDING  Incomplete  Culture, blood (routine x 2)     Status: None (Preliminary result)   Collection Time: 04/24/20  8:20 AM   Specimen: BLOOD LEFT ARM  Result Value Ref Range Status   Specimen Description BLOOD LEFT ARM  Final   Special Requests   Final    BOTTLES DRAWN AEROBIC ONLY Blood Culture results may not be optimal due to an inadequate volume of blood received in culture bottles   Culture   Final    NO GROWTH 1 DAY Performed at [facility], [address] [place], [place] [address]    Report Status PENDING  Incomplete  Culture, blood (routine x 2)     Status: None (Preliminary result)   Collection Time: 04/25/20  7:08 AM   Specimen: BLOOD  Result Value Ref Range Status   Specimen Description BLOOD RIGHT ANTECUBITAL  Final   Special Requests   Final    BOTTLES DRAWN AEROBIC AND ANAEROBIC Blood Culture adequate volume   Culture   Final    NO GROWTH < 12 HOURS Performed at [facility], [address], [place], [place] [address]    Report Status PENDING  Incomplete  Culture, blood (routine x 2)     Status: None (Preliminary result)   Collection Time: 04/25/20  7:14 AM   Specimen: BLOOD  Result Value Ref Range Status   Specimen Description BLOOD LEFT ANTECUBITAL  Final   Special Requests   Final    BOTTLES DRAWN AEROBIC AND ANAEROBIC Blood Culture adequate volume   Culture   Final    NO GROWTH < 12 HOURS Performed at [facility], [address], [place], [place] [address]    Report Status PENDING  Incomplete  Surgical PCR screen     Status: None   Collection Time: 04/25/20  7:54 AM   Specimen: Nasal Mucosa; Nasal Swab  Result Value Ref Range Status   MRSA, PCR NEGATIVE NEGATIVE Final   Staphylococcus aureus NEGATIVE NEGATIVE Final    Comment: (NOTE) The Xpert SA Assay (FDA approved for NASAL specimens in patients 40 years of age and older), is one component of a comprehensive surveillance program. It is not intended to diagnose infection nor to guide or monitor treatment. Performed at [facility], [address], [place], [place] [address]     Cliffton Asters, MD [facility] [phone] pager   [phone] cell 04/25/2020, 1:16 PM

## 2020-04-25 NOTE — Anesthesia Preprocedure Evaluation (Signed)
Anesthesia Evaluation  Patient identified by MRN, date of birth, ID band Patient awake    Reviewed: Allergy & Precautions, H&P , NPO status , Patient's Chart, lab work & pertinent test results  Airway Mallampati: II  TM Distance: >3 FB Neck ROM: Full    Dental no notable dental hx. (+) Poor Dentition, Dental Advisory Given   Pulmonary Current Smoker and Patient abstained from smoking.,    Pulmonary exam normal breath sounds clear to auscultation       Cardiovascular negative cardio ROS   Rhythm:Regular Rate:Normal     Neuro/Psych negative neurological ROS  negative psych ROS   GI/Hepatic negative GI ROS, Neg liver ROS,   Endo/Other  negative endocrine ROS  Renal/GU negative Renal ROS  negative genitourinary   Musculoskeletal  (+) Arthritis ,   Abdominal   Peds  Hematology negative hematology ROS (+)   Anesthesia Other Findings   Reproductive/Obstetrics negative OB ROS                             Anesthesia Physical Anesthesia Plan  ASA: III  Anesthesia Plan: General   Post-op Pain Management:    Induction: Intravenous  PONV Risk Score and Plan: 3 and Ondansetron, Dexamethasone and Midazolam  Airway Management Planned: Oral ETT  Additional Equipment:   Intra-op Plan:   Post-operative Plan: Extubation in OR  Informed Consent: I have reviewed the patients History and Physical, chart, labs and discussed the procedure including the risks, benefits and alternatives for the proposed anesthesia with the patient or authorized representative who has indicated his/her understanding and acceptance.     Dental advisory given  Plan Discussed with: CRNA  Anesthesia Plan Comments:         Anesthesia Quick Evaluation

## 2020-04-25 NOTE — Progress Notes (Signed)
    Subjective: Patient underwent successful irrigation, debridement, resection of bone right AC joint today by Dr. Margarita Rana.  Objective:   VITALS:   Vitals:   04/25/20 0800 04/25/20 0900 04/25/20 1000 04/25/20 1113  BP: 103/89 (!) 138/102 (!) 162/139 (!) 167/107  Pulse: 87 86 91 80  Resp: (!) 30 (!) 31 20 15   Temp: 98.5 F (36.9 C)   98.6 F (37 C)  TempSrc: Oral   Oral  SpO2: 97% 95% 99% 98%  Weight:      Height:       CBC Latest Ref Rng & Units 04/25/2020 04/25/2020 04/24/2020  WBC 4.0 - 10.5 K/uL - 14.2(H) 11.3(H)  Hemoglobin 12.0 - 15.0 g/dL 04/26/2020) 8.9(L) 8.7(L)  Hematocrit 36 - 46 % 27.0(L) 27.6(L) 27.3(L)  Platelets 150 - 400 K/uL - 347 366   BMP Latest Ref Rng & Units 04/25/2020 04/25/2020 04/24/2020  Glucose 70 - 99 mg/dL 04/26/2020) 119(J) 478(G)  BUN 6 - 20 mg/dL 4(L) 956(O) <1(H)  Creatinine 0.44 - 1.00 mg/dL <0(Q) 6.57(Q 4.69  Sodium 135 - 145 mmol/L 141 140 140  Potassium 3.5 - 5.1 mmol/L 2.7(LL) 2.4(LL) 2.2(LL)  Chloride 98 - 111 mmol/L 97(L) 101 101  CO2 22 - 32 mmol/L - 28 29  Calcium 8.9 - 10.3 mg/dL - 8.5(L) 8.8(L)   Intake/Output      06/14 0701 - 06/15 0700 06/15 0701 - 06/16 0700   P.O. 480    I.V. (mL/kg) 1968.4 (31.4) 227.5 (3.6)   IV Piggyback 403.1    Total Intake(mL/kg) 2851.4 (45.6) 227.5 (3.6)   Urine (mL/kg/hr) 2900 (1.9) 1150 (1.8)   Blood     Total Output 2900 1150   Net -48.6 -922.5           Assessment: Day of Surgery  S/P Procedure(s) (LRB): Open debridement R AC joint; Distal clavicle repair (Right) ACROMIO-CLAVICULAR JOINT REPAIR (Right) RESECTION DISTAL CLAVICAL (Right) by Dr. 7/16. Jewel Baize on 04/25/20  Principal Problem:   MSSA bacteremia Active Problems:   Abscess in epidural space of lumbar spine   Hypokalemia   Hypomagnesemia   Status post surgery   Septic arthritis of right acromioclavicular joint (HCC)   Osteomyelitis of clavicle (HCC)   Cigarette smoker   Right Septic A/C Joint S/p I/D, resection of bone  04/25/20  Plan:  Weightbearing: No strenuous 04/27/20 of Right arm.  Nothing overhead. Insicional and dressing care: Dressings left intact until follow-up Orthopedic device(s): Sling if needed for comfort Showering: Keep dressing dry Follow - up plan: 2 weeks  In the office with Dr. [place].  Please call with questions.   Wandra Feinstein III, PA-C 04/25/2020, 5:03 PM

## 2020-04-25 NOTE — Progress Notes (Signed)
CRITICAL VALUE ALERT  Critical Value: Potassium 2.7  Date & Time Notied:  04/25/20 1405  Provider Notified: Aleene Davidson  Orders Received/Actions taken: Give 3 runs of potassium

## 2020-04-26 ENCOUNTER — Encounter (HOSPITAL_COMMUNITY): Payer: Self-pay | Admitting: Orthopedic Surgery

## 2020-04-26 DIAGNOSIS — G061 Intraspinal abscess and granuloma: Principal | ICD-10-CM

## 2020-04-26 DIAGNOSIS — E876 Hypokalemia: Secondary | ICD-10-CM

## 2020-04-26 LAB — CBC
HCT: 27.5 % — ABNORMAL LOW (ref 36.0–46.0)
Hemoglobin: 8.9 g/dL — ABNORMAL LOW (ref 12.0–15.0)
MCH: 27.1 pg (ref 26.0–34.0)
MCHC: 32.4 g/dL (ref 30.0–36.0)
MCV: 83.6 fL (ref 80.0–100.0)
Platelets: 379 10*3/uL (ref 150–400)
RBC: 3.29 MIL/uL — ABNORMAL LOW (ref 3.87–5.11)
RDW: 14.9 % (ref 11.5–15.5)
WBC: 16.2 10*3/uL — ABNORMAL HIGH (ref 4.0–10.5)
nRBC: 0 % (ref 0.0–0.2)

## 2020-04-26 LAB — BASIC METABOLIC PANEL
Anion gap: 10 (ref 5–15)
Anion gap: 10 (ref 5–15)
BUN: <5 mg/dL — ABNORMAL LOW (ref 6–20)
BUN: 5 mg/dL — ABNORMAL LOW (ref 6–20)
CO2: 29 mmol/L (ref 22–32)
CO2: 28 mmol/L (ref 22–32)
Calcium: 8 mg/dL — ABNORMAL LOW (ref 8.9–10.3)
Calcium: 8.2 mg/dL — ABNORMAL LOW (ref 8.9–10.3)
Chloride: 97 mmol/L — ABNORMAL LOW (ref 98–111)
Chloride: 100 mmol/L (ref 98–111)
Creatinine, Ser: 0.5 mg/dL (ref 0.44–1.00)
Creatinine, Ser: 0.55 mg/dL (ref 0.44–1.00)
GFR calc Af Amer: >60 mL/min (ref >60)
GFR calc Af Amer: >60 mL/min (ref >60)
GFR calc non Af Amer: >60 mL/min (ref >60)
GFR calc non Af Amer: >60 mL/min (ref >60)
Glucose, Bld: 97 mg/dL (ref 70–99)
Glucose, Bld: 96 mg/dL (ref 70–99)
Potassium: 2.5 mmol/L — CL (ref 3.5–5.1)
Potassium: 4.1 mmol/L (ref 3.5–5.1)
Sodium: 136 mmol/L (ref 135–145)
Sodium: 138 mmol/L (ref 135–145)

## 2020-04-26 LAB — CULTURE, BLOOD (ROUTINE X 2): Special Requests: ADEQUATE

## 2020-04-26 LAB — CORTISOL: Cortisol, Plasma: 16.9 ug/dL

## 2020-04-26 MED ORDER — POTASSIUM CHLORIDE CRYS ER 20 MEQ PO TBCR: 100.0000 meq | EXTENDED_RELEASE_TABLET | Freq: Once | ORAL | Status: DC

## 2020-04-26 MED ORDER — METOPROLOL TARTRATE 5 MG/5ML IV SOLN
2.5000 mg | INTRAVENOUS | Status: DC | PRN
  Administered 2020-04-26 – 2020-04-27 (×6): 2.5 mg via INTRAVENOUS
  Filled 2020-04-26 (×6): qty 5

## 2020-04-26 MED ORDER — LISINOPRIL 5 MG PO TABS
5.0000 mg | ORAL_TABLET | Freq: Every day | ORAL | Status: DC
  Administered 2020-04-26 – 2020-04-27 (×2): 5 mg via ORAL
  Filled 2020-04-26 (×2): qty 1

## 2020-04-26 MED ORDER — POTASSIUM CHLORIDE CRYS ER 20 MEQ PO TBCR
60.0000 meq | EXTENDED_RELEASE_TABLET | Freq: Two times a day (BID) | ORAL | Status: DC
  Administered 2020-04-26: 60 meq via ORAL
  Filled 2020-04-26: qty 3

## 2020-04-26 NOTE — Evaluation (Signed)
Occupational Therapy Evaluation Patient Details Name: Debra Schmidt MRN: [id number] DOB: 1961-07-28 Today's Date: 04/26/2020    History of Present Illness 59 y.o. female admitted on 04/23/20 for emergent decompression of throacolumbar epidural abcess, discitis L1/2 s/p laminectomy L1/2 and evacuation of abcess.  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  Infectious disease is following suspicious that both R shoulder and back are staph infections.  Blood cultures are pending.  Pt with no significant medical history listed in chart.   Clinical Impression   PTA pt living with spouse, was independent for BADL/IADL until recently. Pt had fall prior to admission. At time of eval, pt completing bed mobility and sit <> stand with mod A +2. Pt mostly limited by pain, and needing cues for safety due to spinal and RUE precautions. Educated pt on surgeon's recommendations for management of RUE post op. Pt able to transfer to [facility] this date, but having issues and discomfort from urinary retention. RN requesting pt back to bed for catheterization. Based on current level, recommend pt d/c to CIR for continued progression of BADL. Will continue to follow per POC listed below.     Follow Up Recommendations  CIR    Equipment Recommendations  3 in 1 bedside commode    Recommendations for Other Services       Precautions / Restrictions Precautions Precautions: Fall;Back Precaution Comments: verbally reviewed throughout Required Braces or Orthoses: Spinal Brace;Sling Spinal Brace: Lumbar corset;Applied in sitting position Restrictions Weight Bearing Restrictions: Yes RUE Weight Bearing: Partial weight bearing RUE Partial Weight Bearing Percentage or Pounds: Per ortho note "no strenuous movement" Other Position/Activity Restrictions: No overhead movement with RUE; sling for comfort      Mobility Bed Mobility Overal bed  mobility: Needs Assistance Bed Mobility: Rolling;Sidelying to Sit Rolling: Min assist Sidelying to sit: Mod assist;+2 for safety/equipment       General bed mobility comments: min A with use of bed pad to roll; cues for log roll technique, assist to guide LEs to EOB + support at trunk to attain sitting  Transfers Overall transfer level: Needs assistance Equipment used: 2 person hand held assist Transfers: Sit to/from [employer] Sit to Stand: Mod assist;+2 physical assistance Stand pivot transfers: Mod assist;+2 safety/equipment       General transfer comment: mod A +2 to rise and steady with transfers, +2 person used for safety    Balance Overall balance assessment: Needs assistance Sitting-balance support: Feet supported;Bilateral upper extremity supported Sitting balance-Leahy Scale: Fair     Standing balance support: Bilateral upper extremity supported Standing balance-Leahy Scale: Poor Standing balance comment: reliant on external support                           ADL either performed or assessed with clinical judgement   ADL Overall ADL's : Needs assistance/impaired Eating/Feeding: Set up;Sitting   Grooming: Set up;Sitting   Upper Body Bathing: Minimal assistance;Sitting   Lower Body Bathing: Moderate assistance;Maximal assistance;Sitting/lateral leans;Sit to/from stand   Upper Body Dressing : Minimal assistance;Sitting   Lower Body Dressing: Moderate assistance;Maximal assistance;Sitting/lateral leans;Sit to/from stand   Toilet Transfer: Moderate assistance;Stand-pivot;BSC Toilet Transfer Details (indicate cue type and reason): to [facility] placed by bed, pt needing mod A to power and steady, cues for hand placement Toileting- Clothing Manipulation and Hygiene: Moderate assistance;Sitting/lateral lean;Sit to/from stand Toileting - Clothing Manipulation Details (indicate cue type and  reason): pt also has difficulty with urinary retention  limiting BADL participation unless regularly catheterized     Functional mobility during ADLs: Moderate assistance;Cueing for sequencing;Cueing for safety;Rolling walker       Vision Patient Visual Report: No change from baseline       Perception     Praxis      Pertinent Vitals/Pain Pain Assessment: Faces Faces Pain Scale: Hurts even more Pain Location: back; full bladder Pain Descriptors / Indicators: Grimacing;Guarding;Restless Pain Intervention(s): Limited activity within patient's tolerance;Monitored during session;Repositioned     Hand Dominance Left   Extremity/Trunk Assessment Upper Extremity Assessment Upper Extremity Assessment: Generalized weakness;RUE deficits/detail RUE Deficits / Details: recent [facility] joint debriedment   Lower Extremity Assessment Lower Extremity Assessment: Defer to PT evaluation       Communication     Cognition Arousal/Alertness: Awake/alert Behavior During Therapy: WFL for tasks assessed/performed Overall Cognitive Status: No family/caregiver present to determine baseline cognitive functioning Area of Impairment: Memory;Safety/judgement                     Memory: Decreased recall of precautions   Safety/Judgement: Decreased awareness of safety;Decreased awareness of deficits     General Comments: pt distracted by pain and discomfort in sore bladder due to inability to urinate. overall decr awareness of precautions   General Comments       Exercises     Shoulder Instructions      Home Living Family/patient expects to be discharged to:: Private residence Living Arrangements: Spouse/significant other Available Help at Discharge: Family Type of Home: House Home Access: Stairs to enter [employer] of Steps: 2 Entrance Stairs-Rails: Right;Left;Can reach both Home Layout: One level     Bathroom Shower/Tub: Tub/shower unit;Walk-in shower   Bathroom Toilet: Standard     Home Equipment: Walker - 4  wheels          Prior Functioning/Environment Level of Independence: Needs assistance  Gait / Transfers Assistance Needed: per pt, she was ambulating with rollator for the past two weeks, fell and scraped her R shoulder.               OT Problem List: Decreased strength;Decreased knowledge of use of DME or AE;Decreased knowledge of precautions;Decreased activity tolerance;Impaired balance (sitting and/or standing);Pain;Decreased safety awareness      OT Treatment/Interventions: Self-care/ADL training;Therapeutic exercise;Patient/family education;Balance training;Energy conservation;Therapeutic activities;DME and/or AE instruction    OT Goals(Current goals can be found in the care plan section) Acute Rehab OT Goals Patient Stated Goal: retun to independence OT Goal Formulation: With patient Time For Goal Achievement: 05/10/20 Potential to Achieve Goals: Good  OT Frequency: Min 2X/week   Barriers to D/C:            Co-evaluation              AM-PAC OT "6 Clicks" Daily Activity     Outcome Measure Help from another person eating meals?: None Help from another person taking care of personal grooming?: A Little Help from another person toileting, which includes using toliet, bedpan, or urinal?: A Lot Help from another person bathing (including washing, rinsing, drying)?: A Lot Help from another person to put on and taking off regular upper body clothing?: A Little Help from another person to put on and taking off regular lower body clothing?: A Lot 6 Click Score: 16   End of Session Equipment Utilized During Treatment: Gait belt;Back brace Nurse Communication: Mobility status  Activity Tolerance: Patient tolerated treatment well Patient left:  in bed;with call bell/phone within reach  OT Visit Diagnosis: Unsteadiness on feet (R26.81);Other abnormalities of gait and mobility (R26.89);Muscle weakness (generalized) (M62.81);Pain Pain - part of body:  (back)                 Time: 2575-0518 OT Time Calculation (min): 23 min Charges:  OT General Charges $OT Visit: 1 Visit OT Evaluation $OT Eval Moderate Complexity: 1 Mod  Dalphine Handing, MSOT, OTR/L Acute Rehabilitation Services [facility] Office Number: [phone] Pager: [phone]  Dalphine Handing 04/26/2020, 1:40 PM

## 2020-04-26 NOTE — Progress Notes (Signed)
PROGRESS NOTE    Debra Schmidt  ZOX:[id number] DOB: 07-21-1961 DOA: 04/23/2020 PCP: Patient, No Pcp Per    Brief Narrative:  59 y.o.female with remote history of Xanax and hydrocodone use presented to the ED 6/13 , she states that she fell in the bathtub several weeks ago injuring her right shoulder, eventually developed a draining wound on the top of her shoulder. She began to have progressively more severe back pain left urinary incontinence leading to admission 6/13. MRI showed L1-2 discitis with thoracolumbar abscess. She underwent emergent surgery by Dr. 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  Assessment & Plan:   Principal Problem:   MSSA bacteremia Active Problems:   Abscess in epidural space of lumbar spine   Hypokalemia   Hypomagnesemia   Status post surgery   Septic arthritis of right acromioclavicular joint (HCC)   Osteomyelitis of clavicle (HCC)   Cigarette smoker    MSSA bacteremia Epidural abscess with cauda equina Right shoulder septic arthritis and clavicular osteomyelitis -Continue IV vancomycin, infectious disease following -Antibiotics per ID recommendations, possible PICC placement 6/17 -Underwent L1/2 laminectomy, epidural abscess evacuation on 6/13 -Follow-up OR cultures, anticipate MSSA -Check repeat blood cultures -Pt underwent I&D and distal clavicle resection 6/15 -PT OT  Chronic hypokalemia -Etiology not clear -Chart reviewed. Pt has hx of profound hypokalemia, first documented here on 02/24/20 with a K of 2.7 -She recalls being followed for hypokalemia when she was following up with her PCP over 5 years ago. Currently not seeing PCP -Mag level okay -Continue to replace K as tolerated. Will repeat BMET this afternoon -Will check cortisol level  Acute on chronic anemia -The setting of severe infections and surgery -Cont to  monitor  Hypertension -BP trends currently suboptimally controlled -have ordered PRN lopressor IV -Will start on low dose lisinopril. Pt aware of risk for angioedema, agrees with regimen   DVT prophylaxis: SCD's Code Status: Full Family Communication: Pt in room, family not at bedside  Consultants:   Orthopedic surgery  ID  Procedures:  PRE-OPERATIVE DIAGNOSIS: Epidural Mass  POST-OPERATIVE DIAGNOSIS: Epidural Abscess, L1/2 discitis  PROCEDURE: Procedure(s): Lumbar One-Two LAMINECTOMY for Epidural Abscess Evacuation  SURGEON:Surgeon(s):  Coletta Memos, MD   Antimicrobials: Anti-infectives (From admission, onward)   Start     Dose/Rate Route Frequency Ordered Stop   04/25/20 1641  vancomycin (VANCOCIN) powder  Status:  Discontinued          As needed 04/25/20 1641 04/25/20 1657   04/25/20 1200  ceFAZolin (ANCEF) IVPB 2g/100 mL premix     Discontinue     2 g 200 mL/hr over 30 Minutes Intravenous Every 8 hours 04/25/20 0930     04/24/20 1200  vancomycin (VANCOREADY) IVPB 750 mg/150 mL  Status:  Discontinued        750 mg 150 mL/hr over 60 Minutes Intravenous Every 12 hours 04/24/20 0156 04/25/20 0930   04/24/20 0800  piperacillin-tazobactam (ZOSYN) IVPB 3.375 g  Status:  Discontinued        3.375 g 12.5 mL/hr over 240 Minutes Intravenous Every 8 hours 04/24/20 0156 04/24/20 1423   04/23/20 2215  piperacillin-tazobactam (ZOSYN) IVPB 3.375 g        3.375 g 100 mL/hr over 30 Minutes Intravenous  Once 04/23/20 2202 04/24/20 0031   04/23/20 2215  vancomycin (VANCOREADY) IVPB 1250 mg/250 mL        1,250 mg 166.7 mL/hr over 90 Minutes Intravenous  Once 04/23/20 2202 04/24/20 0101       Subjective: Without complaints this AM  Objective: Vitals:   04/26/20 0740 04/26/20 1011 04/26/20 1131 04/26/20 1200  BP: (!) 176/98 (!) 170/91 (!) 176/111 (!) 172/95  Pulse: 99 91  93  Resp: (!) 26 (!) 21  (!) 27  Temp: 98 F (36.7 C) 98.5 F (36.9 C)  98.6 F (37 C)   TempSrc:  Oral  Oral  SpO2: 95% 98%  95%  Weight:      Height:        Intake/Output Summary (Last 24 hours) at 04/26/2020 1549 Last data filed at 04/26/2020 1130 Gross per 24 hour  Intake 1940 ml  Output 3700 ml  Net -1760 ml   Filed Weights   04/23/20 1332  Weight: 62.6 kg    Examination:  General exam: Appears calm and comfortable  Respiratory system: Clear to auscultation. Respiratory effort normal. Cardiovascular system: S1 & S2 heard, Regular Gastrointestinal system: Abdomen is nondistended, soft and nontender. No organomegaly or masses felt. Normal bowel sounds heard. Central nervous system: Alert and oriented. No focal neurological deficits. Extremities: Symmetric 5 x 5 power. Skin: No rashes, lesions Psychiatry: Judgement and insight appear normal. Mood & affect appropriate.   Data Reviewed: I have personally reviewed following labs and imaging studies  CBC: Recent Labs  Lab 04/23/20 1339 04/23/20 2056 04/23/20 2356 04/24/20 0605 04/25/20 0754 04/25/20 1402 04/26/20 0630  WBC 9.1  --   --  11.3* 14.2*  --  16.2*  HGB 10.4*   < > 8.5* 8.7* 8.9* 9.2* 8.9*  HCT 32.2*   < > 25.0* 27.3* 27.6* 27.0* 27.5*  MCV 84.7  --   --  84.8 84.7  --  83.6  PLT 407*  --   --  366 347  --  379   < > = values in this interval not displayed.   Basic Metabolic Panel: Recent Labs  Lab 04/23/20 1339 04/23/20 1339 04/23/20 2054 04/23/20 2054 04/23/20 2056 04/23/20 2056 04/23/20 2356 04/24/20 0605 04/24/20 1039 04/25/20 0528 04/25/20 0754 04/25/20 1402 04/26/20 0630  NA 140   < > 140   < > 143   < > 143 140  --   --  140 141 136  K 2.0*   < > 2.0*   < > 2.0*   < > 2.1* 2.2*  --   --  2.4* 2.7* 2.5*  CL 97*   < > 99   < > 99  --   --  101  --   --  101 97* 97*  CO2 27  --  28  --   --   --   --  29  --   --  28  --  29  GLUCOSE 143*   < > 161*   < > 157*  --   --  120*  --   --  119* 103* 97  BUN 5*   < > <5*   < > 3*  --   --  <5*  --   --  <5* 4* <5*  CREATININE  0.79   < > 0.78   < > 0.60  --   --  0.69  --   --  0.55 0.40* 0.50  CALCIUM 9.4  --  9.4  --   --   --   --  8.8*  --   --  8.5*  --  8.0*  MG 1.6*  --   --   --   --   --   --  1.8  --   --   --   --   --   PHOS  --   --   --   --   --   --   --   --  3.8 2.3*  --   --   --    < > = values in this interval not displayed.   GFR: Estimated Creatinine Clearance: 65.4 mL/min (by C-G formula based on SCr of 0.5 mg/dL). Liver Function Tests: Recent Labs  Lab 04/23/20 1339 04/24/20 0605  AST 19 18  ALT 13 11  ALKPHOS 74 64  BILITOT 0.6 0.7  PROT 7.7 6.4*  ALBUMIN 2.4* 1.9*   Recent Labs  Lab 04/23/20 1339  LIPASE 25   No results for input(s): AMMONIA in the last 168 hours. Coagulation Profile: No results for input(s): INR, PROTIME in the last 168 hours. Cardiac Enzymes: No results for input(s): CKTOTAL, CKMB, CKMBINDEX, TROPONINI in the last 168 hours. BNP (last 3 results) No results for input(s): PROBNP in the last 8760 hours. HbA1C: Recent Labs    04/24/20 0605  HGBA1C 6.7*   CBG: Recent Labs  Lab 04/24/20 0550  GLUCAP 109*   Lipid Profile: No results for input(s): CHOL, HDL, LDLCALC, TRIG, CHOLHDL, LDLDIRECT in the last 72 hours. Thyroid Function Tests: No results for input(s): TSH, T4TOTAL, FREET4, T3FREE, THYROIDAB in the last 72 hours. Anemia Panel: No results for input(s): VITAMINB12, FOLATE, FERRITIN, TIBC, IRON, RETICCTPCT in the last 72 hours. Sepsis Labs: No results for input(s): PROCALCITON, LATICACIDVEN in the last 168 hours.  Recent Results (from the past 240 hour(s))  SARS Coronavirus 2 by RT PCR (hospital order, performed in [facility] hospital lab) Nasopharyngeal Nasopharyngeal Swab     Status: None   Collection Time: 04/23/20  6:51 PM   Specimen: Nasopharyngeal Swab  Result Value Ref Range Status   SARS Coronavirus 2 NEGATIVE NEGATIVE Final    Comment: (NOTE) SARS-CoV-2 target nucleic acids are NOT DETECTED.  The SARS-CoV-2 RNA is generally  detectable in upper and lower respiratory specimens during the acute phase of infection. The lowest concentration of SARS-CoV-2 viral copies this assay can detect is 250 copies / mL. A negative result does not preclude SARS-CoV-2 infection and should not be used as the sole basis for treatment or other patient management decisions.  A negative result may occur with improper specimen collection / handling, submission of specimen other than nasopharyngeal swab, presence of viral mutation(s) within the areas targeted by this assay, and inadequate number of viral copies (<250 copies / mL). A negative result must be combined with clinical observations, patient history, and epidemiological information.  Fact Sheet for Patients:   BoilerBrush.com.cy  Fact Sheet for Healthcare Providers: https://pope.com/  This test is not yet approved or  cleared by the [place] FDA and has been authorized for detection and/or diagnosis of SARS-CoV-2 by FDA under an Emergency Use Authorization (EUA).  This EUA will remain in effect (meaning this test can be used) for the duration of the COVID-19 declaration under Section 564(b)(1) of the Act, 21 U.S.C. section 360bbb-3(b)(1), unless the authorization is terminated or revoked sooner.  Performed at [facility], [address], [place], [place] [address]   Blood culture (routine x 2)     Status: Abnormal   Collection Time: 04/23/20  7:30 PM   Specimen: BLOOD  Result Value Ref Range Status   Specimen Description BLOOD RIGHT ANTECUBITAL  Final   Special Requests  Final    BOTTLES DRAWN AEROBIC AND ANAEROBIC Blood Culture adequate volume   Culture  Setup Time   Final    AEROBIC BOTTLE ONLY GRAM POSITIVE COCCI IN CLUSTERS CRITICAL RESULT CALLED TO, READ BACK BY AND VERIFIED WITH: Tori MilksL SEAY PHARMD 04/24/20 2317 JDW Performed at [facility], [address] [place], [place] [address]    Culture  STAPHYLOCOCCUS AUREUS (A)  Final   Report Status 04/26/2020 FINAL  Final   Organism ID, Bacteria STAPHYLOCOCCUS AUREUS  Final      Susceptibility   Staphylococcus aureus - MIC*    CIPROFLOXACIN <=0.5 SENSITIVE Sensitive     ERYTHROMYCIN <=0.25 SENSITIVE Sensitive     GENTAMICIN <=0.5 SENSITIVE Sensitive     OXACILLIN <=0.25 SENSITIVE Sensitive     TETRACYCLINE <=1 SENSITIVE Sensitive     VANCOMYCIN <=0.5 SENSITIVE Sensitive     TRIMETH/SULFA <=10 SENSITIVE Sensitive     CLINDAMYCIN <=0.25 SENSITIVE Sensitive     RIFAMPIN <=0.5 SENSITIVE Sensitive     Inducible Clindamycin NEGATIVE Sensitive     * STAPHYLOCOCCUS AUREUS  Blood Culture ID Panel (Reflexed)     Status: Abnormal   Collection Time: 04/23/20  7:30 PM  Result Value Ref Range Status   Enterococcus species NOT DETECTED NOT DETECTED Final   Listeria monocytogenes NOT DETECTED NOT DETECTED Final   Staphylococcus species DETECTED (A) NOT DETECTED Final    Comment: CRITICAL RESULT CALLED TO, READ BACK BY AND VERIFIED WITH: L SEAY PHARMD 04/24/20 2317 JDW    Staphylococcus aureus (BCID) DETECTED (A) NOT DETECTED Final    Comment: Methicillin (oxacillin) susceptible Staphylococcus aureus (MSSA). Preferred therapy is anti staphylococcal beta lactam antibiotic (Cefazolin or Nafcillin), unless clinically contraindicated. CRITICAL RESULT CALLED TO, READ BACK BY AND VERIFIED WITH: L SEAY PHARMD 04/24/20 2317 JDW    Methicillin resistance NOT DETECTED NOT DETECTED Final   Streptococcus species NOT DETECTED NOT DETECTED Final   Streptococcus agalactiae NOT DETECTED NOT DETECTED Final   Streptococcus pneumoniae NOT DETECTED NOT DETECTED Final   Streptococcus pyogenes NOT DETECTED NOT DETECTED Final   Acinetobacter baumannii NOT DETECTED NOT DETECTED Final   Enterobacteriaceae species NOT DETECTED NOT DETECTED Final   Enterobacter cloacae complex NOT DETECTED NOT DETECTED Final   Escherichia coli NOT DETECTED NOT DETECTED Final    Klebsiella oxytoca NOT DETECTED NOT DETECTED Final   Klebsiella pneumoniae NOT DETECTED NOT DETECTED Final   Proteus species NOT DETECTED NOT DETECTED Final   Serratia marcescens NOT DETECTED NOT DETECTED Final   Haemophilus influenzae NOT DETECTED NOT DETECTED Final   Neisseria meningitidis NOT DETECTED NOT DETECTED Final   Pseudomonas aeruginosa NOT DETECTED NOT DETECTED Final   Candida albicans NOT DETECTED NOT DETECTED Final   Candida glabrata NOT DETECTED NOT DETECTED Final   Candida krusei NOT DETECTED NOT DETECTED Final   Candida parapsilosis NOT DETECTED NOT DETECTED Final   Candida tropicalis NOT DETECTED NOT DETECTED Final    Comment: Performed at [facility], [address] [place], [place] [address]  Blood culture (routine x 2)     Status: None (Preliminary result)   Collection Time: 04/23/20  7:35 PM   Specimen: BLOOD RIGHT FOREARM  Result Value Ref Range Status   Specimen Description BLOOD RIGHT FOREARM  Final   Special Requests   Final    BOTTLES DRAWN AEROBIC AND ANAEROBIC Blood Culture results may not be optimal due to an inadequate volume of blood received in culture bottles   Culture  Final    NO GROWTH 3 DAYS Performed at [facility], [place] [address] [place], [place] [address]    Report Status PENDING  Incomplete  Aerobic/Anaerobic Culture (surgical/deep wound)     Status: None (Preliminary result)   Collection Time: 04/23/20 10:37 PM   Specimen: Abscess  Result Value Ref Range Status   Specimen Description ABSCESS  Final   Special Requests THORACOULMBAR  Final   Gram Stain   Final    FEW WBC PRESENT, PREDOMINANTLY PMN FEW GRAM POSITIVE COCCI CRITICAL RESULT CALLED TO, READ BACK BY AND VERIFIED WITH: RN T ROWE @0107  04/24/20 BY S GEZAHEGN Performed at [facility], [place] [address], [place], [place] [address]    Culture   Final    FEW STAPHYLOCOCCUS AUREUS NO ANAEROBES ISOLATED; CULTURE IN PROGRESS FOR 5 DAYS    Report Status PENDING   Incomplete   Organism ID, Bacteria STAPHYLOCOCCUS AUREUS  Final      Susceptibility   Staphylococcus aureus - MIC*    CIPROFLOXACIN <=0.5 SENSITIVE Sensitive     ERYTHROMYCIN <=0.25 SENSITIVE Sensitive     GENTAMICIN <=0.5 SENSITIVE Sensitive     OXACILLIN <=0.25 SENSITIVE Sensitive     TETRACYCLINE <=1 SENSITIVE Sensitive     VANCOMYCIN <=0.5 SENSITIVE Sensitive     TRIMETH/SULFA <=10 SENSITIVE Sensitive     CLINDAMYCIN <=0.25 SENSITIVE Sensitive     RIFAMPIN <=0.5 SENSITIVE Sensitive     Inducible Clindamycin NEGATIVE Sensitive     * FEW STAPHYLOCOCCUS AUREUS  Aerobic/Anaerobic Culture (surgical/deep wound)     Status: None (Preliminary result)   Collection Time: 04/23/20 10:37 PM   Specimen: Soft Tissue, Other  Result Value Ref Range Status   Specimen Description ABSCESS  Final   Special Requests EPIDURAL  Final   Gram Stain   Final    NO WBC SEEN NO ORGANISMS SEEN Performed at [facility], [address] [place], [place] [address]    Culture   Final    FEW STAPHYLOCOCCUS AUREUS NO ANAEROBES ISOLATED; CULTURE IN PROGRESS FOR 5 DAYS    Report Status PENDING  Incomplete   Organism ID, Bacteria STAPHYLOCOCCUS AUREUS  Final      Susceptibility   Staphylococcus aureus - MIC*    CIPROFLOXACIN <=0.5 SENSITIVE Sensitive     ERYTHROMYCIN <=0.25 SENSITIVE Sensitive     GENTAMICIN <=0.5 SENSITIVE Sensitive     OXACILLIN <=0.25 SENSITIVE Sensitive     TETRACYCLINE <=1 SENSITIVE Sensitive     VANCOMYCIN <=0.5 SENSITIVE Sensitive     TRIMETH/SULFA <=10 SENSITIVE Sensitive     CLINDAMYCIN <=0.25 SENSITIVE Sensitive     RIFAMPIN <=0.5 SENSITIVE Sensitive     Inducible Clindamycin NEGATIVE Sensitive     * FEW STAPHYLOCOCCUS AUREUS  MRSA PCR Screening     Status: None   Collection Time: 04/24/20  1:50 AM   Specimen: Nasal Mucosa; Nasopharyngeal  Result Value Ref Range Status   MRSA by PCR NEGATIVE NEGATIVE Final    Comment:        The GeneXpert MRSA Assay (FDA approved  for NASAL specimens only), is one component of a comprehensive MRSA colonization surveillance program. It is not intended to diagnose MRSA infection nor to guide or monitor treatment for MRSA infections. Performed at [facility], [place] [address] [place], [place] [address]   Culture, blood (routine x 2)     Status: None (Preliminary result)   Collection Time: 04/24/20  8:20 AM  Specimen: BLOOD LEFT ARM  Result Value Ref Range Status   Specimen Description BLOOD LEFT ARM  Final   Special Requests   Final    BOTTLES DRAWN AEROBIC ONLY Blood Culture adequate volume   Culture   Final    NO GROWTH 2 DAYS Performed at [facility], [address], [place], [place] [address]    Report Status PENDING  Incomplete  Culture, blood (routine x 2)     Status: None (Preliminary result)   Collection Time: 04/24/20  8:20 AM   Specimen: BLOOD LEFT ARM  Result Value Ref Range Status   Specimen Description BLOOD LEFT ARM  Final   Special Requests   Final    BOTTLES DRAWN AEROBIC ONLY Blood Culture results may not be optimal due to an inadequate volume of blood received in culture bottles   Culture   Final    NO GROWTH 2 DAYS Performed at [facility], [address] [place], [place] [address]    Report Status PENDING  Incomplete  Culture, blood (routine x 2)     Status: None (Preliminary result)   Collection Time: 04/25/20  7:08 AM   Specimen: BLOOD  Result Value Ref Range Status   Specimen Description BLOOD RIGHT ANTECUBITAL  Final   Special Requests   Final    BOTTLES DRAWN AEROBIC AND ANAEROBIC Blood Culture adequate volume   Culture   Final    NO GROWTH 1 DAY Performed at [facility], [address] [place], [place] [address]    Report Status PENDING  Incomplete  Culture, blood (routine x 2)     Status: None (Preliminary result)   Collection Time: 04/25/20  7:14 AM   Specimen: BLOOD  Result Value Ref Range Status   Specimen Description BLOOD LEFT  ANTECUBITAL  Final   Special Requests   Final    BOTTLES DRAWN AEROBIC AND ANAEROBIC Blood Culture adequate volume   Culture   Final    NO GROWTH 1 DAY Performed at [facility], [address] [place], [place] [address]    Report Status PENDING  Incomplete  Surgical PCR screen     Status: None   Collection Time: 04/25/20  7:54 AM   Specimen: Nasal Mucosa; Nasal Swab  Result Value Ref Range Status   MRSA, PCR NEGATIVE NEGATIVE Final   Staphylococcus aureus NEGATIVE NEGATIVE Final    Comment: (NOTE) The Xpert SA Assay (FDA approved for NASAL specimens in patients 78 years of age and older), is one component of a comprehensive surveillance program. It is not intended to diagnose infection nor to guide or monitor treatment. Performed at [facility], [address], [place], [place] [address]   Aerobic/Anaerobic Culture (surgical/deep wound)     Status: None (Preliminary result)   Collection Time: 04/25/20  4:44 PM   Specimen: Wound  Result Value Ref Range Status   Specimen Description WOUND  Final   Special Requests RT ACROMIOCLAVICULAR JOINT  Final   Gram Stain   Final    FEW WBC PRESENT, PREDOMINANTLY PMN NO ORGANISMS SEEN    Culture   Final    RARE STAPHYLOCOCCUS AUREUS CULTURE REINCUBATED FOR BETTER GROWTH Performed at [facility], [address] [place], [place] [address]    Report Status PENDING  Incomplete     Radiology Studies: No results found.  Scheduled Meds: . amLODipine  5 mg Oral Daily  . Chlorhexidine Gluconate Cloth  6 each Topical Daily  . docusate sodium  100 mg Oral BID  . lisinopril  5 mg Oral Daily  . oxyCODONE  15 mg Oral Q12H  . pantoprazole (PROTONIX) IV  40 mg Intravenous QHS  . potassium chloride  60 mEq Oral BID  . sodium chloride flush  3 mL Intravenous Q12H   Continuous Infusions: . sodium chloride    . sodium chloride    . 0.9 % NaCl with KCl 20 mEq / L 80 mL/hr at 04/26/20 1124  .  ceFAZolin (ANCEF) IV 2 g  (04/26/20 1128)     LOS: 3 days   Rickey Barbara, MD [facility] Pager On Amion  If 7PM-7AM, please contact night-coverage 04/26/2020, 3:49 PM

## 2020-04-26 NOTE — Progress Notes (Signed)
   04/26/20 1011  Assess: MEWS Score  BP (!) 170/91   Pt remains with elevated BP, paged hospitalist per Franky Macho, MD for BP management.

## 2020-04-26 NOTE — Progress Notes (Signed)
Patient ID: Debra Schmidt, female   DOB: [birth year], 60 y.o.   MRN: [id number] BP (!) 161/95 (BP Location: Right Arm)   Pulse 92   Temp 97.9 F (36.6 C) (Oral)   Resp (!) 36   Ht 5\' 4"  (1.626 m)   Wt 62.6 kg   SpO2 95%   BMI 23.69 kg/m  Alert and oriented x 4, moving all extremities Wound is clean, no signs of infection Remains hypokalemic,standing dose of 60 meq bid Continue abx, will need a six week course

## 2020-04-26 NOTE — Progress Notes (Signed)
   04/26/20 0740  Assess: MEWS Score  Temp 98 F (36.7 C)  BP (!) 176/98  Pulse Rate 99  ECG Heart Rate 100  Resp (!) 26  Level of Consciousness Alert  SpO2 95 %  O2 Device Room Air  Assess: MEWS Score  MEWS Temp 0  MEWS Systolic 0  MEWS Pulse 0  MEWS RR 2  MEWS LOC 0  MEWS Score 2  MEWS Score Color Yellow  Assess: if the MEWS score is Yellow or Red  Were vital signs taken at a resting state? Yes  Focused Assessment Documented focused assessment  Early Detection of Sepsis Score *See Row Information* Low  MEWS guidelines implemented *See Row Information* Yes  Treat  MEWS Interventions Administered prn meds/treatments  Take Vital Signs  Increase Vital Sign Frequency  Yellow: Q 2hr X 2 then Q 4hr X 2, if remains yellow, continue Q 4hrs  Escalate  MEWS: Escalate Yellow: discuss with charge nurse/RN and consider discussing with provider and RRT  Notify: Charge Nurse/RN  Name of Charge Nurse/RN Associate Professor, RN  Date Charge Nurse/RN Notified 04/16/20  Time Charge Nurse/RN Notified 0741  Notify: Provider  Provider Name/Title NRS service  Date Provider Notified 04/26/20  Time Provider Notified [phone]  Notification Type Call  Notification Reason Other (Comment) (elevated BP)   Pt c/c is pain, however she has been hypertensive yesterday and early this morning.PRN pain medication administered. Will continue to monitor.

## 2020-04-26 NOTE — PMR Pre-admission (Signed)
PMR Admission Coordinator Pre-Admission Assessment  Patient: Debra Schmidt is an 59 y.o., female MRN: [id number] DOB: Mar 19, 1961 Height:  (162.6 cm) Weight: 62.6 kg              Insurance Information HMO:     PPO:      PCP:      IPA:      80/20:      OTHER:  PRIMARY: Uninsured (self pay)      Policy#:       Subscriber:  CM Name:       Phone#:      Fax#:  Pre-Cert#:       Employer:  Benefits:  Phone #:      Name:  Eff. Date:      Deduct:       Out of Pocket Max:       Life Max:   CIR: Pt/family are aware of estimated daily cost of care ($3,500). [facility] is following.       SNF:  Outpatient:      Co-Pay:  Home Health:       Co-Pay:  DME:      Co-Pay:  Providers:  SECONDARY: None      Policy#:       Phone#:   Financial Counselor: Clement Husbands      Phone#: [phone] (has been following, spoke to husband)   The "Data Collection Information Summary" for patients in Inpatient Rehabilitation Facilities with attached "Privacy Act Statement-Health Care Records" was provided and verbally reviewed with: N/A  Emergency Contact Information Contact Information    Name Relation Home Work Mobile   [name] [phone]  [phone]     Current Medical History  Patient Admitting Diagnosis: Severe thoraco-lumbar and right shoulder infections secondary to MSSA bacteremia  History of Present Illness:  Debra Schmidt is a 59 y.o. female with history of tobacco use and back pain with progression x72 hours with BLE weakness, constipation and bladder incontinence who was admitted via ED on 04/23/2020.  Potassium 2.0 at admission with elevated BNP and urinary retention with return of 1000 cc urine with Foley placement.  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 Mikal Plane.   Hypomagnesemia/hypokalemia aggressively supplemented.  UDS negative and patient with distant history of IVDU.  She reported pain right shoulder and was noted to have purulent drainage. CT shoulder done and revealed showed soft tissue ulceration on top of shoulder with underlying AC septic arthritis and osteomyelitis of the acromion and distal clavicle with small amount of fluid in subacromial/subdeltoid bursa.  Dr. Orvan Falconer consulted for input on antibiotic regimen and recommended vancomycin alone pending final culture results.  Dr. Eulah Pont consulted and plans for patient to undergo I&D distal clavicle/acromion. MSSA infection confirmed and pt antibiotics changed to cefazolin for at least 6 weeks. On 6/15 pt underwent irrigaiton, debridement, and resection of bone of right AC join by Dr. Eulah Pont. On 6/19, pt with tachycardia and tachypnea which pt reports due to pain. CXR negative and CTA of  chest r/o PE but did incidentally show findings consistent with acute pancreatitis. GI consulted and pt made NPO; MRCP revealed gallstones but no CBD stones. GI recommended follow up with GI as outpatient to discuss elective lap chole. Able to tolerate slow progression of diet and pancreatitis improving. Therapy evaluations completed revealing deficits in mobility and ADLS. CIR recommended due to functional deficits. Pt is to admit to CIR on 05/09/20.    Glasgow Coma Scale Score: 15  Past Medical History  History reviewed. No pertinent past medical history.  Family History  family history includes Cancer in her father and mother.  Prior Rehab/Hospitalizations:  Has  the patient had prior rehab or hospitalizations prior to admission? No  Has the patient had major surgery during 100 days prior to admission? Yes  Current Medications   Current Facility-Administered Medications:  .  0.9 %  sodium chloride infusion, 250 mL, Intravenous, Continuous, Martensen, Charna Elizabeth III, PA-C .  0.9 %  sodium chloride infusion, , Intravenous, Continuous, Martensen, Charna Elizabeth III, PA-C .  acetaminophen (TYLENOL) tablet 650 mg, 650 mg, Oral, Q4H PRN, 650 mg at 04/27/20 0800 **OR** acetaminophen (TYLENOL) suppository 650 mg, 650 mg, Rectal, Q4H PRN, Martensen, Charna Elizabeth III, PA-C .  amLODipine (NORVASC) tablet 5 mg, 5 mg, Oral, Daily, Martensen, Charna Elizabeth III, PA-C, 5 mg at 05/09/20 0913 .  bisacodyl (DULCOLAX) EC tablet 5 mg, 5 mg, Oral, Daily PRN, Prudencio Burly III, PA-C, 5 mg at 04/24/20 0958 .  ceFAZolin (ANCEF) IVPB 2g/100 mL premix, 2 g, Intravenous, Q8H, Susa Raring, RPH, Last Rate: 200 mL/hr at 05/09/20 1126, 2 g at 05/09/20 1126 .  Chlorhexidine Gluconate Cloth 2 % PADS 6 each, 6 each, Topical, Daily, Prudencio Burly III, PA-C, 6 each at 05/09/20 [phone] .  diazepam (VALIUM) tablet 5 mg, 5 mg, Oral, Q6H PRN, Prudencio Burly III, PA-C, 5 mg at 05/09/20 0601 .  docusate sodium (COLACE) capsule 100 mg, 100 mg, Oral, BID, Prudencio Burly III, PA-C, 100 mg at 05/09/20 0913 .  hydrALAZINE (APRESOLINE) injection 5-10 mg, 5-10 mg, Intravenous, Q1H PRN, Meyran, Ocie Cornfield, NP, 5 mg at 04/27/20 0419 .  lactated ringers infusion, , Intravenous, Continuous, Donne Hazel, MD, Last Rate: 100 mL/hr at 05/08/20 2147, New Bag at 05/08/20 2147 .  lisinopril (ZESTRIL) tablet 10 mg, 10 mg, Oral, Daily, Donne Hazel, MD, 10 mg at 05/09/20 0913 .  magnesium citrate solution 1 Bottle, 1 Bottle, Oral, Once PRN, Martensen, Charna Elizabeth III, PA-C .  magnesium oxide (MAG-OX) tablet 200 mg, 200 mg, Oral, BID, Pahwani, Ravi, MD, 200 mg at  05/09/20 0912 .  menthol-cetylpyridinium (CEPACOL) lozenge 3 mg, 1 lozenge, Oral, PRN **OR** phenol (CHLORASEPTIC) mouth spray 1 spray, 1 spray, Mouth/Throat, PRN, Martensen, Charna Elizabeth III, PA-C .  methocarbamol (ROBAXIN) 500 mg in dextrose 5 % 50 mL IVPB, 500 mg, Intravenous, Q6H PRN, Last Rate: 100 mL/hr at 04/28/20 0349, 500 mg at 04/28/20 0349 **OR** methocarbamol (ROBAXIN) tablet 500 mg, 500 mg, Oral, Q6H PRN, Meyran, Ocie Cornfield, NP, 500 mg at 05/09/20 0912 .  metoprolol tartrate (LOPRESSOR) injection 5 mg, 5 mg, Intravenous, Q5 min PRN, Donne Hazel, MD .  morphine 2 MG/ML injection 2 mg, 2 mg, Intravenous, Q2H PRN, Prudencio Burly III, PA-C, 2 mg at 04/28/20 0956 .  multivitamin with minerals tablet 1 tablet, 1 tablet, Oral, Daily, Ashok Pall, MD, 1 tablet at 05/09/20 0913 .  nutrition supplement (JUVEN) (JUVEN) powder packet 1  packet, 1 packet, Oral, BID BM, Coletta Memos, MD, 1 packet at 05/09/20 0912 .  ondansetron (ZOFRAN) tablet 4 mg, 4 mg, Oral, Q6H PRN **OR** ondansetron (ZOFRAN) injection 4 mg, 4 mg, Intravenous, Q6H PRN, Martensen, Lucretia Kern III, PA-C .  oxyCODONE (Oxy IR/ROXICODONE) immediate release tablet 10 mg, 10 mg, Oral, Q3H PRN, Albina Billet III, PA-C, 10 mg at 05/08/20 1251 .  oxyCODONE (Oxy IR/ROXICODONE) immediate release tablet 5 mg, 5 mg, Oral, Q3H PRN, Albina Billet III, PA-C, 5 mg at 05/06/20 0520 .  oxyCODONE (OXYCONTIN) 12 hr tablet 15 mg, 15 mg, Oral, Q12H, Martensen, Lucretia Kern III, PA-C, 15 mg at 05/09/20 0913 .  pantoprazole (PROTONIX) EC tablet 40 mg, 40 mg, Oral, QHS, Coletta Memos, MD, 40 mg at 05/08/20 2102 .  phenazopyridine (PYRIDIUM) tablet 100 mg, 100 mg, Oral, TID PRN, Albina Billet III, PA-C, 100 mg at 04/25/20 2134 .  potassium chloride SA (KLOR-CON) CR tablet 40 mEq, 40 mEq, Oral, Daily, Pahwani, Ravi, MD, 40 mEq at 05/09/20 0912 .  potassium chloride SA (KLOR-CON) CR tablet 40 mEq, 40 mEq, Oral,  Once, Pahwani, Ravi, MD .  senna-docusate (Senokot-S) tablet 1 tablet, 1 tablet, Oral, QHS PRN, Albina Billet III, PA-C, 1 tablet at 05/02/20 [phone] .  sodium chloride flush (NS) 0.9 % injection 10-40 mL, 10-40 mL, Intracatheter, PRN, Martensen, Lucretia Kern III, PA-C .  sodium chloride flush (NS) 0.9 % injection 10-40 mL, 10-40 mL, Intracatheter, Q12H, Coletta Memos, MD, 10 mL at 05/09/20 0918 .  sodium chloride flush (NS) 0.9 % injection 10-40 mL, 10-40 mL, Intracatheter, PRN, Coletta Memos, MD .  sodium chloride flush (NS) 0.9 % injection 3 mL, 3 mL, Intravenous, Q12H, Martensen, Lucretia Kern III, PA-C, 3 mL at 05/09/20 0918 .  sodium chloride flush (NS) 0.9 % injection 3 mL, 3 mL, Intravenous, PRN, Albina Billet III, PA-C, 3 mL at 05/03/20 0946 .  tamsulosin (FLOMAX) capsule 0.4 mg, 0.4 mg, Oral, QPC breakfast, Pahwani, Ravi, MD, 0.4 mg at 05/09/20 0913 .  zolpidem (AMBIEN) tablet 5 mg, 5 mg, Oral, QHS PRN, Albina Billet III, PA-C, 5 mg at 05/04/20 2146  Patients Current Diet:  Diet Order            Diet Heart Room service appropriate? Yes; Fluid consistency: Thin  Diet effective now                 Precautions / Restrictions Precautions Precautions: Fall, Back Precaution Booklet Issued: Yes (comment) Precaution Comments: pt able to recall 2/3, pt re-educated; "let me see the piece of paper" (back handout)--but once I gave it to her she did not look at it to see what her back precautions were, she just kept trying to remember them Spinal Brace: Lumbar corset, Applied in sitting position Restrictions Weight Bearing Restrictions: Yes RUE Weight Bearing: Weight bearing as tolerated RUE Partial Weight Bearing Percentage or Pounds:  (Per ortho, nothing overhead. ) Other Position/Activity Restrictions: no overhead movement RUE   Has the patient had 2 or more falls or a fall with injury in the past year?Yes  Prior Activity Level Community (5-7x/wk): very  active until onset of infection; not working but does drive. Independent without an AD PTA.   Prior Functional Level Prior Function Level of Independence: Needs assistance Gait / Transfers Assistance Needed: per pt, she was ambulating with rollator for the past two weeks, fell and scraped her R shoulder.   Self Care: Did the patient need  help bathing, dressing, using the toilet or eating?  Independent  Indoor Mobility: Did the patient need assistance with walking from room to room (with or without device)? Independent  Stairs: Did the patient need assistance with internal or external stairs (with or without device)? Independent  Functional Cognition: Did the patient need help planning regular tasks such as shopping or remembering to take medications? Independent  Home Assistive Devices / Equipment Home Assistive Devices/Equipment: Cane (specify quad or straight) Home Equipment: Walker - 4 wheels  Prior Device Use: Indicate devices/aids used by the patient prior to current illness, exacerbation or injury? None of the above  Current Functional Level Cognition  Overall Cognitive Status: No family/caregiver present to determine baseline cognitive functioning Current Attention Level: Sustained Orientation Level: Oriented X4 Following Commands: Follows multi-step commands inconsistently Safety/Judgement: Decreased awareness of safety, Decreased awareness of deficits General Comments: could state 2/3 precautions but not following them    Extremity Assessment (includes Sensation/Coordination)  Upper Extremity Assessment: Generalized weakness, RUE deficits/detail RUE Deficits / Details: recent AC joint debriedment  Lower Extremity Assessment: Defer to PT evaluation RLE Deficits / Details: bil LE weakness L>R when assessed at bed level.  unable to lift either leg against gravity (R weakly, but not through full ROM, L unable to clear back of thigh off of bed).   RLE Sensation:  (per pt  report normal and equal bil) LLE Deficits / Details: bil LE weakness L>R when assessed at bed level.  unable to lift either leg against gravity (R weakly, but not through full ROM, L unable to clear back of thigh off of bed).   LLE Sensation:  (per pt report WNL and equal bil)    ADLs  Overall ADL's : Needs assistance/impaired Eating/Feeding: Set up, Sitting Grooming: Set up, Sitting Upper Body Bathing: Minimal assistance, Sitting Lower Body Bathing: Moderate assistance, Maximal assistance, Sitting/lateral leans, Sit to/from stand Upper Body Dressing : Maximal assistance, Sitting Upper Body Dressing Details (indicate cue type and reason): to don brace EOB Lower Body Dressing: Total assistance, Bed level Lower Body Dressing Details (indicate cue type and reason): to don new sock Toilet Transfer: +2 for physical assistance, +2 for safety/equipment, Ambulation, Moderate assistance Toilet Transfer Details (indicate cue type and reason): simulated via functional mobility; Mod A +2 for functional mobility with pt needing max cues to sequence BOS and initiate pivotal steps, pt greatly limited by pain in low back. Unable to advance RW and sequence to date Toileting- Architect and Hygiene: Moderate assistance, Sitting/lateral lean, Sit to/from stand Toileting - Clothing Manipulation Details (indicate cue type and reason): pt also has difficulty with urinary retention limiting BADL participation unless regularly catheterized Functional mobility during ADLs: +2 for safety/equipment, +2 for physical assistance, Cueing for sequencing, Cueing for safety, Moderate assistance General ADL Comments: Educated pt's husband and son on bed mobility, transfers, use of gait belt, back precautions, how they will need to A her with bathing, dressing, toileting, how to doff/donn brace--had family return demonstrate all but ADLs. Also made them aware of using the 3n1 as a shower seat    Mobility  Overal bed  mobility: Needs Assistance Bed Mobility: Rolling, Sit to Sidelying Rolling: Min assist Sidelying to sit: Min assist Sit to sidelying: Mod assist General bed mobility comments: VCs for sequencing    Transfers  Overall transfer level: Needs assistance Equipment used: Rolling walker (2 wheeled) Transfers: Sit to/from Stand, Stand Pivot Transfers Sit to Stand: Min assist Stand pivot transfers: Mod assist General transfer  comment: max verbal cues to push up from chair not pull up on walker, increased time, modA to steady during transition of hands from chair to walker    Ambulation / Gait / Stairs / Wheelchair Mobility  Ambulation/Gait Ambulation/Gait assistance: Mod assist, +2 safety/equipment (chair follow) Gait Distance (Feet): 15 Feet Assistive device: Rolling walker (2 wheeled) Gait Pattern/deviations: Step-to pattern, Decreased stride length, Decreased stance time - left, Decreased step length - left General Gait Details: pt with short step length and decreased foot clearance on L foot, very dependent on RW. Pt with labored effort Gait velocity: dec Gait velocity interpretation: <1.8 ft/sec, indicate of risk for recurrent falls Stairs: Yes Stairs assistance: +2 physical assistance, Max assist Stair Management: One rail Left, Step to pattern, Sideways, Forwards Number of Stairs: 2 General stair comments: attempted to ascend stairs sideways leading with R UE (stronger foot) however pt unable to complete due to unable to push up enough to clear L foot without maximal assist. Then attempted to ascend forward with L handrail and PT providing HHA on R to mimic other hands rail. Pt required maxA to achieve R knee extension and then modA to bring L foot up on step. Pt required to sit on PTs knee due to pain and weakness. pt assisted to chair with maxAx2, called spouse and he said "my son and I will just carry her in"    Posture / Balance Dynamic Sitting Balance Sitting balance - Comments:  Using Bil UEs to support self EOB (to help with pain) Balance Overall balance assessment: Needs assistance Sitting-balance support: Bilateral upper extremity supported, Feet supported Sitting balance-Leahy Scale: Poor Sitting balance - Comments: Using Bil UEs to support self EOB (to help with pain) Postural control: Right lateral lean Standing balance support: Bilateral upper extremity supported, During functional activity Standing balance-Leahy Scale: Poor Standing balance comment: dependent on RW and external support    Special needs/care consideration Continuous Drip IV: cefazolin, lactated ringers infusion, Skin: ecchymosis to arm; back (right; left), surgical incision to mid back and right shoulder  Behavioral consideration: restless  Special service needs: will need to be on IV antibiotics at home-Pam [place] with [facility] familiar with case.    and Designated visitor: Bethann Berkshire (husband).      Previous Home Environment (from acute therapy documentation) Living Arrangements: Spouse/significant other Available Help at Discharge: Family Type of Home: House Home Layout: One level Home Access: Stairs to enter Entrance Stairs-Rails: Right, Left, Can reach both Entrance Stairs-Number of Steps: 2 Bathroom Shower/Tub: Tub/shower unit, Health visitor: Standard Home Care Services: No  Discharge Living Setting Plans for Discharge Living Setting: Patient's home, Lives with (comment) (lives with husband and 71 yo son. ) Type of Home at Discharge: House Discharge Home Layout: One level Discharge Home Access: Stairs to enter Entrance Stairs-Rails: Can reach both Entrance Stairs-Number of Steps: 2 Discharge Bathroom Shower/Tub: Tub only, Walk-in shower Discharge Bathroom Toilet: Standard Discharge Bathroom Accessibility: Yes How Accessible: Accessible via walker Does the patient have any problems obtaining your medications?: No  Social/Family/Support  Systems Patient Roles: Spouse Contact Information: husband: Bethann Berkshire [phone] Anticipated Caregiver: husband (when home from work) + 72 yo son  Anticipated Industrial/product designer Information: see above for details Ability/Limitations of Caregiver: Min A Caregiver Availability: 24/7 Discharge Plan Discussed with Primary Caregiver: Yes (pt and her husband) Is Caregiver In Agreement with Plan?: Yes Does Caregiver/Family have Issues with Lodging/Transportation while Pt is in Rehab?: No   Goals Patient/Family Goal for Rehab: PT/OT/SLP:  Min A Expected length of stay: 12-16 days Pt/Family Agrees to Admission and willing to participate: Yes Program Orientation Provided & Reviewed with Pt/Caregiver Including Roles  & Responsibilities: Yes (with pt and her husband)  Barriers to Discharge: Home environment access/layout, IV antibiotics, Insurance for SNF coverage, Weight bearing restrictions  Barriers to Discharge Comments: steps to enter home; IV antibiotics at DC, uninsured.    Decrease burden of Care through IP rehab admission: NA   Possible need for SNF placement upon discharge:Not anticipated. Pt has good family support at DC and family can provide the anticipated assist level (Min A). They understand the plan is to DC home after CIR stay. As pt is uninsured, she would be a difficult to place patient if unable to transition home. Family aware they will have to pay for her continued antibiotics and RN care at DC.    Patient Condition: This patient's medical and functional status has changed since the consult dated: 04/25/20 in which the Rehabilitation Physician determined and documented that the patient's condition is appropriate for intensive rehabilitative care in an inpatient rehabilitation facility. See "History of Present Illness" (above) for medical update. Functional changes are: progression in transfer status from Mod A +2 to Mod A, initiation of gait of up to 15 feet with Mod A +2, and OT  has completed evaluation revealing CIR needs. Patient's medical and functional status update has been discussed with the Rehabilitation physician and patient remains appropriate for inpatient rehabilitation. Will admit to inpatient rehab today.  Preadmission Screen Completed By:  Cheri RousKelly Magaby Rumberger, OT, 05/09/2020 11:42 AM ______________________________________________________________________   Discussed status with Dr. Berline ChoughLovorn on 05/09/20 at 11:40AM and received approval for admission today.  Admission Coordinator:  Cheri RousKelly Kaedin Hicklin, time 11:40AM Dorna Bloom/Date 05/09/20.

## 2020-04-26 NOTE — Anesthesia Postprocedure Evaluation (Signed)
Anesthesia Post Note  Patient: Debra Schmidt  Procedure(s) Performed: Open debridement R AC joint; Distal clavicle repair (Right ) ACROMIO-CLAVICULAR JOINT REPAIR (Right Shoulder) RESECTION DISTAL CLAVICAL (Right )     Patient location during evaluation: Other Anesthesia Type: General Level of consciousness: awake and alert Pain management: pain level controlled Vital Signs Assessment: post-procedure vital signs reviewed and stable Respiratory status: spontaneous breathing, nonlabored ventilation and respiratory function stable Cardiovascular status: blood pressure returned to baseline and stable Postop Assessment: no apparent nausea or vomiting Anesthetic complications: no   No complications documented.  Last Vitals:  Vitals:   04/26/20 0736 04/26/20 0740  BP: (!) 171/135 (!) 176/98  Pulse: 98 99  Resp: (!) 28 (!) 26  Temp: 36.7 C 36.7 C  SpO2: 96% 95%    Last Pain:  Vitals:   04/26/20 0558  TempSrc:   PainSc: 6                  Abhi Moccia,W. EDMOND

## 2020-04-26 NOTE — Progress Notes (Addendum)
CRITICAL VALUE ALERT  Critical Value:  Potassium = 2.5  Date & Time Notied:  04/26/2020 @0723  by  Provider Notified: page NRS service and left message  Orders Received/Actions taken: None at this time  Update: 0801 PO potassium 60 mEq supplement ordered

## 2020-04-26 NOTE — Progress Notes (Signed)
   04/26/20 1611  Urine Characteristics  Urinary Interventions Intermittent/Straight cath  Intermittent/Straight Cath (mL) 1200 mL  Hygiene Peri care   Pt voiced pressure to lower abdominal area "feels like I'm about to explode." She is unable to void, bladder scan revealed greater than 999. 2nd in and out cath performed per protocol with results shown above. Will continue to monitor.

## 2020-04-26 NOTE — Progress Notes (Signed)
Patient ID: Debra Schmidt, female   DOB: [birth year], 60 y.o.   MRN: [id number]         [facility] for Infectious Disease  Date of Admission:  04/23/2020   Total days of antibiotics 4        Day 2 cefazolin         ASSESSMENT: She has widely disseminated MSSA infection with bacteremia, spine infection and right shoulder osteomyelitis.  Repeat blood cultures are negative at 48 hours.  If they remain negative tomorrow will order PICC placement in anticipation of a minimum of 6 weeks of IV cefazolin.   PLAN: 1. Continue cefazolin 2. 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 amLODipine  5 mg Oral Daily  . Chlorhexidine Gluconate Cloth  6 each Topical Daily  . docusate sodium  100 mg Oral BID  . oxyCODONE  15 mg Oral Q12H  . pantoprazole (PROTONIX) IV  40 mg Intravenous QHS  . potassium chloride  60 mEq Oral BID  . sodium chloride flush  3 mL Intravenous Q12H   Continuous Infusions: . sodium chloride    . sodium chloride    . 0.9 % NaCl with KCl 20 mEq / L 80 mL/hr at 04/26/20 0055  .  ceFAZolin (ANCEF) IV 2 g (04/26/20 0353)   PRN Meds:.acetaminophen **OR** acetaminophen, bisacodyl, diazepam, magnesium citrate, menthol-cetylpyridinium **OR** phenol, morphine injection, ondansetron **OR** ondansetron (ZOFRAN) IV, oxyCODONE, oxyCODONE, phenazopyridine, senna-docusate, sodium chloride flush, sodium chloride flush, zolpidem   SUBJECTIVE: She is feeling better.  She is still having back pain but says that she is not having any shoulder pain.  Review of Systems: Review of Systems  Constitutional: Negative for chills, diaphoresis and fever.  Respiratory: Negative for cough and shortness of breath.     Cardiovascular: Negative for chest pain.  Musculoskeletal: Positive for back pain. Negative for joint pain.    Allergies  Allergen Reactions  . Penicillins Hives    Tolerated Zosyn 04/2020    OBJECTIVE: Vitals:   04/26/20 0434 04/26/20 0736 04/26/20 0740 04/26/20 1011  BP:  (!) 171/135 (!) 176/98 (!) 170/91  Pulse:  98 99 91  Resp:  (!) 28 (!) 26 (!) 21  Temp: 98 F (36.7 C) 98 F (36.7 C) 98 F (36.7 C) 98.5 F (36.9 C)  TempSrc: Oral   Oral  SpO2:  96% 95% 98%  Weight:      Height:       Body mass index is 23.69 kg/m.  Physical Exam Constitutional:      Comments: She is more alert and appears more comfortable today.  Cardiovascular:     Rate and Rhythm: Normal rate and regular rhythm.     Heart sounds: No murmur heard.   Pulmonary:     Effort: Pulmonary effort is normal.     Breath sounds: Normal breath sounds.  Psychiatric:        Mood and Affect: Mood normal.     Lab Results Lab Results  Component Value Date   WBC 16.2 (H) 04/26/2020   HGB 8.9 (L) 04/26/2020   HCT 27.5 (L) 04/26/2020   MCV 83.6 04/26/2020   PLT 379 04/26/2020    Lab Results  Component  Value Date   CREATININE 0.50 04/26/2020   BUN <5 (L) 04/26/2020   NA 136 04/26/2020   K 2.5 (LL) 04/26/2020   CL 97 (L) 04/26/2020   CO2 29 04/26/2020    Lab Results  Component Value Date   ALT 11 04/24/2020   AST 18 04/24/2020   ALKPHOS 64 04/24/2020   BILITOT 0.7 04/24/2020     Microbiology: Recent Results (from the past 240 hour(s))  SARS Coronavirus 2 by RT PCR (hospital order, performed in [place] hospital lab) Nasopharyngeal Nasopharyngeal Swab     Status: None   Collection Time: 04/23/20  6:51 PM   Specimen: Nasopharyngeal Swab  Result Value Ref Range Status   SARS Coronavirus 2 NEGATIVE NEGATIVE Final    Comment: (NOTE) SARS-CoV-2 target nucleic acids are NOT DETECTED.  The SARS-CoV-2 RNA is generally detectable in upper and lower respiratory specimens during the acute  phase of infection. The lowest concentration of SARS-CoV-2 viral copies this assay can detect is 250 copies / mL. A negative result does not preclude SARS-CoV-2 infection and should not be used as the sole basis for treatment or other patient management decisions.  A negative result may occur with improper specimen collection / handling, submission of specimen other than nasopharyngeal swab, presence of viral mutation(s) within the areas targeted by this assay, and inadequate number of viral copies (<250 copies / mL). A negative result must be combined with clinical observations, patient history, and epidemiological information.  Fact Sheet for Patients:   StrictlyIdeas.no  Fact Sheet for Healthcare Providers: BankingDealers.co.za  This test is not yet approved or  cleared by the [place] FDA and has been authorized for detection and/or diagnosis of SARS-CoV-2 by FDA under an Emergency Use Authorization (EUA).  This EUA will remain in effect (meaning this test can be used) for the duration of the COVID-19 declaration under Section 564(b)(1) of the Act, 21 U.S.C. section 360bbb-3(b)(1), unless the authorization is terminated or revoked sooner.  Performed at [facility], [place] [address] [place], [place] [address]   Blood culture (routine x 2)     Status: Abnormal   Collection Time: 04/23/20  7:30 PM   Specimen: BLOOD  Result Value Ref Range Status   Specimen Description BLOOD RIGHT ANTECUBITAL  Final   Special Requests   Final    BOTTLES DRAWN AEROBIC AND ANAEROBIC Blood Culture adequate volume   Culture  Setup Time   Final    AEROBIC BOTTLE ONLY GRAM POSITIVE COCCI IN CLUSTERS CRITICAL RESULT CALLED TO, READ BACK BY AND VERIFIED WITH: Shellee Milo [facility] 04/24/20 2317 JDW Performed at [facility], [place] [address], [place], [place] [address]    Culture STAPHYLOCOCCUS AUREUS (A)  Final   Report Status 04/26/2020 FINAL   Final   Organism ID, Bacteria STAPHYLOCOCCUS AUREUS  Final      Susceptibility   Staphylococcus aureus - MIC*    CIPROFLOXACIN <=0.5 SENSITIVE Sensitive     ERYTHROMYCIN <=0.25 SENSITIVE Sensitive     GENTAMICIN <=0.5 SENSITIVE Sensitive     OXACILLIN <=0.25 SENSITIVE Sensitive     TETRACYCLINE <=1 SENSITIVE Sensitive     VANCOMYCIN <=0.5 SENSITIVE Sensitive     TRIMETH/SULFA <=10 SENSITIVE Sensitive     CLINDAMYCIN <=0.25 SENSITIVE Sensitive     RIFAMPIN <=0.5 SENSITIVE Sensitive     Inducible Clindamycin NEGATIVE Sensitive     * STAPHYLOCOCCUS AUREUS  Blood Culture ID Panel (Reflexed)     Status: Abnormal  Collection Time: 04/23/20  7:30 PM  Result Value Ref Range Status   Enterococcus species NOT DETECTED NOT DETECTED Final   Listeria monocytogenes NOT DETECTED NOT DETECTED Final   Staphylococcus species DETECTED (A) NOT DETECTED Final    Comment: CRITICAL RESULT CALLED TO, READ BACK BY AND VERIFIED WITH: L SEAY PHARMD 04/24/20 2317 JDW    Staphylococcus aureus (BCID) DETECTED (A) NOT DETECTED Final    Comment: Methicillin (oxacillin) susceptible Staphylococcus aureus (MSSA). Preferred therapy is anti staphylococcal beta lactam antibiotic (Cefazolin or Nafcillin), unless clinically contraindicated. CRITICAL RESULT CALLED TO, READ BACK BY AND VERIFIED WITH: L SEAY PHARMD 04/24/20 2317 JDW    Methicillin resistance NOT DETECTED NOT DETECTED Final   Streptococcus species NOT DETECTED NOT DETECTED Final   Streptococcus agalactiae NOT DETECTED NOT DETECTED Final   Streptococcus pneumoniae NOT DETECTED NOT DETECTED Final   Streptococcus pyogenes NOT DETECTED NOT DETECTED Final   Acinetobacter baumannii NOT DETECTED NOT DETECTED Final   Enterobacteriaceae species NOT DETECTED NOT DETECTED Final   Enterobacter cloacae complex NOT DETECTED NOT DETECTED Final   Escherichia coli NOT DETECTED NOT DETECTED Final   Klebsiella oxytoca NOT DETECTED NOT DETECTED Final   Klebsiella pneumoniae  NOT DETECTED NOT DETECTED Final   Proteus species NOT DETECTED NOT DETECTED Final   Serratia marcescens NOT DETECTED NOT DETECTED Final   Haemophilus influenzae NOT DETECTED NOT DETECTED Final   Neisseria meningitidis NOT DETECTED NOT DETECTED Final   Pseudomonas aeruginosa NOT DETECTED NOT DETECTED Final   Candida albicans NOT DETECTED NOT DETECTED Final   Candida glabrata NOT DETECTED NOT DETECTED Final   Candida krusei NOT DETECTED NOT DETECTED Final   Candida parapsilosis NOT DETECTED NOT DETECTED Final   Candida tropicalis NOT DETECTED NOT DETECTED Final    Comment: Performed at [facility], [address], [place], [place] [address]  Blood culture (routine x 2)     Status: None (Preliminary result)   Collection Time: 04/23/20  7:35 PM   Specimen: BLOOD RIGHT FOREARM  Result Value Ref Range Status   Specimen Description BLOOD RIGHT FOREARM  Final   Special Requests   Final    BOTTLES DRAWN AEROBIC AND ANAEROBIC Blood Culture results may not be optimal due to an inadequate volume of blood received in culture bottles   Culture   Final    NO GROWTH 2 DAYS Performed at [facility], [address] [place], [place] [address]    Report Status PENDING  Incomplete  Aerobic/Anaerobic Culture (surgical/deep wound)     Status: None (Preliminary result)   Collection Time: 04/23/20 10:37 PM   Specimen: Abscess  Result Value Ref Range Status   Specimen Description ABSCESS  Final   Special Requests THORACOULMBAR  Final   Gram Stain   Final    FEW WBC PRESENT, PREDOMINANTLY PMN FEW GRAM POSITIVE COCCI CRITICAL RESULT CALLED TO, READ BACK BY AND VERIFIED WITH: RN T ROWE @0107  04/24/20 BY S GEZAHEGN    Culture   Final    FEW STAPHYLOCOCCUS AUREUS CULTURE REINCUBATED FOR BETTER GROWTH Performed at [facility], [address] [place], [place]    Report Status PENDING  Incomplete  Aerobic/Anaerobic Culture (surgical/deep wound)     Status: None (Preliminary  result)   Collection Time: 04/23/20 10:37 PM   Specimen: Soft Tissue, Other  Result Value Ref Range Status   Specimen Description TISSUE  Final   Special Requests EPIDURAL  Final   Gram Stain  Final    NO WBC SEEN NO ORGANISMS SEEN Performed at [facility], [address], [place], [place] [address]    Culture FEW STAPHYLOCOCCUS AUREUS  Final   Report Status PENDING  Incomplete  MRSA PCR Screening     Status: None   Collection Time: 04/24/20  1:50 AM   Specimen: Nasal Mucosa; Nasopharyngeal  Result Value Ref Range Status   MRSA by PCR NEGATIVE NEGATIVE Final    Comment:        The GeneXpert MRSA Assay (FDA approved for NASAL specimens only), is one component of a comprehensive MRSA colonization surveillance program. It is not intended to diagnose MRSA infection nor to guide or monitor treatment for MRSA infections. Performed at [facility], [address], [place], [place] [address]   Culture, blood (routine x 2)     Status: None (Preliminary result)   Collection Time: 04/24/20  8:20 AM   Specimen: BLOOD LEFT ARM  Result Value Ref Range Status   Specimen Description BLOOD LEFT ARM  Final   Special Requests   Final    BOTTLES DRAWN AEROBIC ONLY Blood Culture adequate volume   Culture   Final    NO GROWTH 1 DAY Performed at [facility], [address] [place], [place] [address]    Report Status PENDING  Incomplete  Culture, blood (routine x 2)     Status: None (Preliminary result)   Collection Time: 04/24/20  8:20 AM   Specimen: BLOOD LEFT ARM  Result Value Ref Range Status   Specimen Description BLOOD LEFT ARM  Final   Special Requests   Final    BOTTLES DRAWN AEROBIC ONLY Blood Culture results may not be optimal due to an inadequate volume of blood received in culture bottles   Culture   Final    NO GROWTH 1 DAY Performed at [facility], [address], [place], [place] [address]    Report Status PENDING  Incomplete  Culture, blood  (routine x 2)     Status: None (Preliminary result)   Collection Time: 04/25/20  7:08 AM   Specimen: BLOOD  Result Value Ref Range Status   Specimen Description BLOOD RIGHT ANTECUBITAL  Final   Special Requests   Final    BOTTLES DRAWN AEROBIC AND ANAEROBIC Blood Culture adequate volume   Culture   Final    NO GROWTH < 12 HOURS Performed at [facility], [address] [place], [place] [address]    Report Status PENDING  Incomplete  Culture, blood (routine x 2)     Status: None (Preliminary result)   Collection Time: 04/25/20  7:14 AM   Specimen: BLOOD  Result Value Ref Range Status   Specimen Description BLOOD LEFT ANTECUBITAL  Final   Special Requests   Final    BOTTLES DRAWN AEROBIC AND ANAEROBIC Blood Culture adequate volume   Culture   Final    NO GROWTH < 12 HOURS Performed at [facility], [address] [place], [place] [address]    Report Status PENDING  Incomplete  Surgical PCR screen     Status: None   Collection Time: 04/25/20  7:54 AM   Specimen: Nasal Mucosa; Nasal Swab  Result Value Ref Range Status   MRSA, PCR NEGATIVE NEGATIVE Final   Staphylococcus aureus NEGATIVE NEGATIVE Final    Comment: (NOTE) The Xpert SA Assay (FDA approved for NASAL specimens in patients 67 years of age and  older), is one component of a comprehensive surveillance program. It is not intended to diagnose infection nor to guide or monitor treatment. Performed at [facility], [address] [place], [place] [address]   Aerobic/Anaerobic Culture (surgical/deep wound)     Status: None (Preliminary result)   Collection Time: 04/25/20  4:44 PM   Specimen: Wound  Result Value Ref Range Status   Specimen Description WOUND  Final   Special Requests RT ACROMIOCLAVICULAR JOINT  Final   Gram Stain   Final    FEW WBC PRESENT, PREDOMINANTLY PMN NO ORGANISMS SEEN Performed at [facility], [address] [place], [place] [address]    Culture PENDING  Incomplete    Report Status PENDING  Incomplete    Cliffton AstersJohn Nohely Whitehorn, MD [facility] [phone] pager   [phone] cell 04/26/2020, 10:22 AM

## 2020-04-26 NOTE — Progress Notes (Signed)
Patient called for assistance to the bathroom. Per  The CNA, on getting in the room to assist, only to observed and noticed the foley laying on the bed with patient  With the balloon fully inflated. No s/s of any distress noted

## 2020-04-26 NOTE — Progress Notes (Signed)
Physical Therapy Treatment Patient Details Name: Debra Schmidt MRN: [id number] DOB: 1960-11-20 Today's Date: 04/26/2020    History of Present Illness Pt is a 59 y.o. female admitted on 04/23/20 for emergent decompression of throacolumbar epidural abcess, discitis L1/2 s/p laminectomy L1/2 and evacuation of abcess.  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 She is now s/p I&D and shoulder, AC joint repair. Infectious disease is following suspicious that both R shoulder and back are staph infections.  Blood cultures are pending.  Pt with no significant medical history listed in chart.    PT Comments    Pt making fair progress overall with mobility. However, was limited this session secondary to a very full bladder with inability to urinate. Pt's RN entering room at end of session to insert a catheter.  Pt remains appropriate for CIR for further intensive therapy services to maximize her independence with functional mobility. Will continue to follow acutely to progress mobility as tolerated.    Follow Up Recommendations  CIR     Equipment Recommendations  3in1 (PT);Wheelchair (measurements PT);Wheelchair cushion (measurements PT)    Recommendations for Other Services       Precautions / Restrictions Precautions Precautions: Fall;Back Precaution Comments: verbally reviewed throughout Required Braces or Orthoses: Spinal Brace;Sling Spinal Brace: Lumbar corset;Applied in sitting position Restrictions Weight Bearing Restrictions: Yes RUE Weight Bearing: Partial weight bearing RUE Partial Weight Bearing Percentage or Pounds: Per ortho note "no strenuous movement" Other Position/Activity Restrictions: No overhead movement with RUE; sling for comfort    Mobility  Bed Mobility Overal bed mobility: Needs Assistance Bed Mobility: Rolling;Sidelying to Sit Rolling: Min assist Sidelying to sit: Mod assist;+2 for safety/equipment       General bed  mobility comments: min A with use of bed pad to roll; cues for log roll technique, assist to guide LEs to EOB + support at trunk to attain sitting  Transfers Overall transfer level: Needs assistance Equipment used: 2 person hand held assist Transfers: Sit to/from [employer] Sit to Stand: Mod assist;+2 physical assistance Stand pivot transfers: Mod assist;+2 safety/equipment       General transfer comment: mod A +2 to rise and steady with transfers, +2 person used for safety. Pt performed transfers multiple times from bed to [facility] to recliner chair and then back to bed as pt's RN was entering to insert a catheter  Ambulation/Gait             General Gait Details: unable to tolerate this date   Stairs             Wheelchair Mobility    Modified Rankin (Stroke Patients Only)       Balance Overall balance assessment: Needs assistance Sitting-balance support: Feet supported Sitting balance-Leahy Scale: Fair     Standing balance support: Bilateral upper extremity supported Standing balance-Leahy Scale: Poor Standing balance comment: reliant on external support                            Cognition Arousal/Alertness: Awake/alert Behavior During Therapy: WFL for tasks assessed/performed Overall Cognitive Status: No family/caregiver present to determine baseline cognitive functioning Area of Impairment: Memory;Safety/judgement                     Memory: Decreased recall of precautions   Safety/Judgement: Decreased awareness of safety;Decreased awareness of deficits     General Comments: pt distracted by pain and discomfort  in sore bladder due to inability to urinate. overall decr awareness of precautions      Exercises      General Comments        Pertinent Vitals/Pain Pain Assessment: Faces Faces Pain Scale: Hurts even more Pain Location: back; full bladder Pain Descriptors / Indicators:  Grimacing;Guarding;Restless Pain Intervention(s): Monitored during session;Repositioned    Home Living Family/patient expects to be discharged to:: Private residence Living Arrangements: Spouse/significant other Available Help at Discharge: Family Type of Home: House Home Access: Stairs to enter Entrance Stairs-Rails: Right;Left;Can reach both Home Layout: One level Home Equipment: Environmental consultant - 4 wheels      Prior Function Level of Independence: Needs assistance  Gait / Transfers Assistance Needed: per pt, she was ambulating with rollator for the past two weeks, fell and scraped her R shoulder.        PT Goals (current goals can now be found in the care plan section) Acute Rehab PT Goals Patient Stated Goal: retun to independence PT Goal Formulation: With patient Time For Goal Achievement: 05/08/20 Potential to Achieve Goals: Good Progress towards PT goals: Progressing toward goals    Frequency    Min 5X/week      PT Plan Current plan remains appropriate    Co-evaluation PT/OT/SLP Co-Evaluation/Treatment: Yes Reason for Co-Treatment: To address functional/ADL transfers;For patient/therapist safety PT goals addressed during session: Mobility/safety with mobility;Balance;Strengthening/ROM        AM-PAC PT "6 Clicks" Mobility   Outcome Measure  Help needed turning from your back to your side while in a flat bed without using bedrails?: A Little Help needed moving from lying on your back to sitting on the side of a flat bed without using bedrails?: A Lot Help needed moving to and from a bed to a chair (including a wheelchair)?: A Lot Help needed standing up from a chair using your arms (e.g., wheelchair or bedside chair)?: A Lot Help needed to walk in hospital room?: Total Help needed climbing 3-5 steps with a railing? : Total 6 Click Score: 11    End of Session Equipment Utilized During Treatment: Gait belt Activity Tolerance: Patient limited by pain Patient left:  in bed;with call bell/phone within reach;with bed alarm set;Other (comment) (RN requesting pt back to bed for catheter placement) Nurse Communication: Mobility status PT Visit Diagnosis: Muscle weakness (generalized) (M62.81);Difficulty in walking, not elsewhere classified (R26.2);Pain;Other symptoms and signs involving the nervous system (R29.898) Pain - part of body:  (back)     Time: 1036-1100 PT Time Calculation (min) (ACUTE ONLY): 24 min  Charges:  $Therapeutic Activity: 8-22 mins                     Anastasio Champion, DPT  Acute Rehabilitation Services Pager [phone] Office [place] 04/26/2020, 1:51 PM

## 2020-04-26 NOTE — Progress Notes (Signed)
   04/26/20 1130  Urine Characteristics  Urinary Interventions Intermittent/Straight cath  Intermittent/Straight Cath (mL) 1300 mL  Hygiene Peri care   Pt pulled foley out this morning around 6:30 am per night RN, she has not been able to void spontaneously and complained of pain with inability to void, therefore I&O cath performed per nursing standing order with output noted above. Will continue to monitor out in 4-6 hours.

## 2020-04-26 NOTE — Progress Notes (Signed)
Inpatient Rehab Admissions:   I met with pt at the bedside as follow up from PM&R MD consult (please see Dr. Aline August consult note from 6/15 for details). Discussed reason for visit and program details. AC reviewed expectations with therapy, expected LOS, and anticipated functional outcomes. With her permission I did speak with her husband who was able to confirm a DC plan for assistance at home. Both the patient and her husband express an interest in this program but have concerns over the cost. Per [facility] note, the pt is to be screened by financial counseling as she is uninsured. I briefly discussed the cost with the patient who would like to speak with her husband about it tonight and is hopeful for some type of assistance if she qualifies. Will continue to follow pt progress and medical workup. Will also continue discussion with pt and her family to see if this program is ultimately something they want to pursue once appropriate.   Raechel Ache, OTR/L  Rehab Admissions Coordinator  [phone] 04/26/2020 2:18 PM

## 2020-04-27 ENCOUNTER — Inpatient Hospital Stay: Payer: Self-pay

## 2020-04-27 LAB — COMPREHENSIVE METABOLIC PANEL
ALT: 9 U/L (ref 0–44)
AST: 15 U/L (ref 15–41)
Albumin: 1.8 g/dL — ABNORMAL LOW (ref 3.5–5.0)
Alkaline Phosphatase: 71 U/L (ref 38–126)
Anion gap: 10 (ref 5–15)
BUN: 5 mg/dL — ABNORMAL LOW (ref 6–20)
CO2: 27 mmol/L (ref 22–32)
Calcium: 8.2 mg/dL — ABNORMAL LOW (ref 8.9–10.3)
Chloride: 101 mmol/L (ref 98–111)
Creatinine, Ser: 0.56 mg/dL (ref 0.44–1.00)
GFR calc Af Amer: >60 mL/min (ref >60)
GFR calc non Af Amer: >60 mL/min (ref >60)
Glucose, Bld: 112 mg/dL — ABNORMAL HIGH (ref 70–99)
Potassium: 2.9 mmol/L — ABNORMAL LOW (ref 3.5–5.1)
Sodium: 138 mmol/L (ref 135–145)
Total Bilirubin: 0.6 mg/dL (ref 0.3–1.2)
Total Protein: 6.4 g/dL — ABNORMAL LOW (ref 6.5–8.1)

## 2020-04-27 LAB — MAGNESIUM: Magnesium: 1.3 mg/dL — ABNORMAL LOW (ref 1.7–2.4)

## 2020-04-27 MED ORDER — SODIUM CHLORIDE 0.9% FLUSH: 10.0000 mL | INTRAVENOUS | Status: DC | PRN

## 2020-04-27 MED ORDER — METHOCARBAMOL 500 MG PO TABS
500.0000 mg | ORAL_TABLET | Freq: Four times a day (QID) | ORAL | Status: DC | PRN
  Administered 2020-04-27 – 2020-05-09 (×16): 500 mg via ORAL
  Filled 2020-04-27 (×16): qty 1

## 2020-04-27 MED ORDER — POTASSIUM CHLORIDE CRYS ER 20 MEQ PO TBCR
40.0000 meq | EXTENDED_RELEASE_TABLET | Freq: Three times a day (TID) | ORAL | Status: DC
  Administered 2020-04-27 (×2): 40 meq via ORAL
  Filled 2020-04-27 (×2): qty 2

## 2020-04-27 MED ORDER — METHOCARBAMOL 1000 MG/10ML IJ SOLN
500.0000 mg | Freq: Four times a day (QID) | INTRAVENOUS | Status: DC | PRN
  Administered 2020-04-27 – 2020-04-28 (×3): 500 mg via INTRAVENOUS
  Filled 2020-04-27 (×2): qty 5
  Filled 2020-04-27: qty 500
  Filled 2020-04-27: qty 5

## 2020-04-27 MED ORDER — METOPROLOL TARTRATE 5 MG/5ML IV SOLN: 5.0000 mg | INTRAVENOUS | Status: DC | PRN

## 2020-04-27 MED ORDER — HYDRALAZINE HCL 20 MG/ML IJ SOLN
5.0000 mg | INTRAMUSCULAR | Status: DC | PRN
  Administered 2020-04-27: 5 mg via INTRAVENOUS
  Filled 2020-04-27: qty 1

## 2020-04-27 MED ORDER — SODIUM CHLORIDE 0.9% FLUSH
10.0000 mL | Freq: Two times a day (BID) | INTRAVENOUS | Status: DC
  Administered 2020-04-27 – 2020-05-09 (×15): 10 mL

## 2020-04-27 MED ORDER — MAGNESIUM SULFATE 4 GM/100ML IV SOLN
4.0000 g | Freq: Once | INTRAVENOUS | Status: AC
  Administered 2020-04-27: 4 g via INTRAVENOUS
  Filled 2020-04-27: qty 100

## 2020-04-27 NOTE — Op Note (Signed)
04/25/2020  10:15 AM  PATIENT:  Debra Schmidt    PRE-OPERATIVE DIAGNOSIS:  Right AC joint septic arthritis w/osteo  POST-OPERATIVE DIAGNOSIS:  Same  PROCEDURE:  Open debridement R AC joint; Distal clavicle repair, ACROMIO-CLAVICULAR JOINT REPAIR, RESECTION DISTAL CLAVICAL  SURGEON:  Sheral Apley, MD  ASSISTANT: Aquilla Hacker, PA-C, he was present and scrubbed throughout the case, critical for completion in a timely fashion, and for retraction, instrumentation, and closure.   ANESTHESIA:   gen  PREOPERATIVE INDICATIONS:  Debra Schmidt is a  59 y.o. female with a diagnosis of Right AC joint septic arthritis w/osteo who failed conservative measures and elected for surgical management.    The risks benefits and alternatives were discussed with the patient preoperatively including but not limited to the risks of infection, bleeding, nerve injury, cardiopulmonary complications, the need for revision surgery, among others, and the patient was willing to proceed.  OPERATIVE IMPLANTS: none  OPERATIVE FINDINGS: purulent AC joint  BLOOD LOSS: min  COMPLICATIONS: none  TOURNIQUET TIME: none  OPERATIVE PROCEDURE:  Patient was identified in the preoperative holding area and site was marked by me She was transported to the operating theater and placed on the table in supine position taking care to pad all bony prominences. After a preincinduction time out anesthesia was induced. The right upper extremity was prepped and draped in normal sterile fashion and a pre-incision timeout was performed. She received ancef after culture for preoperative antibiotics.   She was placed in the beachchair position again all bony prominences were padded the right upper extremity was prepped and draped.  I made a longitudinal incision over her distal clavicle and AC joint.  Purulent fluid was draining from this wound.  I sharply incised the [facility] joint capsule and expressed purulent fluid this was cultured  and sent to lab she was given antibiotics.  Next I performed an extensive debridement of all devitalized tissue with a rondure this was periosteum.  After performing the arthrotomy irrigated the AC joint.  After then expressed all fluid from this abscess.  Next I used a ACL saw to remove the devitalized distal clavicle.  I then performed a thorough irrigation.  Performed a complex closure of her wound.  Sterile dressing was applied she was awoken taken the PACU in stable condition  POST OPERATIVE PLAN: Sling for comfort DVT prophylaxis per primary team

## 2020-04-27 NOTE — Progress Notes (Addendum)
Chui MD is aware of patient remaining tachypnea (sustaining in high 20s and 30s), assessed patient and her only complaint is pain to mid back which occasionally radiates to her lower abdominal area. This has been addressed with PRN pain and muscle relaxant medication. Denies any other issues. Will continue to monitor.

## 2020-04-27 NOTE — Progress Notes (Addendum)
PHARMACY CONSULT NOTE FOR:  OUTPATIENT  PARENTERAL ANTIBIOTIC THERAPY (OPAT)  Indication: MSSA Bacteremia and Lumbar Abscess Regimen: Cefazolin 2 g IV Q8H End date: 06/03/2020  IV antibiotic discharge orders are pended. To discharging provider:  please sign these orders via discharge navigator,  Select New Orders & click on the button choice - Manage This Unsigned Work.     Thank you for allowing pharmacy to be a part of this patient's care.  Sharin Mons, PharmD, BCPS, BCIDP Infectious Diseases Clinical Pharmacist Phone: [phone]

## 2020-04-27 NOTE — Progress Notes (Signed)
Patient ID: Debra Schmidt, female   DOB: 1961/06/11, 59 y.o.   MRN: [id number]         [facility] for Infectious Disease  Date of Admission:  04/23/2020   Total days of antibiotics 5        Day 3 cefazolin         ASSESSMENT: She has widely disseminated MSSA infection with bacteremia, spine infection and right shoulder osteomyelitis.  Repeat blood cultures are negative at 72 hours.  I will order PICC placement in anticipation of prolonged cefazolin therapy.  PLAN: 1. Continue cefazolin for a minimum of 6 weeks 2. PICC placement  3. I will arrange follow-up in my clinic next month and sign off now  Diagnosis: Bacteremia, spine infection and right shoulder osteomyelitis  Culture Result: MSSA  Allergies  Allergen Reactions  . Penicillins Hives    Tolerated Zosyn 04/2020    OPAT Orders Discharge antibiotics to be given via PICC line Discharge antibiotics: Per pharmacy protocol cefazolin  Duration: 6 weeks End Date: 06/03/2020  [facility] Per Protocol:  Home health RN for IV administration and teaching; PICC line care and labs.    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 amLODipine  5 mg Oral Daily  . Chlorhexidine Gluconate Cloth  6 each Topical Daily  . docusate sodium  100 mg Oral BID  . lisinopril  5 mg Oral Daily  . oxyCODONE  15 mg Oral Q12H  . pantoprazole (PROTONIX) IV  40 mg  Intravenous QHS  . sodium chloride flush  3 mL Intravenous Q12H   Continuous Infusions: . sodium chloride    . sodium chloride    . 0.9 % NaCl with KCl 20 mEq / L 80 mL/hr at 04/27/20 1329  .  ceFAZolin (ANCEF) IV 2 g (04/27/20 1224)  . methocarbamol (ROBAXIN) IV 500 mg (04/27/20 0555)   PRN Meds:.acetaminophen **OR** acetaminophen, bisacodyl, diazepam, hydrALAZINE, magnesium citrate, menthol-cetylpyridinium **OR** phenol, methocarbamol (ROBAXIN) IV **OR** methocarbamol, metoprolol tartrate, morphine injection, ondansetron **OR** ondansetron (ZOFRAN) IV, oxyCODONE, oxyCODONE, phenazopyridine, senna-docusate, sodium chloride flush, sodium chloride flush, zolpidem   SUBJECTIVE: She is feeling better.  She is still having back pain but says that she is not having any shoulder pain.  Review of Systems: Review of Systems  Constitutional: Negative for chills, diaphoresis and fever.  Respiratory: Negative for cough and shortness of breath.   Cardiovascular: Negative for chest pain.  Musculoskeletal: Positive for back pain. Negative for joint pain.    Allergies  Allergen Reactions  . Penicillins Hives    Tolerated Zosyn 04/2020    OBJECTIVE: Vitals:   04/27/20 0505 04/27/20 0600 04/27/20 0755 04/27/20 1221  BP: (!) 149/89 (!) 155/89 (!) 159/82 (!) 149/86  Pulse: 91 94 89 93  Resp: (!) 28 (!) 36 (!) 30 (!) 33  Temp:   98.7 F (37.1 C) 98 F (36.7 C)  TempSrc:    Oral  SpO2: 96% 94% 96% 99%  Weight:      Height:       Body mass index is 23.69 kg/m.  Physical Exam Constitutional:      Comments: She is more alert and appears more comfortable today.  Cardiovascular:     Rate and Rhythm: Normal rate and regular rhythm.     Heart sounds: No murmur heard.   Pulmonary:     Effort: Pulmonary effort is normal.     Breath sounds: Normal breath sounds.  Psychiatric:        Mood and Affect: Mood normal.     Lab Results Lab Results  Component Value Date   WBC 16.2 (H)  04/26/2020   HGB 8.9 (L) 04/26/2020   HCT 27.5 (L) 04/26/2020   MCV 83.6 04/26/2020   PLT 379 04/26/2020    Lab Results  Component Value Date   CREATININE 0.56 04/27/2020   BUN 5 (L) 04/27/2020   NA 138 04/27/2020   K 2.9 (L) 04/27/2020   CL 101 04/27/2020   CO2 27 04/27/2020    Lab Results  Component Value Date   ALT 9 04/27/2020   AST 15 04/27/2020   ALKPHOS 71 04/27/2020   BILITOT 0.6 04/27/2020     Microbiology: Recent Results (from the past 240 hour(s))  SARS Coronavirus 2 by RT PCR (hospital order, performed in [place] hospital lab) Nasopharyngeal Nasopharyngeal Swab     Status: None   Collection Time: 04/23/20  6:51 PM   Specimen: Nasopharyngeal Swab  Result Value Ref Range Status   SARS Coronavirus 2 NEGATIVE NEGATIVE Final    Comment: (NOTE) SARS-CoV-2 target nucleic acids are NOT DETECTED.  The SARS-CoV-2 RNA is generally detectable in upper and lower respiratory specimens during the acute phase of infection. The lowest concentration of SARS-CoV-2 viral copies this assay can detect is 250 copies / mL. A negative result does not preclude SARS-CoV-2 infection and should not be used as the sole basis for treatment or other patient management decisions.  A negative result may occur with improper specimen collection / handling, submission of specimen other than nasopharyngeal swab, presence of viral mutation(s) within the areas targeted by this assay, and inadequate number of viral copies (<250 copies / mL). A negative result must be combined with clinical observations, patient history, and epidemiological information.  Fact Sheet for Patients:   StrictlyIdeas.no  Fact Sheet for Healthcare Providers: BankingDealers.co.za  This test is not yet approved or  cleared by the [place] FDA and has been authorized for detection and/or diagnosis of SARS-CoV-2 by FDA under an Emergency Use Authorization (EUA).   This EUA will remain in effect (meaning this test can be used) for the duration of the COVID-19 declaration under Section 564(b)(1) of the Act, 21 U.S.C. section 360bbb-3(b)(1), unless the authorization is terminated or revoked sooner.  Performed at [facility], [place] [address], [place], [place] [address]   Blood culture (routine x 2)     Status: Abnormal   Collection Time: 04/23/20  7:30 PM   Specimen: BLOOD  Result Value Ref Range Status   Specimen Description BLOOD RIGHT ANTECUBITAL  Final   Special Requests   Final    BOTTLES DRAWN AEROBIC AND ANAEROBIC Blood Culture adequate volume   Culture  Setup Time   Final    AEROBIC BOTTLE ONLY GRAM POSITIVE COCCI IN CLUSTERS CRITICAL RESULT CALLED TO, READ BACK BY AND VERIFIED WITH: L SEAY PHARMD  04/24/20 2317 JDW Performed at [facility], [place] [address] [place], [place] [address]    Culture STAPHYLOCOCCUS AUREUS (A)  Final   Report Status 04/26/2020 FINAL  Final   Organism ID, Bacteria STAPHYLOCOCCUS AUREUS  Final      Susceptibility   Staphylococcus aureus - MIC*    CIPROFLOXACIN <=0.5 SENSITIVE Sensitive     ERYTHROMYCIN <=0.25 SENSITIVE Sensitive     GENTAMICIN <=0.5 SENSITIVE Sensitive     OXACILLIN <=0.25 SENSITIVE Sensitive     TETRACYCLINE <=1 SENSITIVE Sensitive     VANCOMYCIN <=0.5 SENSITIVE Sensitive     TRIMETH/SULFA <=10 SENSITIVE Sensitive     CLINDAMYCIN <=0.25 SENSITIVE Sensitive     RIFAMPIN <=0.5 SENSITIVE Sensitive     Inducible Clindamycin NEGATIVE Sensitive     * STAPHYLOCOCCUS AUREUS  Blood Culture ID Panel (Reflexed)     Status: Abnormal   Collection Time: 04/23/20  7:30 PM  Result Value Ref Range Status   Enterococcus species NOT DETECTED NOT DETECTED Final   Listeria monocytogenes NOT DETECTED NOT DETECTED Final   Staphylococcus species DETECTED (A) NOT DETECTED Final    Comment: CRITICAL RESULT CALLED TO, READ BACK BY AND VERIFIED WITH: L SEAY PHARMD 04/24/20 2317 JDW    Staphylococcus  aureus (BCID) DETECTED (A) NOT DETECTED Final    Comment: Methicillin (oxacillin) susceptible Staphylococcus aureus (MSSA). Preferred therapy is anti staphylococcal beta lactam antibiotic (Cefazolin or Nafcillin), unless clinically contraindicated. CRITICAL RESULT CALLED TO, READ BACK BY AND VERIFIED WITH: L SEAY PHARMD 04/24/20 2317 JDW    Methicillin resistance NOT DETECTED NOT DETECTED Final   Streptococcus species NOT DETECTED NOT DETECTED Final   Streptococcus agalactiae NOT DETECTED NOT DETECTED Final   Streptococcus pneumoniae NOT DETECTED NOT DETECTED Final   Streptococcus pyogenes NOT DETECTED NOT DETECTED Final   Acinetobacter baumannii NOT DETECTED NOT DETECTED Final   Enterobacteriaceae species NOT DETECTED NOT DETECTED Final   Enterobacter cloacae complex NOT DETECTED NOT DETECTED Final   Escherichia coli NOT DETECTED NOT DETECTED Final   Klebsiella oxytoca NOT DETECTED NOT DETECTED Final   Klebsiella pneumoniae NOT DETECTED NOT DETECTED Final   Proteus species NOT DETECTED NOT DETECTED Final   Serratia marcescens NOT DETECTED NOT DETECTED Final   Haemophilus influenzae NOT DETECTED NOT DETECTED Final   Neisseria meningitidis NOT DETECTED NOT DETECTED Final   Pseudomonas aeruginosa NOT DETECTED NOT DETECTED Final   Candida albicans NOT DETECTED NOT DETECTED Final   Candida glabrata NOT DETECTED NOT DETECTED Final   Candida krusei NOT DETECTED NOT DETECTED Final   Candida parapsilosis NOT DETECTED NOT DETECTED Final   Candida tropicalis NOT DETECTED NOT DETECTED Final    Comment: Performed at [facility], [place] [address] [place], [place] [address]  Blood culture (routine x 2)     Status: None (Preliminary result)   Collection Time: 04/23/20  7:35 PM   Specimen: BLOOD RIGHT FOREARM  Result Value Ref Range Status   Specimen Description BLOOD RIGHT FOREARM  Final   Special Requests   Final    BOTTLES DRAWN AEROBIC AND ANAEROBIC Blood Culture results may not be optimal  due to an inadequate volume of blood received in culture bottles   Culture   Final    NO GROWTH 4 DAYS Performed at [facility], [place] [address] [place],  [address]    Report Status PENDING  Incomplete  Aerobic/Anaerobic Culture (surgical/deep wound)     Status: None (Preliminary result)   Collection  Time: 04/23/20 10:37 PM   Specimen: Abscess  Result Value Ref Range Status   Specimen Description ABSCESS  Final   Special Requests THORACOULMBAR  Final   Gram Stain   Final    FEW WBC PRESENT, PREDOMINANTLY PMN FEW GRAM POSITIVE COCCI CRITICAL RESULT CALLED TO, READ BACK BY AND VERIFIED WITH: RN T ROWE @0107  04/24/20 BY S GEZAHEGN Performed at [facility], [place] [address] [place], [place] [address]    Culture   Final    FEW STAPHYLOCOCCUS AUREUS NO ANAEROBES ISOLATED; CULTURE IN PROGRESS FOR 5 DAYS    Report Status PENDING  Incomplete   Organism ID, Bacteria STAPHYLOCOCCUS AUREUS  Final      Susceptibility   Staphylococcus aureus - MIC*    CIPROFLOXACIN <=0.5 SENSITIVE Sensitive     ERYTHROMYCIN <=0.25 SENSITIVE Sensitive     GENTAMICIN <=0.5 SENSITIVE Sensitive     OXACILLIN <=0.25 SENSITIVE Sensitive     TETRACYCLINE <=1 SENSITIVE Sensitive     VANCOMYCIN <=0.5 SENSITIVE Sensitive     TRIMETH/SULFA <=10 SENSITIVE Sensitive     CLINDAMYCIN <=0.25 SENSITIVE Sensitive     RIFAMPIN <=0.5 SENSITIVE Sensitive     Inducible Clindamycin NEGATIVE Sensitive     * FEW STAPHYLOCOCCUS AUREUS  Aerobic/Anaerobic Culture (surgical/deep wound)     Status: None (Preliminary result)   Collection Time: 04/23/20 10:37 PM   Specimen: Soft Tissue, Other  Result Value Ref Range Status   Specimen Description ABSCESS  Final   Special Requests EPIDURAL  Final   Gram Stain   Final    NO WBC SEEN NO ORGANISMS SEEN Performed at [facility], [address] [place], [place] [address]    Culture   Final    FEW STAPHYLOCOCCUS AUREUS NO ANAEROBES ISOLATED; CULTURE IN  PROGRESS FOR 5 DAYS    Report Status PENDING  Incomplete   Organism ID, Bacteria STAPHYLOCOCCUS AUREUS  Final      Susceptibility   Staphylococcus aureus - MIC*    CIPROFLOXACIN <=0.5 SENSITIVE Sensitive     ERYTHROMYCIN <=0.25 SENSITIVE Sensitive     GENTAMICIN <=0.5 SENSITIVE Sensitive     OXACILLIN <=0.25 SENSITIVE Sensitive     TETRACYCLINE <=1 SENSITIVE Sensitive     VANCOMYCIN <=0.5 SENSITIVE Sensitive     TRIMETH/SULFA <=10 SENSITIVE Sensitive     CLINDAMYCIN <=0.25 SENSITIVE Sensitive     RIFAMPIN <=0.5 SENSITIVE Sensitive     Inducible Clindamycin NEGATIVE Sensitive     * FEW STAPHYLOCOCCUS AUREUS  MRSA PCR Screening     Status: None   Collection Time: 04/24/20  1:50 AM   Specimen: Nasal Mucosa; Nasopharyngeal  Result Value Ref Range Status   MRSA by PCR NEGATIVE NEGATIVE Final    Comment:        The GeneXpert MRSA Assay (FDA approved for NASAL specimens only), is one component of a comprehensive MRSA colonization surveillance program. It is not intended to diagnose MRSA infection nor to guide or monitor treatment for MRSA infections. Performed at [facility], [place] [address] [place], [place] [address]   Culture, blood (routine x 2)     Status: None (Preliminary result)   Collection Time: 04/24/20  8:20 AM   Specimen: BLOOD LEFT ARM  Result Value Ref Range Status   Specimen Description BLOOD LEFT ARM  Final   Special Requests   Final    BOTTLES DRAWN AEROBIC ONLY Blood Culture adequate volume   Culture   Final  NO GROWTH 3 DAYS Performed at [facility], [place] [address], [place], [place] [address]    Report Status PENDING  Incomplete  Culture, blood (routine x 2)     Status: None (Preliminary result)   Collection Time: 04/24/20  8:20 AM   Specimen: BLOOD LEFT ARM  Result Value Ref Range Status   Specimen Description BLOOD LEFT ARM  Final   Special Requests   Final    BOTTLES DRAWN AEROBIC ONLY Blood Culture results may not be optimal due to  an inadequate volume of blood received in culture bottles   Culture   Final    NO GROWTH 3 DAYS Performed at [facility], [place] [address] [place], [place] [address]    Report Status PENDING  Incomplete  Culture, blood (routine x 2)     Status: None (Preliminary result)   Collection Time: 04/25/20  7:08 AM   Specimen: BLOOD  Result Value Ref Range Status   Specimen Description BLOOD RIGHT ANTECUBITAL  Final   Special Requests   Final    BOTTLES DRAWN AEROBIC AND ANAEROBIC Blood Culture adequate volume   Culture   Final    NO GROWTH 2 DAYS Performed at [facility], [place] [address], [place], [place] [address]    Report Status PENDING  Incomplete  Culture, blood (routine x 2)     Status: None (Preliminary result)   Collection Time: 04/25/20  7:14 AM   Specimen: BLOOD  Result Value Ref Range Status   Specimen Description BLOOD LEFT ANTECUBITAL  Final   Special Requests   Final    BOTTLES DRAWN AEROBIC AND ANAEROBIC Blood Culture adequate volume   Culture   Final    NO GROWTH 2 DAYS Performed at [facility], [place] [address] [place], [place] [address]    Report Status PENDING  Incomplete  Surgical PCR screen     Status: None   Collection Time: 04/25/20  7:54 AM   Specimen: Nasal Mucosa; Nasal Swab  Result Value Ref Range Status   MRSA, PCR NEGATIVE NEGATIVE Final   Staphylococcus aureus NEGATIVE NEGATIVE Final    Comment: (NOTE) The Xpert SA Assay (FDA approved for NASAL specimens in patients 82 years of age and older), is one component of a comprehensive surveillance program. It is not intended to diagnose infection nor to guide or monitor treatment. Performed at [facility], [place] [address], [place], [place] [address]   Aerobic/Anaerobic Culture (surgical/deep wound)     Status: None (Preliminary result)   Collection Time: 04/25/20  4:44 PM   Specimen: Wound  Result Value Ref Range Status   Specimen Description WOUND  Final   Special Requests RT  ACROMIOCLAVICULAR JOINT  Final   Gram Stain   Final    FEW WBC PRESENT, PREDOMINANTLY PMN NO ORGANISMS SEEN Performed at [facility], [address] [place], [place] [address]    Culture   Final    RARE STAPHYLOCOCCUS AUREUS SUSCEPTIBILITIES TO FOLLOW NO ANAEROBES ISOLATED; CULTURE IN PROGRESS FOR 5 DAYS    Report Status PENDING  Incomplete    Michel Bickers, MD [facility] [phone] pager   [phone] cell 04/27/2020, 1:34 PM

## 2020-04-27 NOTE — Progress Notes (Signed)
PROGRESS NOTE    Debra Schmidt  YWV:[id number] DOB: [birth year] DOA: 04/23/2020 PCP: Patient, No Pcp Per    Brief Narrative:  59 y.o.female with remote history of Xanax and hydrocodone use presented to the ED 6/13 , she states that she fell in the bathtub several weeks ago injuring her right shoulder, eventually developed a draining wound on the top of her shoulder. She began to have progressively more severe back pain left urinary incontinence leading to admission 6/13. MRI showed L1-2 discitis with thoracolumbar abscess. She underwent emergent surgery by Dr. 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 ID to f/u in clinic next month -Underwent L1/2 laminectomy, epidural abscess evacuation on 6/13 -Follow-up OR cultures, anticipate MSSA -Pt underwent I&D and distal clavicle resection 6/15 -PT OT recs for CIR  Chronic hypokalemia -Etiology not clear -Chart reviewed. Pt has hx of profound hypokalemia, first documented here on 02/24/20 with a K of 2.7 -She recalls being followed for hypokalemia when she was following up with her PCP over 5 years ago. Currently not seeing PCP -random cortisol level normal -Repeat potassium initially improved on 6/16, however this AM, down to 2.9. overall potassium trend seems to be slowly improving over time.  Mg low at 1.3, will replace. Suspect chronically depleted K and Mg. Cont to replace aggressively -Repeat lytes in AM  Acute on chronic anemia -The setting of severe infections and surgery -Cont to monitor  Hypertension -BP trends somewhat improved since starting low dose lisinopril -have ordered PRN lopressor IV -cont to titrate BP meds as tolerated  DVT prophylaxis: SCD's Code Status: Full Family Communication: Pt in room, family not at bedside  Consultants:   Orthopedic surgery  ID  Procedures:  PRE-OPERATIVE DIAGNOSIS: Epidural Mass  POST-OPERATIVE DIAGNOSIS: Epidural Abscess, L1/2 discitis  PROCEDURE: Procedure(s): Lumbar One-Two LAMINECTOMY for Epidural Abscess Evacuation  SURGEON:Surgeon(s):  Ashok Pall, MD   Antimicrobials: Anti-infectives (From admission, onward)   Start     Dose/Rate Route Frequency Ordered Stop   04/25/20 1641  vancomycin (VANCOCIN) powder  Status:  Discontinued          As needed 04/25/20 1641 04/25/20 1657   04/25/20 1200  ceFAZolin (ANCEF) IVPB 2g/100 mL premix     Discontinue     2 g 200 mL/hr over 30 Minutes Intravenous Every 8 hours 04/25/20 0930 06/03/20 2359   04/24/20 1200  vancomycin (VANCOREADY) IVPB 750 mg/150 mL  Status:  Discontinued        750 mg 150 mL/hr over 60 Minutes Intravenous Every 12 hours 04/24/20 0156 04/25/20 0930   04/24/20 0800  piperacillin-tazobactam (ZOSYN) IVPB 3.375 g  Status:  Discontinued        3.375 g 12.5 mL/hr over 240 Minutes Intravenous Every 8 hours 04/24/20 0156 04/24/20 1423   04/23/20 2215  piperacillin-tazobactam (ZOSYN) IVPB 3.375 g        3.375 g 100  mL/hr over 30 Minutes Intravenous  Once 04/23/20 2202 04/24/20 0031   04/23/20 2215  vancomycin (VANCOREADY) IVPB 1250 mg/250 mL        1,250 mg 166.7 mL/hr over 90 Minutes Intravenous  Once 04/23/20 2202 04/24/20 0101      Subjective: No complaints this AM  Objective: Vitals:   04/27/20 0505 04/27/20 0600 04/27/20 0755  04/27/20 1221  BP: (!) 149/89 (!) 155/89 (!) 159/82 (!) 149/86  Pulse: 91 94 89 93  Resp: (!) 28 (!) 36 (!) 30 (!) 33  Temp:   98.7 F (37.1 C) 98 F (36.7 C)  TempSrc:    Oral  SpO2: 96% 94% 96% 99%  Weight:      Height:        Intake/Output Summary (Last 24 hours) at 04/27/2020 1500 Last data filed at 04/27/2020 0920 Gross per 24 hour  Intake --  Output 3150 ml  Net -3150 ml   Filed Weights   04/23/20 1332  Weight: 62.6 kg    Examination: General exam: Awake, laying in bed, in nad Respiratory system: Normal respiratory effort, no wheezing Cardiovascular system: regular rate, s1, s2 Gastrointestinal system: Soft, nondistended, positive BS Central nervous system: CN2-12 grossly intact, strength intact Extremities: Perfused, no clubbing Skin: Normal skin turgor, no notable skin lesions seen Psychiatry: Mood normal // no visual hallucinations   Data Reviewed: I have personally reviewed following labs and imaging studies  CBC: Recent Labs  Lab 04/23/20 1339 04/23/20 2056 04/23/20 2356 04/24/20 0605 04/25/20 0754 04/25/20 1402 04/26/20 0630  WBC 9.1  --   --  11.3* 14.2*  --  16.2*  HGB 10.4*   < > 8.5* 8.7* 8.9* 9.2* 8.9*  HCT 32.2*   < > 25.0* 27.3* 27.6* 27.0* 27.5*  MCV 84.7  --   --  84.8 84.7  --  83.6  PLT 407*  --   --  366 347  --  379   < > = values in this interval not displayed.   Basic Metabolic Panel: Recent Labs  Lab 04/23/20 1339 04/23/20 2054 04/24/20 0605 04/24/20 0605 04/24/20 1039 04/25/20 0528 04/25/20 0754 04/25/20 1402 04/26/20 0630 04/26/20 1656 04/27/20 0949  NA 140   < > 140   < >  --   --  140 141 136 138 138  K 2.0*   < > 2.2*   < >  --   --  2.4* 2.7* 2.5* 4.1 2.9*  CL 97*   < > 101   < >  --   --  101 97* 97* 100 101  CO2 27   < > 29  --   --   --  28  --  29 28 27   GLUCOSE 143*   < > 120*   < >  --   --  119* 103* 97 96 112*  BUN 5*   < > <5*   < >  --   --  <5* 4* <5* 5* 5*  CREATININE 0.79   < > 0.69   < >  --   --   0.55 0.40* 0.50 0.55 0.56  CALCIUM 9.4   < > 8.8*  --   --   --  8.5*  --  8.0* 8.2* 8.2*  MG 1.6*  --  1.8  --   --   --   --   --   --   --  1.3*  PHOS  --   --   --   --  3.8 2.3*  --   --   --   --   --    < > = values in this interval not displayed.   GFR: Estimated Creatinine Clearance: 65.4 mL/min (by C-G formula based on SCr of 0.56 mg/dL). Liver Function Tests: Recent Labs  Lab 04/23/20 1339 04/24/20 0605 04/27/20 0949  AST 19 18 15   ALT 13 11 9   ALKPHOS 74 64 71  BILITOT 0.6 0.7 0.6  PROT 7.7 6.4* 6.4*  ALBUMIN 2.4* 1.9* 1.8*   Recent Labs  Lab 04/23/20 1339  LIPASE 25   No results for input(s): AMMONIA in the last 168 hours. Coagulation Profile: No results for input(s): INR, PROTIME in the last 168 hours. Cardiac Enzymes: No results for input(s): CKTOTAL, CKMB, CKMBINDEX, TROPONINI in the last 168 hours. BNP (last 3 results) No results for input(s): PROBNP in the last 8760 hours. HbA1C: No results for input(s): HGBA1C in the last 72 hours. CBG: Recent Labs  Lab 04/24/20 0550  GLUCAP 109*   Lipid Profile: No results for input(s): CHOL, HDL, LDLCALC, TRIG, CHOLHDL, LDLDIRECT in the last 72 hours. Thyroid Function Tests: No results for input(s): TSH, T4TOTAL, FREET4, T3FREE, THYROIDAB in the last 72 hours. Anemia Panel: No results for input(s): VITAMINB12, FOLATE, FERRITIN, TIBC, IRON, RETICCTPCT in the last 72 hours. Sepsis Labs: No results for input(s): PROCALCITON, LATICACIDVEN in the last 168 hours.  Recent Results (from the past 240 hour(s))  SARS Coronavirus 2 by RT PCR (hospital order, performed in [facility] hospital lab) Nasopharyngeal Nasopharyngeal Swab     Status: None   Collection Time: 04/23/20  6:51 PM   Specimen: Nasopharyngeal Swab  Result Value Ref Range Status   SARS Coronavirus 2 NEGATIVE NEGATIVE Final    Comment: (NOTE) SARS-CoV-2 target nucleic acids are NOT DETECTED.  The SARS-CoV-2 RNA is generally detectable in upper and  lower respiratory specimens during the acute phase of infection. The lowest concentration of SARS-CoV-2 viral copies this assay can detect is 250 copies / mL. A negative result does not preclude SARS-CoV-2 infection and should not be used as the sole basis for treatment or other patient management decisions.  A negative result may occur with improper specimen collection / handling, submission of specimen other than nasopharyngeal swab, presence of viral mutation(s) within the areas targeted by this assay, and inadequate number of viral copies (<250 copies / mL). A negative result must be combined with clinical observations, patient history, and epidemiological information.  Fact Sheet for Patients:   [facility]  Fact Sheet for Healthcare Providers: 04/25/20  This test is not yet approved or  cleared by the [employer] FDA and has been authorized for detection and/or diagnosis of SARS-CoV-2 by FDA under an Emergency Use Authorization (EUA).  This EUA will remain in effect (meaning this test can be used) for the duration of the COVID-19 declaration under Section 564(b)(1) of the Act, 21 U.S.C. section 360bbb-3(b)(1), unless the authorization is terminated or revoked sooner.  Performed at [facility], [address], [place], [address] [place]   Blood culture (routine x 2)     Status: Abnormal   Collection Time: 04/23/20  7:30 PM   Specimen: BLOOD  Result Value Ref Range Status   Specimen Description BLOOD RIGHT ANTECUBITAL  Final   Special Requests   Final    BOTTLES DRAWN AEROBIC AND ANAEROBIC Blood Culture adequate volume   Culture  Setup Time   Final    AEROBIC BOTTLE ONLY GRAM  POSITIVE COCCI IN CLUSTERS CRITICAL RESULT CALLED TO, READ BACK BY AND VERIFIED WITH: Tori Milks [facility] 04/24/20 2317 JDW Performed at [facility], [address], [place], [place] [address]    Culture STAPHYLOCOCCUS AUREUS (A)   Final   Report Status 04/26/2020 FINAL  Final   Organism ID, Bacteria STAPHYLOCOCCUS AUREUS  Final      Susceptibility   Staphylococcus aureus - MIC*    CIPROFLOXACIN <=0.5 SENSITIVE Sensitive     ERYTHROMYCIN <=0.25 SENSITIVE Sensitive     GENTAMICIN <=0.5 SENSITIVE Sensitive     OXACILLIN <=0.25 SENSITIVE Sensitive     TETRACYCLINE <=1 SENSITIVE Sensitive     VANCOMYCIN <=0.5 SENSITIVE Sensitive     TRIMETH/SULFA <=10 SENSITIVE Sensitive     CLINDAMYCIN <=0.25 SENSITIVE Sensitive     RIFAMPIN <=0.5 SENSITIVE Sensitive     Inducible Clindamycin NEGATIVE Sensitive     * STAPHYLOCOCCUS AUREUS  Blood Culture ID Panel (Reflexed)     Status: Abnormal   Collection Time: 04/23/20  7:30 PM  Result Value Ref Range Status   Enterococcus species NOT DETECTED NOT DETECTED Final   Listeria monocytogenes NOT DETECTED NOT DETECTED Final   Staphylococcus species DETECTED (A) NOT DETECTED Final    Comment: CRITICAL RESULT CALLED TO, READ BACK BY AND VERIFIED WITH: L SEAY PHARMD 04/24/20 2317 JDW    Staphylococcus aureus (BCID) DETECTED (A) NOT DETECTED Final    Comment: Methicillin (oxacillin) susceptible Staphylococcus aureus (MSSA). Preferred therapy is anti staphylococcal beta lactam antibiotic (Cefazolin or Nafcillin), unless clinically contraindicated. CRITICAL RESULT CALLED TO, READ BACK BY AND VERIFIED WITH: L SEAY PHARMD 04/24/20 2317 JDW    Methicillin resistance NOT DETECTED NOT DETECTED Final   Streptococcus species NOT DETECTED NOT DETECTED Final   Streptococcus agalactiae NOT DETECTED NOT DETECTED Final   Streptococcus pneumoniae NOT DETECTED NOT DETECTED Final   Streptococcus pyogenes NOT DETECTED NOT DETECTED Final   Acinetobacter baumannii NOT DETECTED NOT DETECTED Final   Enterobacteriaceae species NOT DETECTED NOT DETECTED Final   Enterobacter cloacae complex NOT DETECTED NOT DETECTED Final   Escherichia coli NOT DETECTED NOT DETECTED Final   Klebsiella oxytoca NOT DETECTED  NOT DETECTED Final   Klebsiella pneumoniae NOT DETECTED NOT DETECTED Final   Proteus species NOT DETECTED NOT DETECTED Final   Serratia marcescens NOT DETECTED NOT DETECTED Final   Haemophilus influenzae NOT DETECTED NOT DETECTED Final   Neisseria meningitidis NOT DETECTED NOT DETECTED Final   Pseudomonas aeruginosa NOT DETECTED NOT DETECTED Final   Candida albicans NOT DETECTED NOT DETECTED Final   Candida glabrata NOT DETECTED NOT DETECTED Final   Candida krusei NOT DETECTED NOT DETECTED Final   Candida parapsilosis NOT DETECTED NOT DETECTED Final   Candida tropicalis NOT DETECTED NOT DETECTED Final    Comment: Performed at [facility], [address] [place], [place] [address]  Blood culture (routine x 2)     Status: None (Preliminary result)   Collection Time: 04/23/20  7:35 PM   Specimen: BLOOD RIGHT FOREARM  Result Value Ref Range Status   Specimen Description BLOOD RIGHT FOREARM  Final   Special Requests   Final    BOTTLES DRAWN AEROBIC AND ANAEROBIC Blood Culture results may not be optimal due to an inadequate volume of blood received in culture bottles   Culture   Final    NO GROWTH 4 DAYS Performed at [facility], [address] [place], [place] [address]    Report Status PENDING  Incomplete  Aerobic/Anaerobic Culture (surgical/deep wound)     Status: None (Preliminary result)   Collection Time: 04/23/20 10:37 PM   Specimen: Abscess  Result Value Ref Range Status   Specimen Description ABSCESS  Final   Special Requests THORACOULMBAR  Final   Gram Stain   Final    FEW WBC PRESENT, PREDOMINANTLY PMN FEW GRAM POSITIVE COCCI CRITICAL RESULT CALLED TO, READ BACK BY AND VERIFIED WITH: RN T ROWE @0107  04/24/20 BY S GEZAHEGN Performed at [facility], [address] [place], [place] [address]    Culture   Final    FEW STAPHYLOCOCCUS AUREUS NO ANAEROBES ISOLATED; CULTURE IN PROGRESS FOR 5 DAYS    Report Status PENDING  Incomplete   Organism ID,  Bacteria STAPHYLOCOCCUS AUREUS  Final      Susceptibility   Staphylococcus aureus - MIC*    CIPROFLOXACIN <=0.5 SENSITIVE Sensitive     ERYTHROMYCIN <=0.25 SENSITIVE Sensitive     GENTAMICIN <=0.5 SENSITIVE Sensitive     OXACILLIN <=0.25 SENSITIVE Sensitive     TETRACYCLINE <=1 SENSITIVE Sensitive     VANCOMYCIN <=0.5 SENSITIVE Sensitive     TRIMETH/SULFA <=10 SENSITIVE Sensitive     CLINDAMYCIN <=0.25 SENSITIVE Sensitive     RIFAMPIN <=0.5 SENSITIVE Sensitive     Inducible Clindamycin NEGATIVE Sensitive     * FEW STAPHYLOCOCCUS AUREUS  Aerobic/Anaerobic Culture (surgical/deep wound)     Status: None (Preliminary result)   Collection Time: 04/23/20 10:37 PM   Specimen: Soft Tissue, Other  Result Value Ref Range Status   Specimen Description ABSCESS  Final   Special Requests EPIDURAL  Final   Gram Stain   Final    NO WBC SEEN NO ORGANISMS SEEN Performed at [facility], [address] [place], [place] [address]    Culture   Final    FEW STAPHYLOCOCCUS AUREUS NO ANAEROBES ISOLATED; CULTURE IN PROGRESS FOR 5 DAYS    Report Status PENDING  Incomplete   Organism ID, Bacteria STAPHYLOCOCCUS AUREUS  Final      Susceptibility   Staphylococcus aureus - MIC*    CIPROFLOXACIN <=0.5 SENSITIVE Sensitive     ERYTHROMYCIN <=0.25 SENSITIVE Sensitive     GENTAMICIN <=0.5 SENSITIVE Sensitive     OXACILLIN <=0.25 SENSITIVE Sensitive     TETRACYCLINE <=1 SENSITIVE Sensitive     VANCOMYCIN <=0.5 SENSITIVE Sensitive     TRIMETH/SULFA <=10 SENSITIVE Sensitive     CLINDAMYCIN <=0.25 SENSITIVE Sensitive     RIFAMPIN <=0.5 SENSITIVE Sensitive     Inducible Clindamycin NEGATIVE Sensitive     * FEW STAPHYLOCOCCUS AUREUS  MRSA PCR Screening     Status: None   Collection Time: 04/24/20  1:50 AM   Specimen: Nasal Mucosa; Nasopharyngeal  Result Value Ref Range Status   MRSA by PCR NEGATIVE NEGATIVE Final    Comment:        The GeneXpert MRSA Assay (FDA approved for NASAL specimens only),  is one component of a comprehensive MRSA colonization surveillance program. It is not intended to diagnose MRSA infection nor to guide or monitor treatment for MRSA infections. Performed at [facility], [address] [place], [place] [address]   Culture, blood (routine x 2)     Status: None (Preliminary result)   Collection Time: 04/24/20  8:20 AM   Specimen: BLOOD LEFT ARM  Result Value Ref Range Status   Specimen Description BLOOD LEFT ARM  Final   Special Requests   Final  BOTTLES DRAWN AEROBIC ONLY Blood Culture adequate volume   Culture   Final    NO GROWTH 3 DAYS Performed at [facility], [address] [place], [place] [address]    Report Status PENDING  Incomplete  Culture, blood (routine x 2)     Status: None (Preliminary result)   Collection Time: 04/24/20  8:20 AM   Specimen: BLOOD LEFT ARM  Result Value Ref Range Status   Specimen Description BLOOD LEFT ARM  Final   Special Requests   Final    BOTTLES DRAWN AEROBIC ONLY Blood Culture results may not be optimal due to an inadequate volume of blood received in culture bottles   Culture   Final    NO GROWTH 3 DAYS Performed at [facility], [address] [place], [place] [address]    Report Status PENDING  Incomplete  Culture, blood (routine x 2)     Status: None (Preliminary result)   Collection Time: 04/25/20  7:08 AM   Specimen: BLOOD  Result Value Ref Range Status   Specimen Description BLOOD RIGHT ANTECUBITAL  Final   Special Requests   Final    BOTTLES DRAWN AEROBIC AND ANAEROBIC Blood Culture adequate volume   Culture   Final    NO GROWTH 2 DAYS Performed at [facility], [address], [place], [place] [address]    Report Status PENDING  Incomplete  Culture, blood (routine x 2)     Status: None (Preliminary result)   Collection Time: 04/25/20  7:14 AM   Specimen: BLOOD  Result Value Ref Range Status   Specimen Description BLOOD LEFT ANTECUBITAL  Final   Special  Requests   Final    BOTTLES DRAWN AEROBIC AND ANAEROBIC Blood Culture adequate volume   Culture   Final    NO GROWTH 2 DAYS Performed at [facility], [address] [place], [place] [address]    Report Status PENDING  Incomplete  Surgical PCR screen     Status: None   Collection Time: 04/25/20  7:54 AM   Specimen: Nasal Mucosa; Nasal Swab  Result Value Ref Range Status   MRSA, PCR NEGATIVE NEGATIVE Final   Staphylococcus aureus NEGATIVE NEGATIVE Final    Comment: (NOTE) The Xpert SA Assay (FDA approved for NASAL specimens in patients 75 years of age and older), is one component of a comprehensive surveillance program. It is not intended to diagnose infection nor to guide or monitor treatment. Performed at [facility], [address], [place], [place] [address]   Aerobic/Anaerobic Culture (surgical/deep wound)     Status: None (Preliminary result)   Collection Time: 04/25/20  4:44 PM   Specimen: Wound  Result Value Ref Range Status   Specimen Description WOUND  Final   Special Requests RT ACROMIOCLAVICULAR JOINT  Final   Gram Stain   Final    FEW WBC PRESENT, PREDOMINANTLY PMN NO ORGANISMS SEEN Performed at [facility], [address], [place], [place] [address]    Culture   Final    RARE STAPHYLOCOCCUS AUREUS SUSCEPTIBILITIES TO FOLLOW NO ANAEROBES ISOLATED; CULTURE IN PROGRESS FOR 5 DAYS    Report Status PENDING  Incomplete     Radiology Studies: [place] EKG SITE RITE  Result Date: 04/27/2020 If Site Rite image not attached, placement could not be confirmed due to current cardiac rhythm.   Scheduled Meds:  amLODipine  5 mg Oral Daily   Chlorhexidine Gluconate Cloth  6 each Topical Daily   docusate sodium  100 mg Oral BID   lisinopril  5 mg Oral Daily   oxyCODONE  15 mg Oral Q12H   pantoprazole (PROTONIX) IV  40 mg Intravenous QHS   potassium chloride  40 mEq Oral TID   sodium chloride flush  3 mL Intravenous Q12H   Continuous  Infusions:  sodium chloride     sodium chloride     0.9 % NaCl with KCl 20 mEq / L 80 mL/hr at 04/27/20 1329    ceFAZolin (ANCEF) IV 2 g (04/27/20 1224)   magnesium sulfate bolus IVPB     methocarbamol (ROBAXIN) IV 500 mg (04/27/20 0555)     LOS: 4 days   Rickey Barbara, MD [facility] Pager On Amion  If 7PM-7AM, please contact night-coverage 04/27/2020, 3:00 PM

## 2020-04-27 NOTE — Progress Notes (Signed)
Physical Therapy Treatment Patient Details Name: TERREA BRUSTER MRN: [id number] DOB: 12-03-1960 Today's Date: 04/27/2020    History of Present Illness Pt is a 59 y.o. female admitted on 04/23/20 for emergent decompression of throacolumbar epidural abcess, discitis L1/2 s/p laminectomy L1/2 and evacuation of abcess.  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 She is now s/p I&D and shoulder, AC joint repair. Infectious disease is following suspicious that both R shoulder and back are staph infections.  Blood cultures are pending.  Pt with no significant medical history listed in chart.    PT Comments    Pt making fair progress with mobility. Gait training initiated this session but pt unable to tolerate ambulating more than 2' with RW and min A. She continues to require physical assistance for bed mobility and transfers as well. Continue to recommend pt d/c to CIR for further intensive therapy services prior to d/c home with family support. Will continue to follow up with pt acutely to progress mobility as tolerated.    Follow Up Recommendations  CIR     Equipment Recommendations  3in1 (PT);Wheelchair (measurements PT);Wheelchair cushion (measurements PT)    Recommendations for Other Services       Precautions / Restrictions Precautions Precautions: Fall;Back Precaution Comments: verbally reviewed throughout Required Braces or Orthoses: Spinal Brace;Sling Spinal Brace: Lumbar corset;Applied in sitting position Restrictions Weight Bearing Restrictions: Yes RUE Weight Bearing: Partial weight bearing Other Position/Activity Restrictions: No overhead movement with RUE; sling for comfort    Mobility  Bed Mobility Overal bed mobility: Needs Assistance Bed Mobility: Rolling;Sidelying to Sit Rolling: Min assist Sidelying to sit: Mod assist       General bed mobility comments: min A with use of bed pad to roll; cues for log roll technique,  assist to guide LEs to EOB + support at trunk to attain sitting  Transfers Overall transfer level: Needs assistance Equipment used: Rolling walker (2 wheeled) Transfers: Sit to/from Stand Sit to Stand: Mod assist         General transfer comment: increased time and effort, cueing for safe hand placement and reminder to not WB fully through R UE; assistance needed to fully power into standing from EOB  Ambulation/Gait Ambulation/Gait assistance: Min assist Gait Distance (Feet): 2 Feet Assistive device: Rolling walker (2 wheeled) Gait Pattern/deviations: Step-through pattern;Decreased step length - right;Decreased step length - left;Decreased stride length Gait velocity: decreased   General Gait Details: pt with very slow, guarded steps; unable to tolerate more than 2' with RW, min A for balance   Stairs             Wheelchair Mobility    Modified Rankin (Stroke Patients Only)       Balance Overall balance assessment: Needs assistance Sitting-balance support: Feet supported Sitting balance-Leahy Scale: Fair     Standing balance support: Bilateral upper extremity supported Standing balance-Leahy Scale: Poor Standing balance comment: reliant on external support                            Cognition Arousal/Alertness: Awake/alert Behavior During Therapy: WFL for tasks assessed/performed Overall Cognitive Status: No family/caregiver present to determine baseline cognitive functioning Area of Impairment: Memory;Safety/judgement                     Memory: Decreased recall of precautions   Safety/Judgement: Decreased awareness of safety;Decreased awareness of deficits  Exercises      General Comments        Pertinent Vitals/Pain Pain Assessment: No/denies pain    Home Living                      Prior Function            PT Goals (current goals can now be found in the care plan section) Acute Rehab PT  Goals PT Goal Formulation: With patient Time For Goal Achievement: 05/08/20 Potential to Achieve Goals: Good Progress towards PT goals: Progressing toward goals    Frequency    Min 5X/week      PT Plan Current plan remains appropriate    Co-evaluation              AM-PAC PT "6 Clicks" Mobility   Outcome Measure  Help needed turning from your back to your side while in a flat bed without using bedrails?: A Little Help needed moving from lying on your back to sitting on the side of a flat bed without using bedrails?: A Lot Help needed moving to and from a bed to a chair (including a wheelchair)?: A Lot Help needed standing up from a chair using your arms (e.g., wheelchair or bedside chair)?: A Lot Help needed to walk in hospital room?: A Lot Help needed climbing 3-5 steps with a railing? : Total 6 Click Score: 12    End of Session Equipment Utilized During Treatment: Gait belt;Back brace Activity Tolerance: Patient limited by pain Patient left: in chair;with call bell/phone within reach;with chair alarm set Nurse Communication: Mobility status PT Visit Diagnosis: Muscle weakness (generalized) (M62.81);Difficulty in walking, not elsewhere classified (R26.2);Pain;Other symptoms and signs involving the nervous system (R29.898) Pain - part of body:  (back)     Time: 4562-5638 PT Time Calculation (min) (ACUTE ONLY): 17 min  Charges:  $Therapeutic Activity: 8-22 mins                     Anastasio Champion, DPT  Acute Rehabilitation Services Pager [phone] Office [place] 04/27/2020, 12:05 PM

## 2020-04-27 NOTE — Progress Notes (Signed)
Patient ID: Debra Schmidt, female   DOB: 1961/05/21, 59 y.o.   MRN: [id number] BP 130/75 (BP Location: Left Arm)   Pulse (!) 112   Temp 98.3 F (36.8 C) (Oral)   Resp 18   Ht 5\' 4"  (1.626 m)   Wt 62.6 kg   SpO2 95%   BMI 23.69 kg/m  Alert and oriented x 4, speech is clear and fluent Moving all extremities Lumbar dressing is clean, and dry. No signs of wound infection.  Plan: ID service will have to sign their orders for IV medications since it is their prescription and it is their service which will follow the treatment results.  PT is recommending CIR and family has agreed.  Has improved overall

## 2020-04-27 NOTE — Progress Notes (Signed)
Inpatient Rehabilitation-Admissions Coordinator   Followed up with pt this afternoon. She said her and her husband have discussed the program and they would like to proceed with CIR, despite potential cost as she is uninsured. Left voicemail for FC to follow up for screening purposes. Will continue to follow for medical readiness.   Cheri Rous, OTR/L  Rehab Admissions Coordinator  [phone] 04/27/2020 4:50 PM

## 2020-04-27 NOTE — Progress Notes (Signed)
The patient's blood pressures have consistently been greater than the SBP goal of . Several doses of PRN metoprolol have been administered and SBP still remains >160. Additionally, the patient is crying out in pain despite having tried multiple PRNs for pain control along with an ice pack and repositioning. On-call provider notified and new orders received for PRN hydralazine and robaxin.

## 2020-04-28 DIAGNOSIS — F17210 Nicotine dependence, cigarettes, uncomplicated: Secondary | ICD-10-CM

## 2020-04-28 LAB — COMPREHENSIVE METABOLIC PANEL
ALT: 8 U/L (ref 0–44)
AST: 17 U/L (ref 15–41)
Albumin: 1.8 g/dL — ABNORMAL LOW (ref 3.5–5.0)
Alkaline Phosphatase: 74 U/L (ref 38–126)
Anion gap: 11 (ref 5–15)
BUN: <5 mg/dL — ABNORMAL LOW (ref 6–20)
CO2: 25 mmol/L (ref 22–32)
Calcium: 8 mg/dL — ABNORMAL LOW (ref 8.9–10.3)
Chloride: 98 mmol/L (ref 98–111)
Creatinine, Ser: 0.55 mg/dL (ref 0.44–1.00)
GFR calc Af Amer: >60 mL/min (ref >60)
GFR calc non Af Amer: >60 mL/min (ref >60)
Glucose, Bld: 105 mg/dL — ABNORMAL HIGH (ref 70–99)
Potassium: 3.2 mmol/L — ABNORMAL LOW (ref 3.5–5.1)
Sodium: 134 mmol/L — ABNORMAL LOW (ref 135–145)
Total Bilirubin: 0.3 mg/dL (ref 0.3–1.2)
Total Protein: 6.4 g/dL — ABNORMAL LOW (ref 6.5–8.1)

## 2020-04-28 LAB — CULTURE, BLOOD (ROUTINE X 2): Culture: NO GROWTH

## 2020-04-28 LAB — MAGNESIUM: Magnesium: 1.8 mg/dL (ref 1.7–2.4)

## 2020-04-28 MED ORDER — POTASSIUM CHLORIDE CRYS ER 20 MEQ PO TBCR
60.0000 meq | EXTENDED_RELEASE_TABLET | Freq: Two times a day (BID) | ORAL | Status: AC
  Administered 2020-04-28 (×2): 60 meq via ORAL
  Filled 2020-04-28 (×2): qty 3

## 2020-04-28 MED ORDER — MAGNESIUM SULFATE 2 GM/50ML IV SOLN
2.0000 g | Freq: Once | INTRAVENOUS | Status: AC
  Administered 2020-04-28: 2 g via INTRAVENOUS
  Filled 2020-04-28: qty 50

## 2020-04-28 MED ORDER — LISINOPRIL 10 MG PO TABS
10.0000 mg | ORAL_TABLET | Freq: Every day | ORAL | Status: DC
  Administered 2020-04-28 – 2020-05-09 (×12): 10 mg via ORAL
  Filled 2020-04-28 (×12): qty 1

## 2020-04-28 NOTE — Progress Notes (Signed)
Inpatient Rehabilitation-Admissions Coordinator   Met with pt bedside. Notified her that I do not have a bed available for her this weekend. Will follow up Monday for possible admit, pending medical readiness and bed availability.   Raechel Ache, OTR/L  Rehab Admissions Coordinator  [phone] 04/28/2020 4:13 PM

## 2020-04-28 NOTE — Progress Notes (Signed)
Physical Therapy Treatment Patient Details Name: Debra Schmidt MRN: [id number] DOB: [birth year] Today's Date: 04/28/2020    History of Present Illness Pt is a 59 y.o. female admitted on 04/23/20 for emergent decompression of throacolumbar epidural abcess, discitis L1/2 s/p laminectomy L1/2 and evacuation of abcess.  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 She is now s/p I&D and shoulder, AC joint repair. Infectious disease is following suspicious that both R shoulder and back are staph infections.  Blood cultures are pending.  Pt with no significant medical history listed in chart.    PT Comments    Pt confused/restless this AM, perseverating on pain.  She required mod assist bed mobility and max assist transfers. Heavy lean to R noted in sitting. Pt reporting numbness BLE. Unable to achieve full upright stance. Pt in recliner at end of session. Pt continually asking to remove lumbar corset. Educated on need to wear brace when OOB. Pt tachy with HR range 125-142.   Follow Up Recommendations  CIR     Equipment Recommendations  3in1 (PT);Wheelchair (measurements PT);Wheelchair cushion (measurements PT)    Recommendations for Other Services Rehab consult     Precautions / Restrictions Precautions Precautions: Fall;Back Precaution Comments: reviewed 3/3 back precautions. Cues for adherence during mobility Required Braces or Orthoses: Spinal Brace;Sling Spinal Brace: Lumbar corset;Applied in sitting position Restrictions Weight Bearing Restrictions: Yes RUE Weight Bearing: Partial weight bearing Other Position/Activity Restrictions: No overhead movement with RUE; sling for comfort    Mobility  Bed Mobility Overal bed mobility: Needs Assistance Bed Mobility: Rolling;Sidelying to Sit Rolling: Min assist Sidelying to sit: Mod assist       General bed mobility comments: +rail, max cues for sequencing/precautions, increased  time  Transfers Overall transfer level: Needs assistance Equipment used: Rolling walker (2 wheeled) Transfers: Sit to/from [employer] Sit to Stand: Max assist Stand pivot transfers: Max assist       General transfer comment: Max assist sit to stand from EOB with RW x 2 trials. Pt able to clear bottom from bed but unable to achieve full upright stance. RW removed and squat pivot transfer performed bed to recliner.  Ambulation/Gait             General Gait Details: unable   Stairs             Wheelchair Mobility    Modified Rankin (Stroke Patients Only)       Balance Overall balance assessment: Needs assistance Sitting-balance support: Feet supported;Bilateral upper extremity supported Sitting balance-Leahy Scale: Poor Sitting balance - Comments: heavy lean/push to the R. Mod assist to maintain balance EOB. Postural control: Right lateral lean   Standing balance-Leahy Scale: Poor Standing balance comment: reliant on external support                            Cognition Arousal/Alertness: Awake/alert Behavior During Therapy: Restless;Impulsive Overall Cognitive Status: No family/caregiver present to determine baseline cognitive functioning Area of Impairment: Memory;Safety/judgement;Attention;Awareness;Following commands;Problem solving                   Current Attention Level: Sustained Memory: Decreased recall of precautions;Decreased short-term memory Following Commands: Follows one step commands with increased time;Follows one step commands inconsistently Safety/Judgement: Decreased awareness of safety;Decreased awareness of deficits Awareness: Emergent Problem Solving: Slow processing;Difficulty sequencing;Requires verbal cues General Comments: Difficulty staying on task. Perseverating on pain.      Exercises  General Comments General comments (skin integrity, edema, etc.): Tachy throughout session. HR  125-142      Pertinent Vitals/Pain Pain Assessment: 0-10 Pain Score: 10-Worst pain ever Pain Location: back Pain Descriptors / Indicators: Grimacing;Guarding;Restless Pain Intervention(s): Limited activity within patient's tolerance;Repositioned;Monitored during session;Premedicated before session    Home Living                      Prior Function            PT Goals (current goals can now be found in the care plan section) Acute Rehab PT Goals Patient Stated Goal: retun to independence Progress towards PT goals: Progressing toward goals    Frequency    Min 5X/week      PT Plan Current plan remains appropriate    Co-evaluation              AM-PAC PT "6 Clicks" Mobility   Outcome Measure  Help needed turning from your back to your side while in a flat bed without using bedrails?: A Little Help needed moving from lying on your back to sitting on the side of a flat bed without using bedrails?: A Lot Help needed moving to and from a bed to a chair (including a wheelchair)?: A Lot Help needed standing up from a chair using your arms (e.g., wheelchair or bedside chair)?: A Lot Help needed to walk in hospital room?: A Lot Help needed climbing 3-5 steps with a railing? : Total 6 Click Score: 12    End of Session Equipment Utilized During Treatment: Gait belt;Back brace Activity Tolerance: Patient limited by pain Patient left: in chair;with call bell/phone within reach;with chair alarm set Nurse Communication: Mobility status;Patient requests pain meds PT Visit Diagnosis: Muscle weakness (generalized) (M62.81);Difficulty in walking, not elsewhere classified (R26.2);Pain;Other symptoms and signs involving the nervous system (K91.791)     Time: 5056-9794 PT Time Calculation (min) (ACUTE ONLY): 19 min  Charges:  $Therapeutic Activity: 8-22 mins                     Lorrin Goodell, PT  Office # [phone] Pager [phone]    Lorriane Shire 04/28/2020,  9:24 AM

## 2020-04-28 NOTE — Progress Notes (Signed)
Physical Therapy Treatment Patient Details Name: Debra Schmidt MRN: [id number] DOB: [birth year] Today's Date: 04/28/2020    History of Present Illness Pt is a 59 y.o. female admitted on 04/23/20 for emergent decompression of throacolumbar epidural abcess, discitis L1/2 s/p laminectomy L1/2 and evacuation of abcess.  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 She is now s/p I&D and shoulder, AC joint repair. Infectious disease is following suspicious that both R shoulder and back are staph infections.  Blood cultures are pending.  Pt with no significant medical history listed in chart.    PT Comments    PT enters room as pt calling out for help, appearing to attempt to leave chair without assistance upon PT arrival. Pt demonstrates limited recall of WB precautions and back precautions during session. Pt has limited safety awareness at this time and is generally weak, greatly increasing her falls risk. Pt currently requires physical assistance to perform all functional mobility tasks due to weakness and imbalance. Pt will benefit from continued acute PT POC to reduce falls risk and to aide in a return to independent mobility. PT continues to recommend CIR placement at this time however the pt will need to demonstrate improved activity tolerance to remain a good candidate.   Follow Up Recommendations  CIR     Equipment Recommendations  3in1 (PT);Wheelchair (measurements PT);Wheelchair cushion (measurements PT)    Recommendations for Other Services Rehab consult     Precautions / Restrictions Precautions Precautions: Fall;Back Precaution Comments: reviewed back precautions, pt able to recall 1/3 this session. PT provides further education and reinforcement Required Braces or Orthoses: Spinal Brace;Sling Spinal Brace: Lumbar corset Restrictions Weight Bearing Restrictions: Yes RUE Weight Bearing: Partial weight bearing RUE Partial Weight Bearing  Percentage or Pounds: per ortho note no strenuous movement Other Position/Activity Restrictions: No overhead movement with RUE; sling for comfort    Mobility  Bed Mobility Overal bed mobility: Needs Assistance Bed Mobility: Rolling;Sit to Sidelying Rolling: Min assist Sidelying to sit: Mod assist     Sit to sidelying: Min assist General bed mobility comments: cues to maintain back precautions  Transfers Overall transfer level: Needs assistance Equipment used: Rolling walker (2 wheeled);1 person hand held assist Transfers: Sit to/from [employer] Sit to Stand: Mod assist Stand pivot transfers: Mod assist       General transfer comment: modA sit to stand with RW, then sit to stand and stand pivot with modA with PT hand hold to reduce WB through RUE  Ambulation/Gait Ambulation/Gait assistance: Mod assist Gait Distance (Feet): 2 Feet Assistive device: Rolling walker (2 wheeled) Gait Pattern/deviations: Step-to pattern Gait velocity: reduced Gait velocity interpretation: <1.31 ft/sec, indicative of household ambulator General Gait Details: pt with short shuffling steps, increased knee flexion   Stairs             Wheelchair Mobility    Modified Rankin (Stroke Patients Only)       Balance Overall balance assessment: Needs assistance Sitting-balance support: Single extremity supported;Feet supported Sitting balance-Leahy Scale: Fair Sitting balance - Comments: close supervision for static sitting balance at edge of bed, requires UE support of bed Postural control: Right lateral lean Standing balance support: Bilateral upper extremity supported Standing balance-Leahy Scale: Poor Standing balance comment: min-modA for static standing balance                            Cognition Arousal/Alertness: Awake/alert Behavior During Therapy: Restless;Impulsive  Overall Cognitive Status: No family/caregiver present to determine baseline cognitive  functioning Area of Impairment: Memory;Safety/judgement;Following commands;Awareness;Problem solving                   Current Attention Level: Sustained Memory: Decreased recall of precautions;Decreased short-term memory Following Commands: Follows one step commands consistently Safety/Judgement: Decreased awareness of safety;Decreased awareness of deficits Awareness: Emergent Problem Solving: Slow processing;Requires verbal cues General Comments: Difficulty staying on task. Perseverating on pain.      Exercises      General Comments General comments (skin integrity, edema, etc.): tachy in 110s at rest and up to 120s with mobility      Pertinent Vitals/Pain Pain Assessment: 0-10 Pain Score: 10-Worst pain ever Pain Location: back and shoulder Pain Descriptors / Indicators: Grimacing;Guarding;Restless Pain Intervention(s): Limited activity within patient's tolerance;Patient requesting pain meds-RN notified    Home Living                      Prior Function            PT Goals (current goals can now be found in the care plan section) Acute Rehab PT Goals Patient Stated Goal: retun to independence Progress towards PT goals: Not progressing toward goals - comment (limited by pain)    Frequency    Min 5X/week      PT Plan Current plan remains appropriate    Co-evaluation              AM-PAC PT "6 Clicks" Mobility   Outcome Measure  Help needed turning from your back to your side while in a flat bed without using bedrails?: A Little Help needed moving from lying on your back to sitting on the side of a flat bed without using bedrails?: A Lot Help needed moving to and from a bed to a chair (including a wheelchair)?: A Lot Help needed standing up from a chair using your arms (e.g., wheelchair or bedside chair)?: A Lot Help needed to walk in hospital room?: A Lot Help needed climbing 3-5 steps with a railing? : Total 6 Click Score: 12    End  of Session Equipment Utilized During Treatment: Back brace Activity Tolerance: Patient limited by pain Patient left: in bed;with call bell/phone within reach;with bed alarm set Nurse Communication: Mobility status;Patient requests pain meds PT Visit Diagnosis: Muscle weakness (generalized) (M62.81);Difficulty in walking, not elsewhere classified (R26.2);Pain;Other symptoms and signs involving the nervous system (R29.898) Pain - Right/Left: Right Pain - part of body: Shoulder (also back)     Time: 8469-6295 PT Time Calculation (min) (ACUTE ONLY): 14 min  Charges:  $Therapeutic Activity: 8-22 mins                     Zenaida Niece, PT, DPT Acute Rehabilitation Pager: [phone]    Zenaida Niece 04/28/2020, 9:38 AM

## 2020-04-28 NOTE — Progress Notes (Signed)
PROGRESS NOTE    Debra Schmidt  VHQ:[id number] DOB: 1961/05/08 DOA: 04/23/2020 PCP: Patient, No Pcp Per    Brief Narrative:  59 y.o.female with remote history of Xanax and hydrocodone use presented to the ED 6/13 , she states that she fell in the bathtub several weeks ago injuring her right shoulder, eventually developed a draining wound on the top of her shoulder. She began to have progressively more severe back pain left urinary incontinence leading to admission 6/13. MRI showed L1-2 discitis with thoracolumbar abscess. She underwent emergent surgery by Dr. 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 ID to f/u in clinic next month -Underwent L1/2 laminectomy, epidural abscess evacuation on 6/13 -Follow-up OR cultures, anticipate MSSA -Pt underwent I&D and distal clavicle resection 6/15 -PT OT recs for CIR, possible in the next 1-2 days  Chronic hypokalemia and hypomagnesemia -Etiology not clear -Chart reviewed. Pt has hx of profound hypokalemia, first documented here on 02/24/20 with a K of 2.7 -She recalls being followed for hypokalemia when she was following up with her PCP over 5 years ago. Currently not seeing PCP -random cortisol level normal -Pt has required multiple doses of potassium replacement -Slowly improving, currently 3.2  this AM. Would continue to replace as tolerated -Have replaced Mg  Acute on chronic anemia -The setting of severe infections and surgery -Cont to monitor  Hypertension -BP trends somewhat improved since starting low dose lisinopril -have ordered PRN lopressor IV -will increase lisinopril to   Tachycardia -Likely related to post-op pain. Patient reports marked pain, described as cramping, corresponding to times of documented tachycardia -When seen this AM, HR around 100 with RR around 19-20, pt appearing comfortable -Denies chest pain -Recommend better pain control  DVT prophylaxis: SCD's Code Status: Full Family Communication: Pt in room, family not at bedside  Consultants:   Orthopedic surgery  ID  Procedures:  PRE-OPERATIVE DIAGNOSIS: Epidural Mass  POST-OPERATIVE DIAGNOSIS: Epidural Abscess, L1/2 discitis  PROCEDURE: Procedure(s): Lumbar One-Two LAMINECTOMY for Epidural Abscess Evacuation  SURGEON:Surgeon(s):  Coletta Memos, MD   Antimicrobials: Anti-infectives (From admission, onward)   Start     Dose/Rate Route Frequency Ordered Stop   04/25/20 1641  vancomycin (VANCOCIN) powder  Status:  Discontinued          As needed 04/25/20 1641 04/25/20 1657   04/25/20 1200  ceFAZolin (ANCEF) IVPB 2g/100 mL premix     Discontinue     2 g 200 mL/hr over 30 Minutes Intravenous Every 8 hours 04/25/20 0930 06/03/20 2359   04/24/20 1200  vancomycin (VANCOREADY) IVPB 750 mg/150 mL  Status:  Discontinued        750 mg 150 mL/hr over 60 Minutes Intravenous Every 12 hours 04/24/20 0156 04/25/20 0930   04/24/20 0800  piperacillin-tazobactam (ZOSYN) IVPB 3.375 g  Status:  Discontinued        3.375 g 12.5 mL/hr over 240 Minutes  Intravenous Every 8 hours 04/24/20 0156 04/24/20 1423   04/23/20 2215  piperacillin-tazobactam (ZOSYN) IVPB 3.375 g        3.375 g 100 mL/hr over 30 Minutes Intravenous  Once 04/23/20 2202 04/24/20 0031   04/23/20 2215  vancomycin  (VANCOREADY) IVPB 1250 mg/250 mL        1,250 mg 166.7 mL/hr over 90 Minutes Intravenous  Once 04/23/20 2202 04/24/20 0101      Subjective: Without complaints this AM  Objective: Vitals:   04/28/20 0412 04/28/20 0806 04/28/20 1130 04/28/20 1550  BP: (!) 158/84 137/88 (!) 147/92 (!) 151/85  Pulse: 97  (!) 104 99  Resp: (!) 32 (!) 23 (!) 21 (!) 26  Temp: 99.3 F (37.4 C) 97.6 F (36.4 C) 98.3 F (36.8 C) 98 F (36.7 C)  TempSrc: Oral Oral Oral Oral  SpO2: 97% 97% 98% 98%  Weight:      Height:        Intake/Output Summary (Last 24 hours) at 04/28/2020 1555 Last data filed at 04/28/2020 1551 Gross per 24 hour  Intake 100 ml  Output 2775 ml  Net -2675 ml   Filed Weights   04/23/20 1332  Weight: 62.6 kg    Examination: General exam: Conversant, in no acute distress Respiratory system: normal chest rise, clear, no audible wheezing Cardiovascular system: regular rhythm, s1-s2, tachycardic Gastrointestinal system: Nondistended, nontender, pos BS Central nervous system: No seizures, no tremors Extremities: No cyanosis, no joint deformities Skin: No rashes, no pallor Psychiatry: Affect normal // no auditory hallucinations   Data Reviewed: I have personally reviewed following labs and imaging studies  CBC: Recent Labs  Lab 04/23/20 1339 04/23/20 2056 04/23/20 2356 04/24/20 0605 04/25/20 0754 04/25/20 1402 04/26/20 0630  WBC 9.1  --   --  11.3* 14.2*  --  16.2*  HGB 10.4*   < > 8.5* 8.7* 8.9* 9.2* 8.9*  HCT 32.2*   < > 25.0* 27.3* 27.6* 27.0* 27.5*  MCV 84.7  --   --  84.8 84.7  --  83.6  PLT 407*  --   --  366 347  --  379   < > = values in this interval not displayed.   Basic Metabolic Panel: Recent Labs  Lab 04/23/20 1339 04/23/20 2054 04/24/20 0605 04/24/20 0605 04/24/20 1039 04/25/20 0528 04/25/20 0754 04/25/20 0754 04/25/20 1402 04/26/20 0630 04/26/20 1656 04/27/20 0949 04/28/20 0522  NA 140   < > 140   < >  --   --  140   < > 141 136 138 138  134*  K 2.0*   < > 2.2*   < >  --   --  2.4*   < > 2.7* 2.5* 4.1 2.9* 3.2*  CL 97*   < > 101   < >  --   --  101   < > 97* 97* 100 101 98  CO2 27   < > 29   < >  --   --  28  --   --  29 28 27 25   GLUCOSE 143*   < > 120*   < >  --   --  119*   < > 103* 97 96 112* 105*  BUN 5*   < > <5*   < >  --   --  <5*   < > 4* <5* 5* 5* <5*  CREATININE 0.79   < > 0.69   < >  --   --  0.55   < > 0.40* 0.50 0.55 0.56 0.55  CALCIUM 9.4   < > 8.8*   < >  --   --  8.5*  --   --  8.0* 8.2* 8.2* 8.0*  MG 1.6*  --  1.8  --   --   --   --   --   --   --   --  1.3* 1.8  PHOS  --   --   --   --  3.8 2.3*  --   --   --   --   --   --   --    < > = values in this interval not displayed.   GFR: Estimated Creatinine Clearance: 65.4 mL/min (by C-G formula based on SCr of 0.55 mg/dL). Liver Function Tests: Recent Labs  Lab 04/23/20 1339 04/24/20 0605 04/27/20 0949 04/28/20 0522  AST 19 18 15 17   ALT 13 11 9 8   ALKPHOS 74 64 71 74  BILITOT 0.6 0.7 0.6 0.3  PROT 7.7 6.4* 6.4* 6.4*  ALBUMIN 2.4* 1.9* 1.8* 1.8*   Recent Labs  Lab 04/23/20 1339  LIPASE 25   No results for input(s): AMMONIA in the last 168 hours. Coagulation Profile: No results for input(s): INR, PROTIME in the last 168 hours. Cardiac Enzymes: No results for input(s): CKTOTAL, CKMB, CKMBINDEX, TROPONINI in the last 168 hours. BNP (last 3 results) No results for input(s): PROBNP in the last 8760 hours. HbA1C: No results for input(s): HGBA1C in the last 72 hours. CBG: Recent Labs  Lab 04/24/20 0550  GLUCAP 109*   Lipid Profile: No results for input(s): CHOL, HDL, LDLCALC, TRIG, CHOLHDL, LDLDIRECT in the last 72 hours. Thyroid Function Tests: No results for input(s): TSH, T4TOTAL, FREET4, T3FREE, THYROIDAB in the last 72 hours. Anemia Panel: No results for input(s): VITAMINB12, FOLATE, FERRITIN, TIBC, IRON, RETICCTPCT in the last 72 hours. Sepsis Labs: No results for input(s): PROCALCITON, LATICACIDVEN in the last 168  hours.  Recent Results (from the past 240 hour(s))  SARS Coronavirus 2 by RT PCR (hospital order, performed in [facility] hospital lab) Nasopharyngeal Nasopharyngeal Swab     Status: None   Collection Time: 04/23/20  6:51 PM   Specimen: Nasopharyngeal Swab  Result Value Ref Range Status   SARS Coronavirus 2 NEGATIVE NEGATIVE Final    Comment: (NOTE) SARS-CoV-2 target nucleic acids are NOT DETECTED.  The SARS-CoV-2 RNA is generally detectable in upper and lower respiratory specimens during the acute phase of infection. The lowest concentration of SARS-CoV-2 viral copies this assay can detect is 250 copies / mL. A negative result does not preclude SARS-CoV-2 infection and should not be used as the sole basis for treatment or other patient management decisions.  A negative result may occur with improper specimen collection / handling, submission of specimen other than nasopharyngeal swab, presence of viral mutation(s) within the areas targeted by this assay, and inadequate number of viral copies (<250 copies / mL). A negative result must be combined with clinical observations, patient history, and epidemiological information.  Fact Sheet for Patients:   [facility]  Fact Sheet for Healthcare Providers: 04/25/20  This test is not yet approved or  cleared by the [employer] FDA and has been authorized for detection and/or diagnosis of SARS-CoV-2 by FDA under an Emergency Use Authorization (EUA).  This EUA will remain in effect (meaning this test can be used) for the duration of the COVID-19 declaration under Section 564(b)(1) of the Act, 21  U.S.C. section 360bbb-3(b)(1), unless the authorization is terminated or revoked sooner.  Performed at [facility], [address] [place], [place] [address]   Blood culture (routine x 2)     Status: Abnormal   Collection Time: 04/23/20  7:30 PM   Specimen: BLOOD   Result Value [facility] Range Status   Specimen Description BLOOD RIGHT ANTECUBITAL  Final   Special Requests   Final    BOTTLES DRAWN AEROBIC AND ANAEROBIC Blood Culture adequate volume   Culture  Setup Time   Final    AEROBIC BOTTLE ONLY GRAM POSITIVE COCCI IN CLUSTERS CRITICAL RESULT CALLED TO, READ BACK BY AND VERIFIED WITH: Tori MilksL SEAY [facility] 04/24/20 2317 JDW Performed at [facility], [address] [place], [place] [address]    Culture STAPHYLOCOCCUS AUREUS (A)  Final   Report Status 04/26/2020 FINAL  Final   Organism ID, Bacteria STAPHYLOCOCCUS AUREUS  Final      Susceptibility   Staphylococcus aureus - MIC*    CIPROFLOXACIN <=0.5 SENSITIVE Sensitive     ERYTHROMYCIN <=0.25 SENSITIVE Sensitive     GENTAMICIN <=0.5 SENSITIVE Sensitive     OXACILLIN <=0.25 SENSITIVE Sensitive     TETRACYCLINE <=1 SENSITIVE Sensitive     VANCOMYCIN <=0.5 SENSITIVE Sensitive     TRIMETH/SULFA <=10 SENSITIVE Sensitive     CLINDAMYCIN <=0.25 SENSITIVE Sensitive     RIFAMPIN <=0.5 SENSITIVE Sensitive     Inducible Clindamycin NEGATIVE Sensitive     * STAPHYLOCOCCUS AUREUS  Blood Culture ID Panel (Reflexed)     Status: Abnormal   Collection Time: 04/23/20  7:30 PM  Result Value Ref Range Status   Enterococcus species NOT DETECTED NOT DETECTED Final   Listeria monocytogenes NOT DETECTED NOT DETECTED Final   Staphylococcus species DETECTED (A) NOT DETECTED Final    Comment: CRITICAL RESULT CALLED TO, READ BACK BY AND VERIFIED WITH: L SEAY PHARMD 04/24/20 2317 JDW    Staphylococcus aureus (BCID) DETECTED (A) NOT DETECTED Final    Comment: Methicillin (oxacillin) susceptible Staphylococcus aureus (MSSA). Preferred therapy is anti staphylococcal beta lactam antibiotic (Cefazolin or Nafcillin), unless clinically contraindicated. CRITICAL RESULT CALLED TO, READ BACK BY AND VERIFIED WITH: L SEAY PHARMD 04/24/20 2317 JDW    Methicillin resistance NOT DETECTED NOT DETECTED Final   Streptococcus species NOT  DETECTED NOT DETECTED Final   Streptococcus agalactiae NOT DETECTED NOT DETECTED Final   Streptococcus pneumoniae NOT DETECTED NOT DETECTED Final   Streptococcus pyogenes NOT DETECTED NOT DETECTED Final   Acinetobacter baumannii NOT DETECTED NOT DETECTED Final   Enterobacteriaceae species NOT DETECTED NOT DETECTED Final   Enterobacter cloacae complex NOT DETECTED NOT DETECTED Final   Escherichia coli NOT DETECTED NOT DETECTED Final   Klebsiella oxytoca NOT DETECTED NOT DETECTED Final   Klebsiella pneumoniae NOT DETECTED NOT DETECTED Final   Proteus species NOT DETECTED NOT DETECTED Final   Serratia marcescens NOT DETECTED NOT DETECTED Final   Haemophilus influenzae NOT DETECTED NOT DETECTED Final   Neisseria meningitidis NOT DETECTED NOT DETECTED Final   Pseudomonas aeruginosa NOT DETECTED NOT DETECTED Final   Candida albicans NOT DETECTED NOT DETECTED Final   Candida glabrata NOT DETECTED NOT DETECTED Final   Candida krusei NOT DETECTED NOT DETECTED Final   Candida parapsilosis NOT DETECTED NOT DETECTED Final   Candida tropicalis NOT DETECTED NOT DETECTED Final    Comment: Performed at [facility], [address] [place], [place] [address]  Blood culture (routine x 2)  Status: None   Collection Time: 04/23/20  7:35 PM   Specimen: BLOOD RIGHT FOREARM  Result Value Ref Range Status   Specimen Description BLOOD RIGHT FOREARM  Final   Special Requests   Final    BOTTLES DRAWN AEROBIC AND ANAEROBIC Blood Culture results may not be optimal due to an inadequate volume of blood received in culture bottles   Culture   Final    NO GROWTH 5 DAYS Performed at [facility], [address], [place], [place] [address]    Report Status 04/28/2020 FINAL  Final  Aerobic/Anaerobic Culture (surgical/deep wound)     Status: None (Preliminary result)   Collection Time: 04/23/20 10:37 PM   Specimen: Abscess  Result Value Ref Range Status   Specimen Description ABSCESS  Final    Special Requests THORACOULMBAR  Final   Gram Stain   Final    FEW WBC PRESENT, PREDOMINANTLY PMN FEW GRAM POSITIVE COCCI CRITICAL RESULT CALLED TO, READ BACK BY AND VERIFIED WITH: RN T ROWE @0107  04/24/20 BY S GEZAHEGN Performed at [facility], [address], [place], [place]    Culture   Final    FEW STAPHYLOCOCCUS AUREUS NO ANAEROBES ISOLATED; CULTURE IN PROGRESS FOR 5 DAYS    Report Status PENDING  Incomplete   Organism ID, Bacteria STAPHYLOCOCCUS AUREUS  Final      Susceptibility   Staphylococcus aureus - MIC*    CIPROFLOXACIN <=0.5 SENSITIVE Sensitive     ERYTHROMYCIN <=0.25 SENSITIVE Sensitive     GENTAMICIN <=0.5 SENSITIVE Sensitive     OXACILLIN <=0.25 SENSITIVE Sensitive     TETRACYCLINE <=1 SENSITIVE Sensitive     VANCOMYCIN <=0.5 SENSITIVE Sensitive     TRIMETH/SULFA <=10 SENSITIVE Sensitive     CLINDAMYCIN <=0.25 SENSITIVE Sensitive     RIFAMPIN <=0.5 SENSITIVE Sensitive     Inducible Clindamycin NEGATIVE Sensitive     * FEW STAPHYLOCOCCUS AUREUS  Aerobic/Anaerobic Culture (surgical/deep wound)     Status: None (Preliminary result)   Collection Time: 04/23/20 10:37 PM   Specimen: Soft Tissue, Other  Result Value Ref Range Status   Specimen Description ABSCESS  Final   Special Requests EPIDURAL  Final   Gram Stain   Final    NO WBC SEEN NO ORGANISMS SEEN Performed at [facility], [address], [place], [place]    Culture   Final    FEW STAPHYLOCOCCUS AUREUS NO ANAEROBES ISOLATED; CULTURE IN PROGRESS FOR 5 DAYS    Report Status PENDING  Incomplete   Organism ID, Bacteria STAPHYLOCOCCUS AUREUS  Final      Susceptibility   Staphylococcus aureus - MIC*    CIPROFLOXACIN <=0.5 SENSITIVE Sensitive     ERYTHROMYCIN <=0.25 SENSITIVE Sensitive     GENTAMICIN <=0.5 SENSITIVE Sensitive     OXACILLIN <=0.25 SENSITIVE Sensitive     TETRACYCLINE <=1 SENSITIVE Sensitive     VANCOMYCIN <=0.5 SENSITIVE Sensitive     TRIMETH/SULFA <=10  SENSITIVE Sensitive     CLINDAMYCIN <=0.25 SENSITIVE Sensitive     RIFAMPIN <=0.5 SENSITIVE Sensitive     Inducible Clindamycin NEGATIVE Sensitive     * FEW STAPHYLOCOCCUS AUREUS  MRSA PCR Screening     Status: None   Collection Time: 04/24/20  1:50 AM   Specimen: Nasal Mucosa; Nasopharyngeal  Result Value Ref Range Status   MRSA by PCR NEGATIVE NEGATIVE Final    Comment:        The GeneXpert MRSA Assay (FDA  approved for NASAL specimens only), is one component of a comprehensive MRSA colonization surveillance program. It is not intended to diagnose MRSA infection nor to guide or monitor treatment for MRSA infections. Performed at [facility], [place] [address], [place], [place] [address]   Culture, blood (routine x 2)     Status: None (Preliminary result)   Collection Time: 04/24/20  8:20 AM   Specimen: BLOOD LEFT ARM  Result Value Ref Range Status   Specimen Description BLOOD LEFT ARM  Final   Special Requests   Final    BOTTLES DRAWN AEROBIC ONLY Blood Culture adequate volume   Culture   Final    NO GROWTH 4 DAYS Performed at [facility], [place] [address] [place], [place] [address]    Report Status PENDING  Incomplete  Culture, blood (routine x 2)     Status: None (Preliminary result)   Collection Time: 04/24/20  8:20 AM   Specimen: BLOOD LEFT ARM  Result Value Ref Range Status   Specimen Description BLOOD LEFT ARM  Final   Special Requests   Final    BOTTLES DRAWN AEROBIC ONLY Blood Culture results may not be optimal due to an inadequate volume of blood received in culture bottles   Culture   Final    NO GROWTH 4 DAYS Performed at [facility], [place] [address] [place], [place] [address]    Report Status PENDING  Incomplete  Culture, blood (routine x 2)     Status: None (Preliminary result)   Collection Time: 04/25/20  7:08 AM   Specimen: BLOOD  Result Value Ref Range Status   Specimen Description BLOOD RIGHT ANTECUBITAL  Final   Special Requests    Final    BOTTLES DRAWN AEROBIC AND ANAEROBIC Blood Culture adequate volume   Culture   Final    NO GROWTH 3 DAYS Performed at [facility], [place] [address], [place], [place] [address]    Report Status PENDING  Incomplete  Culture, blood (routine x 2)     Status: None (Preliminary result)   Collection Time: 04/25/20  7:14 AM   Specimen: BLOOD  Result Value Ref Range Status   Specimen Description BLOOD LEFT ANTECUBITAL  Final   Special Requests   Final    BOTTLES DRAWN AEROBIC AND ANAEROBIC Blood Culture adequate volume   Culture   Final    NO GROWTH 3 DAYS Performed at [facility], [place] [address] [place], [place] [address]    Report Status PENDING  Incomplete  Surgical PCR screen     Status: None   Collection Time: 04/25/20  7:54 AM   Specimen: Nasal Mucosa; Nasal Swab  Result Value Ref Range Status   MRSA, PCR NEGATIVE NEGATIVE Final   Staphylococcus aureus NEGATIVE NEGATIVE Final    Comment: (NOTE) The Xpert SA Assay (FDA approved for NASAL specimens in patients 35 years of age and older), is one component of a comprehensive surveillance program. It is not intended to diagnose infection nor to guide or monitor treatment. Performed at [facility], [place] [address] [place],  [address]   Aerobic/Anaerobic Culture (surgical/deep wound)     Status: None (Preliminary result)   Collection Time: 04/25/20  4:44 PM   Specimen: Wound  Result Value Ref Range Status   Specimen Description WOUND  Final   Special Requests RT ACROMIOCLAVICULAR JOINT  Final   Gram Stain   Final    FEW WBC  PRESENT, PREDOMINANTLY PMN NO ORGANISMS SEEN Performed at [facility], [address] [place], [place] [address]    Culture   Final    RARE STAPHYLOCOCCUS AUREUS NO ANAEROBES ISOLATED; CULTURE IN PROGRESS FOR 5 DAYS    Report Status PENDING  Incomplete   Organism ID, Bacteria STAPHYLOCOCCUS AUREUS  Final      Susceptibility   Staphylococcus aureus - MIC*     CIPROFLOXACIN <=0.5 SENSITIVE Sensitive     ERYTHROMYCIN <=0.25 SENSITIVE Sensitive     GENTAMICIN <=0.5 SENSITIVE Sensitive     OXACILLIN <=0.25 SENSITIVE Sensitive     TETRACYCLINE <=1 SENSITIVE Sensitive     VANCOMYCIN <=0.5 SENSITIVE Sensitive     TRIMETH/SULFA <=10 SENSITIVE Sensitive     CLINDAMYCIN <=0.25 SENSITIVE Sensitive     RIFAMPIN <=0.5 SENSITIVE Sensitive     Inducible Clindamycin NEGATIVE Sensitive     * RARE STAPHYLOCOCCUS AUREUS     Radiology Studies: [place] EKG SITE RITE  Result Date: 04/27/2020 If Site Rite image not attached, placement could not be confirmed due to current cardiac rhythm.   Scheduled Meds: . amLODipine  5 mg Oral Daily  . Chlorhexidine Gluconate Cloth  6 each Topical Daily  . docusate sodium  100 mg Oral BID  . lisinopril  10 mg Oral Daily  . oxyCODONE  15 mg Oral Q12H  . pantoprazole (PROTONIX) IV  40 mg Intravenous QHS  . potassium chloride  60 mEq Oral BID  . sodium chloride flush  10-40 mL Intracatheter Q12H  . sodium chloride flush  3 mL Intravenous Q12H   Continuous Infusions: . sodium chloride    . sodium chloride    .  ceFAZolin (ANCEF) IV 2 g (04/28/20 1140)  . methocarbamol (ROBAXIN) IV 500 mg (04/28/20 0349)     LOS: 5 days   Rickey Barbara, MD [facility] Pager On Amion  If 7PM-7AM, please contact night-coverage 04/28/2020, 3:55 PM

## 2020-04-29 ENCOUNTER — Inpatient Hospital Stay (HOSPITAL_COMMUNITY): Payer: Medicaid Other

## 2020-04-29 DIAGNOSIS — M869 Osteomyelitis, unspecified: Secondary | ICD-10-CM

## 2020-04-29 LAB — COMPREHENSIVE METABOLIC PANEL
ALT: 8 U/L (ref 0–44)
AST: 18 U/L (ref 15–41)
Albumin: 1.7 g/dL — ABNORMAL LOW (ref 3.5–5.0)
Alkaline Phosphatase: 70 U/L (ref 38–126)
Anion gap: 12 (ref 5–15)
BUN: 5 mg/dL — ABNORMAL LOW (ref 6–20)
CO2: 22 mmol/L (ref 22–32)
Calcium: 8.3 mg/dL — ABNORMAL LOW (ref 8.9–10.3)
Chloride: 102 mmol/L (ref 98–111)
Creatinine, Ser: 0.57 mg/dL (ref 0.44–1.00)
GFR calc Af Amer: >60 mL/min (ref >60)
GFR calc non Af Amer: >60 mL/min (ref >60)
Glucose, Bld: 109 mg/dL — ABNORMAL HIGH (ref 70–99)
Potassium: 3.9 mmol/L (ref 3.5–5.1)
Sodium: 136 mmol/L (ref 135–145)
Total Bilirubin: 0.6 mg/dL (ref 0.3–1.2)
Total Protein: 6.2 g/dL — ABNORMAL LOW (ref 6.5–8.1)

## 2020-04-29 LAB — LIPID PANEL
Cholesterol: 105 mg/dL (ref 0–200)
HDL: 14 mg/dL — ABNORMAL LOW (ref >40)
LDL Cholesterol: 53 mg/dL (ref 0–99)
Total CHOL/HDL Ratio: 7.5 RATIO
Triglycerides: 192 mg/dL — ABNORMAL HIGH (ref <150)
VLDL: 38 mg/dL (ref 0–40)

## 2020-04-29 LAB — AEROBIC/ANAEROBIC CULTURE W GRAM STAIN (SURGICAL/DEEP WOUND): Gram Stain: NONE SEEN

## 2020-04-29 LAB — CULTURE, BLOOD (ROUTINE X 2)
Culture: NO GROWTH
Culture: NO GROWTH
Special Requests: ADEQUATE

## 2020-04-29 LAB — LIPASE, BLOOD: Lipase: 140 U/L — ABNORMAL HIGH (ref 11–51)

## 2020-04-29 LAB — MAGNESIUM: Magnesium: 1.8 mg/dL (ref 1.7–2.4)

## 2020-04-29 LAB — LACTIC ACID, PLASMA: Lactic Acid, Venous: 0.7 mmol/L (ref 0.5–1.9)

## 2020-04-29 IMAGING — DX DG CHEST 1V PORT
1 series · 1 of 1 positions shown · non-contrast
Comparison: None.

CLINICAL DATA: Tachypnea.

EXAM:
PORTABLE CHEST 1 VIEW

[chest]
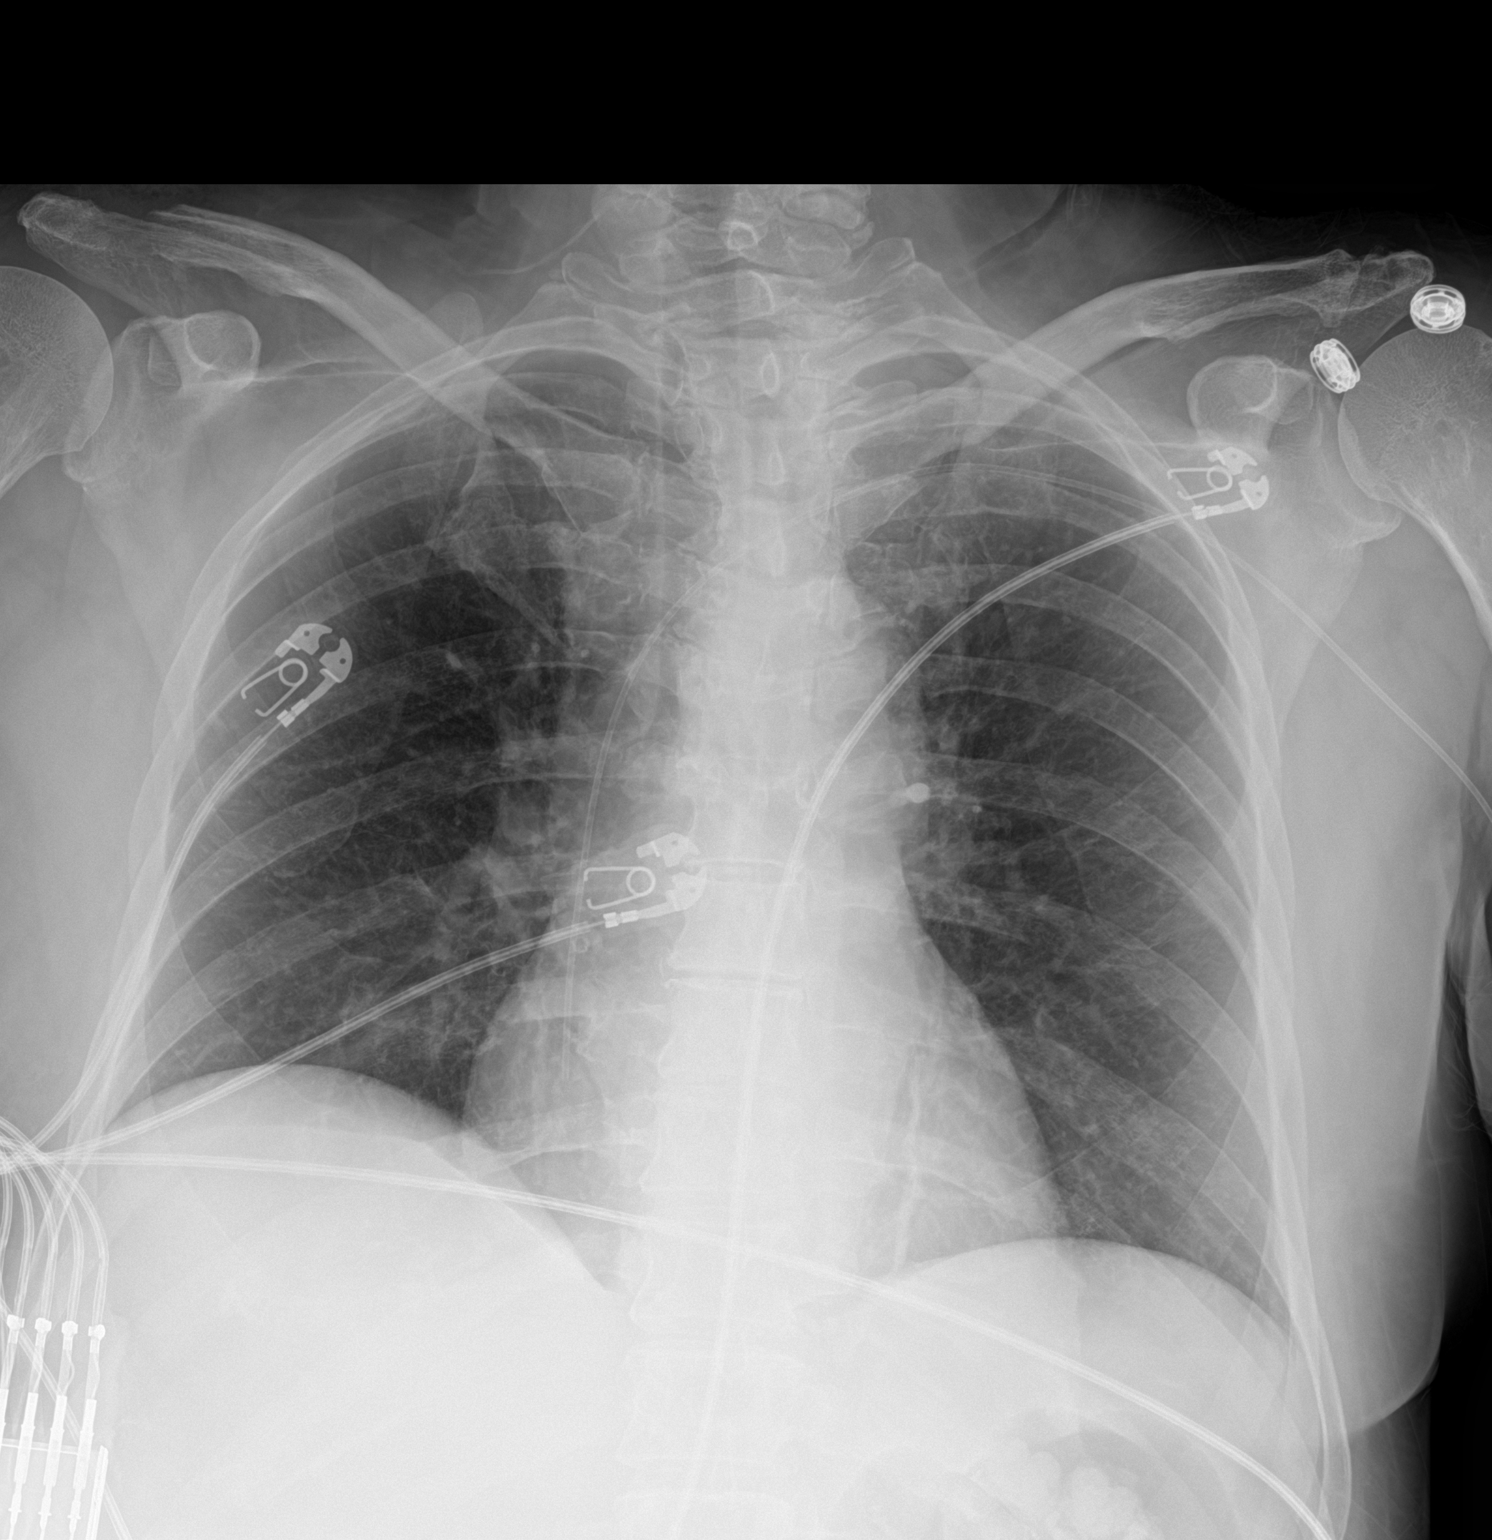

[1 of 1 positions shown; findings below may reference images not displayed]

FINDINGS: A left PICC line terminates near the caval atrial junction. No
pneumothorax. The heart, hila, mediastinum, lungs, and pleura are
otherwise normal.
IMPRESSION: The left PICC line is in good position.  No acute abnormalities.

## 2020-04-29 IMAGING — CT CT ANGIO CHEST
2 of 7 series · 18 of 46 positions shown · IV contrast (omnipaque)
Comparison: Chest radiograph dated [DATE].

CLINICAL DATA: 58-year-old female with concern for pulmonary
embolism.

EXAM:
CT ANGIOGRAPHY CHEST WITH CONTRAST
TECHNIQUE: Multidetector CT imaging of the chest was performed using the
standard protocol during bolus administration of intravenous
contrast. Multiplanar CT image reconstructions and MIPs were
obtained to evaluate the vascular anatomy.
CONTRAST:  75mL OMNIPAQUE IOHEXOL 350 MG/ML SOLN

[Series 6: thins · axial · 0.75mm/px · z∈[+1138,+1414]mm · 15 of 310 slices shown]
[im 17/310  lung]
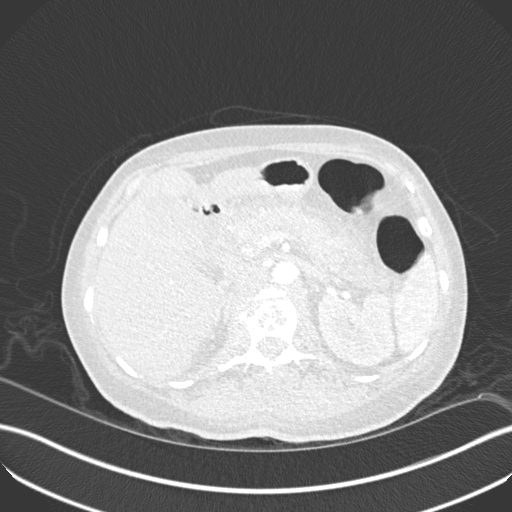
[im 33/310  soft-tissue]
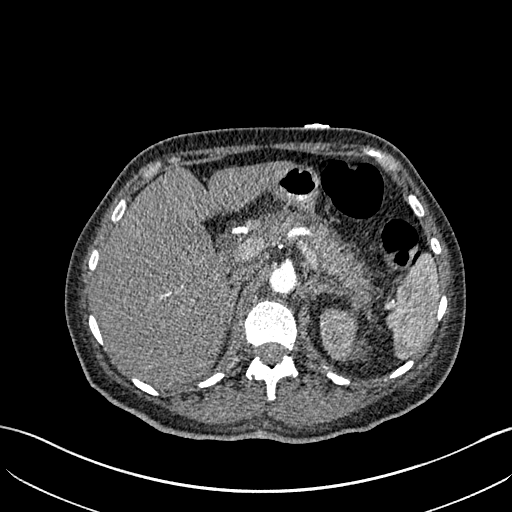
[im 66/310  lung]
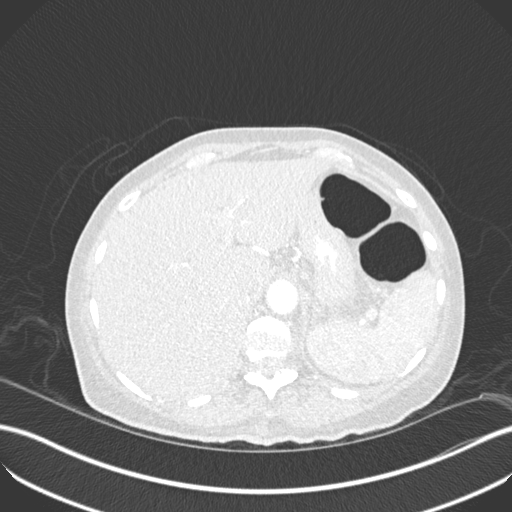
[im 82/310  soft-tissue]
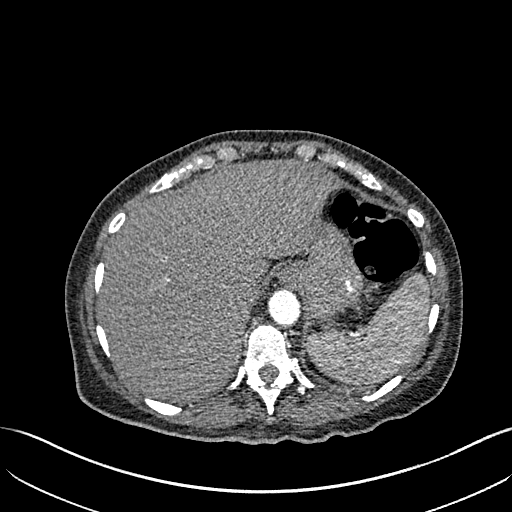
[im 98/310  lung]
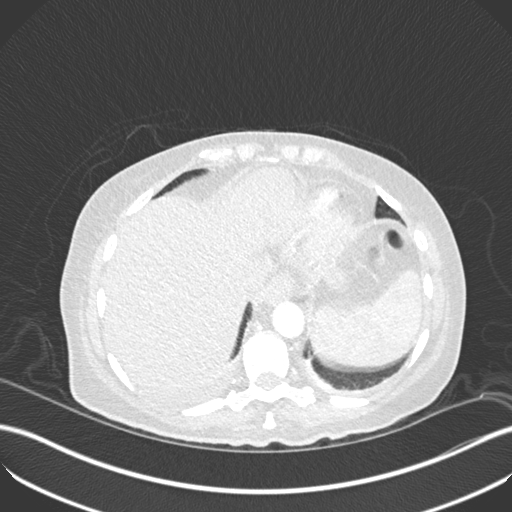
[im 114/310  soft-tissue]
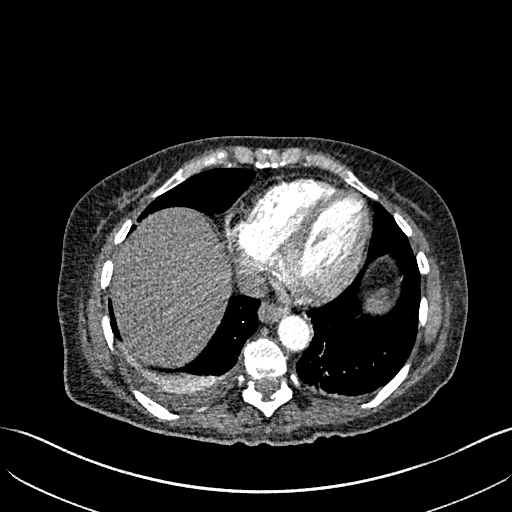
[im 131/310  lung]
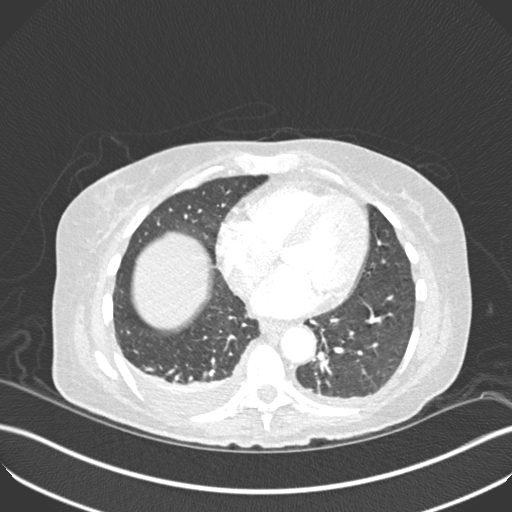
[im 163/310  soft-tissue]
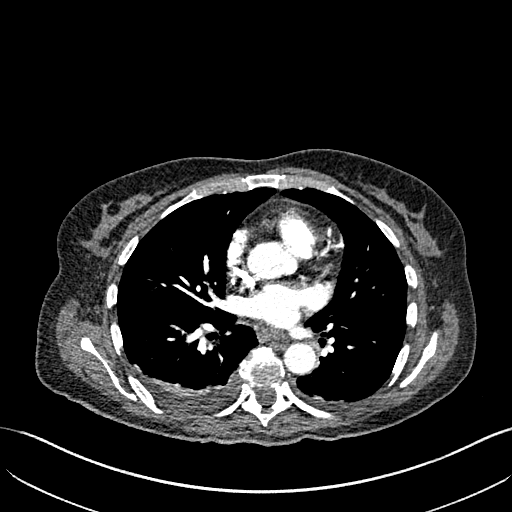
[im 179/310  lung]
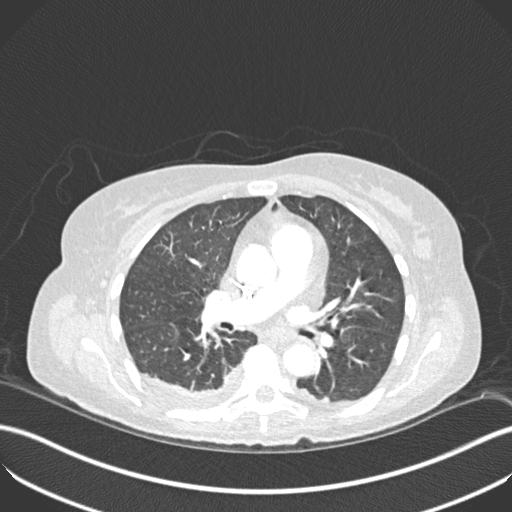
[im 196/310  soft-tissue]
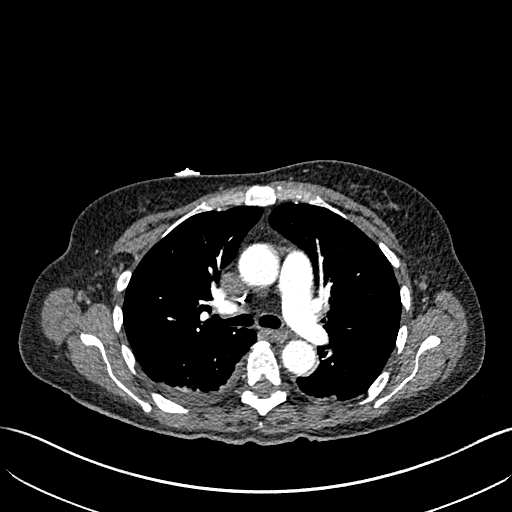
[im 212/310  lung]
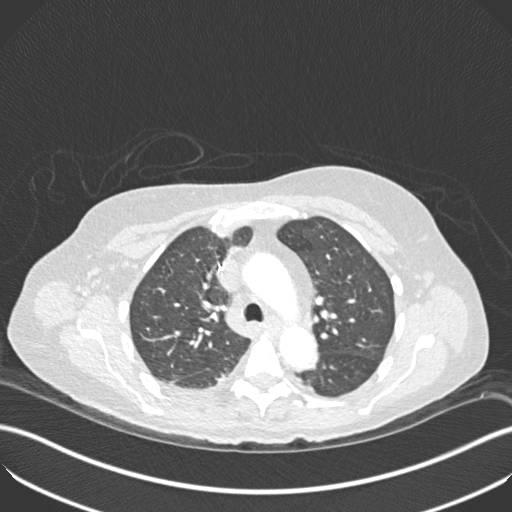
[im 228/310  soft-tissue]
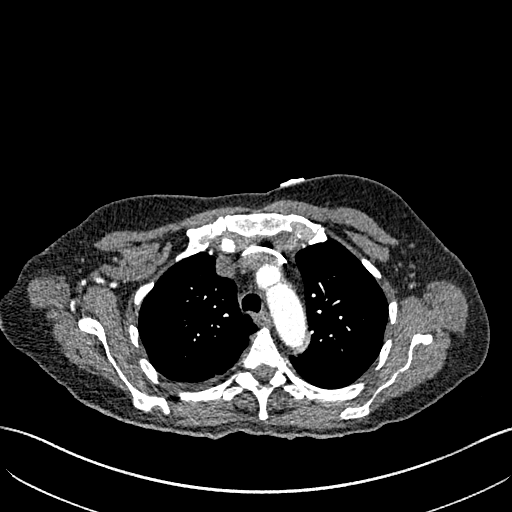
[im 261/310  lung]
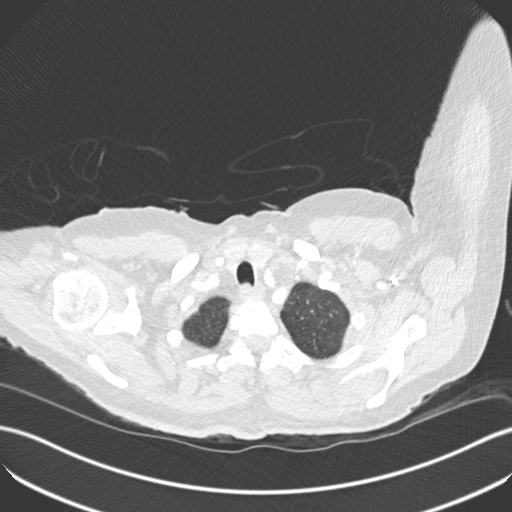
[im 277/310  soft-tissue]
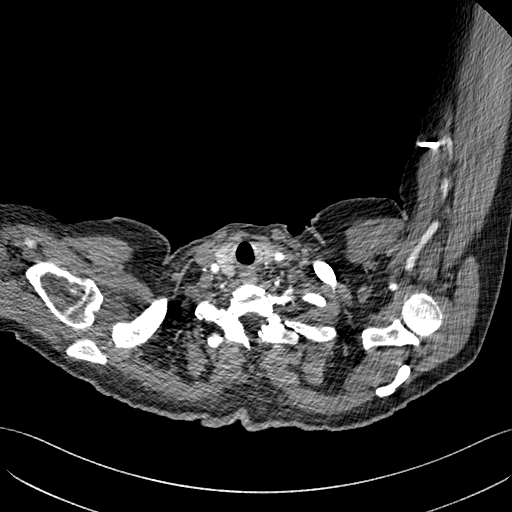
[im 293/310  lung]
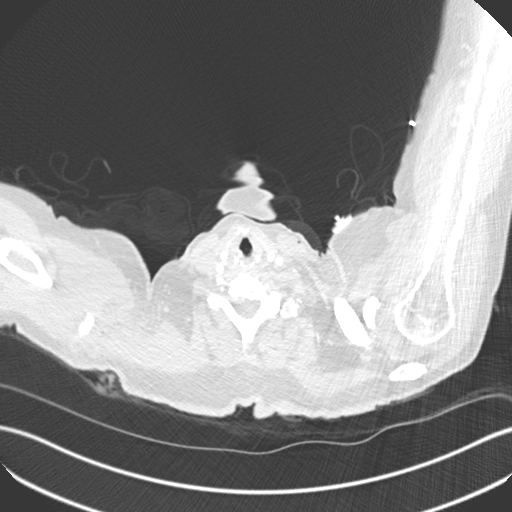

[Series 8: coronal mpr · coronal · 0.68mm/px · 3 of 140 slices shown]
[im 35/140  soft-tissue]
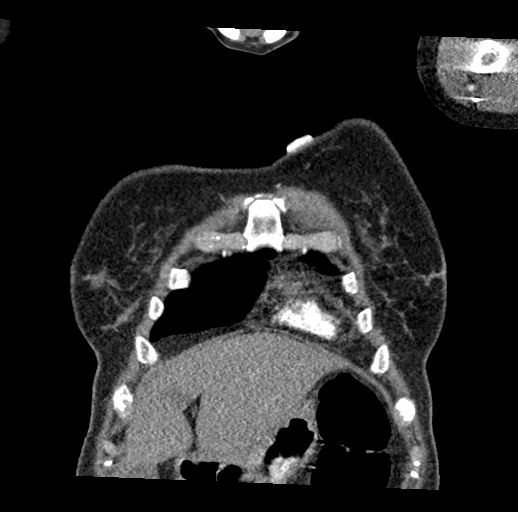
[im 70/140  soft-tissue]
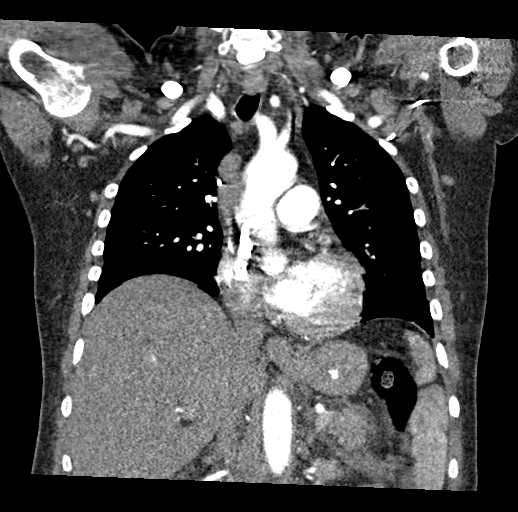
[im 105/140  soft-tissue]
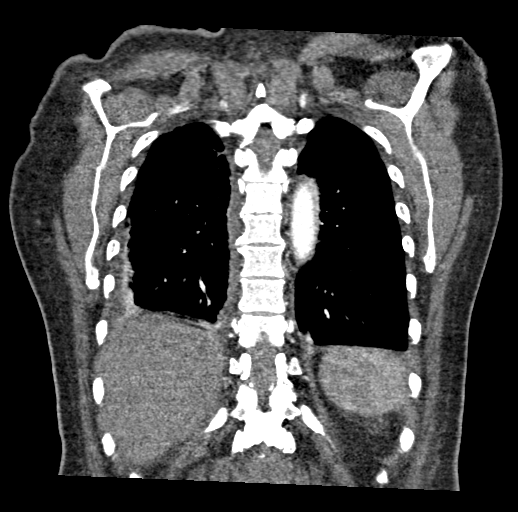

[18 of 46 positions shown; findings below may reference images not displayed]

FINDINGS: Cardiovascular: There is no cardiomegaly or pericardial effusion.
The thoracic aorta is unremarkable. The origins of the great vessels
of the aortic arch appear patent as visualized. A central venous
line noted with tip over central SVC close to the cavoatrial
junction. No CT evidence of pulmonary embolism.

Mediastinum/Nodes: There is no hilar or mediastinal adenopathy.
Several small mediastinal lymph nodes. The esophagus and the thyroid
gland are grossly unremarkable. No mediastinal fluid collection.

Lungs/Pleura: Small right and trace left pleural effusions. There is
associated partial compressive atelectasis of the lower lobes. There
is no pneumothorax. The central airways are patent.

Upper Abdomen: There is inflammatory changes of the pancreas
consistent with acute pancreatitis. Correlation with clinical exam
and pancreatic enzymes recommended. Scattered calcific foci
throughout the pancreas likely sequela of chronic inflammation.
Probable background of fatty liver. Trace layering gallbladder
sludge versus small stones.

Musculoskeletal: Irregularity and destructive changes of the
inferior L1 and superior L2 endplate and disc with associated
prevertebral density most consistent with osteomyelitis/discitis.
Clinical correlation is recommended. This is suboptimally evaluated
and partially visualized on this CT and better evaluated on the
recent MRI.

Review of the MIP images confirms the above findings.
IMPRESSION: 1. No CT evidence of pulmonary embolism.
2. Findings most consistent with acute pancreatitis. Correlation
with clinical exam and pancreatic enzymes recommended.
3. Small right and trace left pleural effusions with associated
partial compressive atelectasis of the lower lobes.
4. Findings of spondylo discitis at L1-L2.

## 2020-04-29 MED ORDER — ENSURE ENLIVE PO LIQD
237.0000 mL | Freq: Two times a day (BID) | ORAL | Status: DC
  Administered 2020-04-29 – 2020-05-03 (×3): 237 mL via ORAL

## 2020-04-29 MED ORDER — JUVEN PO PACK
1.0000 | PACK | Freq: Two times a day (BID) | ORAL | Status: DC
  Administered 2020-04-29 – 2020-05-09 (×14): 1 via ORAL
  Filled 2020-04-29 (×12): qty 1

## 2020-04-29 MED ORDER — IOHEXOL 350 MG/ML SOLN
75.0000 mL | Freq: Once | INTRAVENOUS | Status: AC | PRN
  Administered 2020-04-29: 75 mL via INTRAVENOUS

## 2020-04-29 MED ORDER — LACTATED RINGERS IV SOLN: INTRAVENOUS | Status: DC

## 2020-04-29 MED ORDER — LACTATED RINGERS IV BOLUS
1000.0000 mL | Freq: Once | INTRAVENOUS | Status: AC
  Administered 2020-04-29: 1000 mL via INTRAVENOUS

## 2020-04-29 MED ORDER — POTASSIUM CHLORIDE CRYS ER 20 MEQ PO TBCR
40.0000 meq | EXTENDED_RELEASE_TABLET | Freq: Once | ORAL | Status: AC
  Administered 2020-04-29: 40 meq via ORAL
  Filled 2020-04-29: qty 2

## 2020-04-29 MED ORDER — MAGNESIUM SULFATE 2 GM/50ML IV SOLN
2.0000 g | Freq: Once | INTRAVENOUS | Status: AC
  Administered 2020-04-29: 2 g via INTRAVENOUS
  Filled 2020-04-29: qty 50

## 2020-04-29 NOTE — Progress Notes (Signed)
Initial Nutrition Assessment  DOCUMENTATION CODES:   Not applicable  INTERVENTION:  -Ensure Enlive po BID, each supplement provides 350 kcal and 20 grams of protein -1 packet Juven BID, each packet provides 95 calories, 2.5 grams of protein (collagen), and 9.8 grams of carbohydrate (3 grams sugar); also contains 7 grams of L-arginine and L-glutamine, 300 mg vitamin C, 15 mg vitamin E, 1.2 mcg vitamin B-12, 9.5 mg zinc, 200 mg calcium, and 1.5 g  Calcium Beta-hydroxy-Beta-methylbutyrate to support wound healing -Food preferences updated in [employer]   NUTRITION DIAGNOSIS:   Inadequate oral intake related to decreased appetite as evidenced by per patient/family report.  GOAL:   Patient will meet greater than or equal to 90% of their needs   MONITOR:   Labs, I & O's, Supplement acceptance, Weight trends, PO intake, Skin  REASON FOR ASSESSMENT:   Consult Assessment of nutrition requirement/status  ASSESSMENT:  RD working remotely.  59 year old female admitted on 04/23/20 for MSSA bacteremia with past medical history of drug abuse presented with 3 week history of lower back pain which later progressed to lower extremities and wound to right shoulder with purulent discharge and erythema secondary to a fall while in the shower. 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 Patient is s/p I&D and distal clavicle resection on 6/15. Patient with chronic hypokalemia and hypomagnesemia, recalls being followed by PCP for hypokalemia over 5 years ago, requiring multiple doses of potassium replacement during admission.   Patient with poor po, per flowsheets she is eating 0-50% x 4 documented intakes. Spoke with patient via phone this morning. She  reports eating a little bit of her breakfast, states that she has not had much of an  appetite. At home, she endorses good appetite/intake recalls lots of cereal, oatmeal with extra butter and sugar, steak, corn, vegetables and fruits. RD encouraged po intake and educated on the importance of calories and protein to support post-op healing. She is amenable to drinking Ensure to aid with meeting needs as well as Juven to support wound healing.   Patient reports no bowl movements this admission, recalls daily BMs at home. Per medications, she is receiving Colace daily, consider laxative.   Current wt 137.72 lb Limited wt history available, per chart pt weighed 141.69 lbs on 02/24/20. This indicates a 4 lb wt loss in 2 months which is insignificant.  Medications reviewed and include: Klor-con 40 mEq daily, Colace, Oxycontin IVPB: Ancef, Mg sulfate Labs: reviewed  NUTRITION - FOCUSED PHYSICAL EXAM: Unable to complete at this time, RD working remotely.   Diet Order:   Diet Order            Diet regular Room service appropriate? Yes; Fluid consistency: Thin  Diet effective now                 EDUCATION NEEDS:   Education needs have been addressed  Skin:  Skin Assessment: Skin Integrity Issues: Skin Integrity Issues:: Incisions Incisions: closed; mid back;right shoulder  Last BM:  pta  Height:   Ht Readings from Last 1 Encounters:  04/23/20 5\' 4"  (1.626 m)    Weight:   Wt Readings from Last 1 Encounters:  04/23/20 62.6 kg    BMI:  Body mass index is 23.69 kg/m.  Estimated Nutritional Needs:   Kcal:  1900-2100  Protein:  95-105  Fluid:  >/= 1.9 L/day  Lajuan Lines, RD, LDN Clinical Nutrition After Hours/Weekend Pager # [phone]

## 2020-04-29 NOTE — Treatment Plan (Signed)
Pt more tachycardia with tachypnea at rest, denied sob. CXR was clear. Ordered and reviewed CTA chest, neg for PE but incidentally has findings consistent with acute pancreatitis with scattered calcific foci throughout the pancreas likely sequela of chronic inflammation. Repeat lipase, lipid panel ordered and is pending. Have also ordered repeat lactic acid. Have started LR at 125cc/hr and will give 1L bolus now. Of note, gallstones and sludge vs small stones seen on CT. Have ordered dedicated RUQ [place]. Called and updated patient. Patient states she does not drink alcohol with last use over 30yrs ago with pt drinking only socially. Will follow

## 2020-04-29 NOTE — Plan of Care (Signed)

## 2020-04-29 NOTE — Progress Notes (Signed)
PROGRESS NOTE    Debra Schmidt  RJJ:[id number] DOB: 08-Apr-1961 DOA: 04/23/2020 PCP: Patient, No Pcp Per    Brief Narrative:  59 y.o.female with remote history of Xanax and hydrocodone use presented to the ED 6/13 , she states that she fell in the bathtub several weeks ago injuring her right shoulder, eventually developed a draining wound on the top of her shoulder. She began to have progressively more severe back pain left urinary incontinence leading to admission 6/13. MRI showed L1-2 discitis with thoracolumbar abscess. She underwent emergent surgery by Dr. 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 ID to f/u in clinic next month -Underwent L1/2 laminectomy, epidural abscess evacuation on 6/13 -Follow-up OR cultures, anticipate MSSA -Pt underwent I&D and distal clavicle resection 6/15 -PT OT recs for CIR, possible in the next 1-2 days  Chronic hypokalemia and hypomagnesemia -Etiology not clear although albumin is <2. Suspect marked malnutrition prior to admit. Have consulted dietitian -Chart reviewed. Pt has hx of profound hypokalemia, first documented here on 02/24/20 with a K of 2.7 -She recalls being followed for hypokalemia when she was following up with her PCP over 5 years ago. Currently not seeing PCP -random cortisol level  normal -Pt has required multiple doses of potassium replacement -Potassium slowly improved after multiple doses of potassium and magnesium replacement -K of 3.9 with Mg of 1.8 -Will give 97mEq of KCl and 2gm more of Mg -Repeat lytes in AM  Acute on chronic anemia -The setting of severe infections and surgery -Cont to monitor  Hypertension -BP trends somewhat improved since starting lisinopril -have ordered PRN lopressor IV -Cont to titrate BP meds accordingly  Tachycardia with tachypnea -Patient attributes to post-op pain. Patient reports marked pain, described as cramping, corresponding to times of documented tachycardia -When seen this AM, HR around 100 with RR around the mid-20's, pt appearing comfortable -Denies chest pain or sob -Ordered and reviewed CXR, neg -Will check CTA chest to r/o PE  DVT prophylaxis: SCD's Code Status: Full Family Communication: Pt in room, family not at bedside  Consultants:   Orthopedic surgery  ID  Procedures:  PRE-OPERATIVE DIAGNOSIS: Epidural Mass  POST-OPERATIVE DIAGNOSIS: Epidural Abscess, L1/2 discitis  PROCEDURE: Procedure(s): Lumbar One-Two LAMINECTOMY for Epidural Abscess Evacuation  SURGEON:Surgeon(s):  Ashok Pall, MD   Antimicrobials: Anti-infectives (From admission, onward)   Start     Dose/Rate Route Frequency Ordered Stop   04/25/20 1641  vancomycin (VANCOCIN) powder  Status:  Discontinued          As needed 04/25/20 1641 04/25/20 1657   04/25/20 1200  ceFAZolin (ANCEF) IVPB 2g/100 mL premix     Discontinue     2 g 200 mL/hr over 30 Minutes Intravenous Every 8 hours 04/25/20 0930 06/03/20 2359   04/24/20 1200  vancomycin (VANCOREADY) IVPB 750 mg/150 mL  Status:  Discontinued        750  mg 150 mL/hr over 60 Minutes Intravenous Every 12 hours 04/24/20 0156 04/25/20 0930   04/24/20 0800  piperacillin-tazobactam (ZOSYN) IVPB 3.375 g  Status:  Discontinued        3.375 g 12.5 mL/hr over 240 Minutes  Intravenous Every 8 hours 04/24/20 0156 04/24/20 1423   04/23/20 2215  piperacillin-tazobactam (ZOSYN) IVPB 3.375 g        3.375 g 100 mL/hr over 30 Minutes Intravenous  Once 04/23/20 2202 04/24/20 0031   04/23/20 2215  vancomycin (VANCOREADY) IVPB 1250 mg/250 mL        1,250 mg 166.7 mL/hr over 90 Minutes Intravenous  Once 04/23/20 2202 04/24/20 0101      Subjective: Tachycardic and tachypneic this AM although pt denies symptoms  Objective: Vitals:   04/29/20 0800 04/29/20 1109 04/29/20 1133 04/29/20 1200  BP: (!) 146/88 (!) 152/90 (!) 155/84 (!) 152/86  Pulse: 96  (!) 107 (!) 105  Resp: (!) 32  (!) 40 (!) 40  Temp: 98.3 F (36.8 C)   98.6 F (37 C)  TempSrc: Oral   Oral  SpO2: 94%  95% 95%  Weight:      Height:        Intake/Output Summary (Last 24 hours) at 04/29/2020 1446 Last data filed at 04/29/2020 0600 Gross per 24 hour  Intake 1570 ml  Output 1050 ml  Net 520 ml   Filed Weights   04/23/20 1332  Weight: 62.6 kg    Examination: General exam: Awake, laying in bed, in nad Respiratory system: Normal respiratory effort, tachypnea, no wheezing Cardiovascular system: tachycardic s1, s2 Gastrointestinal system: Soft, nondistended, positive BS Central nervous system: CN2-12 grossly intact, strength intact Extremities: Perfused, no clubbing Skin: Normal skin turgor, no notable skin lesions seen Psychiatry: Mood normal // no visual hallucinations   Data Reviewed: I have personally reviewed following labs and imaging studies  CBC: Recent Labs  Lab 04/23/20 1339 04/23/20 2056 04/23/20 2356 04/24/20 0605 04/25/20 0754 04/25/20 1402 04/26/20 0630  WBC 9.1  --   --  11.3* 14.2*  --  16.2*  HGB 10.4*   < > 8.5* 8.7* 8.9* 9.2* 8.9*  HCT 32.2*   < > 25.0* 27.3* 27.6* 27.0* 27.5*  MCV 84.7  --   --  84.8 84.7  --  83.6  PLT 407*  --   --  366 347  --  379   < > = values in this interval not displayed.   Basic Metabolic Panel: Recent Labs  Lab 04/23/20 1339  04/23/20 2054 04/24/20 0605 04/24/20 1039 04/25/20 0528 04/25/20 0754 04/26/20 0630 04/26/20 1656 04/27/20 0949 04/28/20 0522 04/29/20 0544  NA 140   < > 140  --   --    < > 136 138 138 134* 136  K 2.0*   < > 2.2*  --   --    < > 2.5* 4.1 2.9* 3.2* 3.9  CL 97*   < > 101  --   --    < > 97* 100 101 98 102  CO2 27   < > 29  --   --    < > GLUCOSE 143*   < > 120*  --   --    < > 97 96 112* 105* 109*  BUN 5*   < > <5*  --   --    < > <5* 5* 5* <5* 5*  CREATININE 0.79   < > 0.69  --   --    < >  0.50 0.55 0.56 0.55 0.57  CALCIUM 9.4   < > 8.8*  --   --    < > 8.0* 8.2* 8.2* 8.0* 8.3*  MG 1.6*  --  1.8  --   --   --   --   --  1.3* 1.8 1.8  PHOS  --   --   --  3.8 2.3*  --   --   --   --   --   --    < > = values in this interval not displayed.   GFR: Estimated Creatinine Clearance: 65.4 mL/min (by C-G formula based on SCr of 0.57 mg/dL). Liver Function Tests: Recent Labs  Lab 04/23/20 1339 04/24/20 0605 04/27/20 0949 04/28/20 0522 04/29/20 0544  AST 19 18 15 17 18   ALT 13 11 9 8 8   ALKPHOS 74 64 71 74 70  BILITOT 0.6 0.7 0.6 0.3 0.6  PROT 7.7 6.4* 6.4* 6.4* 6.2*  ALBUMIN 2.4* 1.9* 1.8* 1.8* 1.7*   Recent Labs  Lab 04/23/20 1339  LIPASE 25   No results for input(s): AMMONIA in the last 168 hours. Coagulation Profile: No results for input(s): INR, PROTIME in the last 168 hours. Cardiac Enzymes: No results for input(s): CKTOTAL, CKMB, CKMBINDEX, TROPONINI in the last 168 hours. BNP (last 3 results) No results for input(s): PROBNP in the last 8760 hours. HbA1C: No results for input(s): HGBA1C in the last 72 hours. CBG: Recent Labs  Lab 04/24/20 0550  GLUCAP 109*   Lipid Profile: No results for input(s): CHOL, HDL, LDLCALC, TRIG, CHOLHDL, LDLDIRECT in the last 72 hours. Thyroid Function Tests: No results for input(s): TSH, T4TOTAL, FREET4, T3FREE, THYROIDAB in the last 72 hours. Anemia Panel: No results for input(s): VITAMINB12, FOLATE, FERRITIN,  TIBC, IRON, RETICCTPCT in the last 72 hours. Sepsis Labs: No results for input(s): PROCALCITON, LATICACIDVEN in the last 168 hours.  Recent Results (from the past 240 hour(s))  SARS Coronavirus 2 by RT PCR (hospital order, performed in [facility] hospital lab) Nasopharyngeal Nasopharyngeal Swab     Status: None   Collection Time: 04/23/20  6:51 PM   Specimen: Nasopharyngeal Swab  Result Value Ref Range Status   SARS Coronavirus 2 NEGATIVE NEGATIVE Final    Comment: (NOTE) SARS-CoV-2 target nucleic acids are NOT DETECTED.  The SARS-CoV-2 RNA is generally detectable in upper and lower respiratory specimens during the acute phase of infection. The lowest concentration of SARS-CoV-2 viral copies this assay can detect is 250 copies / mL. A negative result does not preclude SARS-CoV-2 infection and should not be used as the sole basis for treatment or other patient management decisions.  A negative result may occur with improper specimen collection / handling, submission of specimen other than nasopharyngeal swab, presence of viral mutation(s) within the areas targeted by this assay, and inadequate number of viral copies (<250 copies / mL). A negative result must be combined with clinical observations, patient history, and epidemiological information.  Fact Sheet for Patients:   [facility]  Fact Sheet for Healthcare Providers: 04/25/20  This test is not yet approved or  cleared by the [employer] FDA and has been authorized for detection and/or diagnosis of SARS-CoV-2 by FDA under an Emergency Use Authorization (EUA).  This EUA will remain in effect (meaning this test can be used) for the duration of the COVID-19 declaration under Section 564(b)(1) of the Act, 21 U.S.C. section 360bbb-3(b)(1), unless the authorization is terminated or revoked sooner.  Performed at [facility]  Lab, [address] [place],  [place] [address]   Blood culture (routine x 2)     Status: Abnormal   Collection Time: 04/23/20  7:30 PM   Specimen: BLOOD  Result Value Ref Range Status   Specimen Description BLOOD RIGHT ANTECUBITAL  Final   Special Requests   Final    BOTTLES DRAWN AEROBIC AND ANAEROBIC Blood Culture adequate volume   Culture  Setup Time   Final    AEROBIC BOTTLE ONLY GRAM POSITIVE COCCI IN CLUSTERS CRITICAL RESULT CALLED TO, READ BACK BY AND VERIFIED WITH: Tori Milks [facility] 04/24/20 2317 JDW Performed at [facility] Lab, [address] [place], [place] [address]    Culture STAPHYLOCOCCUS AUREUS (A)  Final   Report Status 04/26/2020 FINAL  Final   Organism ID, Bacteria STAPHYLOCOCCUS AUREUS  Final      Susceptibility   Staphylococcus aureus - MIC*    CIPROFLOXACIN <=0.5 SENSITIVE Sensitive     ERYTHROMYCIN <=0.25 SENSITIVE Sensitive     GENTAMICIN <=0.5 SENSITIVE Sensitive     OXACILLIN <=0.25 SENSITIVE Sensitive     TETRACYCLINE <=1 SENSITIVE Sensitive     VANCOMYCIN <=0.5 SENSITIVE Sensitive     TRIMETH/SULFA <=10 SENSITIVE Sensitive     CLINDAMYCIN <=0.25 SENSITIVE Sensitive     RIFAMPIN <=0.5 SENSITIVE Sensitive     Inducible Clindamycin NEGATIVE Sensitive     * STAPHYLOCOCCUS AUREUS  Blood Culture ID Panel (Reflexed)     Status: Abnormal   Collection Time: 04/23/20  7:30 PM  Result Value Ref Range Status   Enterococcus species NOT DETECTED NOT DETECTED Final   Listeria monocytogenes NOT DETECTED NOT DETECTED Final   Staphylococcus species DETECTED (A) NOT DETECTED Final    Comment: CRITICAL RESULT CALLED TO, READ BACK BY AND VERIFIED WITH: L SEAY PHARMD 04/24/20 2317 JDW    Staphylococcus aureus (BCID) DETECTED (A) NOT DETECTED Final    Comment: Methicillin (oxacillin) susceptible Staphylococcus aureus (MSSA). Preferred therapy is anti staphylococcal beta lactam antibiotic (Cefazolin or Nafcillin), unless clinically contraindicated. CRITICAL RESULT CALLED TO, READ BACK BY AND VERIFIED WITH: L  SEAY PHARMD 04/24/20 2317 JDW    Methicillin resistance NOT DETECTED NOT DETECTED Final   Streptococcus species NOT DETECTED NOT DETECTED Final   Streptococcus agalactiae NOT DETECTED NOT DETECTED Final   Streptococcus pneumoniae NOT DETECTED NOT DETECTED Final   Streptococcus pyogenes NOT DETECTED NOT DETECTED Final   Acinetobacter baumannii NOT DETECTED NOT DETECTED Final   Enterobacteriaceae species NOT DETECTED NOT DETECTED Final   Enterobacter cloacae complex NOT DETECTED NOT DETECTED Final   Escherichia coli NOT DETECTED NOT DETECTED Final   Klebsiella oxytoca NOT DETECTED NOT DETECTED Final   Klebsiella pneumoniae NOT DETECTED NOT DETECTED Final   Proteus species NOT DETECTED NOT DETECTED Final   Serratia marcescens NOT DETECTED NOT DETECTED Final   Haemophilus influenzae NOT DETECTED NOT DETECTED Final   Neisseria meningitidis NOT DETECTED NOT DETECTED Final   Pseudomonas aeruginosa NOT DETECTED NOT DETECTED Final   Candida albicans NOT DETECTED NOT DETECTED Final   Candida glabrata NOT DETECTED NOT DETECTED Final   Candida krusei NOT DETECTED NOT DETECTED Final   Candida parapsilosis NOT DETECTED NOT DETECTED Final   Candida tropicalis NOT DETECTED NOT DETECTED Final    Comment: Performed at [facility], [address], [place], [place] [address]  Blood culture (routine x 2)     Status: None   Collection Time: 04/23/20  7:35 PM   Specimen: BLOOD RIGHT FOREARM  Result Value Ref Range Status   Specimen Description BLOOD RIGHT FOREARM  Final   Special Requests   Final    BOTTLES DRAWN AEROBIC AND ANAEROBIC Blood Culture results may not be optimal due to an inadequate volume of blood received in culture bottles   Culture   Final    NO GROWTH 5 DAYS Performed at [facility] Lab, [address], [place], [place] [address]    Report Status 04/28/2020 FINAL  Final  Aerobic/Anaerobic Culture (surgical/deep wound)     Status: None   Collection Time: 04/23/20 10:37 PM    Specimen: Abscess  Result Value Ref Range Status   Specimen Description ABSCESS  Final   Special Requests THORACOULMBAR  Final   Gram Stain   Final    FEW WBC PRESENT, PREDOMINANTLY PMN FEW GRAM POSITIVE COCCI CRITICAL RESULT CALLED TO, READ BACK BY AND VERIFIED WITH: RN T ROWE @0107  04/24/20 BY S GEZAHEGN    Culture   Final    FEW STAPHYLOCOCCUS AUREUS NO ANAEROBES ISOLATED Performed at [facility], [address], [place], [place]    Report Status 04/29/2020 FINAL  Final   Organism ID, Bacteria STAPHYLOCOCCUS AUREUS  Final      Susceptibility   Staphylococcus aureus - MIC*    CIPROFLOXACIN <=0.5 SENSITIVE Sensitive     ERYTHROMYCIN <=0.25 SENSITIVE Sensitive     GENTAMICIN <=0.5 SENSITIVE Sensitive     OXACILLIN <=0.25 SENSITIVE Sensitive     TETRACYCLINE <=1 SENSITIVE Sensitive     VANCOMYCIN <=0.5 SENSITIVE Sensitive     TRIMETH/SULFA <=10 SENSITIVE Sensitive     CLINDAMYCIN <=0.25 SENSITIVE Sensitive     RIFAMPIN <=0.5 SENSITIVE Sensitive     Inducible Clindamycin NEGATIVE Sensitive     * FEW STAPHYLOCOCCUS AUREUS  Aerobic/Anaerobic Culture (surgical/deep wound)     Status: None   Collection Time: 04/23/20 10:37 PM   Specimen: Soft Tissue, Other  Result Value Ref Range Status   Specimen Description ABSCESS  Final   Special Requests EPIDURAL  Final   Gram Stain NO WBC SEEN NO ORGANISMS SEEN   Final   Culture   Final    FEW STAPHYLOCOCCUS AUREUS NO ANAEROBES ISOLATED Performed at [facility] Lab, [address] [place], [place]    Report Status 04/29/2020 FINAL  Final   Organism ID, Bacteria STAPHYLOCOCCUS AUREUS  Final      Susceptibility   Staphylococcus aureus - MIC*    CIPROFLOXACIN <=0.5 SENSITIVE Sensitive     ERYTHROMYCIN <=0.25 SENSITIVE Sensitive     GENTAMICIN <=0.5 SENSITIVE Sensitive     OXACILLIN <=0.25 SENSITIVE Sensitive     TETRACYCLINE <=1 SENSITIVE Sensitive     VANCOMYCIN <=0.5 SENSITIVE Sensitive      TRIMETH/SULFA <=10 SENSITIVE Sensitive     CLINDAMYCIN <=0.25 SENSITIVE Sensitive     RIFAMPIN <=0.5 SENSITIVE Sensitive     Inducible Clindamycin NEGATIVE Sensitive     * FEW STAPHYLOCOCCUS AUREUS  MRSA PCR Screening     Status: None   Collection Time: 04/24/20  1:50 AM   Specimen: Nasal Mucosa; Nasopharyngeal  Result Value Ref Range Status   MRSA by PCR NEGATIVE NEGATIVE Final    Comment:        The GeneXpert MRSA Assay (FDA approved for NASAL specimens only), is one component of a comprehensive MRSA colonization surveillance program. It is not intended to diagnose MRSA infection nor to guide or monitor treatment for MRSA infections. Performed at [facility], [address] [place], [place] [address]   Culture, blood (routine x 2)     Status: None   Collection Time: 04/24/20  8:20 AM   Specimen: BLOOD LEFT ARM  Result Value Ref Range Status   Specimen Description BLOOD LEFT ARM  Final   Special Requests   Final    BOTTLES DRAWN AEROBIC ONLY Blood Culture adequate volume   Culture   Final    NO GROWTH 5 DAYS Performed at [facility], [address] [place], [place] [address]    Report Status 04/29/2020 FINAL  Final  Culture, blood (routine x 2)     Status: None   Collection Time: 04/24/20  8:20 AM   Specimen: BLOOD LEFT ARM  Result Value Ref Range Status   Specimen Description BLOOD LEFT ARM  Final   Special Requests   Final    BOTTLES DRAWN AEROBIC ONLY Blood Culture results may not be optimal due to an inadequate volume of blood received in culture bottles   Culture   Final    NO GROWTH 5 DAYS Performed at [facility], [address] [place], [place] [address]    Report Status 04/29/2020 FINAL  Final  Culture, blood (routine x 2)     Status: None (Preliminary result)   Collection Time: 04/25/20  7:08 AM   Specimen: BLOOD  Result Value Ref Range Status   Specimen Description BLOOD RIGHT ANTECUBITAL  Final   Special Requests   Final     BOTTLES DRAWN AEROBIC AND ANAEROBIC Blood Culture adequate volume   Culture   Final    NO GROWTH 4 DAYS Performed at [facility], [address] [place], [place] [address]    Report Status PENDING  Incomplete  Culture, blood (routine x 2)     Status: None (Preliminary result)   Collection Time: 04/25/20  7:14 AM   Specimen: BLOOD  Result Value Ref Range Status   Specimen Description BLOOD LEFT ANTECUBITAL  Final   Special Requests   Final    BOTTLES DRAWN AEROBIC AND ANAEROBIC Blood Culture adequate volume   Culture   Final    NO GROWTH 4 DAYS Performed at [facility], [address] [place], [place] [address]    Report Status PENDING  Incomplete  Surgical PCR screen     Status: None   Collection Time: 04/25/20  7:54 AM   Specimen: Nasal Mucosa; Nasal Swab  Result Value Ref Range Status   MRSA, PCR NEGATIVE NEGATIVE Final   Staphylococcus aureus NEGATIVE NEGATIVE Final    Comment: (NOTE) The Xpert SA Assay (FDA approved for NASAL specimens in patients 60 years of age and older), is one component of a comprehensive surveillance program. It is not intended to diagnose infection nor to guide or monitor treatment. Performed at [facility], [address] [place], [place] [address]   Aerobic/Anaerobic Culture (surgical/deep wound)     Status: None (Preliminary result)   Collection Time: 04/25/20  4:44 PM   Specimen: Wound  Result Value Ref Range Status   Specimen Description WOUND  Final   Special Requests RT ACROMIOCLAVICULAR JOINT  Final   Gram Stain   Final    FEW WBC PRESENT, PREDOMINANTLY PMN NO ORGANISMS SEEN Performed at [facility]  Lab, [address] [place], [place] [address]    Culture   Final    RARE STAPHYLOCOCCUS AUREUS NO ANAEROBES ISOLATED; CULTURE IN  PROGRESS FOR 5 DAYS    Report Status PENDING  Incomplete   Organism ID, Bacteria STAPHYLOCOCCUS AUREUS  Final      Susceptibility   Staphylococcus aureus - MIC*    CIPROFLOXACIN  <=0.5 SENSITIVE Sensitive     ERYTHROMYCIN <=0.25 SENSITIVE Sensitive     GENTAMICIN <=0.5 SENSITIVE Sensitive     OXACILLIN <=0.25 SENSITIVE Sensitive     TETRACYCLINE <=1 SENSITIVE Sensitive     VANCOMYCIN <=0.5 SENSITIVE Sensitive     TRIMETH/SULFA <=10 SENSITIVE Sensitive     CLINDAMYCIN <=0.25 SENSITIVE Sensitive     RIFAMPIN <=0.5 SENSITIVE Sensitive     Inducible Clindamycin NEGATIVE Sensitive     * RARE STAPHYLOCOCCUS AUREUS     Radiology Studies: DG CHEST PORT 1 VIEW  Result Date: 04/29/2020 CLINICAL DATA:  Tachypnea. EXAM: PORTABLE CHEST 1 VIEW COMPARISON:  None. FINDINGS: A left PICC line terminates near the caval atrial junction. No pneumothorax. The heart, hila, mediastinum, lungs, and pleura are otherwise normal. IMPRESSION: The left PICC line is in good position.  No acute abnormalities. Electronically Signed   By: Gerome Sam III M.D   On: 04/29/2020 11:19    Scheduled Meds: . amLODipine  5 mg Oral Daily  . Chlorhexidine Gluconate Cloth  6 each Topical Daily  . docusate sodium  100 mg Oral BID  . feeding supplement (ENSURE ENLIVE)  237 mL Oral BID BM  . lisinopril  10 mg Oral Daily  . nutrition supplement (JUVEN)  1 packet Oral BID BM  . oxyCODONE  15 mg Oral Q12H  . pantoprazole (PROTONIX) IV  40 mg Intravenous QHS  . sodium chloride flush  10-40 mL Intracatheter Q12H  . sodium chloride flush  3 mL Intravenous Q12H   Continuous Infusions: . sodium chloride    . sodium chloride    .  ceFAZolin (ANCEF) IV 2 g (04/29/20 1122)  . methocarbamol (ROBAXIN) IV 500 mg (04/28/20 0349)     LOS: 6 days   Rickey Barbara, MD [facility] Pager On Amion  If 7PM-7AM, please contact night-coverage 04/29/2020, 2:46 PM

## 2020-04-29 NOTE — Progress Notes (Signed)
Patient ID: Debra Schmidt, female   DOB: [birth year], 60 y.o.   MRN: [id number] Pt c/o back pain, no leg pain or NTW, dressing has some brownish drainage but incision looks very good when bandage removed, no swelling or redness. Continue abx.

## 2020-04-30 ENCOUNTER — Inpatient Hospital Stay (HOSPITAL_COMMUNITY): Payer: Medicaid Other

## 2020-04-30 LAB — AEROBIC/ANAEROBIC CULTURE W GRAM STAIN (SURGICAL/DEEP WOUND)

## 2020-04-30 LAB — CULTURE, BLOOD (ROUTINE X 2)
Culture: NO GROWTH
Culture: NO GROWTH
Special Requests: ADEQUATE
Special Requests: ADEQUATE

## 2020-04-30 LAB — LACTIC ACID, PLASMA: Lactic Acid, Venous: 0.8 mmol/L (ref 0.5–1.9)

## 2020-04-30 LAB — COMPREHENSIVE METABOLIC PANEL
ALT: 10 U/L (ref 0–44)
AST: 29 U/L (ref 15–41)
Albumin: 1.6 g/dL — ABNORMAL LOW (ref 3.5–5.0)
Alkaline Phosphatase: 86 U/L (ref 38–126)
Anion gap: 9 (ref 5–15)
BUN: <5 mg/dL — ABNORMAL LOW (ref 6–20)
CO2: 25 mmol/L (ref 22–32)
Calcium: 8.2 mg/dL — ABNORMAL LOW (ref 8.9–10.3)
Chloride: 102 mmol/L (ref 98–111)
Creatinine, Ser: 0.55 mg/dL (ref 0.44–1.00)
GFR calc Af Amer: >60 mL/min (ref >60)
GFR calc non Af Amer: >60 mL/min (ref >60)
Glucose, Bld: 102 mg/dL — ABNORMAL HIGH (ref 70–99)
Potassium: 3.8 mmol/L (ref 3.5–5.1)
Sodium: 136 mmol/L (ref 135–145)
Total Bilirubin: 0.7 mg/dL (ref 0.3–1.2)
Total Protein: 6.1 g/dL — ABNORMAL LOW (ref 6.5–8.1)

## 2020-04-30 LAB — LIPASE, BLOOD: Lipase: 137 U/L — ABNORMAL HIGH (ref 11–51)

## 2020-04-30 LAB — MAGNESIUM: Magnesium: 1.6 mg/dL — ABNORMAL LOW (ref 1.7–2.4)

## 2020-04-30 IMAGING — US US ABDOMEN LIMITED
1 series · 14 of 25 positions shown · non-contrast
Comparison: None.

CLINICAL DATA: Pancreatitis.

EXAM:
ULTRASOUND ABDOMEN LIMITED RIGHT UPPER QUADRANT

[Series 1: us abdomen limited ruq · 14 of 55 slices shown]
[im 1/55]
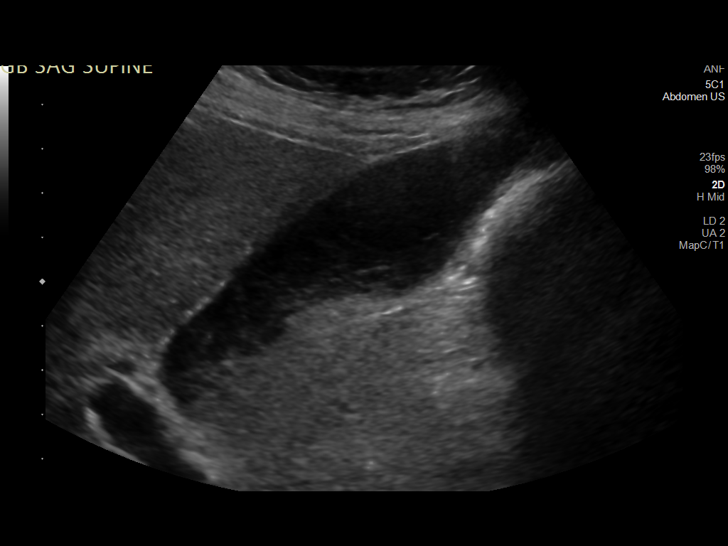
[im 5/55]
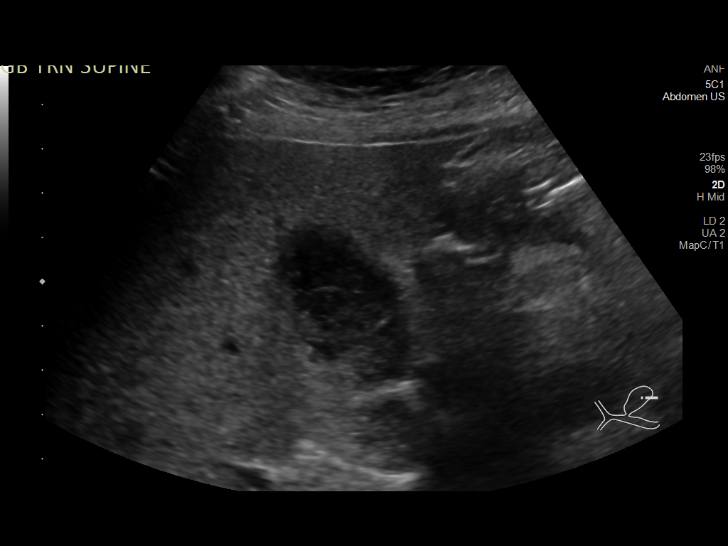
[im 10/55]
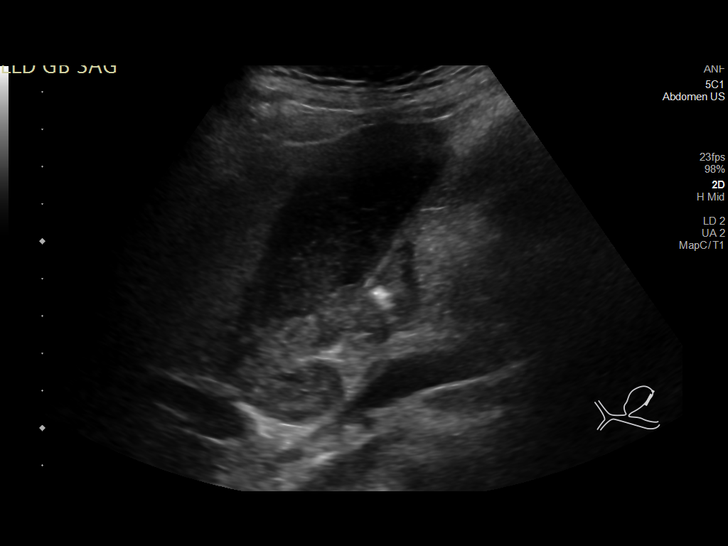
[im 14/55]
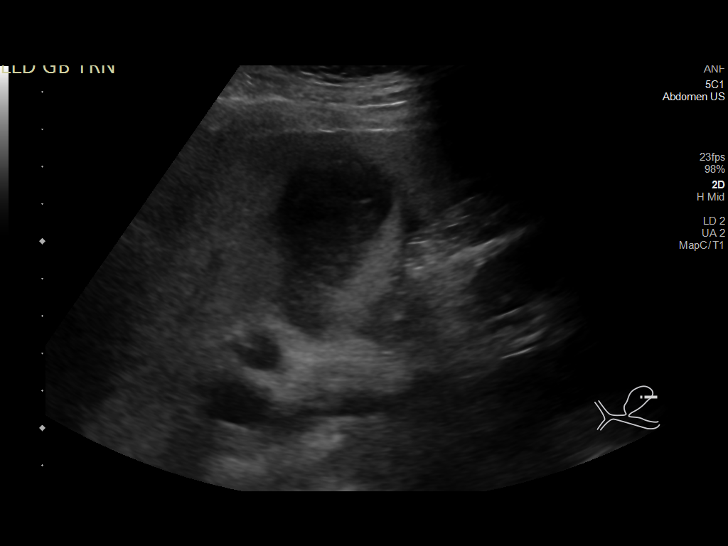
[im 19/55]
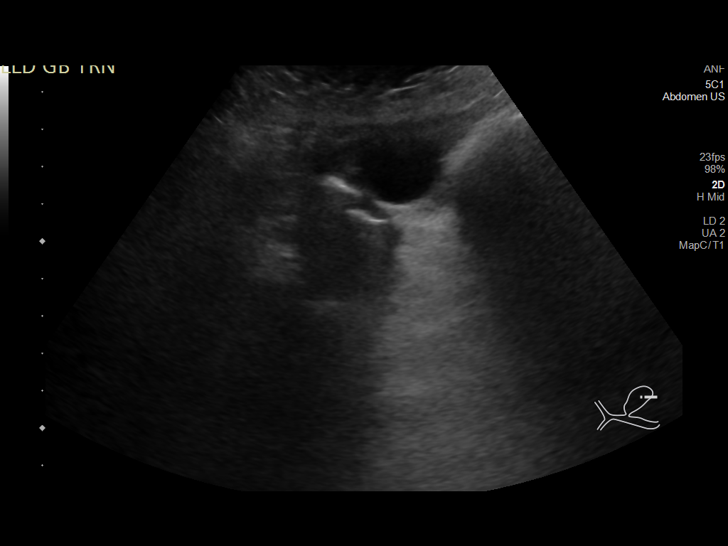
[im 21/55]
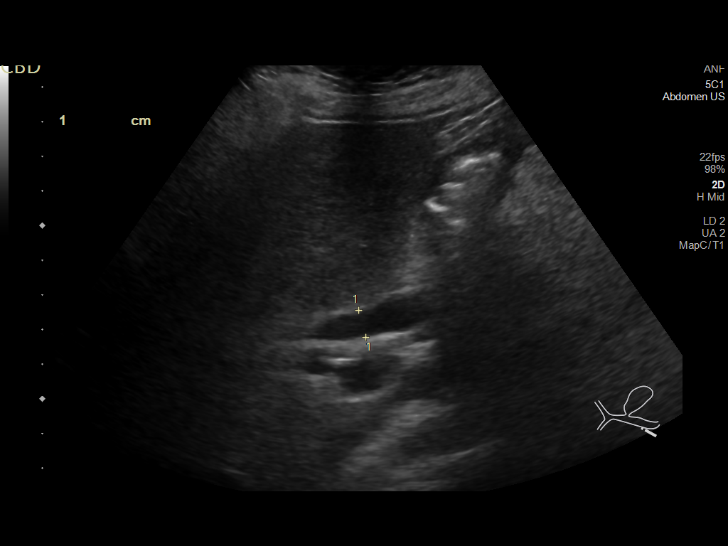
[im 25/55]
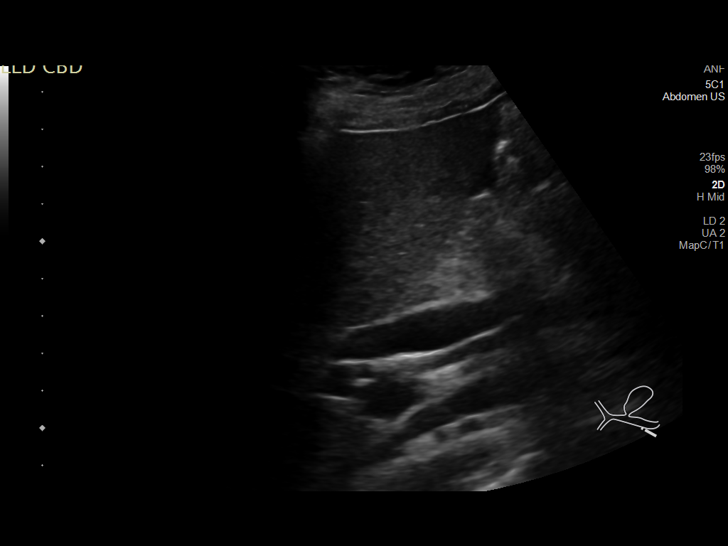
[im 30/55]
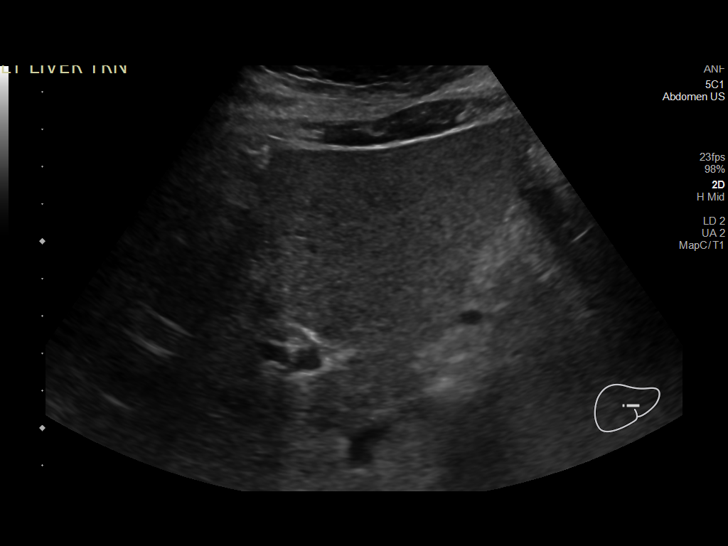
[im 34/55]
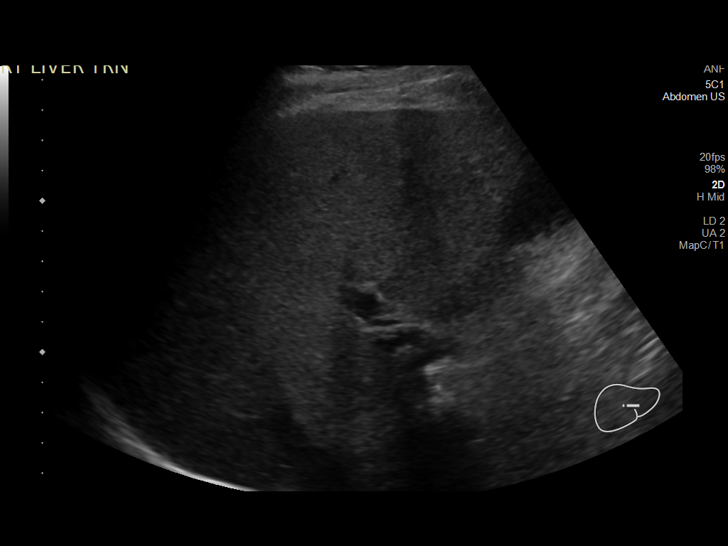
[im 37/55]
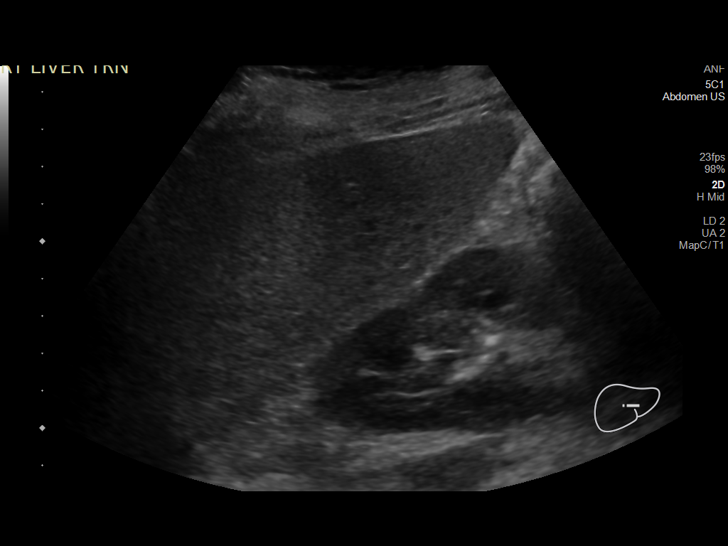
[im 41/55]
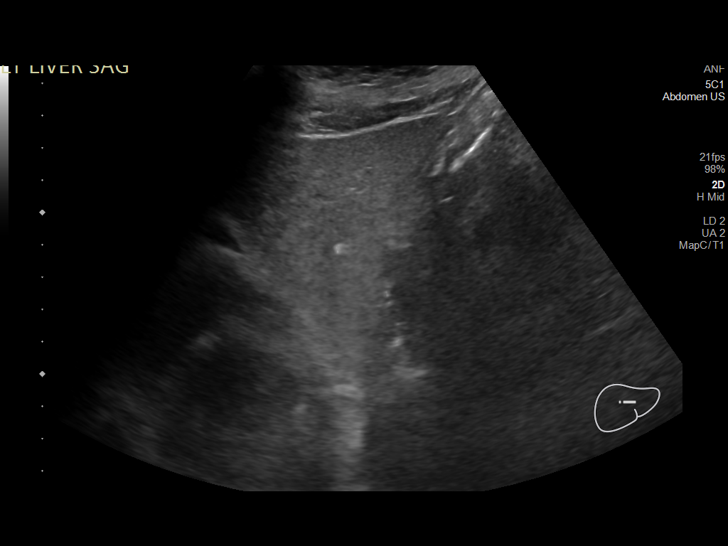
[im 46/55]
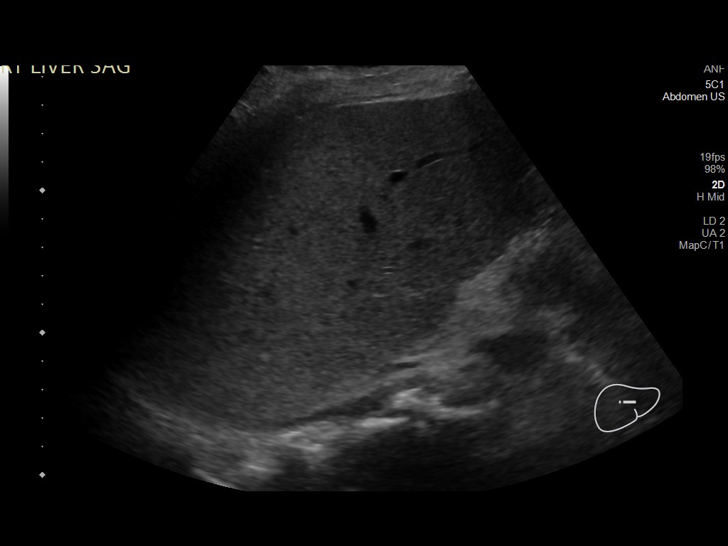
[im 50/55]
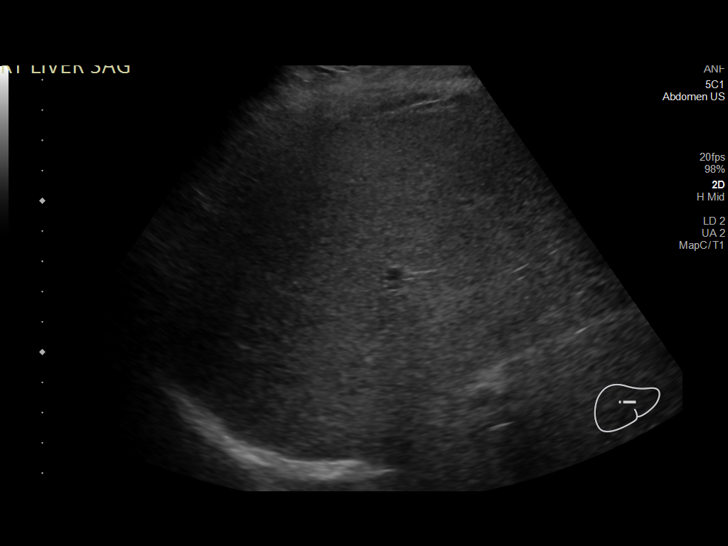
[im 55/55]
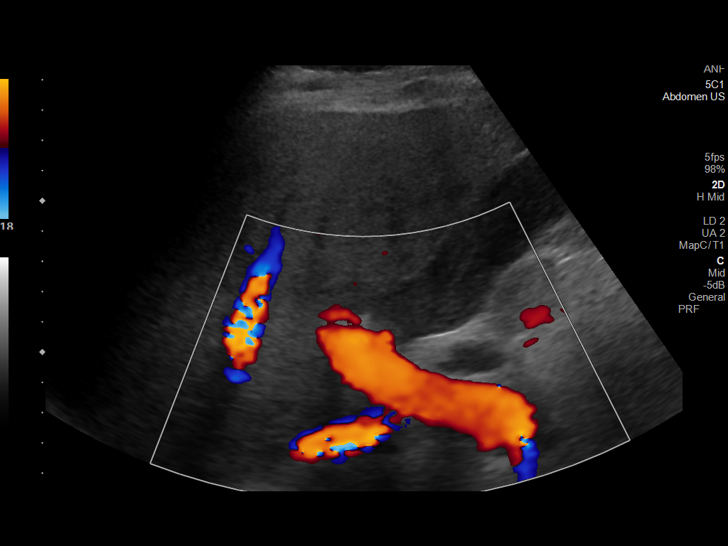

[14 of 25 positions shown; findings below may reference images not displayed]

FINDINGS: Gallbladder:

Moderate gallbladder sludge and possible small nonshadowing stones.
Negative sonographic Murphy sign. No significant gallbladder wall
thickening. No adjacent free fluid.

Common bile duct:

Diameter: 9.3 [PT] cm.  No evidence of choledocholithiasis.

Liver:

Mild increased parenchymal echogenicity likely due to a degree of
steatosis without focal mass. Portal vein is patent on color Doppler
imaging with normal direction of blood flow towards the liver.

Other: Trace perihepatic fluid.
IMPRESSION: 1. Moderate gallbladder sludge and possible small nonshadowing
stones. No additional sonographic evidence to suggest acute
cholecystitis.

2. Dilated common bile duct measuring up to 1.2 cm. No definite
evidence of choledocholithiasis. MRCP or ERCP may be helpful for
further evaluation.

3.  Mild hepatic steatosis.

## 2020-04-30 IMAGING — MR MR MRCP
4 of 6 series · 10 of 48 positions shown · non-contrast
Comparison: Multiple exams, including abdominal ultrasound
[DATE] CT abdomen [DATE]

CLINICAL DATA: Abdominal pain, biliary obstruction suspected.

EXAM:
MRI ABDOMEN WITHOUT CONTRAST  (INCLUDING MRCP)
TECHNIQUE: Multiplanar multisequence MR imaging of the abdomen was performed.
Heavily T2-weighted images of the biliary and pancreatic ducts were
obtained, and three-dimensional MRCP images were rendered by post
processing.

[Series 2: cor ssfse rt · coronal · 6.0mm · 0.70mm/px · 3 of 35 slices shown]
[im 5/35]
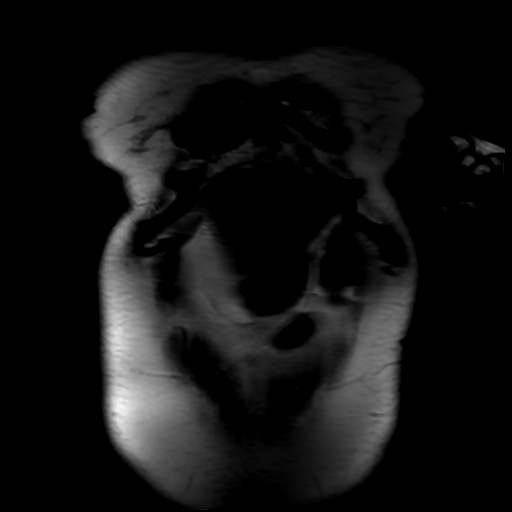
[im 20/35]
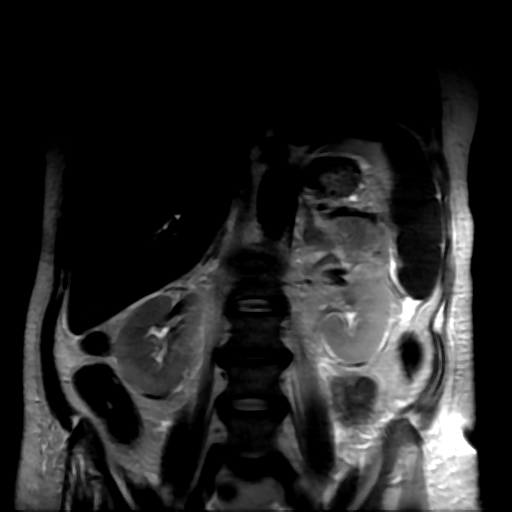
[im 30/35]
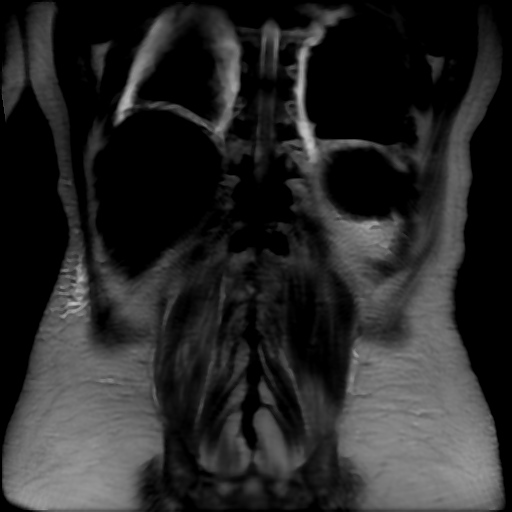

[Series 3: ax ssfse nav · axial · 6.0mm · 0.74mm/px · z∈[-141,+39]mm · 3 of 42 slices shown]
[im 6/42]
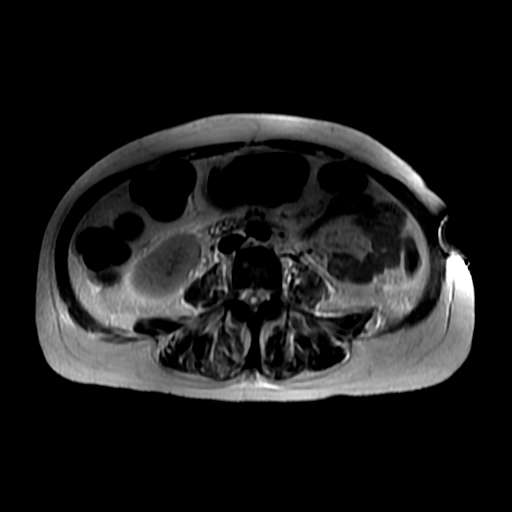
[im 21/42]
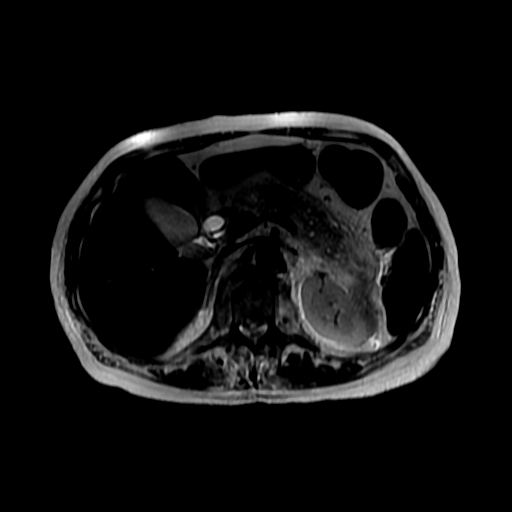
[im 36/42]
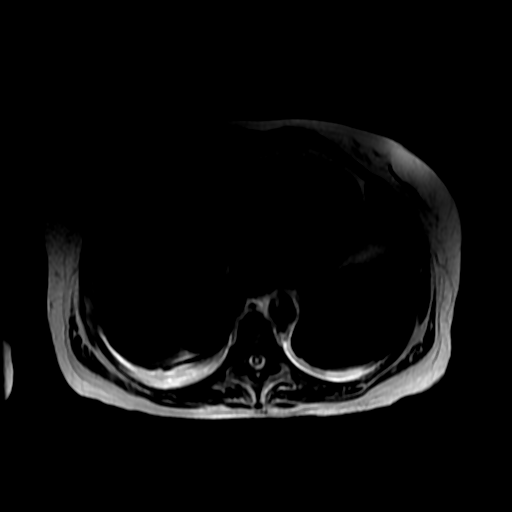

[Series 4: T2 fat-sat · axial · 6.0mm · 0.74mm/px · z∈[-137,+35]mm · 3 of 38 slices shown]
[im 6/38]
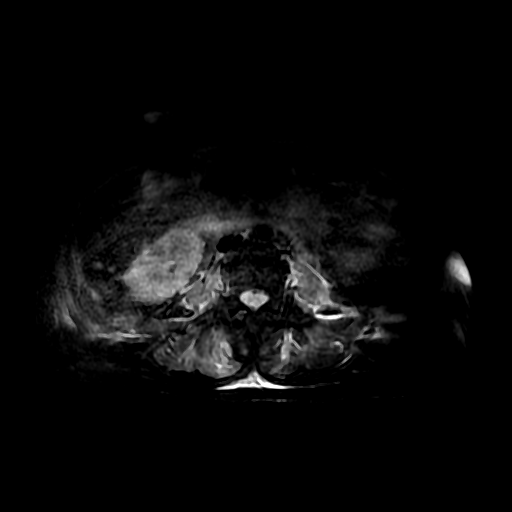
[im 22/38]
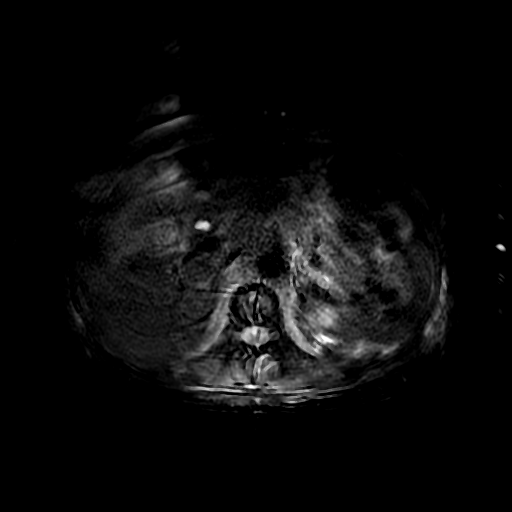
[im 32/38]
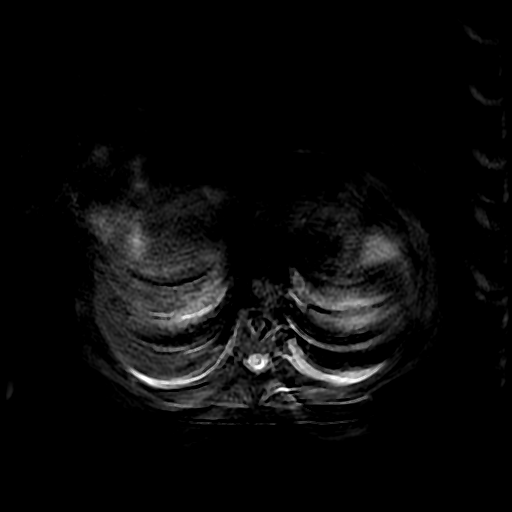

[Series 550: projection images · axial · 2.0mm · 0.30mm/px · 1 of 11 slices shown]
[im 1/11]
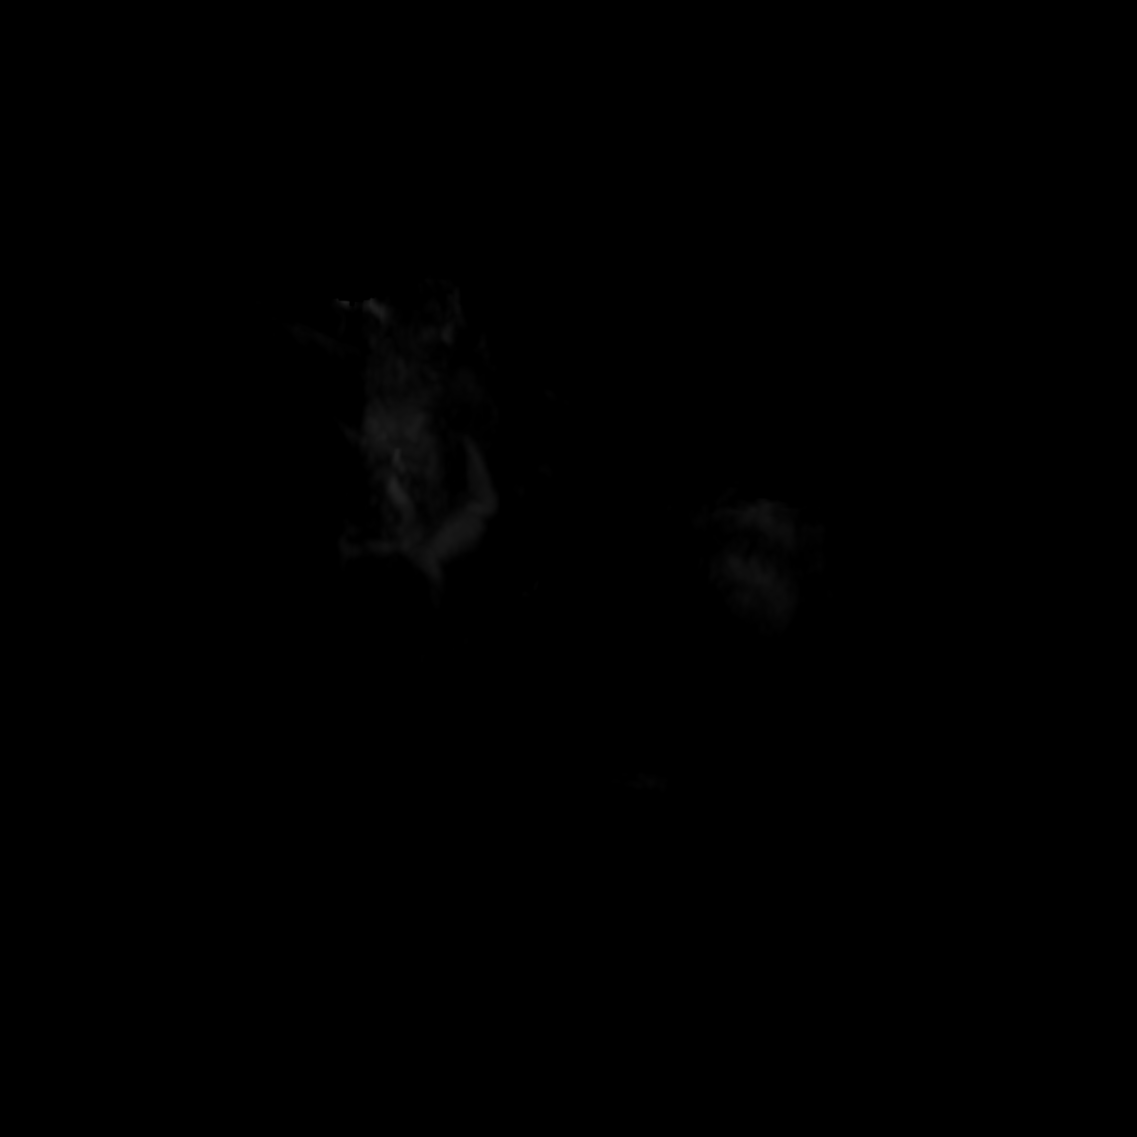

[10 of 48 positions shown; findings below may reference images not displayed]

FINDINGS: Despite efforts by the technologist and patient, motion artifact is
present on today's exam and could not be eliminated. This reduces
exam sensitivity and specificity. This is a common result when MRCP
is attempted in the inpatient setting where patients are less likely
to be able to breath hold and cooperate in controlling motion.

Lower chest: Trace bilateral pleural effusions.

Hepatobiliary: Dependent speckled low T2 signal intensity in the
gallbladder for example on image [DATE] suspicious for small
gallstones. No gallbladder wall thickening. The common hepatic duct
measures up to 1 cm in diameter. Mildly low position of the cystic
duct attachment to the biliary tree, maximum common bile duct
diameter about 0.7 cm, mildly prominent. I not observe a definite
common bile duct or common hepatic duct filling defect or stone.
Borderline intrahepatic biliary dilatation. No appreciable
gallbladder wall thickening.

Pancreas: Peripancreatic edema compatible with acute pancreatitis.
Pancreas divisum. Known findings of chronic calcific pancreatitis
based on CT scan.

Spleen:  Unremarkable

Adrenals/Urinary Tract:  Unremarkable

Stomach/Bowel: Unremarkable

Vascular/Lymphatic:  Unremarkable

Other:  No supplemental non-categorized findings.

Musculoskeletal: Abnormal paraspinal edema along the psoas muscles
and posterior paraspinal musculature, epicenter at the L1-2 level
where there appears to be discitis-osteomyelitis. Extending cephalad
from this level, there is abnormal displacement of the spinal cord
to the left and fluid signal intensity in the right spinal canal,
suspicious for right eccentric epidural abscess. There also seems to
be abnormal signal centrally in the thoracic spinal cord itself for
example on image [DATE].
IMPRESSION: 1. Acute-on-chronic pancreatitis. Pancreas divisum.
2. Mild biliary dilatation with the common hepatic duct measuring up
to 1 cm in diameter. No definite choledocholithiasis. There is a low
position of the cystic duct attachment to the biliary tree.
3. L1-2 discitis osteomyelitis with paraspinal muscular edema,
epidural abscess extending cephalad in the spinal canal, and
abnormal signal centrally in the thoracic spinal cord. These
findings are roughly similar to those shown on MRI examinations of 1
week ago, although were previously better depicted due to the
focused nature of the prior spine exams.
4. Trace bilateral pleural effusions.
5. Suspected small gallstones.
6. Despite efforts by the technologist and patient, motion artifact
is present on today's exam and could not be eliminated. This reduces
exam sensitivity and specificity. This is a common result when
patients are attempted in the setting where patients are less likely
to be able to breath hold and cooperate in controlling motion.

## 2020-04-30 MED ORDER — POTASSIUM CHLORIDE CRYS ER 20 MEQ PO TBCR
60.0000 meq | EXTENDED_RELEASE_TABLET | Freq: Once | ORAL | Status: AC
  Administered 2020-04-30: 60 meq via ORAL
  Filled 2020-04-30: qty 3

## 2020-04-30 MED ORDER — MAGNESIUM SULFATE 4 GM/100ML IV SOLN
4.0000 g | Freq: Once | INTRAVENOUS | Status: AC
  Administered 2020-04-30: 4 g via INTRAVENOUS
  Filled 2020-04-30: qty 100

## 2020-04-30 NOTE — Consult Note (Signed)
[facility] Consult  Referring Provider: Dr.Chiu/[facility] Primary Care Physician:  Patient, No Pcp Per Primary Gastroenterologist: Unassigned  Reason for Consultation: Pancreatitis  HPI: Debra Schmidt is a 59 y.o. female was admitted on 04/23/2020 with thoracolumbar epidural abscess, septic arthritis of right AC joint, osteomyelitis, underwent emergent L1-LGentry Fitz2 laminectomy with evacuation of epidural abscess, noted to have MSSA bacteremia was noted to have tachycardia and tachypnea. Subsequently CT angiogram of the chest was performed yesterday which incidentally showed inflammatory changes of the pancreas consistent with acute pancreatitis with scattered calcification noted throughout pancreas, sequelae of chronic pancreatitis in background of fatty liver with trace layering gallbladder sludge versus small stones. Lipase was minimally elevated between 1 37-1 40. Liver enzymes normal, T bili 0.7/AST 29/ALT 10/ALP 86.  As per patient and the husband at bedside there is no history of alcohol use. No prior history of pancreatitis, triglycerides are within normal limits. Patient denies abdominal pain, nausea or vomiting. At home she is normally constipated but denies issues with blood in stool, black stool, difficulty swallowing, pain on swallowing, early satiety or bloating. No prior EGD or colonoscopy. Denies family history of colon cancer. Denies family history of pancreatic disease.  History reviewed. No pertinent past medical history.  Past Surgical History:  Procedure Laterality Date  . ACROMIO-CLAVICULAR JOINT REPAIR Right 04/25/2020   Procedure: ACROMIO-CLAVICULAR JOINT REPAIR;  Surgeon: Sheral ApleyMurphy, Timothy D, MD;  [facility];  Service: Orthopedics;  Laterality: Right;  . I & D EXTREMITY Right 04/25/2020   Procedure: Open debridement R AC joint; Distal clavicle repair;  Surgeon: Sheral ApleyMurphy, Timothy D, MD;  [facility];  Service: Orthopedics;  Laterality: Right;  .  LUMBAR LAMINECTOMY/DECOMPRESSION MICRODISCECTOMY N/A 04/23/2020   Procedure: Lumbar One-Two LAMINECTOMY for Epidural Abscess Evacuation;  Surgeon: Coletta Memosabbell, Kyle, MD;  [facility];  Service: Neurosurgery;  Laterality: N/A;  . RESECTION DISTAL CLAVICAL Right 04/25/2020   Procedure: RESECTION DISTAL CLAVICAL;  Surgeon: Sheral ApleyMurphy, Timothy D, MD;  Location: [facility] OR;  Service: Orthopedics;  Laterality: Right;    Prior to Admission medications   Medication Sig Start Date End Date Taking? Authorizing Provider  ibuprofen (ADVIL) 200 MG tablet Take 200-800 mg by mouth every 6 (six) hours as needed for moderate pain.   Yes [provider]  phenazopyridine (PYRIDIUM) 95 MG tablet Take 95 mg by mouth 3 (three) times daily as needed for pain.   Yes [provider]  docusate sodium (STOOL SOFTENER) 100 MG capsule Take 100 mg by mouth daily as needed for mild constipation. Patient not taking: Reported on 04/23/2020    [provider]  methocarbamol (ROBAXIN) 500 MG tablet Take 1 tablet (500 mg total) by mouth every 8 (eight) hours as needed for muscle spasms. Patient not taking: Reported on 04/23/2020 02/25/20   Benjiman CorePickering, Nathan, MD  potassium chloride SA (KLOR-CON) 20 MEQ tablet Take 1 tablet (20 mEq total) by mouth 2 (two) times daily. Patient not taking: Reported on 04/23/2020 02/25/20   Benjiman CorePickering, Nathan, MD    Current Facility-Administered Medications  Medication Dose Route Frequency Provider Last Rate Last Admin  . 0.9 %  sodium chloride infusion  250 mL Intravenous Continuous Martensen, Lucretia KernHenry Calvin III, PA-C      . 0.9 %  sodium chloride infusion   Intravenous Continuous Martensen, Lucretia KernHenry Calvin III, PA-C      . acetaminophen (TYLENOL) tablet 650 mg  650 mg Oral Q4H PRN Albina BilletMartensen, Henry Calvin III, PA-C   650 mg at 04/27/20 0800  Or  . acetaminophen (TYLENOL) suppository 650 mg  650 mg Rectal Q4H PRN Albina Billet III, PA-C      . amLODipine (NORVASC) tablet 5 mg  5 mg  Oral Daily Albina Billet III, PA-C   5 mg at 04/30/20 1055  . bisacodyl (DULCOLAX) EC tablet 5 mg  5 mg Oral Daily PRN Albina Billet III, PA-C   5 mg at 04/24/20 0958  . ceFAZolin (ANCEF) IVPB 2g/100 mL premix  2 g Intravenous Q8H Della Goo, RPH 200 mL/hr at 04/30/20 0411 2 g at 04/30/20 0411  . Chlorhexidine Gluconate Cloth 2 % PADS 6 each  6 each Topical Daily Albina Billet III, PA-C   6 each at 04/30/20 1321  . diazepam (VALIUM) tablet 5 mg  5 mg Oral Q6H PRN Albina Billet III, PA-C   5 mg at 04/30/20 0535  . docusate sodium (COLACE) capsule 100 mg  100 mg Oral BID Albina Billet III, PA-C   100 mg at 04/30/20 1055  . feeding supplement (ENSURE ENLIVE) (ENSURE ENLIVE) liquid 237 mL  237 mL Oral BID BM Coletta Memos, MD   237 mL at 04/30/20 1022  . hydrALAZINE (APRESOLINE) injection 5-10 mg  5-10 mg Intravenous Q1H PRN Meyran, Tiana Loft, NP   5 mg at 04/27/20 0419  . lactated ringers infusion   Intravenous Continuous Jerald Kief, MD 125 mL/hr at 04/30/20 0408 New Bag at 04/30/20 0408  . lisinopril (ZESTRIL) tablet 10 mg  10 mg Oral Daily Jerald Kief, MD   10 mg at 04/30/20 1055  . magnesium citrate solution 1 Bottle  1 Bottle Oral Once PRN Albina Billet III, PA-C      . menthol-cetylpyridinium (CEPACOL) lozenge 3 mg  1 lozenge Oral PRN Albina Billet III, PA-C       Or  . phenol (CHLORASEPTIC) mouth spray 1 spray  1 spray Mouth/Throat PRN Albina Billet III, PA-C      . methocarbamol (ROBAXIN) 500 mg in dextrose 5 % 50 mL IVPB  500 mg Intravenous Q6H PRN Sherryl Manges, NP 100 mL/hr at 04/28/20 0349 500 mg at 04/28/20 0349   Or  . methocarbamol (ROBAXIN) tablet 500 mg  500 mg Oral Q6H PRN Sherryl Manges, NP   500 mg at 04/30/20 0909  . metoprolol tartrate (LOPRESSOR) injection 5 mg  5 mg Intravenous Q5 min PRN Jerald Kief, MD      . morphine 2 MG/ML injection 2 mg  2 mg  Intravenous Q2H PRN Albina Billet III, PA-C   2 mg at 04/28/20 0956  . nutrition supplement (JUVEN) (JUVEN) powder packet 1 packet  1 packet Oral BID BM Coletta Memos, MD   1 packet at 04/30/20 1054  . ondansetron (ZOFRAN) tablet 4 mg  4 mg Oral Q6H PRN Albina Billet III, PA-C       Or  . ondansetron [facility] injection 4 mg  4 mg Intravenous Q6H PRN Albina Billet III, PA-C      . oxyCODONE (Oxy IR/ROXICODONE) immediate release tablet 10 mg  10 mg Oral Q3H PRN Albina Billet III, PA-C   10 mg at 04/30/20 0909  . oxyCODONE (Oxy IR/ROXICODONE) immediate release tablet 5 mg  5 mg Oral Q3H PRN Albina Billet III, PA-C      . oxyCODONE (OXYCONTIN) 12 hr tablet 15 mg  15 mg Oral Q12H Martensen, Lucretia Kern III, PA-C  15 mg at 04/30/20 0909  . pantoprazole (PROTONIX) injection 40 mg  40 mg Intravenous QHS Prudencio Burly III, PA-C   40 mg at 04/29/20 2057  . phenazopyridine (PYRIDIUM) tablet 100 mg  100 mg Oral TID PRN Prudencio Burly III, PA-C   100 mg at 04/25/20 2134  . senna-docusate (Senokot-S) tablet 1 tablet  1 tablet Oral QHS PRN Prudencio Burly III, PA-C   1 tablet at 04/27/20 0801  . sodium chloride flush (NS) 0.9 % injection 10-40 mL  10-40 mL Intracatheter PRN Prudencio Burly III, PA-C      . sodium chloride flush (NS) 0.9 % injection 10-40 mL  10-40 mL Intracatheter Q12H Ashok Pall, MD   10 mL at 04/29/20 1112  . sodium chloride flush (NS) 0.9 % injection 10-40 mL  10-40 mL Intracatheter PRN Ashok Pall, MD      . sodium chloride flush (NS) 0.9 % injection 3 mL  3 mL Intravenous Q12H Prudencio Burly III, PA-C   3 mL at 04/29/20 1112  . sodium chloride flush (NS) 0.9 % injection 3 mL  3 mL Intravenous PRN Prudencio Burly III, PA-C   3 mL at 04/29/20 1111  . zolpidem (AMBIEN) tablet 5 mg  5 mg Oral QHS PRN Prudencio Burly III, PA-C   5 mg at 04/29/20 2057    Allergies as of 04/23/2020  - Review Complete 04/23/2020  Allergen Reaction Noted  . Penicillins Hives 02/24/2020    Family History  Problem Relation Age of Onset  . Cancer Mother   . Cancer Father     Social History   Socioeconomic History  . Marital status: Legally Separated    Spouse name: Not on file  . Number of children: Not on file  . Years of education: Not on file  . Highest education level: Not on file  Occupational History  . Not on file  Tobacco Use  . Smoking status: Current Every Day Smoker    Packs/day: 0.50    Types: Cigarettes  . Smokeless tobacco: Never Used  Substance and Sexual Activity  . Alcohol use: Not Currently  . Drug use: Not Currently  . Sexual activity: Not on file  Other Topics Concern  . Not on file  Social History Narrative  . Not on file   Social Determinants of Health   Financial Resource Strain:   . Difficulty of Paying Living Expenses:   Food Insecurity:   . Worried About Charity fundraiser in the Last Year:   . Arboriculturist in the Last Year:   Transportation Needs:   . Film/video editor (Medical):   Marland Kitchen Lack of Transportation (Non-Medical):   Physical Activity:   . Days of Exercise per Week:   . Minutes of Exercise per Session:   Stress:   . Feeling of Stress :   Social Connections:   . Frequency of Communication with Friends and Family:   . Frequency of Social Gatherings with Friends and Family:   . Attends Religious Services:   . Active Member of Clubs or Organizations:   . Attends Archivist Meetings:   Marland Kitchen Marital Status:   Intimate Partner Violence:   . Fear of Current or Ex-Partner:   . Emotionally Abused:   Marland Kitchen Physically Abused:   . Sexually Abused:     Review of Systems: Positive for: GI: Described in detail in HPI.    Gen: anorexia, fatigue, weakness, malaise, Denies  any fever, chills, rigors, night sweats, involuntary weight loss, and sleep disorder CV: Denies chest pain, angina, palpitations, syncope, orthopnea, PND,  peripheral edema, and claudication. Resp: Denies dyspnea, cough, sputum, wheezing, coughing up blood. MS: Right shoulder and back pain  Derm: Denies rash, itching, oral ulcerations, hives, unhealing ulcers.  Psych: Denies depression, anxiety, memory loss, suicidal ideation, hallucinations,  and confusion. Heme: Denies bruising, bleeding, and enlarged lymph nodes. Neuro:  Denies any headaches, dizziness, paresthesias. Endo:  Denies any problems with DM, thyroid, adrenal function.  Physical Exam: Vital signs in last 24 hours: Temp:  [98.1 F (36.7 C)-100.1 F (37.8 C)] 98.1 F (36.7 C) (06/20 0749) Pulse Rate:  [97-115] 97 (06/20 0423) Resp:  [20-40] 20 (06/20 0749) BP: (139-150)/(79-87) 142/80 (06/20 0749) SpO2:  [93 %-99 %] 99 % (06/20 0749) Last BM Date:  (PTA )  General: Sick appearing, lying on bed Head:  Normocephalic and atraumatic. Eyes: Prominent pallor, no icterus. Ears:  Normal auditory acuity. Nose:  No deformity, discharge,  or lesions. Mouth:  No deformity or lesions.  Oropharynx pink & moist. Neck:  Supple; no masses or thyromegaly. Lungs:  Clear throughout to auscultation.   No wheezes, crackles, or rhonchi. No acute distress. Heart: Tachycardic Extremities: Dressing over right shoulder Neurologic:  Alert and  oriented x4;  grossly normal neurologically. Skin:  Intact without significant lesions or rashes. Psych:  Alert and cooperative. Normal mood and affect. Abdomen:  Soft, mild generalized tenderness. No masses, hepatosplenomegaly or hernias noted. Normal bowel sounds, without guarding, and without rebound.         Lab Results: No results for input(s): WBC, HGB, HCT, PLT in the last 72 hours. BMET Recent Labs    04/28/20 0522 04/29/20 0544 04/30/20 0134  NA 134* 136 136  K 3.2* 3.9 3.8  CL 98 102 102  CO2 25 22 25   GLUCOSE 105* 109* 102*  BUN <5* 5* <5*  CREATININE 0.55 0.57 0.55  CALCIUM 8.0* 8.3* 8.2*   LFT Recent Labs    04/30/20 0134   PROT 6.1*  ALBUMIN 1.6*  AST 29  ALT 10  ALKPHOS 86  BILITOT 0.7   PT/INR No results for input(s): LABPROT, INR in the last 72 hours.  Studies/Results: CT ANGIO CHEST PE W OR WO CONTRAST  Result Date: 04/29/2020 CLINICAL DATA:  59 year old female with concern for pulmonary embolism. EXAM: CT ANGIOGRAPHY CHEST WITH CONTRAST TECHNIQUE: Multidetector CT imaging of the chest was performed using the standard protocol during bolus administration of intravenous contrast. Multiplanar CT image reconstructions and MIPs were obtained to evaluate the vascular anatomy. CONTRAST:  79mL OMNIPAQUE IOHEXOL 350 MG/ML SOLN COMPARISON:  Chest radiograph dated 04/29/2020. FINDINGS: Cardiovascular: There is no cardiomegaly or pericardial effusion. The thoracic aorta is unremarkable. The origins of the great vessels of the aortic arch appear patent as visualized. A central venous line noted with tip over central SVC close to the cavoatrial junction. No CT evidence of pulmonary embolism. Mediastinum/Nodes: There is no hilar or mediastinal adenopathy. Several small mediastinal lymph nodes. The esophagus and the thyroid gland are grossly unremarkable. No mediastinal fluid collection. Lungs/Pleura: Small right and trace left pleural effusions. There is associated partial compressive atelectasis of the lower lobes. There is no pneumothorax. The central airways are patent. Upper Abdomen: There is inflammatory changes of the pancreas consistent with acute pancreatitis. Correlation with clinical exam and pancreatic enzymes recommended. Scattered calcific foci throughout the pancreas likely sequela of chronic inflammation. Probable background of fatty liver. Trace layering  gallbladder sludge versus small stones. Musculoskeletal: Irregularity and destructive changes of the inferior L1 and superior L2 endplate and disc with associated prevertebral density most consistent with osteomyelitis/discitis. Clinical correlation is  recommended. This is suboptimally evaluated and partially visualized on this CT and better evaluated on the recent MRI. Review of the MIP images confirms the above findings. IMPRESSION: 1. No CT evidence of pulmonary embolism. 2. Findings most consistent with acute pancreatitis. Correlation with clinical exam and pancreatic enzymes recommended. 3. Small right and trace left pleural effusions with associated partial compressive atelectasis of the lower lobes. 4. 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 Electronically Signed   By: Elgie Collard M.D.   On: 04/29/2020 16:31   DG CHEST PORT 1 VIEW  Result Date: 04/29/2020 CLINICAL DATA:  Tachypnea. EXAM: PORTABLE CHEST 1 VIEW COMPARISON:  None. FINDINGS: A left PICC line terminates near the caval atrial junction. No pneumothorax. The heart, hila, mediastinum, lungs, and pleura are otherwise normal. IMPRESSION: The left PICC line is in good position.  No acute abnormalities. Electronically Signed   By: Gerome Sam III M.D   On: 04/29/2020 11:19   US Abdomen Limited RUQ  Result Date: 04/30/2020 CLINICAL DATA:  Pancreatitis. EXAM: ULTRASOUND ABDOMEN LIMITED RIGHT UPPER QUADRANT COMPARISON:  None. FINDINGS: Gallbladder: Moderate gallbladder sludge and possible small nonshadowing stones. Negative sonographic Murphy sign. No significant gallbladder wall thickening. No adjacent free fluid. Common bile duct: Diameter: 9.3 mm-1.2 cm.  No evidence of choledocholithiasis. Liver: Mild increased parenchymal echogenicity likely due to a degree of steatosis without focal mass. Portal vein is patent on color Doppler imaging with normal direction of blood flow towards the liver. Other: Trace perihepatic fluid. IMPRESSION: 1. Moderate gallbladder sludge and possible small nonshadowing stones. No additional sonographic evidence to suggest acute cholecystitis. 2. Dilated common bile duct measuring up to 1.2 cm. No definite evidence of choledocholithiasis. MRCP or ERCP may be  helpful for further evaluation. 3.  Mild hepatic steatosis. Electronically Signed   By: Elberta Fortis M.D.   On: 04/30/2020 12:35    Impression: Acute on chronic pancreatitis noted on CAT scan with minimally elevated lipase of 140 and normal liver enzymes Ultrasound showed moderate gallbladder sludge, small nonshadowing stones without evidence of acute cholecystitis but dilated CBD of 1.2 cm.  No CBD stone noted but MRCP/ERCP recommended, mild hepatic steatosis  Tachycardia noted Normal renal function   Plan: Patient currently n.p.o. and lactated Ringer's at 125 mL/h, was given 1 L bolus yesterday. MRCP has been ordered without contrast We will follow up results, normal liver enzymes reassuring. Recommend continue IV fluid resuscitation    LOS: 7 days   Kerin Salen, MD  04/30/2020, 2:21 PM

## 2020-04-30 NOTE — Progress Notes (Signed)
PROGRESS NOTE    Debra Schmidt  MGQ:[id number] DOB: 19-Feb-1961 DOA: 04/23/2020 PCP: Patient, No Pcp Per    Brief Narrative:  59 y.o.female with remote history of Xanax and hydrocodone use presented to the ED 6/13 , she states that she fell in the bathtub several weeks ago injuring her right shoulder, eventually developed a draining wound on the top of her shoulder. She began to have progressively more severe back pain left urinary incontinence leading to admission 6/13. MRI showed L1-2 discitis with thoracolumbar abscess. She underwent emergent surgery by Dr. 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 ID to f/u in clinic next month -Underwent L1/2 laminectomy, epidural abscess evacuation on 6/13 -Follow-up OR cultures, anticipate MSSA -Pt underwent I&D and distal clavicle resection 6/15 per ortho  Chronic hypokalemia and hypomagnesemia -Etiology not clear although albumin is <2. Suspect marked malnutrition prior to admit. Have consulted dietitian -Chart reviewed. Pt has hx of profound hypokalemia, first documented here on 02/24/20 with a K of 2.7 -She recalls being followed for hypokalemia when she was following up with her PCP over 5 years ago. Currently not seeing PCP -random cortisol level normal -Pt has required multiple doses of  potassium replacement -Potassium now improved after multiple doses of potassium and magnesium replacement -K of 3.8 with Mg of 1.6 -Will give of KCl and 4gm more of Mg -Repeat lytes in AM  Acute on chronic anemia -The setting of severe infections and surgery -Cont to monitor  Hypertension -BP trends somewhat improved since starting lisinopril -have ordered PRN lopressor IV -Cont to titrate BP meds accordingly  Tachycardia with tachypnea -Patient attributes to post-op pain. Patient reports marked pain, described as cramping, corresponding to times of documented tachycardia -CTA chest obtained and reviewed, neg for PE, however with incidental finding of acute on chronic pancreatitis, see below -VS improved with aggressive IVF hydration  Acute on chronic pancreatitis -Noted incidentally on CTA chest  -Lipase peaked to 140, now trending down -Clinically improving with aggressive IVF hydration -Lipid panel unremarkable -RUQ [place] ordered and reviewed. Finding of 1.2cm dilated CBD with GB sludge -GI consulted, appreciate recs -Will f/u on MRCP, ordered -Repeat cmp in AM -Have discussed with Neurosurgery  DVT prophylaxis: SCD's Code Status: Full Family Communication: Pt in room, family not at bedside  Consultants:   Orthopedic surgery  ID  Procedures:  PRE-OPERATIVE DIAGNOSIS: Epidural Mass  POST-OPERATIVE DIAGNOSIS: Epidural Abscess, L1/2 discitis  PROCEDURE: Procedure(s): Lumbar One-Two LAMINECTOMY for Epidural Abscess Evacuation  SURGEON:Surgeon(s):  Coletta Memos, MD   Antimicrobials: Anti-infectives (From admission, onward)   Start     Dose/Rate Route Frequency Ordered Stop   04/25/20 1641  vancomycin (VANCOCIN) powder  Status:  Discontinued          As needed 04/25/20 1641 04/25/20 1657   04/25/20 1200  ceFAZolin (ANCEF) IVPB 2g/100 mL premix     Discontinue  2 g 200 mL/hr over 30 Minutes Intravenous Every 8 hours 04/25/20 0930 06/03/20 2359    04/24/20 1200  vancomycin (VANCOREADY) IVPB 750 mg/150 mL  Status:  Discontinued        750 mg 150 mL/hr over 60 Minutes Intravenous Every 12 hours 04/24/20 0156 04/25/20 0930   04/24/20 0800  piperacillin-tazobactam (ZOSYN) IVPB 3.375 g  Status:  Discontinued        3.375 g 12.5 mL/hr over 240 Minutes Intravenous Every 8 hours 04/24/20 0156 04/24/20 1423   04/23/20 2215  piperacillin-tazobactam (ZOSYN) IVPB 3.375 g        3.375 g 100 mL/hr over 30 Minutes Intravenous  Once 04/23/20 2202 04/24/20 0031   04/23/20 2215  vancomycin (VANCOREADY) IVPB 1250 mg/250 mL        1,250 mg 166.7 mL/hr over 90 Minutes Intravenous  Once 04/23/20 2202 04/24/20 0101      Subjective: Reports feeling better since IVF were started yesterday  Objective: Vitals:   04/29/20 2004 04/29/20 2351 04/30/20 0423 04/30/20 0749  BP: (!) 145/85 (!) 150/87 139/79 (!) 142/80  Pulse: (!) 103 100 97   Resp: (!) 27 (!) 24 (!) 34 20  Temp: 98.3 F (36.8 C) 99.1 F (37.3 C) 99.4 F (37.4 C) 98.1 F (36.7 C)  TempSrc: Oral Oral Axillary   SpO2: 95% 98% 93% 99%  Weight:      Height:        Intake/Output Summary (Last 24 hours) at 04/30/2020 1628 Last data filed at 04/30/2020 1300 Gross per 24 hour  Intake 120 ml  Output 3300 ml  Net -3180 ml   Filed Weights   04/23/20 1332  Weight: 62.6 kg    Examination: General exam: Conversant, in no acute distress Respiratory system: normal chest rise, clear, no audible wheezing Cardiovascular system: regular rhythm, s1-s2 Gastrointestinal system: Nondistended, epigastric tenderness on minimal palpation Central nervous system: No seizures, no tremors Extremities: No cyanosis, no joint deformities Skin: No rashes, no pallor Psychiatry: Affect normal // no auditory hallucinations   Data Reviewed: I have personally reviewed following labs and imaging studies  CBC: Recent Labs  Lab 04/23/20 2356 04/24/20 0605 04/25/20 0754 04/25/20 1402 04/26/20 0630  WBC   --  11.3* 14.2*  --  16.2*  HGB 8.5* 8.7* 8.9* 9.2* 8.9*  HCT 25.0* 27.3* 27.6* 27.0* 27.5*  MCV  --  84.8 84.7  --  83.6  PLT  --  366 347  --  379   Basic Metabolic Panel: Recent Labs  Lab 04/24/20 0605 04/24/20 1039 04/25/20 0528 04/25/20 0754 04/26/20 1656 04/27/20 0949 04/28/20 0522 04/29/20 0544 04/30/20 0134  NA 140  --   --    < > 138 138 134* 136 136  K 2.2*  --   --    < > 4.1 2.9* 3.2* 3.9 3.8  CL 101  --   --    < > 100 101 98 102 102  CO2 29  --   --    < > 28 27 25 22 25   GLUCOSE 120*  --   --    < > 96 112* 105* 109* 102*  BUN <5*  --   --    < > 5* 5* <5* 5* <5*  CREATININE 0.69  --   --    < > 0.55 0.56 0.55 0.57 0.55  CALCIUM 8.8*  --   --    < > 8.2* 8.2* 8.0* 8.3* 8.2*  MG 1.8  --   --   --   --  1.3* 1.8 1.8 1.6*  PHOS  --  3.8 2.3*  --   --   --   --   --   --    < > = values in this interval not displayed.   GFR: Estimated Creatinine Clearance: 65.4 mL/min (by C-G formula based on SCr of 0.55 mg/dL). Liver Function Tests: Recent Labs  Lab 04/24/20 0605 04/27/20 0949 04/28/20 0522 04/29/20 0544 04/30/20 0134  AST ALT ALKPHOS 64 71 74 70 86  BILITOT 0.7 0.6 0.3 0.6 0.7  PROT 6.4* 6.4* 6.4* 6.2* 6.1*  ALBUMIN 1.9* 1.8* 1.8* 1.7* 1.6*   Recent Labs  Lab 04/29/20 1722 04/30/20 0134  LIPASE 140* 137*   No results for input(s): AMMONIA in the last 168 hours. Coagulation Profile: No results for input(s): INR, PROTIME in the last 168 hours. Cardiac Enzymes: No results for input(s): CKTOTAL, CKMB, CKMBINDEX, TROPONINI in the last 168 hours. BNP (last 3 results) No results for input(s): PROBNP in the last 8760 hours. HbA1C: No results for input(s): HGBA1C in the last 72 hours. CBG: Recent Labs  Lab 04/24/20 0550  GLUCAP 109*   Lipid Profile: Recent Labs    04/29/20 1722  CHOL 105  HDL 14*  LDLCALC 53  TRIG 098*  CHOLHDL 7.5   Thyroid Function Tests: No results for input(s): TSH, T4TOTAL, FREET4,  T3FREE, THYROIDAB in the last 72 hours. Anemia Panel: No results for input(s): VITAMINB12, FOLATE, FERRITIN, TIBC, IRON, RETICCTPCT in the last 72 hours. Sepsis Labs: Recent Labs  Lab 04/29/20 1813 04/30/20 0134  LATICACIDVEN 0.7 0.8    Recent Results (from the past 240 hour(s))  SARS Coronavirus 2 by RT PCR (hospital order, performed in [facility] hospital lab) Nasopharyngeal Nasopharyngeal Swab     Status: None   Collection Time: 04/23/20  6:51 PM   Specimen: Nasopharyngeal Swab  Result Value Ref Range Status   SARS Coronavirus 2 NEGATIVE NEGATIVE Final    Comment: (NOTE) SARS-CoV-2 target nucleic acids are NOT DETECTED.  The SARS-CoV-2 RNA is generally detectable in upper and lower respiratory specimens during the acute phase of infection. The lowest concentration of SARS-CoV-2 viral copies this assay can detect is 250 copies / mL. A negative result does not preclude SARS-CoV-2 infection and should not be used as the sole basis for treatment or other patient management decisions.  A negative result may occur with improper specimen collection / handling, submission of specimen other than nasopharyngeal swab, presence of viral mutation(s) within the areas targeted by this assay, and inadequate number of viral copies (<250 copies / mL). A negative result must be combined with clinical observations, patient history, and epidemiological information.  Fact Sheet for Patients:   BoilerBrush.com.cy  Fact Sheet for Healthcare Providers: https://pope.com/  This test is not yet approved or  cleared by the [place] FDA and has been authorized for detection and/or diagnosis of SARS-CoV-2 by FDA under an Emergency Use Authorization (EUA).  This EUA will remain in effect (meaning this test can be used) for the duration of the COVID-19 declaration under Section 564(b)(1) of the Act, 21 U.S.C. section 360bbb-3(b)(1), unless the  authorization is terminated or revoked sooner.  Performed at [facility], [address], [place], [place] [address]   Blood culture (routine x 2)     Status: Abnormal   Collection Time: 04/23/20  7:30 PM   Specimen: BLOOD  Result Value Ref Range Status  Specimen Description BLOOD RIGHT ANTECUBITAL  Final   Special Requests   Final    BOTTLES DRAWN AEROBIC AND ANAEROBIC Blood Culture adequate volume   Culture  Setup Time   Final    AEROBIC BOTTLE ONLY GRAM POSITIVE COCCI IN CLUSTERS CRITICAL RESULT CALLED TO, READ BACK BY AND VERIFIED WITH: Tori Milks PHARMD 04/24/20 2317 JDW Performed at [facility], [address], [place], [place] [address]    Culture STAPHYLOCOCCUS AUREUS (A)  Final   Report Status 04/26/2020 FINAL  Final   Organism ID, Bacteria STAPHYLOCOCCUS AUREUS  Final      Susceptibility   Staphylococcus aureus - MIC*    CIPROFLOXACIN <=0.5 SENSITIVE Sensitive     ERYTHROMYCIN <=0.25 SENSITIVE Sensitive     GENTAMICIN <=0.5 SENSITIVE Sensitive     OXACILLIN <=0.25 SENSITIVE Sensitive     TETRACYCLINE <=1 SENSITIVE Sensitive     VANCOMYCIN <=0.5 SENSITIVE Sensitive     TRIMETH/SULFA <=10 SENSITIVE Sensitive     CLINDAMYCIN <=0.25 SENSITIVE Sensitive     RIFAMPIN <=0.5 SENSITIVE Sensitive     Inducible Clindamycin NEGATIVE Sensitive     * STAPHYLOCOCCUS AUREUS  Blood Culture ID Panel (Reflexed)     Status: Abnormal   Collection Time: 04/23/20  7:30 PM  Result Value Ref Range Status   Enterococcus species NOT DETECTED NOT DETECTED Final   Listeria monocytogenes NOT DETECTED NOT DETECTED Final   Staphylococcus species DETECTED (A) NOT DETECTED Final    Comment: CRITICAL RESULT CALLED TO, READ BACK BY AND VERIFIED WITH: L SEAY PHARMD 04/24/20 2317 JDW    Staphylococcus aureus (BCID) DETECTED (A) NOT DETECTED Final    Comment: Methicillin (oxacillin) susceptible Staphylococcus aureus (MSSA). Preferred therapy is anti staphylococcal beta lactam antibiotic  (Cefazolin or Nafcillin), unless clinically contraindicated. CRITICAL RESULT CALLED TO, READ BACK BY AND VERIFIED WITH: L SEAY PHARMD 04/24/20 2317 JDW    Methicillin resistance NOT DETECTED NOT DETECTED Final   Streptococcus species NOT DETECTED NOT DETECTED Final   Streptococcus agalactiae NOT DETECTED NOT DETECTED Final   Streptococcus pneumoniae NOT DETECTED NOT DETECTED Final   Streptococcus pyogenes NOT DETECTED NOT DETECTED Final   Acinetobacter baumannii NOT DETECTED NOT DETECTED Final   Enterobacteriaceae species NOT DETECTED NOT DETECTED Final   Enterobacter cloacae complex NOT DETECTED NOT DETECTED Final   Escherichia coli NOT DETECTED NOT DETECTED Final   Klebsiella oxytoca NOT DETECTED NOT DETECTED Final   Klebsiella pneumoniae NOT DETECTED NOT DETECTED Final   Proteus species NOT DETECTED NOT DETECTED Final   Serratia marcescens NOT DETECTED NOT DETECTED Final   Haemophilus influenzae NOT DETECTED NOT DETECTED Final   Neisseria meningitidis NOT DETECTED NOT DETECTED Final   Pseudomonas aeruginosa NOT DETECTED NOT DETECTED Final   Candida albicans NOT DETECTED NOT DETECTED Final   Candida glabrata NOT DETECTED NOT DETECTED Final   Candida krusei NOT DETECTED NOT DETECTED Final   Candida parapsilosis NOT DETECTED NOT DETECTED Final   Candida tropicalis NOT DETECTED NOT DETECTED Final    Comment: Performed at [facility], [address], [place], [place] [address]  Blood culture (routine x 2)     Status: None   Collection Time: 04/23/20  7:35 PM   Specimen: BLOOD RIGHT FOREARM  Result Value Ref Range Status   Specimen Description BLOOD RIGHT FOREARM  Final   Special Requests   Final    BOTTLES DRAWN AEROBIC AND ANAEROBIC Blood Culture results may not be optimal due to an inadequate volume  of blood received in culture bottles   Culture   Final    NO GROWTH 5 DAYS Performed at [facility], [address], [place], [place] [address]    Report Status  04/28/2020 FINAL  Final  Aerobic/Anaerobic Culture (surgical/deep wound)     Status: None   Collection Time: 04/23/20 10:37 PM   Specimen: Abscess  Result Value Ref Range Status   Specimen Description ABSCESS  Final   Special Requests THORACOULMBAR  Final   Gram Stain   Final    FEW WBC PRESENT, PREDOMINANTLY PMN FEW GRAM POSITIVE COCCI CRITICAL RESULT CALLED TO, READ BACK BY AND VERIFIED WITH: RN T ROWE  04/24/20 BY S GEZAHEGN    Culture   Final    FEW STAPHYLOCOCCUS AUREUS NO ANAEROBES ISOLATED Performed at [facility], [address] [place], [place] [address]    Report Status 04/29/2020 FINAL  Final   Organism ID, Bacteria STAPHYLOCOCCUS AUREUS  Final      Susceptibility   Staphylococcus aureus - MIC*    CIPROFLOXACIN <=0.5 SENSITIVE Sensitive     ERYTHROMYCIN <=0.25 SENSITIVE Sensitive     GENTAMICIN <=0.5 SENSITIVE Sensitive     OXACILLIN <=0.25 SENSITIVE Sensitive     TETRACYCLINE <=1 SENSITIVE Sensitive     VANCOMYCIN <=0.5 SENSITIVE Sensitive     TRIMETH/SULFA <=10 SENSITIVE Sensitive     CLINDAMYCIN <=0.25 SENSITIVE Sensitive     RIFAMPIN <=0.5 SENSITIVE Sensitive     Inducible Clindamycin NEGATIVE Sensitive     * FEW STAPHYLOCOCCUS AUREUS  Aerobic/Anaerobic Culture (surgical/deep wound)     Status: None   Collection Time: 04/23/20 10:37 PM   Specimen: Soft Tissue, Other  Result Value Ref Range Status   Specimen Description ABSCESS  Final   Special Requests EPIDURAL  Final   Gram Stain NO WBC SEEN NO ORGANISMS SEEN   Final   Culture   Final    FEW STAPHYLOCOCCUS AUREUS NO ANAEROBES ISOLATED Performed at [facility], [address], [place], [place] [address]    Report Status 04/29/2020 FINAL  Final   Organism ID, Bacteria STAPHYLOCOCCUS AUREUS  Final      Susceptibility   Staphylococcus aureus - MIC*    CIPROFLOXACIN <=0.5 SENSITIVE Sensitive     ERYTHROMYCIN <=0.25 SENSITIVE Sensitive     GENTAMICIN <=0.5 SENSITIVE Sensitive      OXACILLIN <=0.25 SENSITIVE Sensitive     TETRACYCLINE <=1 SENSITIVE Sensitive     VANCOMYCIN <=0.5 SENSITIVE Sensitive     TRIMETH/SULFA <=10 SENSITIVE Sensitive     CLINDAMYCIN <=0.25 SENSITIVE Sensitive     RIFAMPIN <=0.5 SENSITIVE Sensitive     Inducible Clindamycin NEGATIVE Sensitive     * FEW STAPHYLOCOCCUS AUREUS  MRSA PCR Screening     Status: None   Collection Time: 04/24/20  1:50 AM   Specimen: Nasal Mucosa; Nasopharyngeal  Result Value Ref Range Status   MRSA by PCR NEGATIVE NEGATIVE Final    Comment:        The GeneXpert MRSA Assay (FDA approved for NASAL specimens only), is one component of a comprehensive MRSA colonization surveillance program. It is not intended to diagnose MRSA infection nor to guide or monitor treatment for MRSA infections. Performed at [facility], [address] [place], [place] [address]   Culture, blood (routine x 2)     Status: None   Collection Time: 04/24/20  8:20 AM   Specimen: BLOOD LEFT ARM  Result Value Ref  Range Status   Specimen Description BLOOD LEFT ARM  Final   Special Requests   Final    BOTTLES DRAWN AEROBIC ONLY Blood Culture adequate volume   Culture   Final    NO GROWTH 5 DAYS Performed at [facility], [address] [place], [place] [address]    Report Status 04/29/2020 FINAL  Final  Culture, blood (routine x 2)     Status: None   Collection Time: 04/24/20  8:20 AM   Specimen: BLOOD LEFT ARM  Result Value Ref Range Status   Specimen Description BLOOD LEFT ARM  Final   Special Requests   Final    BOTTLES DRAWN AEROBIC ONLY Blood Culture results may not be optimal due to an inadequate volume of blood received in culture bottles   Culture   Final    NO GROWTH 5 DAYS Performed at [facility], [address] [place], [place] [address]    Report Status 04/29/2020 FINAL  Final  Culture, blood (routine x 2)     Status: None   Collection Time: 04/25/20  7:08 AM   Specimen: BLOOD  Result Value Ref  Range Status   Specimen Description BLOOD RIGHT ANTECUBITAL  Final   Special Requests   Final    BOTTLES DRAWN AEROBIC AND ANAEROBIC Blood Culture adequate volume   Culture   Final    NO GROWTH 5 DAYS Performed at [facility], [address] [place], [place] [address]    Report Status 04/30/2020 FINAL  Final  Culture, blood (routine x 2)     Status: None   Collection Time: 04/25/20  7:14 AM   Specimen: BLOOD  Result Value Ref Range Status   Specimen Description BLOOD LEFT ANTECUBITAL  Final   Special Requests   Final    BOTTLES DRAWN AEROBIC AND ANAEROBIC Blood Culture adequate volume   Culture   Final    NO GROWTH 5 DAYS Performed at [facility], [address] [place], [place] [address]    Report Status 04/30/2020 FINAL  Final  Surgical PCR screen     Status: None   Collection Time: 04/25/20  7:54 AM   Specimen: Nasal Mucosa; Nasal Swab  Result Value Ref Range Status   MRSA, PCR NEGATIVE NEGATIVE Final   Staphylococcus aureus NEGATIVE NEGATIVE Final    Comment: (NOTE) The Xpert SA Assay (FDA approved for NASAL specimens in patients 35 years of age and older), is one component of a comprehensive surveillance program. It is not intended to diagnose infection nor to guide or monitor treatment. Performed at [facility], [address], [place], [place] [address]   Aerobic/Anaerobic Culture (surgical/deep wound)     Status: None   Collection Time: 04/25/20  4:44 PM   Specimen: Wound  Result Value Ref Range Status   Specimen Description WOUND  Final   Special Requests RT ACROMIOCLAVICULAR JOINT  Final   Gram Stain   Final    FEW WBC PRESENT, PREDOMINANTLY PMN NO ORGANISMS SEEN    Culture   Final    RARE STAPHYLOCOCCUS AUREUS NO ANAEROBES ISOLATED Performed at [facility], [address], [place], [place] [address]    Report Status 04/30/2020 FINAL  Final   Organism ID, Bacteria STAPHYLOCOCCUS AUREUS  Final      Susceptibility    Staphylococcus aureus - MIC*    CIPROFLOXACIN <=0.5 SENSITIVE Sensitive     ERYTHROMYCIN <=0.25 SENSITIVE Sensitive  GENTAMICIN <=0.5 SENSITIVE Sensitive     OXACILLIN <=0.25 SENSITIVE Sensitive     TETRACYCLINE <=1 SENSITIVE Sensitive     VANCOMYCIN <=0.5 SENSITIVE Sensitive     TRIMETH/SULFA <=10 SENSITIVE Sensitive     CLINDAMYCIN <=0.25 SENSITIVE Sensitive     RIFAMPIN <=0.5 SENSITIVE Sensitive     Inducible Clindamycin NEGATIVE Sensitive     * RARE STAPHYLOCOCCUS AUREUS     Radiology Studies: CT ANGIO CHEST PE W OR WO CONTRAST  Result Date: 04/29/2020 CLINICAL DATA:  59 year old female with concern for pulmonary embolism. EXAM: CT ANGIOGRAPHY CHEST WITH CONTRAST TECHNIQUE: Multidetector CT imaging of the chest was performed using the standard protocol during bolus administration of intravenous contrast. Multiplanar CT image reconstructions and MIPs were obtained to evaluate the vascular anatomy. CONTRAST:  75mL OMNIPAQUE IOHEXOL 350 MG/ML SOLN COMPARISON:  Chest radiograph dated 04/29/2020. FINDINGS: Cardiovascular: There is no cardiomegaly or pericardial effusion. The thoracic aorta is unremarkable. The origins of the great vessels of the aortic arch appear patent as visualized. A central venous line noted with tip over central SVC close to the cavoatrial junction. No CT evidence of pulmonary embolism. Mediastinum/Nodes: There is no hilar or mediastinal adenopathy. Several small mediastinal lymph nodes. The esophagus and the thyroid gland are grossly unremarkable. No mediastinal fluid collection. Lungs/Pleura: Small right and trace left pleural effusions. There is associated partial compressive atelectasis of the lower lobes. There is no pneumothorax. The central airways are patent. Upper Abdomen: There is inflammatory changes of the pancreas consistent with acute pancreatitis. Correlation with clinical exam and pancreatic enzymes recommended. Scattered calcific foci throughout the pancreas  likely sequela of chronic inflammation. Probable background of fatty liver. Trace layering gallbladder sludge versus small stones. Musculoskeletal: Irregularity and destructive changes of the inferior L1 and superior L2 endplate and disc with associated prevertebral density most consistent with osteomyelitis/discitis. Clinical correlation is recommended. This is suboptimally evaluated and partially visualized on this CT and better evaluated on the recent MRI. Review of the MIP images confirms the above findings. IMPRESSION: 1. No CT evidence of pulmonary embolism. 2. Findings most consistent with acute pancreatitis. Correlation with clinical exam and pancreatic enzymes recommended. 3. Small right and trace left pleural effusions with associated partial compressive atelectasis of the lower lobes. 4. 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 Electronically Signed   By: Elgie Collard M.D.   On: 04/29/2020 16:31   DG CHEST PORT 1 VIEW  Result Date: 04/29/2020 CLINICAL DATA:  Tachypnea. EXAM: PORTABLE CHEST 1 VIEW COMPARISON:  None. FINDINGS: A left PICC line terminates near the caval atrial junction. No pneumothorax. The heart, hila, mediastinum, lungs, and pleura are otherwise normal. IMPRESSION: The left PICC line is in good position.  No acute abnormalities. Electronically Signed   By: Gerome Sam III M.D   On: 04/29/2020 11:19   US Abdomen Limited RUQ  Result Date: 04/30/2020 CLINICAL DATA:  Pancreatitis. EXAM: ULTRASOUND ABDOMEN LIMITED RIGHT UPPER QUADRANT COMPARISON:  None. FINDINGS: Gallbladder: Moderate gallbladder sludge and possible small nonshadowing stones. Negative sonographic Murphy sign. No significant gallbladder wall thickening. No adjacent free fluid. Common bile duct: Diameter: 9.3 mm-1.2 cm.  No evidence of choledocholithiasis. Liver: Mild increased parenchymal echogenicity likely due to a degree of steatosis without focal mass. Portal vein is patent on color Doppler imaging with normal  direction of blood flow towards the liver. Other: Trace perihepatic fluid. IMPRESSION: 1. Moderate gallbladder sludge and possible small nonshadowing stones. No additional sonographic evidence to suggest acute cholecystitis. 2. Dilated common  bile duct measuring up to 1.2 cm. No definite evidence of choledocholithiasis. MRCP or ERCP may be helpful for further evaluation. 3.  Mild hepatic steatosis. Electronically Signed   By: Marin Olp M.D.   On: 04/30/2020 12:35    Scheduled Meds: . amLODipine  5 mg Oral Daily  . Chlorhexidine Gluconate Cloth  6 each Topical Daily  . docusate sodium  100 mg Oral BID  . feeding supplement (ENSURE ENLIVE)  237 mL Oral BID BM  . lisinopril  10 mg Oral Daily  . nutrition supplement (JUVEN)  1 packet Oral BID BM  . oxyCODONE  15 mg Oral Q12H  . pantoprazole (PROTONIX) IV  40 mg Intravenous QHS  . sodium chloride flush  10-40 mL Intracatheter Q12H  . sodium chloride flush  3 mL Intravenous Q12H   Continuous Infusions: . sodium chloride    . sodium chloride    .  ceFAZolin (ANCEF) IV 2 g (04/30/20 0411)  . lactated ringers 125 mL/hr at 04/30/20 0408  . methocarbamol (ROBAXIN) IV 500 mg (04/28/20 0349)     LOS: 7 days   Marylu Lund, MD [facility] Pager On Amion  If 7PM-7AM, please contact night-coverage 04/30/2020, 4:28 PM

## 2020-05-01 LAB — COMPREHENSIVE METABOLIC PANEL
ALT: 15 U/L (ref 0–44)
AST: 50 U/L — ABNORMAL HIGH (ref 15–41)
Albumin: 1.7 g/dL — ABNORMAL LOW (ref 3.5–5.0)
Alkaline Phosphatase: 132 U/L — ABNORMAL HIGH (ref 38–126)
Anion gap: 9 (ref 5–15)
BUN: 6 mg/dL (ref 6–20)
CO2: 24 mmol/L (ref 22–32)
Calcium: 8.4 mg/dL — ABNORMAL LOW (ref 8.9–10.3)
Chloride: 103 mmol/L (ref 98–111)
Creatinine, Ser: 0.53 mg/dL (ref 0.44–1.00)
GFR calc Af Amer: >60 mL/min (ref >60)
GFR calc non Af Amer: >60 mL/min (ref >60)
Glucose, Bld: 128 mg/dL — ABNORMAL HIGH (ref 70–99)
Potassium: 3.6 mmol/L (ref 3.5–5.1)
Sodium: 136 mmol/L (ref 135–145)
Total Bilirubin: 0.7 mg/dL (ref 0.3–1.2)
Total Protein: 6.6 g/dL (ref 6.5–8.1)

## 2020-05-01 LAB — LIPASE, BLOOD: Lipase: 127 U/L — ABNORMAL HIGH (ref 11–51)

## 2020-05-01 LAB — MAGNESIUM: Magnesium: 1.8 mg/dL (ref 1.7–2.4)

## 2020-05-01 MED ORDER — POTASSIUM CHLORIDE CRYS ER 20 MEQ PO TBCR
40.0000 meq | EXTENDED_RELEASE_TABLET | Freq: Two times a day (BID) | ORAL | Status: AC
  Administered 2020-05-01 (×2): 40 meq via ORAL
  Filled 2020-05-01 (×2): qty 2

## 2020-05-01 MED ORDER — MAGNESIUM SULFATE 2 GM/50ML IV SOLN
2.0000 g | Freq: Once | INTRAVENOUS | Status: AC
  Administered 2020-05-01: 2 g via INTRAVENOUS
  Filled 2020-05-01: qty 50

## 2020-05-01 NOTE — Progress Notes (Signed)
Spoke with Dr. Matthias Hughs regarding this patient who has acute on chronic pancreatitis.  She underwent an MRCP which reveals gallstones but no CBD stones.  She does have pancreatic divisum, which is felt likely to be the source of her pancreatitis, but of course gallstones could not be rule out.  She is here with other complicating features as she was admitted after a fall that resulted in an epidural abscess requiring laminectomy for abscess evacuation and now with some cauda equina.  She was subsequently noted to have an abscess involving her shoulder joint space from when she fell as well, now with clavicular osteomyelitis.  She has been found to have MSSA bacteremia too.  We have been asked about lap chole for gallbladder removal to take stones out of the picture of her pancreatitis.  Given the numerous issues she currently has going on, we would recommend her pancreatitis resolve, along with her improvement in her overall medical condition.  She can follow up with [place] in our office as an outpatient to discuss elective lap chole.  Dr. Matthias Hughs felt this was very reasonable.  Debra Schmidt 10:43 AM 05/01/2020

## 2020-05-01 NOTE — Progress Notes (Signed)
Inpatient Rehabilitation-Admissions Coordinator   Pt not yet medically ready for CIR. Will continue to follow for readiness and bed availability. Will follow up tomorrow. Pt is aware.   Cheri Rous, OTR/L  Rehab Admissions Coordinator  [phone] 05/01/2020 11:44 AM

## 2020-05-01 NOTE — Progress Notes (Signed)
Patient ID: Debra Schmidt, female   DOB: July 24, 1961, 59 y.o.   MRN: [id number] BP (!) 123/57 (BP Location: Left Arm)    Pulse 83    Temp 97.9 F (36.6 C) (Oral)    Resp (!) 22    Ht 5\' 4"  (1.626 m)    Wt 62.6 kg    SpO2 100%    BMI 23.69 kg/m  Alert and oriented x4, speech is clear. Slightly confused at times Moving all extremities, some weakness in the lower extremities Being treated for pancreatitis, and the abscess(cefazolin). Slowly improving, CIR awaiting medical improvement before transfer

## 2020-05-01 NOTE — Progress Notes (Signed)
Physical Therapy Treatment Patient Details Name: Debra Schmidt MRN: [id number] DOB: 08-Nov-1961 Today's Date: 05/01/2020    History of Present Illness Pt is a 59 y.o. female admitted on 04/23/20 for emergent decompression of throacolumbar epidural abcess, discitis L1/2 s/p laminectomy L1/2 and evacuation of abcess.  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 She is now s/p I&D and shoulder, AC joint repair. Infectious disease is following suspicious that both R shoulder and back are staph infections.  Blood cultures are pending.  Pt with no significant medical history listed in chart.    PT Comments    Pt continues to report excruciating pain in her back greatly limiting mobility. Pt unable to sit without assist, stand or amb due to back pain. Acute PT to cont to follow to attempt to progress mobility.   Follow Up Recommendations  CIR     Equipment Recommendations  3in1 (PT);Wheelchair (measurements PT);Wheelchair cushion (measurements PT)    Recommendations for Other Services Rehab consult     Precautions / Restrictions Precautions Precautions: Fall;Back Precaution Comments: reviewed back precautions, pt able to recall 1/3 this session. PT provides further education and reinforcement Required Braces or Orthoses: Spinal Brace;Sling Spinal Brace: Lumbar corset Restrictions Weight Bearing Restrictions: No RUE Weight Bearing: Partial weight bearing RUE Partial Weight Bearing Percentage or Pounds: per ortho note, "nothing to strenuous with R UE" Other Position/Activity Restrictions: no overhead movement    Mobility  Bed Mobility Overal bed mobility: Needs Assistance Bed Mobility: Rolling;Sit to Sidelying Rolling: Min assist Sidelying to sit: Mod assist       General bed mobility comments: modA for trunk elevation, increased pain but tolerated it  Transfers Overall transfer level: Needs assistance Equipment used: Rolling walker (2  wheeled) Transfers: Sit to/from Stand Sit to Stand: Mod assist;+2 physical assistance         General transfer comment: modAx2 to power up without RW or use of R UE. Pt with increased pain and dependent on therapist and tech  Ambulation/Gait Ambulation/Gait assistance: Max assist;+2 physical assistance Gait Distance (Feet): 2 Feet Assistive device: 2 person hand held assist Gait Pattern/deviations: Step-to pattern Gait velocity: dec Gait velocity interpretation: <1.8 ft/sec, indicate of risk for recurrent falls General Gait Details: pt holding feet next to each other/narrow base of support, alot of difficulty advancing the R LE. pt unable to use RW becuase pt can't be placing too much weight on R UE   Stairs             Wheelchair Mobility    Modified Rankin (Stroke Patients Only)       Balance Overall balance assessment: Needs assistance Sitting-balance support: Bilateral upper extremity supported Sitting balance-Leahy Scale: Fair Sitting balance - Comments: encouraged pt not to support self with R UE however pt with no compliance   Standing balance support: Bilateral upper extremity supported Standing balance-Leahy Scale: Poor Standing balance comment: mod/maxA to maintain standing balance                            Cognition Arousal/Alertness: Awake/alert Behavior During Therapy: Flat affect Overall Cognitive Status: No family/caregiver present to determine baseline cognitive functioning Area of Impairment: Memory;Safety/judgement;Following commands;Awareness;Problem solving                   Current Attention Level: Sustained Memory: Decreased recall of precautions Following Commands: Follows one step commands consistently;Follows multi-step commands with increased time;Follows multi-step commands  inconsistently Safety/Judgement: Decreased awareness of safety;Decreased awareness of deficits Awareness: Emergent Problem Solving: Slow  processing;Requires verbal cues General Comments: Difficulty staying on task. Perseverating on pain.      Exercises      General Comments General comments (skin integrity, edema, etc.): pt VSS stable, R shoulder with dressing on it      Pertinent Vitals/Pain Pain Assessment: 0-10 Pain Score: 6  Pain Location: back and shoulder Pain Descriptors / Indicators: Grimacing;Guarding;Restless Pain Intervention(s): Limited activity within patient's tolerance    Home Living                      Prior Function            PT Goals (current goals can now be found in the care plan section) Progress towards PT goals: Not progressing toward goals - comment    Frequency    Min 5X/week      PT Plan Current plan remains appropriate    Co-evaluation              AM-PAC PT "6 Clicks" Mobility   Outcome Measure  Help needed turning from your back to your side while in a flat bed without using bedrails?: A Lot Help needed moving from lying on your back to sitting on the side of a flat bed without using bedrails?: A Lot Help needed moving to and from a bed to a chair (including a wheelchair)?: A Lot Help needed standing up from a chair using your arms (e.g., wheelchair or bedside chair)?: A Lot Help needed to walk in hospital room?: A Lot Help needed climbing 3-5 steps with a railing? : Total 6 Click Score: 11    End of Session Equipment Utilized During Treatment: Back brace Activity Tolerance: Patient limited by pain Patient left: in bed;in chair;with chair alarm set;with call bell/phone within reach Nurse Communication: Mobility status;Patient requests pain meds PT Visit Diagnosis: Muscle weakness (generalized) (M62.81);Difficulty in walking, not elsewhere classified (R26.2);Pain;Other symptoms and signs involving the nervous system (R29.898) Pain - Right/Left: Right Pain - part of body: Shoulder     Time: 9326-7124 PT Time Calculation (min) (ACUTE ONLY): 19  min  Charges:  $Gait Training: 8-22 mins                     Kittie Plater, PT, DPT Acute Rehabilitation Services Pager #: [phone] Office #: [phone]    Berline Lopes 05/01/2020, 2:42 PM

## 2020-05-01 NOTE — Progress Notes (Signed)
Lipase level minimally improved compared to yesterday.  Patient indicates she still has epigastric pain, but not as much as previously.  The upper abdomen and, interestingly the lower abdomen, are somewhat tender to light palpation.  The patient's MRI scan showed gallstones and slight biliary ductal dilatation, but no evidence of stones within the common bile duct (suboptimal study due to motion artifact), and interestingly, the patient also was found to have pancreas divisum, an anatomic variant which is a known because of chronic, recurrent pancreatitis.  Recommendations:  1.  I have asked general surgery to review the patient's case to address whether cholecystectomy is appropriate (please see their note on the chart).  2.  Monitor lipase, consider initiation of low-fat diet in a day or 2 if it continues to improve  Florencia Reasons, M.D. Pager [phone] If no answer or after 5 PM call [phone]

## 2020-05-01 NOTE — Progress Notes (Signed)
Patient ID: Debra Schmidt, female   DOB: 1961/01/21, 59 y.o.   MRN: [id number] Alert and oriented x 4 Moving lower extremities, weakness present Working with PT Wound is clean, and dry Continue abx

## 2020-05-01 NOTE — Progress Notes (Signed)
PROGRESS NOTE    Debra Schmidt  ZOX:[id number] DOB: November 06, 1961 DOA: 04/23/2020 PCP: Patient, No Pcp Per    Brief Narrative:  59 y.o.female with remote history of Xanax and hydrocodone use presented to the ED 6/13 , she states that she fell in the bathtub several weeks ago injuring her right shoulder, eventually developed a draining wound on the top of her shoulder. She began to have progressively more severe back pain left urinary incontinence leading to admission 6/13. MRI showed L1-2 discitis with thoracolumbar abscess. She underwent emergent surgery by Dr. 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 ID to f/u in clinic next month -Underwent L1/2 laminectomy, epidural abscess evacuation on 6/13 -Follow-up OR cultures, anticipate MSSA -Pt underwent I&D and distal clavicle resection 6/15 per ortho. Cont per ortho recs  Chronic hypokalemia and hypomagnesemia -Etiology not clear although albumin is <2. Suspect marked malnutrition prior to admit. Have consulted dietitian -Chart reviewed. Pt has hx of profound hypokalemia, first documented here on 02/24/20 with a K of 2.7 -She recalls being followed for hypokalemia when she was following up with her PCP over 5 years ago. Currently not seeing PCP -random cortisol level normal -Pt has required  multiple doses of potassium replacement -Potassium now improved after multiple doses of potassium and magnesium replacement -Continue to replace electrolytes aggressively  Acute on chronic anemia -The setting of severe infections and surgery -Cont to monitor for now  Hypertension -BP trends somewhat improved since starting lisinopril -have ordered PRN lopressor IV -Cont to titrate BP meds accordingly, stable currently  Tachycardia with tachypnea -Patient attributes to post-op pain. Patient reports marked pain, described as cramping, corresponding to times of documented tachycardia -CTA chest obtained and reviewed, neg for PE, however with incidental finding of acute on chronic pancreatitis, see below -VS have since improved with IVF hydration  Acute on chronic pancreatitis -Noted incidentally on CTA chest  -Clinically improving with aggressive IVF hydration -Lipid panel unremarkable -RUQ US ordered and reviewed. Finding of 1.2cm dilated CBD with GB sludge -GI consulted, appreciate recs -MRCP reviewed. Notable finding for no obstructing stones and pancreatic divisum -Appreciate input by General Surgery. Recommendation for outpt f/u to discuss possible elective lap chole after above acute issues have resolved  DVT prophylaxis: SCD's Code Status: Full Family Communication: Pt in room, family not at bedside  Consultants:   Orthopedic surgery  ID  Procedures:  PRE-OPERATIVE DIAGNOSIS: Epidural Mass  POST-OPERATIVE DIAGNOSIS: Epidural Abscess, L1/2 discitis  PROCEDURE: Procedure(s): Lumbar One-Two LAMINECTOMY for Epidural Abscess Evacuation  SURGEON:Surgeon(s):  Coletta Memosabbell, Kyle, MD   Antimicrobials: Anti-infectives (From admission, onward)   Start     Dose/Rate Route Frequency Ordered Stop   04/25/20 1641  vancomycin (VANCOCIN) powder  Status:  Discontinued          As needed 04/25/20 1641 04/25/20 1657   04/25/20 1200  ceFAZolin (ANCEF) IVPB 2g/100 mL premix  Discontinue     2 g 200 mL/hr over 30 Minutes Intravenous Every 8 hours 04/25/20 0930 06/03/20 2359   04/24/20 1200  vancomycin (VANCOREADY) IVPB 750 mg/150 mL  Status:  Discontinued        750 mg 150 mL/hr over 60 Minutes Intravenous Every 12 hours 04/24/20 0156 04/25/20 0930   04/24/20 0800  piperacillin-tazobactam (ZOSYN) IVPB 3.375 g  Status:  Discontinued        3.375 g 12.5 mL/hr over 240 Minutes Intravenous Every 8 hours 04/24/20 0156 04/24/20 1423   04/23/20 2215  piperacillin-tazobactam (ZOSYN) IVPB 3.375 g        3.375 g 100 mL/hr over 30 Minutes Intravenous  Once 04/23/20 2202 04/24/20 0031   04/23/20 2215  vancomycin (VANCOREADY) IVPB 1250 mg/250 mL        1,250 mg 166.7 mL/hr over 90 Minutes Intravenous  Once 04/23/20 2202 04/24/20 0101      Subjective: Reports feeling better. Eager to start therapy soon  Objective: Vitals:   05/01/20 0459 05/01/20 0758 05/01/20 1220 05/01/20 1300  BP:  124/82 122/86 120/90  Pulse:  92 99   Resp:  20 (!) 22 20  Temp: 97.9 F (36.6 C) 98.7 F (37.1 C) (!) 97.5 F (36.4 C)   TempSrc: Oral Oral    SpO2:  97%    Weight:      Height:        Intake/Output Summary (Last 24 hours) at 05/01/2020 1409 Last data filed at 05/01/2020 1300 Gross per 24 hour  Intake 4907.66 ml  Output 1100 ml  Net 3807.66 ml   Filed Weights   04/23/20 1332  Weight: 62.6 kg    Examination: General exam: Awake, laying in bed, in nad Respiratory system: Normal respiratory effort, no wheezing Cardiovascular system: regular rate, s1, s2 Gastrointestinal system: Soft, nondistended, epigastric tenderness, improved from yesterday Central nervous system: CN2-12 grossly intact, strength intact Extremities: Perfused, no clubbing Skin: Normal skin turgor, no notable skin lesions seen Psychiatry: Mood normal // no visual hallucinations    Data Reviewed: I have personally reviewed following labs and imaging studies  CBC: Recent Labs  Lab  04/25/20 0754 04/25/20 1402 04/26/20 0630  WBC 14.2*  --  16.2*  HGB 8.9* 9.2* 8.9*  HCT 27.6* 27.0* 27.5*  MCV 84.7  --  83.6  PLT 347  --  379   Basic Metabolic Panel: Recent Labs  Lab 04/25/20 0528 04/25/20 0754 04/27/20 0949 04/28/20 0522 04/29/20 0544 04/30/20 0134 05/01/20 0532  NA  --    < > 138 134* 136 136 136  K  --    < > 2.9* 3.2* 3.9 3.8 3.6  CL  --    < > 101 98 102 102 103  CO2  --    < > GLUCOSE  --    < > 112* 105* 109* 102* 128*  BUN  --    < > 5* <5* 5* <5* 6  CREATININE  --    < > 0.56 0.55 0.57 0.55 0.53  CALCIUM  --    < > 8.2* 8.0* 8.3* 8.2* 8.4*  MG  --   --  1.3* 1.8 1.8 1.6* 1.8  PHOS 2.3*  --   --   --   --   --   --    < > = values in this interval not displayed.   GFR: Estimated Creatinine Clearance: 65.4 mL/min (by C-G formula based on  SCr of 0.53 mg/dL). Liver Function Tests: Recent Labs  Lab 04/27/20 0949 04/28/20 0522 04/29/20 0544 04/30/20 0134 05/01/20 0532  AST 50*  ALT ALKPHOS 71 74 70 86 132*  BILITOT 0.6 0.3 0.6 0.7 0.7  PROT 6.4* 6.4* 6.2* 6.1* 6.6  ALBUMIN 1.8* 1.8* 1.7* 1.6* 1.7*   Recent Labs  Lab 04/29/20 1722 04/30/20 0134 05/01/20 0532  LIPASE 140* 137* 127*   No results for input(s): AMMONIA in the last 168 hours. Coagulation Profile: No results for input(s): INR, PROTIME in the last 168 hours. Cardiac Enzymes: No results for input(s): CKTOTAL, CKMB, CKMBINDEX, TROPONINI in the last 168 hours. BNP (last 3 results) No results for input(s): PROBNP in the last 8760 hours. HbA1C: No results for input(s): HGBA1C in the last 72 hours. CBG: No results for input(s): GLUCAP in the last 168 hours. Lipid Profile: Recent Labs    04/29/20 1722  CHOL 105  HDL 14*  LDLCALC 53  TRIG 409*  CHOLHDL 7.5   Thyroid Function Tests: No results for input(s): TSH, T4TOTAL, FREET4, T3FREE, THYROIDAB in the last 72 hours. Anemia Panel: No results for input(s): VITAMINB12,  FOLATE, FERRITIN, TIBC, IRON, RETICCTPCT in the last 72 hours. Sepsis Labs: Recent Labs  Lab 04/29/20 1813 04/30/20 0134  LATICACIDVEN 0.7 0.8    Recent Results (from the past 240 hour(s))  SARS Coronavirus 2 by RT PCR (hospital order, performed in [facility] hospital lab) Nasopharyngeal Nasopharyngeal Swab     Status: None   Collection Time: 04/23/20  6:51 PM   Specimen: Nasopharyngeal Swab  Result Value Ref Range Status   SARS Coronavirus 2 NEGATIVE NEGATIVE Final    Comment: (NOTE) SARS-CoV-2 target nucleic acids are NOT DETECTED.  The SARS-CoV-2 RNA is generally detectable in upper and lower respiratory specimens during the acute phase of infection. The lowest concentration of SARS-CoV-2 viral copies this assay can detect is 250 copies / mL. A negative result does not preclude SARS-CoV-2 infection and should not be used as the sole basis for treatment or other patient management decisions.  A negative result may occur with improper specimen collection / handling, submission of specimen other than nasopharyngeal swab, presence of viral mutation(s) within the areas targeted by this assay, and inadequate number of viral copies (<250 copies / mL). A negative result must be combined with clinical observations, patient history, and epidemiological information.  Fact Sheet for Patients:   BoilerBrush.com.cy  Fact Sheet for Healthcare Providers: https://pope.com/  This test is not yet approved or  cleared by the [place] FDA and has been authorized for detection and/or diagnosis of SARS-CoV-2 by FDA under an Emergency Use Authorization (EUA).  This EUA will remain in effect (meaning this test can be used) for the duration of the COVID-19 declaration under Section 564(b)(1) of the Act, 21 U.S.C. section 360bbb-3(b)(1), unless the authorization is terminated or revoked sooner.  Performed at [facility], [address], [place], [place] [address]   Blood culture (routine x 2)     Status: Abnormal   Collection Time: 04/23/20  7:30 PM   Specimen: BLOOD  Result Value Ref Range Status   Specimen Description BLOOD RIGHT ANTECUBITAL  Final   Special Requests   Final    BOTTLES DRAWN AEROBIC AND ANAEROBIC Blood Culture adequate volume   Culture  Setup Time   Final    AEROBIC BOTTLE ONLY GRAM POSITIVE COCCI IN CLUSTERS CRITICAL RESULT CALLED  TO, READ BACK BY AND VERIFIED WITH: Tori Milks [facility] 04/24/20 2317 JDW Performed at [facility], [address] [place], [place] [address]    Culture STAPHYLOCOCCUS AUREUS (A)  Final   Report Status 04/26/2020 FINAL  Final   Organism ID, Bacteria STAPHYLOCOCCUS AUREUS  Final      Susceptibility   Staphylococcus aureus - MIC*    CIPROFLOXACIN <=0.5 SENSITIVE Sensitive     ERYTHROMYCIN <=0.25 SENSITIVE Sensitive     GENTAMICIN <=0.5 SENSITIVE Sensitive     OXACILLIN <=0.25 SENSITIVE Sensitive     TETRACYCLINE <=1 SENSITIVE Sensitive     VANCOMYCIN <=0.5 SENSITIVE Sensitive     TRIMETH/SULFA <=10 SENSITIVE Sensitive     CLINDAMYCIN <=0.25 SENSITIVE Sensitive     RIFAMPIN <=0.5 SENSITIVE Sensitive     Inducible Clindamycin NEGATIVE Sensitive     * STAPHYLOCOCCUS AUREUS  Blood Culture ID Panel (Reflexed)     Status: Abnormal   Collection Time: 04/23/20  7:30 PM  Result Value Ref Range Status   Enterococcus species NOT DETECTED NOT DETECTED Final   Listeria monocytogenes NOT DETECTED NOT DETECTED Final   Staphylococcus species DETECTED (A) NOT DETECTED Final    Comment: CRITICAL RESULT CALLED TO, READ BACK BY AND VERIFIED WITH: L SEAY PHARMD 04/24/20 2317 JDW    Staphylococcus aureus (BCID) DETECTED (A) NOT DETECTED Final    Comment: Methicillin (oxacillin) susceptible Staphylococcus aureus (MSSA). Preferred therapy is anti staphylococcal beta lactam antibiotic (Cefazolin or Nafcillin), unless clinically contraindicated. CRITICAL RESULT CALLED TO, READ BACK BY AND  VERIFIED WITH: L SEAY PHARMD 04/24/20 2317 JDW    Methicillin resistance NOT DETECTED NOT DETECTED Final   Streptococcus species NOT DETECTED NOT DETECTED Final   Streptococcus agalactiae NOT DETECTED NOT DETECTED Final   Streptococcus pneumoniae NOT DETECTED NOT DETECTED Final   Streptococcus pyogenes NOT DETECTED NOT DETECTED Final   Acinetobacter baumannii NOT DETECTED NOT DETECTED Final   Enterobacteriaceae species NOT DETECTED NOT DETECTED Final   Enterobacter cloacae complex NOT DETECTED NOT DETECTED Final   Escherichia coli NOT DETECTED NOT DETECTED Final   Klebsiella oxytoca NOT DETECTED NOT DETECTED Final   Klebsiella pneumoniae NOT DETECTED NOT DETECTED Final   Proteus species NOT DETECTED NOT DETECTED Final   Serratia marcescens NOT DETECTED NOT DETECTED Final   Haemophilus influenzae NOT DETECTED NOT DETECTED Final   Neisseria meningitidis NOT DETECTED NOT DETECTED Final   Pseudomonas aeruginosa NOT DETECTED NOT DETECTED Final   Candida albicans NOT DETECTED NOT DETECTED Final   Candida glabrata NOT DETECTED NOT DETECTED Final   Candida krusei NOT DETECTED NOT DETECTED Final   Candida parapsilosis NOT DETECTED NOT DETECTED Final   Candida tropicalis NOT DETECTED NOT DETECTED Final    Comment: Performed at [facility], [address], [place], [place] [address]  Blood culture (routine x 2)     Status: None   Collection Time: 04/23/20  7:35 PM   Specimen: BLOOD RIGHT FOREARM  Result Value Ref Range Status   Specimen Description BLOOD RIGHT FOREARM  Final   Special Requests   Final    BOTTLES DRAWN AEROBIC AND ANAEROBIC Blood Culture results may not be optimal due to an inadequate volume of blood received in culture bottles   Culture   Final    NO GROWTH 5 DAYS Performed at [facility], [address], [place], [place] [address]    Report Status 04/28/2020 FINAL  Final  Aerobic/Anaerobic Culture (surgical/deep wound)  Status: None   Collection Time:  04/23/20 10:37 PM   Specimen: Abscess  Result Value Ref Range Status   Specimen Description ABSCESS  Final   Special Requests THORACOULMBAR  Final   Gram Stain   Final    FEW WBC PRESENT, PREDOMINANTLY PMN FEW GRAM POSITIVE COCCI CRITICAL RESULT CALLED TO, READ BACK BY AND VERIFIED WITH: RN T ROWE  04/24/20 BY S GEZAHEGN    Culture   Final    FEW STAPHYLOCOCCUS AUREUS NO ANAEROBES ISOLATED Performed at [facility], [address] [place], [place] [address]    Report Status 04/29/2020 FINAL  Final   Organism ID, Bacteria STAPHYLOCOCCUS AUREUS  Final      Susceptibility   Staphylococcus aureus - MIC*    CIPROFLOXACIN <=0.5 SENSITIVE Sensitive     ERYTHROMYCIN <=0.25 SENSITIVE Sensitive     GENTAMICIN <=0.5 SENSITIVE Sensitive     OXACILLIN <=0.25 SENSITIVE Sensitive     TETRACYCLINE <=1 SENSITIVE Sensitive     VANCOMYCIN <=0.5 SENSITIVE Sensitive     TRIMETH/SULFA <=10 SENSITIVE Sensitive     CLINDAMYCIN <=0.25 SENSITIVE Sensitive     RIFAMPIN <=0.5 SENSITIVE Sensitive     Inducible Clindamycin NEGATIVE Sensitive     * FEW STAPHYLOCOCCUS AUREUS  Aerobic/Anaerobic Culture (surgical/deep wound)     Status: None   Collection Time: 04/23/20 10:37 PM   Specimen: Soft Tissue, Other  Result Value Ref Range Status   Specimen Description ABSCESS  Final   Special Requests EPIDURAL  Final   Gram Stain NO WBC SEEN NO ORGANISMS SEEN   Final   Culture   Final    FEW STAPHYLOCOCCUS AUREUS NO ANAEROBES ISOLATED Performed at [facility], [address], [place], [place] [address]    Report Status 04/29/2020 FINAL  Final   Organism ID, Bacteria STAPHYLOCOCCUS AUREUS  Final      Susceptibility   Staphylococcus aureus - MIC*    CIPROFLOXACIN <=0.5 SENSITIVE Sensitive     ERYTHROMYCIN <=0.25 SENSITIVE Sensitive     GENTAMICIN <=0.5 SENSITIVE Sensitive     OXACILLIN <=0.25 SENSITIVE Sensitive     TETRACYCLINE <=1 SENSITIVE Sensitive     VANCOMYCIN <=0.5 SENSITIVE  Sensitive     TRIMETH/SULFA <=10 SENSITIVE Sensitive     CLINDAMYCIN <=0.25 SENSITIVE Sensitive     RIFAMPIN <=0.5 SENSITIVE Sensitive     Inducible Clindamycin NEGATIVE Sensitive     * FEW STAPHYLOCOCCUS AUREUS  MRSA PCR Screening     Status: None   Collection Time: 04/24/20  1:50 AM   Specimen: Nasal Mucosa; Nasopharyngeal  Result Value Ref Range Status   MRSA by PCR NEGATIVE NEGATIVE Final    Comment:        The GeneXpert MRSA Assay (FDA approved for NASAL specimens only), is one component of a comprehensive MRSA colonization surveillance program. It is not intended to diagnose MRSA infection nor to guide or monitor treatment for MRSA infections. Performed at [facility], [address], [place], [place] [address]   Culture, blood (routine x 2)     Status: None   Collection Time: 04/24/20  8:20 AM   Specimen: BLOOD LEFT ARM  Result Value Ref Range Status   Specimen Description BLOOD LEFT ARM  Final   Special Requests   Final    BOTTLES DRAWN AEROBIC ONLY Blood Culture adequate volume   Culture   Final    NO GROWTH 5 DAYS Performed at [facility], [address], [place], [place] [address]    Report Status 04/29/2020 FINAL  Final  Culture, blood (routine x 2)     Status: None   Collection Time: 04/24/20  8:20 AM   Specimen: BLOOD LEFT ARM  Result Value Ref Range Status   Specimen Description BLOOD LEFT ARM  Final   Special Requests   Final    BOTTLES DRAWN AEROBIC ONLY Blood Culture results may not be optimal due to an inadequate volume of blood received in culture bottles   Culture   Final    NO GROWTH 5 DAYS Performed at [facility], [place] [address] [place], [place] [address]    Report Status 04/29/2020 FINAL  Final  Culture, blood (routine x 2)     Status: None   Collection Time: 04/25/20  7:08 AM   Specimen: BLOOD  Result Value Ref Range Status   Specimen Description BLOOD RIGHT ANTECUBITAL  Final   Special Requests   Final    BOTTLES  DRAWN AEROBIC AND ANAEROBIC Blood Culture adequate volume   Culture   Final    NO GROWTH 5 DAYS Performed at [facility], [place] [address], [place], [place] [address]    Report Status 04/30/2020 FINAL  Final  Culture, blood (routine x 2)     Status: None   Collection Time: 04/25/20  7:14 AM   Specimen: BLOOD  Result Value Ref Range Status   Specimen Description BLOOD LEFT ANTECUBITAL  Final   Special Requests   Final    BOTTLES DRAWN AEROBIC AND ANAEROBIC Blood Culture adequate volume   Culture   Final    NO GROWTH 5 DAYS Performed at [facility], [place] [address] [place], [place] [address]    Report Status 04/30/2020 FINAL  Final  Surgical PCR screen     Status: None   Collection Time: 04/25/20  7:54 AM   Specimen: Nasal Mucosa; Nasal Swab  Result Value Ref Range Status   MRSA, PCR NEGATIVE NEGATIVE Final   Staphylococcus aureus NEGATIVE NEGATIVE Final    Comment: (NOTE) The Xpert SA Assay (FDA approved for NASAL specimens in patients 58 years of age and older), is one component of a comprehensive surveillance program. It is not intended to diagnose infection nor to guide or monitor treatment. Performed at [facility], [address], [place], [place] [address]   Aerobic/Anaerobic Culture (surgical/deep wound)     Status: None   Collection Time: 04/25/20  4:44 PM   Specimen: Wound  Result Value Ref Range Status   Specimen Description WOUND  Final   Special Requests RT ACROMIOCLAVICULAR JOINT  Final   Gram Stain   Final    FEW WBC PRESENT, PREDOMINANTLY PMN NO ORGANISMS SEEN    Culture   Final    RARE STAPHYLOCOCCUS AUREUS NO ANAEROBES ISOLATED Performed at [facility], [place] [address], [place], [place] [address]    Report Status 04/30/2020 FINAL  Final   Organism ID, Bacteria STAPHYLOCOCCUS AUREUS  Final      Susceptibility   Staphylococcus aureus - MIC*    CIPROFLOXACIN <=0.5 SENSITIVE Sensitive     ERYTHROMYCIN <=0.25 SENSITIVE Sensitive      GENTAMICIN <=0.5 SENSITIVE Sensitive     OXACILLIN <=0.25 SENSITIVE Sensitive     TETRACYCLINE <=1 SENSITIVE Sensitive     VANCOMYCIN <=0.5 SENSITIVE Sensitive     TRIMETH/SULFA <=10 SENSITIVE Sensitive     CLINDAMYCIN <=0.25 SENSITIVE Sensitive  RIFAMPIN <=0.5 SENSITIVE Sensitive     Inducible Clindamycin NEGATIVE Sensitive     * RARE STAPHYLOCOCCUS AUREUS     Radiology Studies: CT ANGIO CHEST PE W OR WO CONTRAST  Result Date: 04/29/2020 CLINICAL DATA:  59 year old female with concern for pulmonary embolism. EXAM: CT ANGIOGRAPHY CHEST WITH CONTRAST TECHNIQUE: Multidetector CT imaging of the chest was performed using the standard protocol during bolus administration of intravenous contrast. Multiplanar CT image reconstructions and MIPs were obtained to evaluate the vascular anatomy. CONTRAST:  52mL OMNIPAQUE IOHEXOL 350 MG/ML SOLN COMPARISON:  Chest radiograph dated 04/29/2020. FINDINGS: Cardiovascular: There is no cardiomegaly or pericardial effusion. The thoracic aorta is unremarkable. The origins of the great vessels of the aortic arch appear patent as visualized. A central venous line noted with tip over central SVC close to the cavoatrial junction. No CT evidence of pulmonary embolism. Mediastinum/Nodes: There is no hilar or mediastinal adenopathy. Several small mediastinal lymph nodes. The esophagus and the thyroid gland are grossly unremarkable. No mediastinal fluid collection. Lungs/Pleura: Small right and trace left pleural effusions. There is associated partial compressive atelectasis of the lower lobes. There is no pneumothorax. The central airways are patent. Upper Abdomen: There is inflammatory changes of the pancreas consistent with acute pancreatitis. Correlation with clinical exam and pancreatic enzymes recommended. Scattered calcific foci throughout the pancreas likely sequela of chronic inflammation. Probable background of fatty liver. Trace layering gallbladder sludge versus  small stones. Musculoskeletal: Irregularity and destructive changes of the inferior L1 and superior L2 endplate and disc with associated prevertebral density most consistent with osteomyelitis/discitis. Clinical correlation is recommended. This is suboptimally evaluated and partially visualized on this CT and better evaluated on the recent MRI. Review of the MIP images confirms the above findings. IMPRESSION: 1. No CT evidence of pulmonary embolism. 2. Findings most consistent with acute pancreatitis. Correlation with clinical exam and pancreatic enzymes recommended. 3. Small right and trace left pleural effusions with associated partial compressive atelectasis of the lower lobes. 4. 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 Electronically Signed   By: Elgie Collard M.D.   On: 04/29/2020 16:31   MR ABDOMEN MRCP WO CONTRAST  Result Date: 05/01/2020 CLINICAL DATA:  Abdominal pain, biliary obstruction suspected. EXAM: MRI ABDOMEN WITHOUT CONTRAST  (INCLUDING MRCP) TECHNIQUE: Multiplanar multisequence MR imaging of the abdomen was performed. Heavily T2-weighted images of the biliary and pancreatic ducts were obtained, and three-dimensional MRCP images were rendered by post processing. COMPARISON:  Multiple exams, including abdominal ultrasound 04/30/2020 CT abdomen 02/24/2020 FINDINGS: Despite efforts by the technologist and patient, motion artifact is present on today's exam and could not be eliminated. This reduces exam sensitivity and specificity. This is a common result when MRCP is attempted in the inpatient setting where patients are less likely to be able to breath hold and cooperate in controlling motion. Lower chest: Trace bilateral pleural effusions. Hepatobiliary: Dependent speckled low T2 signal intensity in the gallbladder for example on image 23/3 suspicious for small gallstones. No gallbladder wall thickening. The common hepatic duct measures up to 1 cm in diameter. Mildly low position of the cystic  duct attachment to the biliary tree, maximum common bile duct diameter about 0.7 cm, mildly prominent. I not observe a definite common bile duct or common hepatic duct filling defect or stone. Borderline intrahepatic biliary dilatation. No appreciable gallbladder wall thickening. Pancreas: Peripancreatic edema compatible with acute pancreatitis. 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 based on CT scan. Spleen:  Unremarkable Adrenals/Urinary Tract:  Unremarkable Stomach/Bowel: Unremarkable  Vascular/Lymphatic:  Unremarkable Other:  No supplemental non-categorized findings. Musculoskeletal: Abnormal paraspinal edema along the psoas muscles and posterior paraspinal musculature, epicenter at the L1-2 level where there appears to be discitis-osteomyelitis. Extending cephalad from this level, there is abnormal displacement of the spinal cord to the left and fluid signal intensity in the right spinal canal, suspicious for right eccentric epidural abscess. There also seems to be abnormal signal centrally in the thoracic spinal cord itself for example on image 4/3. IMPRESSION: 1. Acute-on-chronic pancreatitis. Pancreas divisum. 2. Mild biliary dilatation with the common hepatic duct measuring up to 1 cm in diameter. No definite choledocholithiasis. There is a low position of the cystic duct attachment to the biliary tree. 3. 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 These findings are roughly similar to those shown on MRI examinations of 1 week ago, although were previously better depicted due to the focused nature of the prior spine exams. 4. Trace bilateral pleural effusions. 5. Suspected small gallstones. 6. Despite efforts by the technologist and patient, motion artifact is present on today's exam and could not be eliminated. This reduces exam sensitivity and  specificity. This is a common result when patients are attempted in the setting where patients are less likely to be able to breath hold and cooperate in controlling motion. Electronically Signed   By: Gaylyn Rong M.D.   On: 05/01/2020 08:52   US Abdomen Limited RUQ  Result Date: 04/30/2020 CLINICAL DATA:  Pancreatitis. EXAM: ULTRASOUND ABDOMEN LIMITED RIGHT UPPER QUADRANT COMPARISON:  None. FINDINGS: Gallbladder: Moderate gallbladder sludge and possible small nonshadowing stones. Negative sonographic Murphy sign. No significant gallbladder wall thickening. No adjacent free fluid. Common bile duct: Diameter: 9.3 mm-1.2 cm.  No evidence of choledocholithiasis. Liver: Mild increased parenchymal echogenicity likely due to a degree of steatosis without focal mass. Portal vein is patent on color Doppler imaging with normal direction of blood flow towards the liver. Other: Trace perihepatic fluid. IMPRESSION: 1. Moderate gallbladder sludge and possible small nonshadowing stones. No additional sonographic evidence to suggest acute cholecystitis. 2. Dilated common bile duct measuring up to 1.2 cm. No definite evidence of choledocholithiasis. MRCP or ERCP may be helpful for further evaluation. 3.  Mild hepatic steatosis. Electronically Signed   By: Elberta Fortis M.D.   On: 04/30/2020 12:35    Scheduled Meds:  amLODipine  5 mg Oral Daily   Chlorhexidine Gluconate Cloth  6 each Topical Daily   docusate sodium  100 mg Oral BID   feeding supplement (ENSURE ENLIVE)  237 mL Oral BID BM   lisinopril  10 mg Oral Daily   nutrition supplement (JUVEN)  1 packet Oral BID BM   oxyCODONE  15 mg Oral Q12H   pantoprazole (PROTONIX) IV  40 mg Intravenous QHS   potassium chloride  40 mEq Oral BID   sodium chloride flush  10-40 mL Intracatheter Q12H   sodium chloride flush  3 mL Intravenous Q12H   Continuous Infusions:  sodium chloride     sodium chloride      ceFAZolin (ANCEF) IV 2 g (05/01/20 1324)    lactated ringers 100 mL/hr at 05/01/20 0758   methocarbamol (ROBAXIN) IV 500 mg (04/28/20 0349)     LOS: 8 days   Rickey Barbara, MD [facility] Pager On Amion  If 7PM-7AM, please contact night-coverage 05/01/2020, 2:09 PM

## 2020-05-02 LAB — COMPREHENSIVE METABOLIC PANEL
ALT: 13 U/L (ref 0–44)
AST: 36 U/L (ref 15–41)
Albumin: 1.8 g/dL — ABNORMAL LOW (ref 3.5–5.0)
Alkaline Phosphatase: 139 U/L — ABNORMAL HIGH (ref 38–126)
Anion gap: 11 (ref 5–15)
BUN: <5 mg/dL — ABNORMAL LOW (ref 6–20)
CO2: 22 mmol/L (ref 22–32)
Calcium: 8.7 mg/dL — ABNORMAL LOW (ref 8.9–10.3)
Chloride: 101 mmol/L (ref 98–111)
Creatinine, Ser: 0.55 mg/dL (ref 0.44–1.00)
GFR calc Af Amer: >60 mL/min (ref >60)
GFR calc non Af Amer: >60 mL/min (ref >60)
Glucose, Bld: 119 mg/dL — ABNORMAL HIGH (ref 70–99)
Potassium: 4.3 mmol/L (ref 3.5–5.1)
Sodium: 134 mmol/L — ABNORMAL LOW (ref 135–145)
Total Bilirubin: 1 mg/dL (ref 0.3–1.2)
Total Protein: 7 g/dL (ref 6.5–8.1)

## 2020-05-02 LAB — LIPASE, BLOOD: Lipase: 106 U/L — ABNORMAL HIGH (ref 11–51)

## 2020-05-02 LAB — MAGNESIUM: Magnesium: 1.8 mg/dL (ref 1.7–2.4)

## 2020-05-02 MED ORDER — MAGNESIUM SULFATE 2 GM/50ML IV SOLN
2.0000 g | Freq: Once | INTRAVENOUS | Status: AC
  Administered 2020-05-02: 2 g via INTRAVENOUS
  Filled 2020-05-02: qty 50

## 2020-05-02 MED ORDER — PANTOPRAZOLE SODIUM 40 MG PO TBEC
40.0000 mg | DELAYED_RELEASE_TABLET | Freq: Every day | ORAL | Status: DC
  Administered 2020-05-02 – 2020-05-08 (×7): 40 mg via ORAL
  Filled 2020-05-02 (×7): qty 1

## 2020-05-02 NOTE — Progress Notes (Signed)
PROGRESS NOTE    Debra Schmidt  CBJ:[id number] DOB: April 28, 1961 DOA: 04/23/2020 PCP: Patient, No Pcp Per    Brief Narrative:  59 y.o.female with remote history of Xanax and hydrocodone use presented to the ED 6/13 , she states that she fell in the bathtub several weeks ago injuring her right shoulder, eventually developed a draining wound on the top of her shoulder. She began to have progressively more severe back pain left urinary incontinence leading to admission 6/13. MRI showed L1-2 discitis with thoracolumbar abscess. She underwent emergent surgery by Dr. 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 ID to f/u in clinic next month -Underwent L1/2 laminectomy, epidural abscess evacuation on 6/13 -Follow-up OR cultures, anticipate MSSA -Pt underwent I&D and distal clavicle resection 6/15 per ortho. Will cont per ortho recs  Chronic hypokalemia and hypomagnesemia -Etiology not clear although albumin is <2. Suspect marked malnutrition in the setting of chronic pancreatitis prior to admit. Have consulted dietitian -Chart reviewed. Pt has hx of profound hypokalemia, first documented on 02/24/20 with a K of 2.7 -random cortisol level normal -Pt has required multiple doses of potassium replacement -Potassium now much improved after multiple doses  of potassium and magnesium replacement (have calculated 1,077mEq of potassium given since presentation) -Repeat bmet in AM  Acute on chronic anemia -The setting of severe infections and surgery -Cont to monitor for now  Hypertension -BP trends somewhat improved since starting lisinopril -have ordered PRN lopressor IV -Cont to titrate BP meds accordingly, stable presently   Tachycardia with tachypnea -Patient attributes to post-op pain. Patient reports marked pain, described as cramping, corresponding to times of documented tachycardia -CTA chest obtained and reviewed, neg for PE, however with incidental finding of acute on chronic pancreatitis, see below -VS have since improved with IVF hydration, see below  Acute on chronic pancreatitis -Noted incidentally on CTA chest  -Clinically improving with aggressive IVF hydration -Lipid panel unremarkable -RUQ [place] ordered and reviewed. Finding of 1.2cm dilated CBD with GB sludge -GI consulted, appreciate recs -MRCP reviewed. Notable finding for no obstructing stones and pancreatic divisum -Appreciate input by General Surgery. Recommendation for outpt f/u to discuss possible elective lap chole after above acute issues have resolved -Per GI, plan to advance to low fat diet as early as 6/23  DVT prophylaxis: SCD's Code Status: Full Family Communication: Pt in room, family not at bedside  Consultants:   Orthopedic surgery  ID  Procedures:  PRE-OPERATIVE DIAGNOSIS: Epidural Mass  POST-OPERATIVE DIAGNOSIS: Epidural Abscess, L1/2 discitis  PROCEDURE: Procedure(s): Lumbar One-Two LAMINECTOMY for Epidural Abscess Evacuation  SURGEON:Surgeon(s):  Coletta Memos, MD   Antimicrobials: Anti-infectives (From admission, onward)   Start     Dose/Rate Route Frequency Ordered Stop   04/25/20 1641  vancomycin (VANCOCIN) powder  Status:  Discontinued          As needed 04/25/20 1641 04/25/20 1657   04/25/20 1200  ceFAZolin (ANCEF)  IVPB 2g/100 mL premix     Discontinue     2 g 200 mL/hr over 30 Minutes Intravenous Every 8 hours 04/25/20 0930 06/03/20 2359   04/24/20 1200  vancomycin (VANCOREADY) IVPB 750 mg/150 mL  Status:  Discontinued        750 mg 150 mL/hr over 60 Minutes Intravenous Every 12 hours 04/24/20 0156 04/25/20 0930   04/24/20 0800  piperacillin-tazobactam (ZOSYN) IVPB 3.375 g  Status:  Discontinued        3.375 g 12.5 mL/hr over 240 Minutes Intravenous Every 8 hours 04/24/20 0156 04/24/20 1423   04/23/20 2215  piperacillin-tazobactam (ZOSYN) IVPB 3.375 g        3.375 g 100 mL/hr over 30 Minutes Intravenous  Once 04/23/20 2202 04/24/20 0031   04/23/20 2215  vancomycin (VANCOREADY) IVPB 1250 mg/250 mL        1,250 mg 166.7 mL/hr over 90 Minutes Intravenous  Once 04/23/20 2202 04/24/20 0101      Subjective: States feeling better. Abd less tender  Objective: Vitals:   05/02/20 0315 05/02/20 0842 05/02/20 1234 05/02/20 1438  BP: (!) 143/83 140/82 125/80 133/82  Pulse: (!) 104 (!) 103 (!) 103 98  Resp:  20 18 20   Temp: 98.2 F (36.8 C) (!) 97.1 F (36.2 C) 98.4 F (36.9 C) 97.7 F (36.5 C)  TempSrc: Oral  Oral Oral  SpO2:  97% 98% 94%  Weight:      Height:        Intake/Output Summary (Last 24 hours) at 05/02/2020 1616 Last data filed at 05/02/2020 1559 Gross per 24 hour  Intake 1363.25 ml  Output 4300 ml  Net -2936.75 ml   Filed Weights   04/23/20 1332  Weight: 62.6 kg    Examination: General exam: Conversant, in no acute distress Respiratory system: normal chest rise, clear, no audible wheezing Cardiovascular system: regular rhythm, s1-s2 Gastrointestinal system: Nondistended, mild epigastric tenderness, improved Central nervous system: No seizures, no tremors Extremities: No cyanosis, no joint deformities Skin: No rashes, no pallor Psychiatry: Affect normal // no auditory hallucinations   Data Reviewed: I have personally reviewed following labs and imaging  studies  CBC: Recent Labs  Lab 04/26/20 0630  WBC 16.2*  HGB 8.9*  HCT 27.5*  MCV 83.6  PLT 099   Basic Metabolic Panel: Recent Labs  Lab 04/28/20 0522 04/29/20 0544 04/30/20 0134 05/01/20 0532 05/02/20 0500  NA 134* 136 136 136 134*  K 3.2* 3.9 3.8 3.6 4.3  CL 98 102 102 103 101  CO2 25 22 25 24 22   GLUCOSE 105* 109* 102* 128* 119*  BUN <5* 5* <5* 6 <5*  CREATININE 0.55 0.57 0.55 0.53 0.55  CALCIUM 8.0* 8.3* 8.2* 8.4* 8.7*  MG 1.8 1.8 1.6* 1.8 1.8   GFR: Estimated Creatinine Clearance: 65.4 mL/min (by C-G formula based on SCr of 0.55 mg/dL). Liver Function Tests: Recent Labs  Lab 04/28/20 0522 04/29/20 0544 04/30/20 0134 05/01/20 0532 05/02/20 0500  AST 17 18 29  50* 36  ALT 8 8 10 15 13   ALKPHOS 74 70 86 132* 139*  BILITOT 0.3 0.6 0.7 0.7 1.0  PROT 6.4* 6.2* 6.1* 6.6 7.0  ALBUMIN 1.8* 1.7* 1.6* 1.7* 1.8*   Recent Labs  Lab 04/29/20 1722 04/30/20 0134 05/01/20 0532 05/02/20 0500  LIPASE 140* 137* 127* 106*   No results for input(s): AMMONIA in the last 168 hours. Coagulation Profile: No results for input(s): INR, PROTIME in the last 168 hours. Cardiac  Enzymes: No results for input(s): CKTOTAL, CKMB, CKMBINDEX, TROPONINI in the last 168 hours. BNP (last 3 results) No results for input(s): PROBNP in the last 8760 hours. HbA1C: No results for input(s): HGBA1C in the last 72 hours. CBG: No results for input(s): GLUCAP in the last 168 hours. Lipid Profile: Recent Labs    04/29/20 1722  CHOL 105  HDL 14*  LDLCALC 53  TRIG 409*  CHOLHDL 7.5   Thyroid Function Tests: No results for input(s): TSH, T4TOTAL, FREET4, T3FREE, THYROIDAB in the last 72 hours. Anemia Panel: No results for input(s): VITAMINB12, FOLATE, FERRITIN, TIBC, IRON, RETICCTPCT in the last 72 hours. Sepsis Labs: Recent Labs  Lab 04/29/20 1813 04/30/20 0134  LATICACIDVEN 0.7 0.8    Recent Results (from the past 240 hour(s))  SARS Coronavirus 2 by RT PCR (hospital order,  performed in [facility] hospital lab) Nasopharyngeal Nasopharyngeal Swab     Status: None   Collection Time: 04/23/20  6:51 PM   Specimen: Nasopharyngeal Swab  Result Value Ref Range Status   SARS Coronavirus 2 NEGATIVE NEGATIVE Final    Comment: (NOTE) SARS-CoV-2 target nucleic acids are NOT DETECTED.  The SARS-CoV-2 RNA is generally detectable in upper and lower respiratory specimens during the acute phase of infection. The lowest concentration of SARS-CoV-2 viral copies this assay can detect is 250 copies / mL. A negative result does not preclude SARS-CoV-2 infection and should not be used as the sole basis for treatment or other patient management decisions.  A negative result may occur with improper specimen collection / handling, submission of specimen other than nasopharyngeal swab, presence of viral mutation(s) within the areas targeted by this assay, and inadequate number of viral copies (<250 copies / mL). A negative result must be combined with clinical observations, patient history, and epidemiological information.  Fact Sheet for Patients:   BoilerBrush.com.cy  Fact Sheet for Healthcare Providers: https://pope.com/  This test is not yet approved or  cleared by the [place] FDA and has been authorized for detection and/or diagnosis of SARS-CoV-2 by FDA under an Emergency Use Authorization (EUA).  This EUA will remain in effect (meaning this test can be used) for the duration of the COVID-19 declaration under Section 564(b)(1) of the Act, 21 U.S.C. section 360bbb-3(b)(1), unless the authorization is terminated or revoked sooner.  Performed at [facility], [address], [place], [place] [address]   Blood culture (routine x 2)     Status: Abnormal   Collection Time: 04/23/20  7:30 PM   Specimen: BLOOD  Result Value Ref Range Status   Specimen Description BLOOD RIGHT ANTECUBITAL  Final   Special Requests    Final    BOTTLES DRAWN AEROBIC AND ANAEROBIC Blood Culture adequate volume   Culture  Setup Time   Final    AEROBIC BOTTLE ONLY GRAM POSITIVE COCCI IN CLUSTERS CRITICAL RESULT CALLED TO, READ BACK BY AND VERIFIED WITH: Tori Milks [facility] 04/24/20 2317 JDW Performed at [facility] Lab, [address], [place], [place] [address]    Culture STAPHYLOCOCCUS AUREUS (A)  Final   Report Status 04/26/2020 FINAL  Final   Organism ID, Bacteria STAPHYLOCOCCUS AUREUS  Final      Susceptibility   Staphylococcus aureus - MIC*    CIPROFLOXACIN <=0.5 SENSITIVE Sensitive     ERYTHROMYCIN <=0.25 SENSITIVE Sensitive     GENTAMICIN <=0.5 SENSITIVE Sensitive     OXACILLIN <=0.25 SENSITIVE Sensitive     TETRACYCLINE <=1 SENSITIVE Sensitive  VANCOMYCIN <=0.5 SENSITIVE Sensitive     TRIMETH/SULFA <=10 SENSITIVE Sensitive     CLINDAMYCIN <=0.25 SENSITIVE Sensitive     RIFAMPIN <=0.5 SENSITIVE Sensitive     Inducible Clindamycin NEGATIVE Sensitive     * STAPHYLOCOCCUS AUREUS  Blood Culture ID Panel (Reflexed)     Status: Abnormal   Collection Time: 04/23/20  7:30 PM  Result Value Ref Range Status   Enterococcus species NOT DETECTED NOT DETECTED Final   Listeria monocytogenes NOT DETECTED NOT DETECTED Final   Staphylococcus species DETECTED (A) NOT DETECTED Final    Comment: CRITICAL RESULT CALLED TO, READ BACK BY AND VERIFIED WITH: L SEAY PHARMD 04/24/20 2317 JDW    Staphylococcus aureus (BCID) DETECTED (A) NOT DETECTED Final    Comment: Methicillin (oxacillin) susceptible Staphylococcus aureus (MSSA). Preferred therapy is anti staphylococcal beta lactam antibiotic (Cefazolin or Nafcillin), unless clinically contraindicated. CRITICAL RESULT CALLED TO, READ BACK BY AND VERIFIED WITH: L SEAY PHARMD 04/24/20 2317 JDW    Methicillin resistance NOT DETECTED NOT DETECTED Final   Streptococcus species NOT DETECTED NOT DETECTED Final   Streptococcus agalactiae NOT DETECTED NOT DETECTED Final   Streptococcus  pneumoniae NOT DETECTED NOT DETECTED Final   Streptococcus pyogenes NOT DETECTED NOT DETECTED Final   Acinetobacter baumannii NOT DETECTED NOT DETECTED Final   Enterobacteriaceae species NOT DETECTED NOT DETECTED Final   Enterobacter cloacae complex NOT DETECTED NOT DETECTED Final   Escherichia coli NOT DETECTED NOT DETECTED Final   Klebsiella oxytoca NOT DETECTED NOT DETECTED Final   Klebsiella pneumoniae NOT DETECTED NOT DETECTED Final   Proteus species NOT DETECTED NOT DETECTED Final   Serratia marcescens NOT DETECTED NOT DETECTED Final   Haemophilus influenzae NOT DETECTED NOT DETECTED Final   Neisseria meningitidis NOT DETECTED NOT DETECTED Final   Pseudomonas aeruginosa NOT DETECTED NOT DETECTED Final   Candida albicans NOT DETECTED NOT DETECTED Final   Candida glabrata NOT DETECTED NOT DETECTED Final   Candida krusei NOT DETECTED NOT DETECTED Final   Candida parapsilosis NOT DETECTED NOT DETECTED Final   Candida tropicalis NOT DETECTED NOT DETECTED Final    Comment: Performed at [facility], [address], [place], [place] [address]  Blood culture (routine x 2)     Status: None   Collection Time: 04/23/20  7:35 PM   Specimen: BLOOD RIGHT FOREARM  Result Value Ref Range Status   Specimen Description BLOOD RIGHT FOREARM  Final   Special Requests   Final    BOTTLES DRAWN AEROBIC AND ANAEROBIC Blood Culture results may not be optimal due to an inadequate volume of blood received in culture bottles   Culture   Final    NO GROWTH 5 DAYS Performed at [facility], [address], [place], [place] [address]    Report Status 04/28/2020 FINAL  Final  Aerobic/Anaerobic Culture (surgical/deep wound)     Status: None   Collection Time: 04/23/20 10:37 PM   Specimen: Abscess  Result Value Ref Range Status   Specimen Description ABSCESS  Final   Special Requests THORACOULMBAR  Final   Gram Stain   Final    FEW WBC PRESENT, PREDOMINANTLY PMN FEW GRAM POSITIVE  COCCI CRITICAL RESULT CALLED TO, READ BACK BY AND VERIFIED WITH: RN T ROWE @0107  04/24/20 BY S GEZAHEGN    Culture   Final    FEW STAPHYLOCOCCUS AUREUS NO ANAEROBES ISOLATED Performed at [facility] Lab, [address], [place], [place]    Report Status 04/29/2020  FINAL  Final   Organism ID, Bacteria STAPHYLOCOCCUS AUREUS  Final      Susceptibility   Staphylococcus aureus - MIC*    CIPROFLOXACIN <=0.5 SENSITIVE Sensitive     ERYTHROMYCIN <=0.25 SENSITIVE Sensitive     GENTAMICIN <=0.5 SENSITIVE Sensitive     OXACILLIN <=0.25 SENSITIVE Sensitive     TETRACYCLINE <=1 SENSITIVE Sensitive     VANCOMYCIN <=0.5 SENSITIVE Sensitive     TRIMETH/SULFA <=10 SENSITIVE Sensitive     CLINDAMYCIN <=0.25 SENSITIVE Sensitive     RIFAMPIN <=0.5 SENSITIVE Sensitive     Inducible Clindamycin NEGATIVE Sensitive     * FEW STAPHYLOCOCCUS AUREUS  Aerobic/Anaerobic Culture (surgical/deep wound)     Status: None   Collection Time: 04/23/20 10:37 PM   Specimen: Soft Tissue, Other  Result Value Ref Range Status   Specimen Description ABSCESS  Final   Special Requests EPIDURAL  Final   Gram Stain NO WBC SEEN NO ORGANISMS SEEN   Final   Culture   Final    FEW STAPHYLOCOCCUS AUREUS NO ANAEROBES ISOLATED Performed at [facility], [address] [place], [place] [address]    Report Status 04/29/2020 FINAL  Final   Organism ID, Bacteria STAPHYLOCOCCUS AUREUS  Final      Susceptibility   Staphylococcus aureus - MIC*    CIPROFLOXACIN <=0.5 SENSITIVE Sensitive     ERYTHROMYCIN <=0.25 SENSITIVE Sensitive     GENTAMICIN <=0.5 SENSITIVE Sensitive     OXACILLIN <=0.25 SENSITIVE Sensitive     TETRACYCLINE <=1 SENSITIVE Sensitive     VANCOMYCIN <=0.5 SENSITIVE Sensitive     TRIMETH/SULFA <=10 SENSITIVE Sensitive     CLINDAMYCIN <=0.25 SENSITIVE Sensitive     RIFAMPIN <=0.5 SENSITIVE Sensitive     Inducible Clindamycin NEGATIVE Sensitive     * FEW STAPHYLOCOCCUS AUREUS  MRSA PCR  Screening     Status: None   Collection Time: 04/24/20  1:50 AM   Specimen: Nasal Mucosa; Nasopharyngeal  Result Value Ref Range Status   MRSA by PCR NEGATIVE NEGATIVE Final    Comment:        The GeneXpert MRSA Assay (FDA approved for NASAL specimens only), is one component of a comprehensive MRSA colonization surveillance program. It is not intended to diagnose MRSA infection nor to guide or monitor treatment for MRSA infections. Performed at [facility], [address] [place], [place] [address]   Culture, blood (routine x 2)     Status: None   Collection Time: 04/24/20  8:20 AM   Specimen: BLOOD LEFT ARM  Result Value Ref Range Status   Specimen Description BLOOD LEFT ARM  Final   Special Requests   Final    BOTTLES DRAWN AEROBIC ONLY Blood Culture adequate volume   Culture   Final    NO GROWTH 5 DAYS Performed at [facility], [address] [place], [place] [address]    Report Status 04/29/2020 FINAL  Final  Culture, blood (routine x 2)     Status: None   Collection Time: 04/24/20  8:20 AM   Specimen: BLOOD LEFT ARM  Result Value Ref Range Status   Specimen Description BLOOD LEFT ARM  Final   Special Requests   Final    BOTTLES DRAWN AEROBIC ONLY Blood Culture results may not be optimal due to an inadequate volume of blood received in culture bottles   Culture   Final    NO GROWTH 5 DAYS Performed at [facility],  [address] [place], [place] [address]    Report Status 04/29/2020 FINAL  Final  Culture, blood (routine x 2)     Status: None   Collection Time: 04/25/20  7:08 AM   Specimen: BLOOD  Result Value Ref Range Status   Specimen Description BLOOD RIGHT ANTECUBITAL  Final   Special Requests   Final    BOTTLES DRAWN AEROBIC AND ANAEROBIC Blood Culture adequate volume   Culture   Final    NO GROWTH 5 DAYS Performed at [facility], [address], [place], [place] [address]    Report Status 04/30/2020 FINAL  Final  Culture,  blood (routine x 2)     Status: None   Collection Time: 04/25/20  7:14 AM   Specimen: BLOOD  Result Value Ref Range Status   Specimen Description BLOOD LEFT ANTECUBITAL  Final   Special Requests   Final    BOTTLES DRAWN AEROBIC AND ANAEROBIC Blood Culture adequate volume   Culture   Final    NO GROWTH 5 DAYS Performed at [facility], [address] [place], [place] [address]    Report Status 04/30/2020 FINAL  Final  Surgical PCR screen     Status: None   Collection Time: 04/25/20  7:54 AM   Specimen: Nasal Mucosa; Nasal Swab  Result Value Ref Range Status   MRSA, PCR NEGATIVE NEGATIVE Final   Staphylococcus aureus NEGATIVE NEGATIVE Final    Comment: (NOTE) The Xpert SA Assay (FDA approved for NASAL specimens in patients 49 years of age and older), is one component of a comprehensive surveillance program. It is not intended to diagnose infection nor to guide or monitor treatment. Performed at [facility], [address] [place], [place] [address]   Aerobic/Anaerobic Culture (surgical/deep wound)     Status: None   Collection Time: 04/25/20  4:44 PM   Specimen: Wound  Result Value Ref Range Status   Specimen Description WOUND  Final   Special Requests RT ACROMIOCLAVICULAR JOINT  Final   Gram Stain   Final    FEW WBC PRESENT, PREDOMINANTLY PMN NO ORGANISMS SEEN    Culture   Final    RARE STAPHYLOCOCCUS AUREUS NO ANAEROBES ISOLATED Performed at [facility], [address] [place], [place] [address]    Report Status 04/30/2020 FINAL  Final   Organism ID, Bacteria STAPHYLOCOCCUS AUREUS  Final      Susceptibility   Staphylococcus aureus - MIC*    CIPROFLOXACIN <=0.5 SENSITIVE Sensitive     ERYTHROMYCIN <=0.25 SENSITIVE Sensitive     GENTAMICIN <=0.5 SENSITIVE Sensitive     OXACILLIN <=0.25 SENSITIVE Sensitive     TETRACYCLINE <=1 SENSITIVE Sensitive     VANCOMYCIN <=0.5 SENSITIVE Sensitive     TRIMETH/SULFA <=10 SENSITIVE Sensitive     CLINDAMYCIN  <=0.25 SENSITIVE Sensitive     RIFAMPIN <=0.5 SENSITIVE Sensitive     Inducible Clindamycin NEGATIVE Sensitive     * RARE STAPHYLOCOCCUS AUREUS     Radiology Studies: MR ABDOMEN MRCP WO CONTRAST  Result Date: 05/01/2020 CLINICAL DATA:  Abdominal pain, biliary obstruction suspected. EXAM: MRI ABDOMEN WITHOUT CONTRAST  (INCLUDING MRCP) TECHNIQUE: Multiplanar multisequence MR imaging of the abdomen was performed. Heavily T2-weighted images of the biliary and pancreatic ducts were obtained, and three-dimensional MRCP images were rendered by post processing. COMPARISON:  Multiple exams, including abdominal ultrasound 04/30/2020 CT abdomen 02/24/2020 FINDINGS: Despite efforts by the technologist and patient, motion artifact is present on  today's exam and could not be eliminated. This reduces exam sensitivity and specificity. This is a common result when MRCP is attempted in the inpatient setting where patients are less likely to be able to breath hold and cooperate in controlling motion. Lower chest: Trace bilateral pleural effusions. Hepatobiliary: Dependent speckled low T2 signal intensity in the gallbladder for example on image 23/3 suspicious for small gallstones. No gallbladder wall thickening. The common hepatic duct measures up to 1 cm in diameter. Mildly low position of the cystic duct attachment to the biliary tree, maximum common bile duct diameter about 0.7 cm, mildly prominent. I not observe a definite common bile duct or common hepatic duct filling defect or stone. Borderline intrahepatic biliary dilatation. No appreciable gallbladder wall thickening. Pancreas: Peripancreatic edema compatible with acute pancreatitis. 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 based on CT scan. Spleen:  Unremarkable Adrenals/Urinary Tract:  Unremarkable Stomach/Bowel: Unremarkable Vascular/Lymphatic:  Unremarkable Other:  No supplemental non-categorized findings. Musculoskeletal: Abnormal  paraspinal edema along the psoas muscles and posterior paraspinal musculature, epicenter at the L1-2 level where there appears to be discitis-osteomyelitis. Extending cephalad from this level, there is abnormal displacement of the spinal cord to the left and fluid signal intensity in the right spinal canal, suspicious for right eccentric epidural abscess. There also seems to be abnormal signal centrally in the thoracic spinal cord itself for example on image 4/3. IMPRESSION: 1. Acute-on-chronic pancreatitis. Pancreas divisum. 2. Mild biliary dilatation with the common hepatic duct measuring up to 1 cm in diameter. No definite choledocholithiasis. There is a low position of the cystic duct attachment to the biliary tree. 3. 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 These findings are roughly similar to those shown on MRI examinations of 1 week ago, although were previously better depicted due to the focused nature of the prior spine exams. 4. Trace bilateral pleural effusions. 5. Suspected small gallstones. 6. Despite efforts by the technologist and patient, motion artifact is present on today's exam and could not be eliminated. This reduces exam sensitivity and specificity. This is a common result when patients are attempted in the setting where patients are less likely to be able to breath hold and cooperate in controlling motion. Electronically Signed   By: Gaylyn Rong M.D.   On: 05/01/2020 08:52    Scheduled Meds: . amLODipine  5 mg Oral Daily  . Chlorhexidine Gluconate Cloth  6 each Topical Daily  . docusate sodium  100 mg Oral BID  . feeding supplement (ENSURE ENLIVE)  237 mL Oral BID BM  . lisinopril  10 mg Oral Daily  . nutrition supplement (JUVEN)  1 packet Oral BID BM  . oxyCODONE  15 mg Oral Q12H  . pantoprazole  40 mg Oral QHS  . sodium chloride flush   10-40 mL Intracatheter Q12H  . sodium chloride flush  3 mL Intravenous Q12H   Continuous Infusions: . sodium chloride    . sodium chloride    .  ceFAZolin (ANCEF) IV Stopped (05/02/20 1347)  . lactated ringers Stopped (05/02/20 0502)  . methocarbamol (ROBAXIN) IV 500 mg (04/28/20 0349)     LOS: 9 days   Rickey Barbara, MD [facility] Pager On Amion  If 7PM-7AM, please contact night-coverage 05/02/2020, 4:16 PM

## 2020-05-02 NOTE — H&P (Signed)
She can be WBAT for her right upper extremity   Tim Jamesyn [place]

## 2020-05-02 NOTE — Progress Notes (Signed)
Occupational Therapy Treatment Patient Details Name: Debra Schmidt MRN: [id number] DOB: November 09, 1961 Today's Date: 05/02/2020    History of present illness Pt is a 59 y.o. female admitted on 04/23/20 for emergent decompression of throacolumbar epidural abcess, discitis L1/2 s/p laminectomy L1/2 and evacuation of abcess.  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 She is now s/p I&D and shoulder, AC joint repair. Infectious disease is following suspicious that both R shoulder and back are staph infections.  Blood cultures are pending.  Pt with no significant medical history listed in chart.   OT comments  Pt seen in conjunction with PT to maximize participation and activity tolerance. Pt continues to present with pain, WB restrictions, generalized weakness, and decreased activity tolerance impacting pts ability to engage in BADLs. Pt with no carryover of back precautions from previous session, provided extensive education on back precautions. Pt required MOD A for bed mobility and MAX A +2 for short distance functional mobility. Pt continues to try to fully WB on RUE despite max education. Pt currently MAX A for UB ADLs and total A for LB ADLs. Agree with DC plan below, will follow acutely per POC.   Follow Up Recommendations  CIR    Equipment Recommendations  3 in 1 bedside commode    Recommendations for Other Services      Precautions / Restrictions Precautions Precautions: Fall;Back Precaution Booklet Issued: No Precaution Comments: reviewed back precautions extensively during session w Required Braces or Orthoses: Spinal Brace;Sling Spinal Brace: Lumbar corset Restrictions Weight Bearing Restrictions: No RUE Weight Bearing: Partial weight bearing RUE Partial Weight Bearing Percentage or Pounds: per ortho note, "nothing to strenuous with R UE" Other Position/Activity Restrictions: no overhead movement       Mobility Bed Mobility Overal bed  mobility: Needs Assistance Bed Mobility: Rolling;Sit to Sidelying Rolling: Min assist Sidelying to sit: Mod assist;+2 for physical assistance       General bed mobility comments: pt required step by step cues to sequence bed mobility via log roll technique; MOD A +2 to elevate trunk and scoot hips to EOB  Transfers Overall transfer level: Needs assistance Equipment used: 1 person hand held assist Transfers: Sit to/from Stand Sit to Stand: Mod assist;+2 physical assistance         General transfer comment: MOD A to power into standing from EOB; education on not using RUE to power up. Pt continued to attempt to WB thru RUE. Pt required MAX cues to feet placement and facilitation at trunk as pt presenting with flexed posture secondary to pain    Balance Overall balance assessment: Needs assistance Sitting-balance support: Single extremity supported Sitting balance-Leahy Scale: Fair Sitting balance - Comments: close min guard and education to not WB thru RUE   Standing balance support: Bilateral upper extremity supported Standing balance-Leahy Scale: Poor Standing balance comment: MAX A to maintain upright posture                           ADL either performed or assessed with clinical judgement   ADL Overall ADL's : Needs assistance/impaired                 Upper Body Dressing : Maximal assistance;Sitting Upper Body Dressing Details (indicate cue type and reason): to don brace EOB Lower Body Dressing: Total assistance;Bed level Lower Body Dressing Details (indicate cue type and reason): to don new sock Toilet Transfer: Maximal assistance;+2 for physical  assistance;+2 for safety/equipment;Ambulation Toilet Transfer Details (indicate cue type and reason): simulated via functional mobility; MAX A +2 for functional mobility with pt needing max cues to sequence BOS and initiate pivotal steps, pt greatly limited by pain in low back. pt continues to WB through RUE  despite education         Functional mobility during ADLs: Maximal assistance;+2 for safety/equipment;+2 for physical assistance;Cueing for sequencing;Cueing for safety General ADL Comments: pt continues to present with pain, back precautions and cogntivie deficits impacting pts ability to engage in BADLs     Vision       Perception     Praxis      Cognition Arousal/Alertness: Awake/alert Behavior During Therapy: [facility] for tasks assessed/performed Overall Cognitive Status: No family/caregiver present to determine baseline cognitive functioning Area of Impairment: Memory;Safety/judgement;Following commands;Awareness;Problem solving;Attention                   Current Attention Level: Sustained Memory: Decreased recall of precautions Following Commands: Follows one step commands consistently;Follows multi-step commands with increased time;Follows multi-step commands inconsistently Safety/Judgement: Decreased awareness of safety;Decreased awareness of deficits Awareness: Emergent Problem Solving: Slow processing;Requires verbal cues;Decreased initiation;Difficulty sequencing General Comments: pt with no recall of precautions despite extensive education at start of session. pt easily distracted and requires MAX cues to stay on task        Exercises     Shoulder Instructions       General Comments noted residual drainage from RUE dressing; VSS    Pertinent Vitals/ Pain       Pain Assessment: Faces Faces Pain Scale: Hurts even more Pain Location: back Pain Descriptors / Indicators: Grimacing;Guarding;Discomfort;Moaning Pain Intervention(s): Monitored during session;Repositioned  Home Living                                          Prior Functioning/Environment              Frequency  Min 2X/week        Progress Toward Goals  OT Goals(current goals can now be found in the care plan section)  Progress towards OT goals: Progressing  toward goals  Acute Rehab OT Goals Patient Stated Goal: retun to independence OT Goal Formulation: With patient Time For Goal Achievement: 05/10/20 Potential to Achieve Goals: Good  Plan Discharge plan remains appropriate;Frequency remains appropriate    Co-evaluation    PT/OT/SLP Co-Evaluation/Treatment: Yes Reason for Co-Treatment: For patient/therapist safety;To address functional/ADL transfers   OT goals addressed during session: ADL's and self-care      AM-PAC OT "6 Clicks" Daily Activity     Outcome Measure   Help from another person eating meals?: None Help from another person taking care of personal grooming?: A Little Help from another person toileting, which includes using toliet, bedpan, or urinal?: A Lot Help from another person bathing (including washing, rinsing, drying)?: A Lot Help from another person to put on and taking off regular upper body clothing?: A Little Help from another person to put on and taking off regular lower body clothing?: A Lot 6 Click Score: 16    End of Session Equipment Utilized During Treatment: Gait belt;Back brace  OT Visit Diagnosis: Unsteadiness on feet (R26.81);Other abnormalities of gait and mobility (R26.89);Muscle weakness (generalized) (M62.81);Pain   Activity Tolerance Patient tolerated treatment well   Patient Left in chair;with call bell/phone within reach;with chair alarm set  Nurse Communication Mobility status        Time: 2706-2376 OT Time Calculation (min): 23 min  Charges: OT General Charges $OT Visit: 1 Visit OT Treatments $Therapeutic Activity: 8-22 mins  Audery Amel., COTA/L Acute Rehabilitation Services [phone]    Angelina Pih 05/02/2020, 12:22 PM

## 2020-05-02 NOTE — Progress Notes (Signed)
Patient ID: Debra Schmidt, female   DOB: [birth year], 60 y.o.   MRN: [id number] BP 133/82 (BP Location: Left Leg)   Pulse 98   Temp 97.7 F (36.5 C) (Oral)   Resp 20   Ht 5\' 4"  (1.626 m)   Wt 62.6 kg   SpO2 94%   BMI 23.69 kg/m  Alert, oriented x 4 Speech is clear, fluent Moving all extremities Awaiting placement Appreciate medical service, pancreatitis being dealt with

## 2020-05-02 NOTE — Progress Notes (Signed)
Physical Therapy Treatment Patient Details Name: Debra Schmidt MRN: [id number] DOB: November 27, 1960 Today's Date: 05/02/2020    History of Present Illness Pt is a 59 y.o. female admitted on 04/23/20 for emergent decompression of throacolumbar epidural abcess, discitis L1/2 s/p laminectomy L1/2 and evacuation of abcess.  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 She is now s/p I&D and shoulder, AC joint repair. Infectious disease is following suspicious that both R shoulder and back are staph infections.  Blood cultures are pending.  Pt with no significant medical history listed in chart.    PT Comments    Pt seen with OT to optimize participation and mobility. Pt remains to have 10/10 pain in back with mobility, decreased insight to deficits, poor recall of precautions and unable to adhere to [facility] or R LE. Pt requiring max A for transfers and maxAX2 for amb of 10'. Pt with noted confusion as well. Acute PT to cont to follow and progress as able.    Follow Up Recommendations  CIR     Equipment Recommendations  3in1 (PT);Wheelchair (measurements PT);Wheelchair cushion (measurements PT)    Recommendations for Other Services Rehab consult     Precautions / Restrictions Precautions Precautions: Fall;Back Precaution Booklet Issued: Yes (comment) Precaution Comments: no recall, pt re-educated, at end of session no recall Required Braces or Orthoses: Spinal Brace Spinal Brace: Lumbar corset Restrictions Weight Bearing Restrictions: No RUE Weight Bearing: Partial weight bearing RUE Partial Weight Bearing Percentage or Pounds: per ortho nothing strenuous Other Position/Activity Restrictions: no overhead movement    Mobility  Bed Mobility Overal bed mobility: Needs Assistance Bed Mobility: Rolling;Sit to Sidelying Rolling: Min assist Sidelying to sit: Mod assist;+2 for physical assistance       General bed mobility comments: pt required step by  step cues to sequence bed mobility via log roll technique; MOD A +2 to elevate trunk and scoot hips to EOB  Transfers Overall transfer level: Needs assistance Equipment used: 2 person hand held assist Transfers: Sit to/from Stand Sit to Stand: Mod assist;+2 physical assistance         General transfer comment: mod assist to power up due back pain and R UE only being PWB, pt conintues to hold feet close together  Ambulation/Gait Ambulation/Gait assistance: Max assist;+2 physical assistance Gait Distance (Feet): 10 Feet Assistive device: 2 person hand held assist Gait Pattern/deviations: Step-to pattern;Decreased stride length;Antalgic;Trunk flexed Gait velocity: dec Gait velocity interpretation: <1.8 ft/sec, indicate of risk for recurrent falls General Gait Details: transitioned to 3 mousketeer style on the L to help decresaed R UE WBIng as pt was placing FULL WBing on R UE with bilat HHA. Pt with poor sequencing and inability to tolerate walking due to pain, pt maintains feet together   Stairs             Wheelchair Mobility    Modified Rankin (Stroke Patients Only)       Balance Overall balance assessment: Needs assistance Sitting-balance support: Single extremity supported Sitting balance-Leahy Scale: Fair Sitting balance - Comments: close min guard and education to not WB thru RUE   Standing balance support: Bilateral upper extremity supported Standing balance-Leahy Scale: Poor Standing balance comment: MAX A to maintain upright posture                            Cognition Arousal/Alertness: Awake/alert Behavior During Therapy: Flat affect Overall Cognitive Status: No family/caregiver present to  determine baseline cognitive functioning Area of Impairment: Memory;Safety/judgement;Following commands;Awareness;Problem solving;Attention                   Current Attention Level: Sustained Memory: Decreased recall of precautions Following  Commands: Follows one step commands with increased time Safety/Judgement: Decreased awareness of safety;Decreased awareness of deficits Awareness: Emergent Problem Solving: Slow processing;Difficulty sequencing;Requires verbal cues;Requires tactile cues General Comments: pt with no recall of precautions, pt with memory deficits "I have no idea why i'm naked" but she takes her gown off, pt impulsive      Exercises      General Comments General comments (skin integrity, edema, etc.): pt with drainage from R shld incision      Pertinent Vitals/Pain Pain Assessment: 0-10 Pain Score: 8  Faces Pain Scale: Hurts even more Pain Location: back and R shld Pain Descriptors / Indicators: Grimacing;Guarding;Discomfort;Moaning;Spasm Pain Intervention(s): Monitored during session    Home Living                      Prior Function            PT Goals (current goals can now be found in the care plan section) Acute Rehab PT Goals Patient Stated Goal: retun to independence Progress towards PT goals: Progressing toward goals    Frequency    Min 5X/week      PT Plan Current plan remains appropriate    Co-evaluation PT/OT/SLP Co-Evaluation/Treatment: Yes Reason for Co-Treatment: For patient/therapist safety PT goals addressed during session: Mobility/safety with mobility OT goals addressed during session: ADL's and self-care      AM-PAC PT "6 Clicks" Mobility   Outcome Measure  Help needed turning from your back to your side while in a flat bed without using bedrails?: A Lot Help needed moving from lying on your back to sitting on the side of a flat bed without using bedrails?: A Lot Help needed moving to and from a bed to a chair (including a wheelchair)?: A Lot Help needed standing up from a chair using your arms (e.g., wheelchair or bedside chair)?: A Lot Help needed to walk in hospital room?: A Lot Help needed climbing 3-5 steps with a railing? : Total 6 Click  Score: 11    End of Session Equipment Utilized During Treatment: Back brace Activity Tolerance: Patient limited by pain Patient left: in bed;in chair;with chair alarm set;with call bell/phone within reach Nurse Communication: Mobility status;Patient requests pain meds PT Visit Diagnosis: Muscle weakness (generalized) (M62.81);Difficulty in walking, not elsewhere classified (R26.2);Pain;Other symptoms and signs involving the nervous system (R29.898) Pain - Right/Left: Right Pain - part of body: Shoulder     Time: 4696-2952 PT Time Calculation (min) (ACUTE ONLY): 27 min  Charges:  $Gait Training: 8-22 mins                     Lewis Shock, PT, DPT Acute Rehabilitation Services Pager #: [phone] Office #: [phone]    Debra Schmidt 05/02/2020, 2:36 PM

## 2020-05-02 NOTE — Progress Notes (Signed)
Patient denies abdominal pain.  She feels somewhat hungry.    She is sitting up in the bedside chair without evident discomfort.  I think bowel sounds are present.  However, there is considerable subjective tenderness to palpation of the abdomen rather diffusely, particularly in the lower abdomen.  Lipase continues to creep down gradually, currently 106.  Impression:  1.  Acute pancreatitis, resolving, superimposed on radiographic changes of chronic pancreatitis, gradually resolving  2.  Pancreas divisum, which might explain the patient's radiographic findings of chronic pancreatitis.  3.  Gallstones (versus gallbladder sludge), without definite evidence of choledocholithiasis on suboptimal MRCP  Plan: Recheck lipase tomorrow.  Possible initiation of low-fat diet tomorrow.  Florencia Reasons, M.D. Pager [phone] If no answer or after 5 PM call [phone]

## 2020-05-03 LAB — COMPREHENSIVE METABOLIC PANEL
ALT: 12 U/L (ref 0–44)
AST: 39 U/L (ref 15–41)
Albumin: 1.8 g/dL — ABNORMAL LOW (ref 3.5–5.0)
Alkaline Phosphatase: 126 U/L (ref 38–126)
Anion gap: 13 (ref 5–15)
BUN: <5 mg/dL — ABNORMAL LOW (ref 6–20)
CO2: 24 mmol/L (ref 22–32)
Calcium: 9 mg/dL (ref 8.9–10.3)
Chloride: 101 mmol/L (ref 98–111)
Creatinine, Ser: 0.6 mg/dL (ref 0.44–1.00)
GFR calc Af Amer: >60 mL/min (ref >60)
GFR calc non Af Amer: >60 mL/min (ref >60)
Glucose, Bld: 122 mg/dL — ABNORMAL HIGH (ref 70–99)
Potassium: 3.5 mmol/L (ref 3.5–5.1)
Sodium: 138 mmol/L (ref 135–145)
Total Bilirubin: 0.8 mg/dL (ref 0.3–1.2)
Total Protein: 7.2 g/dL (ref 6.5–8.1)

## 2020-05-03 LAB — MAGNESIUM: Magnesium: 1.9 mg/dL (ref 1.7–2.4)

## 2020-05-03 LAB — LIPASE, BLOOD: Lipase: 96 U/L — ABNORMAL HIGH (ref 11–51)

## 2020-05-03 MED ORDER — PHENYLEPHRINE HCL-NACL 10-0.9 MG/250ML-% IV SOLN
INTRAVENOUS | Status: AC
  Filled 2020-05-03: qty 250

## 2020-05-03 NOTE — Progress Notes (Signed)
Physical Therapy Treatment Patient Details Name: Debra Schmidt MRN: [id number] DOB: [birth year] Today's Date: 05/03/2020    History of Present Illness Pt is a 59 y.o. female admitted on 04/23/20 for emergent decompression of throacolumbar epidural abcess, discitis L1/2 s/p laminectomy L1/2 and evacuation of abcess.  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 She is now s/p I&D and shoulder, AC joint repair. Infectious disease is following suspicious that both R shoulder and back are staph infections.  Blood cultures are pending.  Pt with no significant medical history listed in chart.    PT Comments    Pt now cleared for WBAT through R UE by Dr. Renaye Rakers. Pt able to roll today with min guard, continues to require modA for trunk elevation and standing but was able to tolerate 30' of amb with RW. Pt very dependent on bilat UEs due to 8/10 back pain. Pt remains unable to recall back precautions, demonstrating impaired comprehension and memory. Continue to recommend CIR for maximal functional recovery. Acute PT to cont to follow.    Follow Up Recommendations  CIR     Equipment Recommendations  3in1 (PT);Wheelchair (measurements PT);Wheelchair cushion (measurements PT)    Recommendations for Other Services Rehab consult     Precautions / Restrictions Precautions Precautions: Fall;Back Precaution Booklet Issued: Yes (comment) Precaution Comments: no recall, pt re-educated, at end of session no recall Required Braces or Orthoses: Spinal Brace Spinal Brace: Lumbar corset Restrictions Weight Bearing Restrictions: Yes RUE Weight Bearing: Weight bearing as tolerated (per Dr. Renaye Rakers as of 6/22 pt now WBAT)    Mobility  Bed Mobility Overal bed mobility: Needs Assistance Bed Mobility: Rolling;Sit to Sidelying Rolling: Min guard Sidelying to sit: Mod assist       General bed mobility comments: modA for trunk elevation due to  pain  Transfers Overall transfer level: Needs assistance Equipment used: 2 person hand held assist Transfers: Sit to/from Stand Sit to Stand: Mod assist;+2 physical assistance         General transfer comment: mod assist to power up due back pain and R UE only being PWB, pt conintues to hold feet close together  Ambulation/Gait Ambulation/Gait assistance: Mod assist;+2 safety/equipment Gait Distance (Feet): 30 Feet Assistive device: Rolling walker (2 wheeled) Gait Pattern/deviations: Step-through pattern;Decreased stride length Gait velocity: dec Gait velocity interpretation: <1.8 ft/sec, indicate of risk for recurrent falls General Gait Details: pt now able to use RW, denies R UE pain with WBing, pt sequencing L, then R, modA to maintain forward momentum with RW, max encouragement to keep ambulating   Stairs             Wheelchair Mobility    Modified Rankin (Stroke Patients Only)       Balance Overall balance assessment: Needs assistance Sitting-balance support: Single extremity supported Sitting balance-Leahy Scale: Fair Sitting balance - Comments: close min guard and education to not WB thru RUE Postural control: Right lateral lean Standing balance support: Bilateral upper extremity supported Standing balance-Leahy Scale: Poor Standing balance comment: dependent on RW                            Cognition Arousal/Alertness: Awake/alert Behavior During Therapy: Flat affect Overall Cognitive Status: No family/caregiver present to determine baseline cognitive functioning Area of Impairment: Memory;Safety/judgement;Following commands;Awareness;Problem solving;Attention                   Current Attention Level:  Sustained Memory: Decreased recall of precautions Following Commands: Follows one step commands with increased time Safety/Judgement: Decreased awareness of safety;Decreased awareness of deficits Awareness: Emergent Problem Solving:  Slow processing;Difficulty sequencing;Requires verbal cues;Requires tactile cues General Comments: pt with no recall of precautions, pt with memory deficits "I have no idea why i'm naked" but she takes her gown off, pt impulsive      Exercises      General Comments General comments (skin integrity, edema, etc.): Pt with dressing on R shoulder saturated with drainage      Pertinent Vitals/Pain Pain Assessment: 0-10 Pain Score: 8  Pain Location: back, denies R shoulder pain Pain Descriptors / Indicators: Grimacing Pain Intervention(s): Monitored during session    Home Living                      Prior Function            PT Goals (current goals can now be found in the care plan section) Progress towards PT goals: Progressing toward goals    Frequency    Min 5X/week      PT Plan Current plan remains appropriate    Co-evaluation              AM-PAC PT "6 Clicks" Mobility   Outcome Measure  Help needed turning from your back to your side while in a flat bed without using bedrails?: A Little Help needed moving from lying on your back to sitting on the side of a flat bed without using bedrails?: A Little Help needed moving to and from a bed to a chair (including a wheelchair)?: A Lot Help needed standing up from a chair using your arms (e.g., wheelchair or bedside chair)?: A Lot Help needed to walk in hospital room?: A Lot Help needed climbing 3-5 steps with a railing? : Total 6 Click Score: 13    End of Session Equipment Utilized During Treatment: Back brace Activity Tolerance: Patient limited by pain Patient left: in chair;with call bell/phone within reach;with chair alarm set Nurse Communication: Mobility status PT Visit Diagnosis: Muscle weakness (generalized) (M62.81);Difficulty in walking, not elsewhere classified (R26.2);Pain;Other symptoms and signs involving the nervous system (R29.898) Pain - Right/Left: Right Pain - part of body: Shoulder      Time: 2409-7353 PT Time Calculation (min) (ACUTE ONLY): 25 min  Charges:  $Gait Training: 8-22 mins $Therapeutic Activity: 8-22 mins                     Kittie Plater, PT, DPT Acute Rehabilitation Services Pager #: [phone] Office #: [phone]    Berline Lopes 05/03/2020, 1:26 PM

## 2020-05-03 NOTE — Progress Notes (Signed)
Spoke with Debbie from Dr. Greig Right office in regards to the patients need for dressing change orders. I was informed he was seeing patients and he would be given the message regarding my requested needs.

## 2020-05-03 NOTE — Progress Notes (Signed)
Patient ID: Debra Schmidt, female   DOB: Feb 16, 1961, 59 y.o.   MRN: [id number] BP 116/87 (BP Location: Right Arm)   Pulse (!) 109   Temp (!) 97.5 F (36.4 C) (Oral)   Resp 18   Ht 5\' 4"  (1.626 m)   Wt 62.6 kg   SpO2 98%   BMI 23.69 kg/m  Alert and oriented x 4 Pancreatitis is improving, very little pain at this time Awaiting placement Moving lower extremities, did better with today during PT

## 2020-05-03 NOTE — Progress Notes (Signed)
The patient states that she is feeling much better today.   She is hungry and feels ready to eat.  The patient indicates that, despite radiographic evidence of chronic pancreatitis in association with her pancreas divisum, this is her first hospitalization for pancreatitis and her first recognized episode of pancreatitis.  On exam, the abdomen seems to be much less "touchy" or tender compared to yesterday.  Very gradual decline in lipase level continues, currently 96 (normal up to 51).  Impression: Resolving pancreatitis  Recommendations: Initiate low-fat diet and assess clinical tolerance and recheck lipase tomorrow.  The patient was advised to eat only small portions at a time.  Florencia Reasons, M.D. Pager [phone] If no answer or after 5 PM call [phone]

## 2020-05-03 NOTE — TOC Initial Note (Signed)
Transition of Care (TOC) - Initial/Assessment Note  Debra Pierini RN,BSN Transitions of Care Unit 4NP (non trauma) - RN Case Manager [phone]   Patient Details  Name: Debra Schmidt MRN: [id number] Date of Birth: 16-Feb-1961  Transition of Care [facility]) CM/SW Contact:    Debra Span, RN Phone Number: 05/03/2020, 3:44 PM  Clinical Narrative:                 Pt from home with spouse- s/p I&D for septic shoulder, MSSA bacteremia and will nee 6 wks of IV abx- July 24 end date. Picc line has been placed- CIR was following for possible admit- however pt and spouse have decided that due to cost will not pursue CIR and want to return home. Pt will need HHRN/PT orders placed. CM spoke with pt at bedside regarding transition of care needs for home and charity [facility] referral for home health needs including home IV abx needs. Pt states she will need RW and 3n1 for home. Agreeable to referrals for charity [facility]. At this time Debra Schmidt is [facility] agency this week. Call made to [facility] with [facility]- however was told they are unable to accept for IV abx at this time- will reach out to the other charity agencies. Have also spoken to [facility] leadership about possible HH barrier. Call made to [facility] with [facility] infusion pharmacy for home IV abx needs- Debra Schmidt will f/u with pt regarding home abx cost.  Pt also will need new PCP- agreeable to [facility] clinics- will call for f/u appointment.  TOC to follow for continued transition of care needs for home.   Expected Discharge Plan: IP Rehab Facility Barriers to Discharge: Continued Medical Work up   Patient Goals and CMS Choice Patient states their goals for this hospitalization and ongoing recovery are:: "get better and be walking" CMS Medicare.gov Compare Post Acute Care list provided to:: Patient Choice offered to / list presented to : NA (charity)  Expected Discharge Plan and Services Expected Discharge Plan: IP Rehab Facility   Discharge Planning Services: CM  Consult, Medication Assistance, Indigent Health Clinic, Follow-up appt scheduled   Living arrangements for the past 2 months: Single Family Home                 DME Arranged: 3-N-1, Walker rolling DME Agency: [employer]       HH Arranged: RN, IV Antibiotics, PT          Prior Living Arrangements/Services Living arrangements for the past 2 months: Single Family Home Lives with:: Spouse Patient language and need for interpreter reviewed:: Yes Do you feel safe going back to the place where you live?: Yes      Need for Family Participation in Patient Care: Yes (Comment) Care giver support system in place?: Yes (comment)   Criminal Activity/Legal Involvement Pertinent to Current Situation/Hospitalization: No - Comment as needed  Activities of Daily Living Home Assistive Devices/Equipment: Cane (specify quad or straight) ADL Screening (condition at time of admission) Patient's cognitive ability adequate to safely complete daily activities?: Yes Is the patient deaf or have difficulty hearing?: No Does the patient have difficulty seeing, even when wearing glasses/contacts?: No Does the patient have difficulty concentrating, remembering, or making decisions?: No Patient able to express need for assistance with ADLs?: Yes Does the patient have difficulty dressing or bathing?: No Independently performs ADLs?: Yes (appropriate for developmental age) Does the patient have difficulty walking or climbing stairs?: Yes (recent new use of cane past couple of months) Weakness  of Legs: Both Weakness of Arms/Hands: None  Permission Sought/Granted Permission sought to share information with : Chartered certified accountant granted to share information with : Yes, Verbal Permission Granted     Permission granted to share info w AGENCY: [place] agencies        Emotional Assessment Appearance:: Appears stated age Attitude/Demeanor/Rapport: Engaged Affect (typically observed):  Appropriate, Pleasant Orientation: : Oriented to Self, Oriented to Place, Oriented to  Time, Oriented to Situation Alcohol / Substance Use: Not Applicable Psych Involvement: No (comment)  Admission diagnosis:  Hypokalemia [E87.6] Hypomagnesemia [E83.42] Abscess [L02.91] 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 MSSA bacteremia 04/25/2020  . Septic arthritis of right acromioclavicular joint ([place]) 04/24/2020  . 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 Cigarette smoker 04/24/2020  . Status post surgery 04/23/2020  . Abscess in epidural space of lumbar spine   . Hypokalemia   . Hypomagnesemia    PCP:  Patient, No Pcp Per Pharmacy:   [id number] - [place], [place] [address] [place] [address] Phone: [phone] Fax: [phone]  [id number] [place], [place] [address] [place] [address] Phone: [phone] Fax: [phone]     Social Determinants of Health ([place]) Interventions    Readmission Risk Interventions No flowsheet data found.

## 2020-05-03 NOTE — Progress Notes (Addendum)
PROGRESS NOTE    Debra Schmidt  SVX:[id number] DOB: July 04, 1961 DOA: 04/23/2020 PCP: Patient, No Pcp Per    Brief Narrative:  59 y.o.female with remote history of Xanax and hydrocodone use presented to the ED 6/13 , she states that she fell in the bathtub several weeks ago injuring her right shoulder, eventually developed a draining wound on the top of her shoulder. She began to have progressively more severe back pain left urinary incontinence leading to admission 6/13. MRI showed L1-2 discitis with thoracolumbar abscess. She underwent emergent surgery by Dr. Christella Noa 6/13, OR cultures with MSSA, also noted to have right shoulder acromioclavicular septic arthritis and clavicular osteomyelitis on CT  Assessment & Plan:   Principal Problem:   MSSA bacteremia Active Problems:   Abscess in epidural space of lumbar spine   Hypokalemia   Hypomagnesemia   Status post surgery   Septic arthritis of right acromioclavicular joint (HCC)   Osteomyelitis of clavicle (HCC)   Cigarette smoker    MSSA bacteremia Epidural abscess with cauda equina Right shoulder septic arthritis and clavicular osteomyelitis infectious disease following -Antibiotics per ID recommendations, plan for minimum or 6 weeks of ancef, PICC to be placed. ID to f/u in clinic next month -Underwent L1/2 laminectomy, epidural abscess evacuation on 6/13 -Follow-up OR cultures, anticipate MSSA -Pt underwent I&D and distal clavicle resection 6/15 per ortho.  Management per ID and neurosurgery.  Chronic hypokalemia and hypomagnesemia -Etiology not clear although albumin is <2. Suspect marked malnutrition in the setting of chronic pancreatitis prior to admit. Have consulted dietitian -Chart reviewed. Pt has hx of profound hypokalemia, first documented on 02/24/20 with a K of 2.7 -random cortisol level normal -Pt has required multiple doses of potassium replacement -Potassium now much improved after multiple doses of potassium  and magnesium replacement (have calculated 1,058mEq of potassium given since presentation) Potassium has remained normal since 04/29/2020.  Acute on chronic anemia -There is no CBC done on this patient since 04/26/2020.  Assuming care of this patient today.  Will check CBC tomorrow.  Hypertension -BP trends somewhat improved since starting lisinopril -Cont to titrate BP meds accordingly, stable presently   Tachycardia with tachypnea -Patient attributes to post-op pain. Patient reports marked pain, described as cramping, corresponding to times of documented tachycardia -CTA chest obtained and reviewed, neg for PE, however with incidental finding of acute on chronic pancreatitis, see below -VS have since improved with IVF hydration, see below  Acute on chronic pancreatitis/ pancreatic divisum -Noted incidentally on CTA chest  -Lipid panel unremarkable -RUQ [place] ordered and reviewed. Finding of 1.2cm dilated CBD with GB sludge.  GI on board and managing.  Advanced her diet to low-fat diet/soft diet. Seen by general surgery.  They recommended outpatient follow-up for laparoscopic cholecystectomy.  DVT prophylaxis: SCD's Code Status: Full Family Communication: Pt in room, family not at bedside  Consultants:   Orthopedic surgery  ID  Procedures:  PRE-OPERATIVE DIAGNOSIS: Epidural Mass  POST-OPERATIVE DIAGNOSIS: Epidural Abscess, L1/2 discitis  PROCEDURE: Procedure(s): Lumbar One-Two LAMINECTOMY for Epidural Abscess Evacuation  SURGEON:Surgeon(s):  Ashok Pall, MD   Antimicrobials: Anti-infectives (From admission, onward)   Start     Dose/Rate Route Frequency Ordered Stop   04/25/20 1641  vancomycin (VANCOCIN) powder  Status:  Discontinued          As needed 04/25/20 1641 04/25/20 1657   04/25/20 1200  ceFAZolin (ANCEF) IVPB 2g/100 mL premix     Discontinue     2 g 200 mL/hr over 30  Minutes Intravenous Every 8 hours 04/25/20 0930 06/03/20 2359   04/24/20 1200   vancomycin (VANCOREADY) IVPB 750 mg/150 mL  Status:  Discontinued        750 mg 150 mL/hr over 60 Minutes Intravenous Every 12 hours 04/24/20 0156 04/25/20 0930   04/24/20 0800  piperacillin-tazobactam (ZOSYN) IVPB 3.375 g  Status:  Discontinued        3.375 g 12.5 mL/hr over 240 Minutes Intravenous Every 8 hours 04/24/20 0156 04/24/20 1423   04/23/20 2215  piperacillin-tazobactam (ZOSYN) IVPB 3.375 g        3.375 g 100 mL/hr over 30 Minutes Intravenous  Once 04/23/20 2202 04/24/20 0031   04/23/20 2215  vancomycin (VANCOREADY) IVPB 1250 mg/250 mL        1,250 mg 166.7 mL/hr over 90 Minutes Intravenous  Once 04/23/20 2202 04/24/20 0101      Subjective: Seen and examined.  Feels much better.  No abdominal pain no nausea.  Requesting to advance her diet.  Objective: Vitals:   05/02/20 2350 05/03/20 0353 05/03/20 0753 05/03/20 1213  BP: 118/73 120/71 119/76 121/82  Pulse: 95 93 95 (!) 108  Resp: 16 14 16 18   Temp: 97.6 F (36.4 C) 97.9 F (36.6 C) 98.1 F (36.7 C) 97.9 F (36.6 C)  TempSrc: Oral Oral Oral Oral  SpO2: 96% 98% 97% 99%  Weight:      Height:        Intake/Output Summary (Last 24 hours) at 05/03/2020 1306 Last data filed at 05/03/2020 0947 Gross per 24 hour  Intake 1363.25 ml  Output 1800 ml  Net -436.75 ml   Filed Weights   04/23/20 1332  Weight: 62.6 kg    Examination: General exam: Appears calm and comfortable  Respiratory system: Clear to auscultation. Respiratory effort normal. Cardiovascular system: S1 & S2 heard, RRR. No JVD, murmurs, rubs, gallops or clicks. No pedal edema. Gastrointestinal system: Abdomen is nondistended, soft and nontender. No organomegaly or masses felt. Normal bowel sounds heard. Extremities: Symmetric 5 x 5 power. Skin: No rashes, lesions or ulcers.  Psychiatry: Judgement and insight appear normal. Mood & affect appropriate.   Data Reviewed: I have personally reviewed following labs and imaging studies  CBC: No results for  input(s): WBC, NEUTROABS, HGB, HCT, MCV, PLT in the last 168 hours. Basic Metabolic Panel: Recent Labs  Lab 04/29/20 0544 04/30/20 0134 05/01/20 0532 05/02/20 0500 05/03/20 0551  NA 136 136 136 134* 138  K 3.9 3.8 3.6 4.3 3.5  CL 102 102 103 101 101  CO2 22 25 24 22 24   GLUCOSE 109* 102* 128* 119* 122*  BUN 5* <5* 6 <5* <5*  CREATININE 0.57 0.55 0.53 0.55 0.60  CALCIUM 8.3* 8.2* 8.4* 8.7* 9.0  MG 1.8 1.6* 1.8 1.8 1.9   GFR: Estimated Creatinine Clearance: 65.4 mL/min (by C-G formula based on SCr of 0.6 mg/dL). Liver Function Tests: Recent Labs  Lab 04/29/20 0544 04/30/20 0134 05/01/20 0532 05/02/20 0500 05/03/20 0551  AST 18 29 50* 36 39  ALT 8 10 15 13 12   ALKPHOS 70 86 132* 139* 126  BILITOT 0.6 0.7 0.7 1.0 0.8  PROT 6.2* 6.1* 6.6 7.0 7.2  ALBUMIN 1.7* 1.6* 1.7* 1.8* 1.8*   Recent Labs  Lab 04/29/20 1722 04/30/20 0134 05/01/20 0532 05/02/20 0500 05/03/20 0551  LIPASE 140* 137* 127* 106* 96*   No results for input(s): AMMONIA in the last 168 hours. Coagulation Profile: No results for input(s): INR, PROTIME in the last 168  hours. Cardiac Enzymes: No results for input(s): CKTOTAL, CKMB, CKMBINDEX, TROPONINI in the last 168 hours. BNP (last 3 results) No results for input(s): PROBNP in the last 8760 hours. HbA1C: No results for input(s): HGBA1C in the last 72 hours. CBG: No results for input(s): GLUCAP in the last 168 hours. Lipid Profile: No results for input(s): CHOL, HDL, LDLCALC, TRIG, CHOLHDL, LDLDIRECT in the last 72 hours. Thyroid Function Tests: No results for input(s): TSH, T4TOTAL, FREET4, T3FREE, THYROIDAB in the last 72 hours. Anemia Panel: No results for input(s): VITAMINB12, FOLATE, FERRITIN, TIBC, IRON, RETICCTPCT in the last 72 hours. Sepsis Labs: Recent Labs  Lab 04/29/20 1813 04/30/20 0134  LATICACIDVEN 0.7 0.8    Recent Results (from the past 240 hour(s))  SARS Coronavirus 2 by RT PCR (hospital order, performed in [facility]  hospital lab) Nasopharyngeal Nasopharyngeal Swab     Status: None   Collection Time: 04/23/20  6:51 PM   Specimen: Nasopharyngeal Swab  Result Value Ref Range Status   SARS Coronavirus 2 NEGATIVE NEGATIVE Final    Comment: (NOTE) SARS-CoV-2 target nucleic acids are NOT DETECTED.  The SARS-CoV-2 RNA is generally detectable in upper and lower respiratory specimens during the acute phase of infection. The lowest concentration of SARS-CoV-2 viral copies this assay can detect is 250 copies / mL. A negative result does not preclude SARS-CoV-2 infection and should not be used as the sole basis for treatment or other patient management decisions.  A negative result may occur with improper specimen collection / handling, submission of specimen other than nasopharyngeal swab, presence of viral mutation(s) within the areas targeted by this assay, and inadequate number of viral copies (<250 copies / mL). A negative result must be combined with clinical observations, patient history, and epidemiological information.  Fact Sheet for Patients:   BoilerBrush.com.cy  Fact Sheet for Healthcare Providers: https://pope.com/  This test is not yet approved or  cleared by the [place] FDA and has been authorized for detection and/or diagnosis of SARS-CoV-2 by FDA under an Emergency Use Authorization (EUA).  This EUA will remain in effect (meaning this test can be used) for the duration of the COVID-19 declaration under Section 564(b)(1) of the Act, 21 U.S.C. section 360bbb-3(b)(1), unless the authorization is terminated or revoked sooner.  Performed at [facility], [address] [place], [place] [address]   Blood culture (routine x 2)     Status: Abnormal   Collection Time: 04/23/20  7:30 PM   Specimen: BLOOD  Result Value Ref Range Status   Specimen Description BLOOD RIGHT ANTECUBITAL  Final   Special Requests   Final    BOTTLES DRAWN  AEROBIC AND ANAEROBIC Blood Culture adequate volume   Culture  Setup Time   Final    AEROBIC BOTTLE ONLY GRAM POSITIVE COCCI IN CLUSTERS CRITICAL RESULT CALLED TO, READ BACK BY AND VERIFIED WITH: Tori Milks [facility] 04/24/20 2317 JDW Performed at [facility], [address], [place], [place] [address]    Culture STAPHYLOCOCCUS AUREUS (A)  Final   Report Status 04/26/2020 FINAL  Final   Organism ID, Bacteria STAPHYLOCOCCUS AUREUS  Final      Susceptibility   Staphylococcus aureus - MIC*    CIPROFLOXACIN <=0.5 SENSITIVE Sensitive     ERYTHROMYCIN <=0.25 SENSITIVE Sensitive     GENTAMICIN <=0.5 SENSITIVE Sensitive     OXACILLIN <=0.25 SENSITIVE Sensitive     TETRACYCLINE <=1 SENSITIVE Sensitive     VANCOMYCIN <=0.5 SENSITIVE Sensitive  TRIMETH/SULFA <=10 SENSITIVE Sensitive     CLINDAMYCIN <=0.25 SENSITIVE Sensitive     RIFAMPIN <=0.5 SENSITIVE Sensitive     Inducible Clindamycin NEGATIVE Sensitive     * STAPHYLOCOCCUS AUREUS  Blood Culture ID Panel (Reflexed)     Status: Abnormal   Collection Time: 04/23/20  7:30 PM  Result Value Ref Range Status   Enterococcus species NOT DETECTED NOT DETECTED Final   Listeria monocytogenes NOT DETECTED NOT DETECTED Final   Staphylococcus species DETECTED (A) NOT DETECTED Final    Comment: CRITICAL RESULT CALLED TO, READ BACK BY AND VERIFIED WITH: L SEAY PHARMD 04/24/20 2317 JDW    Staphylococcus aureus (BCID) DETECTED (A) NOT DETECTED Final    Comment: Methicillin (oxacillin) susceptible Staphylococcus aureus (MSSA). Preferred therapy is anti staphylococcal beta lactam antibiotic (Cefazolin or Nafcillin), unless clinically contraindicated. CRITICAL RESULT CALLED TO, READ BACK BY AND VERIFIED WITH: L SEAY PHARMD 04/24/20 2317 JDW    Methicillin resistance NOT DETECTED NOT DETECTED Final   Streptococcus species NOT DETECTED NOT DETECTED Final   Streptococcus agalactiae NOT DETECTED NOT DETECTED Final   Streptococcus pneumoniae NOT DETECTED NOT  DETECTED Final   Streptococcus pyogenes NOT DETECTED NOT DETECTED Final   Acinetobacter baumannii NOT DETECTED NOT DETECTED Final   Enterobacteriaceae species NOT DETECTED NOT DETECTED Final   Enterobacter cloacae complex NOT DETECTED NOT DETECTED Final   Escherichia coli NOT DETECTED NOT DETECTED Final   Klebsiella oxytoca NOT DETECTED NOT DETECTED Final   Klebsiella pneumoniae NOT DETECTED NOT DETECTED Final   Proteus species NOT DETECTED NOT DETECTED Final   Serratia marcescens NOT DETECTED NOT DETECTED Final   Haemophilus influenzae NOT DETECTED NOT DETECTED Final   Neisseria meningitidis NOT DETECTED NOT DETECTED Final   Pseudomonas aeruginosa NOT DETECTED NOT DETECTED Final   Candida albicans NOT DETECTED NOT DETECTED Final   Candida glabrata NOT DETECTED NOT DETECTED Final   Candida krusei NOT DETECTED NOT DETECTED Final   Candida parapsilosis NOT DETECTED NOT DETECTED Final   Candida tropicalis NOT DETECTED NOT DETECTED Final    Comment: Performed at [facility], [address] [place], [place] [address]  Blood culture (routine x 2)     Status: None   Collection Time: 04/23/20  7:35 PM   Specimen: BLOOD RIGHT FOREARM  Result Value Ref Range Status   Specimen Description BLOOD RIGHT FOREARM  Final   Special Requests   Final    BOTTLES DRAWN AEROBIC AND ANAEROBIC Blood Culture results may not be optimal due to an inadequate volume of blood received in culture bottles   Culture   Final    NO GROWTH 5 DAYS Performed at [facility], [address], [place], [place] [address]    Report Status 04/28/2020 FINAL  Final  Aerobic/Anaerobic Culture (surgical/deep wound)     Status: None   Collection Time: 04/23/20 10:37 PM   Specimen: Abscess  Result Value Ref Range Status   Specimen Description ABSCESS  Final   Special Requests THORACOULMBAR  Final   Gram Stain   Final    FEW WBC PRESENT, PREDOMINANTLY PMN FEW GRAM POSITIVE COCCI CRITICAL RESULT CALLED TO, READ  BACK BY AND VERIFIED WITH: RN T ROWE @0107  04/24/20 BY S GEZAHEGN    Culture   Final    FEW STAPHYLOCOCCUS AUREUS NO ANAEROBES ISOLATED Performed at [facility], [address] [place], [place]    Report Status 04/29/2020 FINAL  Final   Organism ID, Bacteria  STAPHYLOCOCCUS AUREUS  Final      Susceptibility   Staphylococcus aureus - MIC*    CIPROFLOXACIN <=0.5 SENSITIVE Sensitive     ERYTHROMYCIN <=0.25 SENSITIVE Sensitive     GENTAMICIN <=0.5 SENSITIVE Sensitive     OXACILLIN <=0.25 SENSITIVE Sensitive     TETRACYCLINE <=1 SENSITIVE Sensitive     VANCOMYCIN <=0.5 SENSITIVE Sensitive     TRIMETH/SULFA <=10 SENSITIVE Sensitive     CLINDAMYCIN <=0.25 SENSITIVE Sensitive     RIFAMPIN <=0.5 SENSITIVE Sensitive     Inducible Clindamycin NEGATIVE Sensitive     * FEW STAPHYLOCOCCUS AUREUS  Aerobic/Anaerobic Culture (surgical/deep wound)     Status: None   Collection Time: 04/23/20 10:37 PM   Specimen: Soft Tissue, Other  Result Value Ref Range Status   Specimen Description ABSCESS  Final   Special Requests EPIDURAL  Final   Gram Stain NO WBC SEEN NO ORGANISMS SEEN   Final   Culture   Final    FEW STAPHYLOCOCCUS AUREUS NO ANAEROBES ISOLATED Performed at [facility], [address], [place], [place] [address]    Report Status 04/29/2020 FINAL  Final   Organism ID, Bacteria STAPHYLOCOCCUS AUREUS  Final      Susceptibility   Staphylococcus aureus - MIC*    CIPROFLOXACIN <=0.5 SENSITIVE Sensitive     ERYTHROMYCIN <=0.25 SENSITIVE Sensitive     GENTAMICIN <=0.5 SENSITIVE Sensitive     OXACILLIN <=0.25 SENSITIVE Sensitive     TETRACYCLINE <=1 SENSITIVE Sensitive     VANCOMYCIN <=0.5 SENSITIVE Sensitive     TRIMETH/SULFA <=10 SENSITIVE Sensitive     CLINDAMYCIN <=0.25 SENSITIVE Sensitive     RIFAMPIN <=0.5 SENSITIVE Sensitive     Inducible Clindamycin NEGATIVE Sensitive     * FEW STAPHYLOCOCCUS AUREUS  MRSA PCR Screening     Status: None   Collection Time:  04/24/20  1:50 AM   Specimen: Nasal Mucosa; Nasopharyngeal  Result Value Ref Range Status   MRSA by PCR NEGATIVE NEGATIVE Final    Comment:        The GeneXpert MRSA Assay (FDA approved for NASAL specimens only), is one component of a comprehensive MRSA colonization surveillance program. It is not intended to diagnose MRSA infection nor to guide or monitor treatment for MRSA infections. Performed at [facility], [address], [place], [place] [address]   Culture, blood (routine x 2)     Status: None   Collection Time: 04/24/20  8:20 AM   Specimen: BLOOD LEFT ARM  Result Value Ref Range Status   Specimen Description BLOOD LEFT ARM  Final   Special Requests   Final    BOTTLES DRAWN AEROBIC ONLY Blood Culture adequate volume   Culture   Final    NO GROWTH 5 DAYS Performed at [facility], [address], [place], [place] [address]    Report Status 04/29/2020 FINAL  Final  Culture, blood (routine x 2)     Status: None   Collection Time: 04/24/20  8:20 AM   Specimen: BLOOD LEFT ARM  Result Value Ref Range Status   Specimen Description BLOOD LEFT ARM  Final   Special Requests   Final    BOTTLES DRAWN AEROBIC ONLY Blood Culture results may not be optimal due to an inadequate volume of blood received in culture bottles   Culture   Final    NO GROWTH 5 DAYS Performed at [facility], [address] [place], [place] [address]  Report Status 04/29/2020 FINAL  Final  Culture, blood (routine x 2)     Status: None   Collection Time: 04/25/20  7:08 AM   Specimen: BLOOD  Result Value Ref Range Status   Specimen Description BLOOD RIGHT ANTECUBITAL  Final   Special Requests   Final    BOTTLES DRAWN AEROBIC AND ANAEROBIC Blood Culture adequate volume   Culture   Final    NO GROWTH 5 DAYS Performed at [facility], [address] [place], [place] [address]    Report Status 04/30/2020 FINAL  Final  Culture, blood (routine x 2)     Status: None    Collection Time: 04/25/20  7:14 AM   Specimen: BLOOD  Result Value Ref Range Status   Specimen Description BLOOD LEFT ANTECUBITAL  Final   Special Requests   Final    BOTTLES DRAWN AEROBIC AND ANAEROBIC Blood Culture adequate volume   Culture   Final    NO GROWTH 5 DAYS Performed at [facility], [address] [place], [place] [address]    Report Status 04/30/2020 FINAL  Final  Surgical PCR screen     Status: None   Collection Time: 04/25/20  7:54 AM   Specimen: Nasal Mucosa; Nasal Swab  Result Value Ref Range Status   MRSA, PCR NEGATIVE NEGATIVE Final   Staphylococcus aureus NEGATIVE NEGATIVE Final    Comment: (NOTE) The Xpert SA Assay (FDA approved for NASAL specimens in patients 60 years of age and older), is one component of a comprehensive surveillance program. It is not intended to diagnose infection nor to guide or monitor treatment. Performed at [facility], [address] [place], [place] [address]   Aerobic/Anaerobic Culture (surgical/deep wound)     Status: None   Collection Time: 04/25/20  4:44 PM   Specimen: Wound  Result Value Ref Range Status   Specimen Description WOUND  Final   Special Requests RT ACROMIOCLAVICULAR JOINT  Final   Gram Stain   Final    FEW WBC PRESENT, PREDOMINANTLY PMN NO ORGANISMS SEEN    Culture   Final    RARE STAPHYLOCOCCUS AUREUS NO ANAEROBES ISOLATED Performed at [facility], [address] [place], [place] [address]    Report Status 04/30/2020 FINAL  Final   Organism ID, Bacteria STAPHYLOCOCCUS AUREUS  Final      Susceptibility   Staphylococcus aureus - MIC*    CIPROFLOXACIN <=0.5 SENSITIVE Sensitive     ERYTHROMYCIN <=0.25 SENSITIVE Sensitive     GENTAMICIN <=0.5 SENSITIVE Sensitive     OXACILLIN <=0.25 SENSITIVE Sensitive     TETRACYCLINE <=1 SENSITIVE Sensitive     VANCOMYCIN <=0.5 SENSITIVE Sensitive     TRIMETH/SULFA <=10 SENSITIVE Sensitive     CLINDAMYCIN <=0.25 SENSITIVE Sensitive     RIFAMPIN  <=0.5 SENSITIVE Sensitive     Inducible Clindamycin NEGATIVE Sensitive     * RARE STAPHYLOCOCCUS AUREUS     Radiology Studies: No results found.  Scheduled Meds: . amLODipine  5 mg Oral Daily  . Chlorhexidine Gluconate Cloth  6 each Topical Daily  . docusate sodium  100 mg Oral BID  . feeding supplement (ENSURE ENLIVE)  237 mL Oral BID BM  . lisinopril  10 mg Oral Daily  . nutrition supplement (JUVEN)  1 packet Oral BID BM  . oxyCODONE  15 mg Oral Q12H  . pantoprazole  40 mg Oral QHS  . sodium chloride flush  10-40 mL Intracatheter Q12H  .  sodium chloride flush  3 mL Intravenous Q12H   Continuous Infusions: . sodium chloride    . sodium chloride    .  ceFAZolin (ANCEF) IV 2 g (05/03/20 1302)  . lactated ringers 100 mL/hr at 05/03/20 0616  . methocarbamol (ROBAXIN) IV 500 mg (04/28/20 0349)     LOS: 10 days   Hughie Closs, MD [facility] Pager On Amion  If 7PM-7AM, please contact night-coverage 05/03/2020, 1:06 PM

## 2020-05-03 NOTE — Progress Notes (Signed)
Inpatient Rehabilitation-Admissions Coordinator   Spoke with pt and her husband via phone to confirm continued interest in CIR program. Per [facility] note, it does not appear that the patient will qualify for Medicaid at this time. The pt and her husband do not feel they can agree to the self pay cost for CIR and have elected to take her home once medically ready. AC will notify TOC team regarding pt/husband preference.   AC will sign off.   Cheri Rous, OTR/L  Rehab Admissions Coordinator  [phone] 05/03/2020 11:16 AM

## 2020-05-04 LAB — COMPREHENSIVE METABOLIC PANEL
ALT: 10 U/L (ref 0–44)
AST: 32 U/L (ref 15–41)
Albumin: 1.7 g/dL — ABNORMAL LOW (ref 3.5–5.0)
Alkaline Phosphatase: 107 U/L (ref 38–126)
Anion gap: 11 (ref 5–15)
BUN: <5 mg/dL — ABNORMAL LOW (ref 6–20)
CO2: 26 mmol/L (ref 22–32)
Calcium: 8.7 mg/dL — ABNORMAL LOW (ref 8.9–10.3)
Chloride: 100 mmol/L (ref 98–111)
Creatinine, Ser: 0.55 mg/dL (ref 0.44–1.00)
GFR calc Af Amer: >60 mL/min (ref >60)
GFR calc non Af Amer: >60 mL/min (ref >60)
Glucose, Bld: 119 mg/dL — ABNORMAL HIGH (ref 70–99)
Potassium: 3.3 mmol/L — ABNORMAL LOW (ref 3.5–5.1)
Sodium: 137 mmol/L (ref 135–145)
Total Bilirubin: 0.6 mg/dL (ref 0.3–1.2)
Total Protein: 6.6 g/dL (ref 6.5–8.1)

## 2020-05-04 LAB — CBC WITH DIFFERENTIAL/PLATELET
Abs Immature Granulocytes: 0.07 10*3/uL (ref 0.00–0.07)
Basophils Absolute: 0.1 10*3/uL (ref 0.0–0.1)
Basophils Relative: 1 %
Eosinophils Absolute: 0.3 10*3/uL (ref 0.0–0.5)
Eosinophils Relative: 4 %
HCT: 26.2 % — ABNORMAL LOW (ref 36.0–46.0)
Hemoglobin: 8.1 g/dL — ABNORMAL LOW (ref 12.0–15.0)
Immature Granulocytes: 1 %
Lymphocytes Relative: 11 %
Lymphs Abs: 1 10*3/uL (ref 0.7–4.0)
MCH: 26.6 pg (ref 26.0–34.0)
MCHC: 30.9 g/dL (ref 30.0–36.0)
MCV: 86.2 fL (ref 80.0–100.0)
Monocytes Absolute: 0.5 10*3/uL (ref 0.1–1.0)
Monocytes Relative: 6 %
Neutro Abs: 6.6 10*3/uL (ref 1.7–7.7)
Neutrophils Relative %: 77 %
Platelets: 604 10*3/uL — ABNORMAL HIGH (ref 150–400)
RBC: 3.04 MIL/uL — ABNORMAL LOW (ref 3.87–5.11)
RDW: 14.4 % (ref 11.5–15.5)
WBC: 8.6 10*3/uL (ref 4.0–10.5)
nRBC: 0 % (ref 0.0–0.2)

## 2020-05-04 LAB — LIPASE, BLOOD: Lipase: 91 U/L — ABNORMAL HIGH (ref 11–51)

## 2020-05-04 MED ORDER — ADULT MULTIVITAMIN W/MINERALS CH
1.0000 | ORAL_TABLET | Freq: Every day | ORAL | Status: DC
  Administered 2020-05-04 – 2020-05-09 (×6): 1 via ORAL
  Filled 2020-05-04 (×6): qty 1

## 2020-05-04 NOTE — Progress Notes (Signed)
PROGRESS NOTE    Debra Schmidt  RJJ:[id number] DOB: [birth year] DOA: 04/23/2020 PCP: Patient, No Pcp Per    Brief Narrative:  59 y.o.female with remote history of Xanax and hydrocodone use presented to the ED 6/13 , she states that she fell in the bathtub several weeks ago injuring her right shoulder, eventually developed a draining wound on the top of her shoulder. She began to have progressively more severe back pain left urinary incontinence leading to admission 6/13. MRI showed L1-2 discitis with thoracolumbar abscess. She underwent emergent surgery by Dr. Christella Noa 6/13, OR cultures with MSSA, also noted to have right shoulder acromioclavicular septic arthritis and clavicular osteomyelitis on CT  Assessment & Plan:   Principal Problem:   MSSA bacteremia Active Problems:   Abscess in epidural space of lumbar spine   Hypokalemia   Hypomagnesemia   Status post surgery   Septic arthritis of right acromioclavicular joint (HCC)   Osteomyelitis of clavicle (HCC)   Cigarette smoker    MSSA bacteremia Epidural abscess with cauda equina Right shoulder septic arthritis and clavicular osteomyelitis infectious disease following -Antibiotics per ID recommendations, plan for minimum or 6 weeks of ancef,  ID to f/u in clinic next month -Underwent L1/2 laminectomy, epidural abscess evacuation on 6/13 -Pt underwent I&D and distal clavicle resection 6/15 per ortho.  Management per ID and neurosurgery.  Chronic hypokalemia and hypomagnesemia -Etiology not clear although albumin is <2. Suspect marked malnutrition in the setting of chronic pancreatitis prior to admit. Have consulted dietitian -Chart reviewed. Pt has hx of profound hypokalemia, first documented on 02/24/20 with a K of 2.7 -random cortisol level normal -Pt has required multiple doses of potassium replacement -PotassiumLow at 3.3 once again.we will replace and place orders for potassium replacement scheduled for daily.  After  multiple doses of potassium and magnesium replacement (have calculated 1,085mEq of potassium given since presentation until 05/03/2020) Potassium has remained normal since 04/29/2020.  Acute on chronic anemia -There is no CBC done on this patient since 04/26/2020.  Assuming care of this patient today.  Will check CBC tomorrow.  Hypertension -BP trends somewhat improved since starting lisinopril -Cont to titrate BP meds accordingly, stable presently   Tachycardia with tachypnea -Patient attributes to post-op pain. Patient reports marked pain, described as cramping, corresponding to times of documented tachycardia -CTA chest obtained and reviewed, neg for PE, however with incidental finding of acute on chronic pancreatitis, see below -VS have since improved with IVF hydration, see below  Acute on chronic pancreatitis/ pancreatic divisum -Noted incidentally on CTA chest  -Lipid panel unremarkable -RUQ [place] ordered and reviewed. Finding of 1.2cm dilated CBD with GB sludge.  GI on board and managing.  Advanced her diet to low-fat diet/soft diet. Seen by general surgery.  They recommended outpatient follow-up for laparoscopic cholecystectomy.  Pancreatitis has resolved now.  DVT prophylaxis: SCD's Code Status: Full Family Communication: Pt in room, family not at bedside  Consultants:   Orthopedic surgery  ID  TRH hospitalist  GI  Procedures:  PRE-OPERATIVE DIAGNOSIS: Epidural Mass  POST-OPERATIVE DIAGNOSIS: Epidural Abscess, L1/2 discitis  PROCEDURE: Procedure(s): Lumbar One-Two LAMINECTOMY for Epidural Abscess Evacuation  SURGEON:Surgeon(s):  Ashok Pall, MD   Antimicrobials: Anti-infectives (From admission, onward)   Start     Dose/Rate Route Frequency Ordered Stop   04/25/20 1641  vancomycin (VANCOCIN) powder  Status:  Discontinued          As needed 04/25/20 1641 04/25/20 1657   04/25/20 1200  ceFAZolin (ANCEF) IVPB 2g/100  mL premix     Discontinue     2  g 200 mL/hr over 30 Minutes Intravenous Every 8 hours 04/25/20 0930 06/03/20 2359   04/24/20 1200  vancomycin (VANCOREADY) IVPB 750 mg/150 mL  Status:  Discontinued        750 mg 150 mL/hr over 60 Minutes Intravenous Every 12 hours 04/24/20 0156 04/25/20 0930   04/24/20 0800  piperacillin-tazobactam (ZOSYN) IVPB 3.375 g  Status:  Discontinued        3.375 g 12.5 mL/hr over 240 Minutes Intravenous Every 8 hours 04/24/20 0156 04/24/20 1423   04/23/20 2215  piperacillin-tazobactam (ZOSYN) IVPB 3.375 g        3.375 g 100 mL/hr over 30 Minutes Intravenous  Once 04/23/20 2202 04/24/20 0031   04/23/20 2215  vancomycin (VANCOREADY) IVPB 1250 mg/250 mL        1,250 mg 166.7 mL/hr over 90 Minutes Intravenous  Once 04/23/20 2202 04/24/20 0101      Subjective: Seen and examined.  Feels much better.  No abdominal pain.  No nausea.  Tolerating soft diet.  No other complaint.  Objective: Vitals:   05/03/20 2000 05/04/20 0400 05/04/20 0836 05/04/20 1200  BP: 125/74 126/82 128/74 126/86  Pulse:  98 93 (!) 105  Resp: 20 18 18 18   Temp: 98.4 F (36.9 C) 98.3 F (36.8 C) 97.9 F (36.6 C) 98.5 F (36.9 C)  TempSrc: Oral Oral Oral Oral  SpO2:   95% 98%  Weight:      Height:        Intake/Output Summary (Last 24 hours) at 05/04/2020 1326 Last data filed at 05/04/2020 1205 Gross per 24 hour  Intake 263 ml  Output 2000 ml  Net -1737 ml   Filed Weights   04/23/20 1332  Weight: 62.6 kg    Examination: General exam: Appears calm and comfortable  Respiratory system: Clear to auscultation. Respiratory effort normal. Cardiovascular system: S1 & S2 heard, RRR. No JVD, murmurs, rubs, gallops or clicks. No pedal edema. Gastrointestinal system: Abdomen is nondistended, soft and nontender. No organomegaly or masses felt. Normal bowel sounds heard. Central nervous system: Alert and oriented. No focal neurological deficits. Extremities: Symmetric 5 x 5 power. Skin: No rashes, lesions or ulcers.   Psychiatry: Judgement and insight appear normal. Mood & affect appropriate.   Data Reviewed: I have personally reviewed following labs and imaging studies  CBC: Recent Labs  Lab 05/04/20 0630  WBC 8.6  NEUTROABS 6.6  HGB 8.1*  HCT 26.2*  MCV 86.2  PLT 604*   Basic Metabolic Panel: Recent Labs  Lab 04/29/20 0544 04/29/20 0544 04/30/20 0134 05/01/20 0532 05/02/20 0500 05/03/20 0551 05/04/20 0630  NA 136   < > 136 136 134* 138 137  K 3.9   < > 3.8 3.6 4.3 3.5 3.3*  CL 102   < > 102 103 101 101 100  CO2 22   < > 25 24 22 24 26   GLUCOSE 109*   < > 102* 128* 119* 122* 119*  BUN 5*   < > <5* 6 <5* <5* <5*  CREATININE 0.57   < > 0.55 0.53 0.55 0.60 0.55  CALCIUM 8.3*   < > 8.2* 8.4* 8.7* 9.0 8.7*  MG 1.8  --  1.6* 1.8 1.8 1.9  --    < > = values in this interval not displayed.   GFR: Estimated Creatinine Clearance: 65.4 mL/min (by C-G formula based on SCr of 0.55 mg/dL). Liver Function Tests: Recent  Labs  Lab 04/30/20 0134 05/01/20 0532 05/02/20 0500 05/03/20 0551 05/04/20 0630  AST 29 50* 36 39 32  ALT 10 15 13 12 10   ALKPHOS 86 132* 139* 126 107  BILITOT 0.7 0.7 1.0 0.8 0.6  PROT 6.1* 6.6 7.0 7.2 6.6  ALBUMIN 1.6* 1.7* 1.8* 1.8* 1.7*   Recent Labs  Lab 04/30/20 0134 05/01/20 0532 05/02/20 0500 05/03/20 0551 05/04/20 0630  LIPASE 137* 127* 106* 96* 91*   No results for input(s): AMMONIA in the last 168 hours. Coagulation Profile: No results for input(s): INR, PROTIME in the last 168 hours. Cardiac Enzymes: No results for input(s): CKTOTAL, CKMB, CKMBINDEX, TROPONINI in the last 168 hours. BNP (last 3 results) No results for input(s): PROBNP in the last 8760 hours. HbA1C: No results for input(s): HGBA1C in the last 72 hours. CBG: No results for input(s): GLUCAP in the last 168 hours. Lipid Profile: No results for input(s): CHOL, HDL, LDLCALC, TRIG, CHOLHDL, LDLDIRECT in the last 72 hours. Thyroid Function Tests: No results for input(s): TSH,  T4TOTAL, FREET4, T3FREE, THYROIDAB in the last 72 hours. Anemia Panel: No results for input(s): VITAMINB12, FOLATE, FERRITIN, TIBC, IRON, RETICCTPCT in the last 72 hours. Sepsis Labs: Recent Labs  Lab 04/29/20 1813 04/30/20 0134  LATICACIDVEN 0.7 0.8    Recent Results (from the past 240 hour(s))  Culture, blood (routine x 2)     Status: None   Collection Time: 04/25/20  7:08 AM   Specimen: BLOOD  Result Value Ref Range Status   Specimen Description BLOOD RIGHT ANTECUBITAL  Final   Special Requests   Final    BOTTLES DRAWN AEROBIC AND ANAEROBIC Blood Culture adequate volume   Culture   Final    NO GROWTH 5 DAYS Performed at [facility], [address] [place], [place]    Report Status 04/30/2020 FINAL  Final  Culture, blood (routine x 2)     Status: None   Collection Time: 04/25/20  7:14 AM   Specimen: BLOOD  Result Value Ref Range Status   Specimen Description BLOOD LEFT ANTECUBITAL  Final   Special Requests   Final    BOTTLES DRAWN AEROBIC AND ANAEROBIC Blood Culture adequate volume   Culture   Final    NO GROWTH 5 DAYS Performed at [facility], [address], [place], [place]    Report Status 04/30/2020 FINAL  Final  Surgical PCR screen     Status: None   Collection Time: 04/25/20  7:54 AM   Specimen: Nasal Mucosa; Nasal Swab  Result Value Ref Range Status   MRSA, PCR NEGATIVE NEGATIVE Final   Staphylococcus aureus NEGATIVE NEGATIVE Final    Comment: (NOTE) The Xpert SA Assay (FDA approved for NASAL specimens in patients 22 years of age and older), is one component of a comprehensive surveillance program. It is not intended to diagnose infection nor to guide or monitor treatment. Performed at [facility], [address] [place], [place]   Aerobic/Anaerobic Culture (surgical/deep wound)     Status: None   Collection Time: 04/25/20  4:44 PM   Specimen: Wound  Result Value Ref Range Status   Specimen  Description WOUND  Final   Special Requests RT ACROMIOCLAVICULAR JOINT  Final   Gram Stain   Final    FEW WBC PRESENT, PREDOMINANTLY PMN NO ORGANISMS SEEN    Culture   Final    RARE STAPHYLOCOCCUS AUREUS NO ANAEROBES ISOLATED Performed at  [facility], [address] [place], [place] [address]    Report Status 04/30/2020 FINAL  Final   Organism ID, Bacteria STAPHYLOCOCCUS AUREUS  Final      Susceptibility   Staphylococcus aureus - MIC*    CIPROFLOXACIN <=0.5 SENSITIVE Sensitive     ERYTHROMYCIN <=0.25 SENSITIVE Sensitive     GENTAMICIN <=0.5 SENSITIVE Sensitive     OXACILLIN <=0.25 SENSITIVE Sensitive     TETRACYCLINE <=1 SENSITIVE Sensitive     VANCOMYCIN <=0.5 SENSITIVE Sensitive     TRIMETH/SULFA <=10 SENSITIVE Sensitive     CLINDAMYCIN <=0.25 SENSITIVE Sensitive     RIFAMPIN <=0.5 SENSITIVE Sensitive     Inducible Clindamycin NEGATIVE Sensitive     * RARE STAPHYLOCOCCUS AUREUS     Radiology Studies: No results found.  Scheduled Meds: . amLODipine  5 mg Oral Daily  . Chlorhexidine Gluconate Cloth  6 each Topical Daily  . docusate sodium  100 mg Oral BID  . feeding supplement (ENSURE ENLIVE)  237 mL Oral BID BM  . lisinopril  10 mg Oral Daily  . nutrition supplement (JUVEN)  1 packet Oral BID BM  . oxyCODONE  15 mg Oral Q12H  . pantoprazole  40 mg Oral QHS  . sodium chloride flush  10-40 mL Intracatheter Q12H  . sodium chloride flush  3 mL Intravenous Q12H   Continuous Infusions: . sodium chloride    . sodium chloride    .  ceFAZolin (ANCEF) IV 2 g (05/04/20 1307)  . lactated ringers 100 mL/hr at 05/04/20 0539  . methocarbamol (ROBAXIN) IV 500 mg (04/28/20 0349)     LOS: 11 days   Hughie Closs, MD [facility] Pager On Amion  If 7PM-7AM, please contact night-coverage 05/04/2020, 1:26 PM

## 2020-05-04 NOTE — Progress Notes (Addendum)
Physical Therapy Treatment Patient Details Name: Debra Schmidt MRN: [id number] DOB: 06/08/1961 Today's Date: 05/04/2020    History of Present Illness Pt is a 59 y.o. female admitted on 04/23/20 for emergent decompression of throacolumbar epidural abcess, discitis L1/2 s/p laminectomy L1/2 and evacuation of abcess.  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 She is now s/p I&D and shoulder, AC joint repair. Infectious disease is following suspicious that both R shoulder and back are staph infections.  Blood cultures are pending.  Pt with no significant medical history listed in chart.    PT Comments    Pt received in bed, agreeable to therapy. She required mod assist bed mobility, mod assist sit to stand, and mod assist ambulation 5' with RW. +2 safety required for ambulation. Distance limited by pain and weakness. Bilat knee buckling noted during amb requiring mod assist to maintain stance. Pt/husband declining CIR due to need for private pay with plan for home. Discharge recommendation updated to HHPT and 24-hour assist.    Follow Up Recommendations  Home health PT;Supervision/Assistance - 24 hour     Equipment Recommendations  3in1 (PT);Wheelchair (measurements PT);Wheelchair cushion (measurements PT);Rolling walker with 5" wheels    Recommendations for Other Services       Precautions / Restrictions Precautions Precautions: Fall;Back Precaution Comments: reviewed 3/3 back precautions Required Braces or Orthoses: Spinal Brace Spinal Brace: Lumbar corset;Applied in sitting position Restrictions RUE Weight Bearing: Weight bearing as tolerated Other Position/Activity Restrictions: no overhead movement    Mobility  Bed Mobility Overal bed mobility: Needs Assistance Bed Mobility: Rolling;Sidelying to Sit Rolling: Min assist Sidelying to sit: Mod assist;HOB elevated       General bed mobility comments: cues for sequencing and precautions,  +rail, assist with BLE off bed and to elevate trunk  Transfers Overall transfer level: Needs assistance Equipment used: Rolling walker (2 wheeled) Transfers: Sit to/from Stand Sit to Stand: Mod assist;From elevated surface         General transfer comment: cues for hand placement, assist to power up and stabilize balance  Ambulation/Gait Ambulation/Gait assistance: Mod assist;+2 safety/equipment Gait Distance (Feet): 5 Feet Assistive device: Rolling walker (2 wheeled) Gait Pattern/deviations: Step-through pattern;Decreased stride length Gait velocity: decreased Gait velocity interpretation: <1.8 ft/sec, indicate of risk for recurrent falls General Gait Details: bilat knee buckling requiring mod assist to recover. Distance limited by pain and weakness.   Stairs             Wheelchair Mobility    Modified Rankin (Stroke Patients Only)       Balance Overall balance assessment: Needs assistance Sitting-balance support: Feet supported;Single extremity supported Sitting balance-Leahy Scale: Fair     Standing balance support: Bilateral upper extremity supported;During functional activity Standing balance-Leahy Scale: Poor Standing balance comment: dependent on RW                            Cognition Arousal/Alertness: Awake/alert Behavior During Therapy: Flat affect;Impulsive Overall Cognitive Status: No family/caregiver present to determine baseline cognitive functioning Area of Impairment: Memory;Safety/judgement;Following commands;Awareness;Problem solving;Attention                   Current Attention Level: Sustained Memory: Decreased recall of precautions;Decreased short-term memory Following Commands: Follows one step commands with increased time Safety/Judgement: Decreased awareness of safety;Decreased awareness of deficits Awareness: Emergent Problem Solving: Slow processing;Difficulty sequencing;Requires verbal cues;Requires tactile  cues  Exercises      General Comments        Pertinent Vitals/Pain Pain Assessment: 0-10 Pain Score: 8  Pain Location: back Pain Descriptors / Indicators: Cramping;Spasm Pain Intervention(s): Monitored during session;Limited activity within patient's tolerance;Repositioned;Patient requesting pain meds-RN notified    Home Living                      Prior Function            PT Goals (current goals can now be found in the care plan section) Acute Rehab PT Goals Patient Stated Goal: retun to independence Progress towards PT goals: Progressing toward goals    Frequency    Min 5X/week      PT Plan Equipment recommendations need to be updated    Co-evaluation              AM-PAC PT "6 Clicks" Mobility   Outcome Measure  Help needed turning from your back to your side while in a flat bed without using bedrails?: A Little Help needed moving from lying on your back to sitting on the side of a flat bed without using bedrails?: A Little Help needed moving to and from a bed to a chair (including a wheelchair)?: A Lot Help needed standing up from a chair using your arms (e.g., wheelchair or bedside chair)?: A Lot Help needed to walk in hospital room?: A Lot Help needed climbing 3-5 steps with a railing? : Total 6 Click Score: 13    End of Session Equipment Utilized During Treatment: Gait belt;Back brace Activity Tolerance: Patient limited by pain Patient left: in chair;with call bell/phone within reach;with chair alarm set Nurse Communication: Mobility status PT Visit Diagnosis: Muscle weakness (generalized) (M62.81);Difficulty in walking, not elsewhere classified (R26.2);Pain;Other symptoms and signs involving the nervous system (Q00.867)     Time: 6195-0932 PT Time Calculation (min) (ACUTE ONLY): 24 min  Charges:  $Gait Training: 23-37 mins                     Aida Raider, [place]  Office # [phone] Pager [phone]    Ilda Foil 05/04/2020, 10:59 AM

## 2020-05-04 NOTE — Progress Notes (Signed)
[facility] Progress Note  Debra Schmidt 59 y.o. 1961-06-07  CC:  Pancreatitis  Subjective: Reports feeling well today.  States she tolerated oatmeal and juice for breakfast.  Denies any abdominal pain, nausea, or vomiting.  Has some lower back pain today.  ROS : Review of Systems  Gastrointestinal: Negative for abdominal pain, blood in stool, constipation, diarrhea, heartburn, melena, nausea and vomiting.  Musculoskeletal: Positive for back pain (lower). Negative for neck pain.    Objective: Vital signs in last 24 hours: Vitals:   05/04/20 0836 05/04/20 1200  BP: 128/74 126/86  Pulse: 93 (!) 105  Resp: 18 18  Temp: 97.9 F (36.6 C) 98.5 F (36.9 C)  SpO2: 95% 98%    Physical Exam:  General:  Alert, cooperative, no acute distress, sitting in chair comfortably  Head:  Normocephalic, without obvious abnormality, atraumatic  Eyes:  Anicteric sclera, EOMs intact  Lungs:   Clear to auscultation bilaterally, respirations unlabored  Heart:  Regular rate and rhythm, S1, S2 normal  Abdomen:   Soft, mild distention, mild diffuse tenderness, bowel sounds active all four quadrants,  no guarding or peritoneal signs  Extremities: Extremities normal, atraumatic, no  edema  Pulses: 2+ and symmetric    Lab Results: Recent Labs    05/02/20 0500 05/02/20 0500 05/03/20 0551 05/04/20 0630  NA 134*   < > 138 137  K 4.3   < > 3.5 3.3*  CL 101   < > 101 100  CO2 22   < > 24 26  GLUCOSE 119*   < > 122* 119*  BUN <5*   < > <5* <5*  CREATININE 0.55   < > 0.60 0.55  CALCIUM 8.7*   < > 9.0 8.7*  MG 1.8  --  1.9  --    < > = values in this interval not displayed.   Recent Labs    05/03/20 0551 05/04/20 0630  AST 39 32  ALT 12 10  ALKPHOS 126 107  BILITOT 0.8 0.6  PROT 7.2 6.6  ALBUMIN 1.8* 1.7*   Recent Labs    05/04/20 0630  WBC 8.6  NEUTROABS 6.6  HGB 8.1*  HCT 26.2*  MCV 86.2  PLT 604*   No results for input(s): LABPROT, INR in the last 72  hours.   Assessment: Acute on chronic pancreatitis, resolving, in the setting of pancreas divisum and cholelithiasis -Lipase continues to decrease, now 91 as compared to 96 yesterday and 106 two days ago -LFTs normal: T. Bili 0.6/ AST 32/ ALT 10/ ALP 107 -Renal function remains normal: BUN <5, Cr 0.55  Plan: -Continue low fat diet with small portions -Outpatient follow up with [facility] to discuss lap chole  [facility] will sign off.  Please contact us if we can be of any further assistance during this hospital stay.  Edrick Kins PA-C 05/04/2020, 2:44 PM  Contact #  [phone]

## 2020-05-04 NOTE — Progress Notes (Addendum)
Occupational Therapy Treatment Patient Details Name: Debra Schmidt MRN: [id number] DOB: Dec 18, 1960 Today's Date: 05/04/2020    History of present illness Pt is a 59 y.o. female admitted on 04/23/20 for emergent decompression of throacolumbar epidural abcess, discitis L1/2 s/p laminectomy L1/2 and evacuation of abcess.  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 She is now s/p I&D and shoulder, AC joint repair. Infectious disease is following suspicious that both R shoulder and back are staph infections.  Blood cultures are pending.  Pt with no significant medical history listed in chart.   OT comments  Patient continues to make steady progress towards goals in skilled OT session. Patient's session encompassed reiteration of back precautions, assessment of home set-up and functional mobility. Pt is unable to recall precautions, and was twisted and slumped in the char upon arrival. When prompted with regard to her home set-up, pt requested to draw therapist a picture (nonsensical in appearance) but therapist was able to glean that she will have assist at home (would greatly benefit from home education with family prior to discharge). Pt was unable to sequence steps to stand-pivot back to bed without max A, and despite education of how to return to bed adhering to back precautions, pt flopped back in the bed. Next session to focus on family education to promote safe discharge; will continue to follow acutely.    Follow Up Recommendations  Home health OT;Supervision/Assistance - 24 hour    Equipment Recommendations  3 in 1 bedside commode;Tub/shower bench    Recommendations for Other Services      Precautions / Restrictions Precautions Precautions: Fall;Back Precaution Comments: Unable to recite back precautions 0/3 Required Braces or Orthoses: Spinal Brace Spinal Brace: Lumbar corset;Applied in sitting position Restrictions Weight Bearing Restrictions:  Yes RUE Weight Bearing: Weight bearing as tolerated Other Position/Activity Restrictions: no overhead movement       Mobility Bed Mobility Overal bed mobility: Needs Assistance Bed Mobility: Rolling;Sit to Sidelying Rolling: Min assist       Sit to sidelying: Max assist General bed mobility comments: cues for sequencing and precautions,  Transfers Overall transfer level: Needs assistance Equipment used: Rolling walker (2 wheeled) Transfers: Sit to/from Stand Sit to Stand: Mod assist;+2 physical assistance;Max assist         General transfer comment: cues for hand placement, assist to power up and stabilize balance, unable to advance feet or walker without max A to stand pivot back to bed    Balance Overall balance assessment: Needs assistance Sitting-balance support: Feet supported;Single extremity supported Sitting balance-Leahy Scale: Fair   Postural control: Right lateral lean Standing balance support: Bilateral upper extremity supported;During functional activity Standing balance-Leahy Scale: Poor Standing balance comment: dependent on RW                           ADL either performed or assessed with clinical judgement   ADL Overall ADL's : Needs assistance/impaired                         Toilet Transfer: +2 for physical assistance;+2 for safety/equipment;Ambulation;Moderate assistance Toilet Transfer Details (indicate cue type and reason): simulated via functional mobility; Mod A +2 for functional mobility with pt needing max cues to sequence BOS and initiate pivotal steps, pt greatly limited by pain in low back. Unable to advance RW and sequence to date  Functional mobility during ADLs: +2 for safety/equipment;+2 for physical assistance;Cueing for sequencing;Cueing for safety;Moderate assistance General ADL Comments: pt continues to present with pain, back precautions and cogntivie deficits impacting pts ability to engage in  BADLs     Vision       Perception     Praxis      Cognition Arousal/Alertness: Awake/alert Behavior During Therapy: [facility] for tasks assessed/performed Overall Cognitive Status: No family/caregiver present to determine baseline cognitive functioning Area of Impairment: Memory;Safety/judgement;Following commands;Awareness;Problem solving;Attention                   Current Attention Level: Sustained Memory: Decreased recall of precautions;Decreased short-term memory Following Commands: Follows one step commands with increased time Safety/Judgement: Decreased awareness of safety;Decreased awareness of deficits Awareness: Emergent Problem Solving: Slow processing;Difficulty sequencing;Requires verbal cues;Requires tactile cues General Comments: pt with no recall of precautions, pt with memory deficits and when asking about home setup, pt drew therapist a nonsensical drawing        Exercises     Shoulder Instructions       General Comments      Pertinent Vitals/ Pain       Pain Assessment: Faces Faces Pain Scale: Hurts whole lot Pain Location: back Pain Descriptors / Indicators: Cramping;Spasm Pain Intervention(s): Limited activity within patient's tolerance;Monitored during session;Repositioned  Home Living                                          Prior Functioning/Environment              Frequency  Min 2X/week        Progress Toward Goals  OT Goals(current goals can now be found in the care plan section)  Progress towards OT goals: Progressing toward goals  Acute Rehab OT Goals Patient Stated Goal: retun to independence OT Goal Formulation: With patient Time For Goal Achievement: 05/10/20 Potential to Achieve Goals: [place] Discharge plan needs to be updated    Co-evaluation                 AM-PAC OT "6 Clicks" Daily Activity     Outcome Measure   Help from another person eating meals?: None Help from another  person taking care of personal grooming?: A Little Help from another person toileting, which includes using toliet, bedpan, or urinal?: A Lot Help from another person bathing (including washing, rinsing, drying)?: A Lot Help from another person to put on and taking off regular upper body clothing?: A Little Help from another person to put on and taking off regular lower body clothing?: A Lot 6 Click Score: 16    End of Session Equipment Utilized During Treatment: Gait belt;Back brace  OT Visit Diagnosis: Unsteadiness on feet (R26.81);Other abnormalities of gait and mobility (R26.89);Muscle weakness (generalized) (M62.81);Pain   Activity Tolerance Patient tolerated treatment well   Patient Left in bed;with bed alarm set;with call bell/phone within reach   Nurse Communication Mobility status        Time: 2992-4268 OT Time Calculation (min): 26 min  Charges: OT General Charges $OT Visit: 1 Visit OT Treatments $Self Care/Home Management : 23-37 mins  [place]. Airi Copado, COTA/L Acute Rehabilitation Services Tower 05/04/2020, 3:30 PM

## 2020-05-04 NOTE — Progress Notes (Signed)
Nutrition Follow-up  DOCUMENTATION CODES:   Not applicable  INTERVENTION:   -D/c Ensure Enlive po BID, each supplement provides 350 kcal and 20 grams of protein -Continue 1 packet Juven BID, each packet provides 95 calories, 2.5 grams of protein (collagen), and 9.8 grams of carbohydrate (3 grams sugar); also contains 7 grams of L-arginine and L-glutamine, 300 mg vitamin C, 15 mg vitamin E, 1.2 mcg vitamin B-12, 9.5 mg zinc, 200 mg calcium, and 1.5 g  Calcium Beta-hydroxy-Beta-methylbutyrate to support wound healing -Magic cup TID with meals, each supplement provides 290 kcal and 9 grams of protein  NUTRITION DIAGNOSIS:   Inadequate oral intake related to decreased appetite as evidenced by per patient/family report.  Ongoing  GOAL:   Patient will meet greater than or equal to 90% of their needs  Progressing   MONITOR:   Labs, I & O's, Supplement acceptance, Weight trends, PO intake, Skin  REASON FOR ASSESSMENT:   Consult Assessment of nutrition requirement/status  ASSESSMENT:   59 year old female admitted on 04/23/20 for MSSA bacteremia with past medical history of drug abuse presented with 3 week history of lower back pain which later progressed to lower extremities and wound to right shoulder with purulent discharge and erythema secondary to a fall while in the shower. 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  Reviewed I/O's: -1.5 L x 24 hours and -14.4 L since admission  UOP: 1.5 L x 24 hours  Per GI notes, pt with evidence of chronic pancreatitis secondary to pancreas divisum.  Spoke with pt, who was sitting in recliner chair at time of visit. She reports feeling much better and her appetite has improved significantly since last week. Noted pt consumed 100% of breakfast and lunch tray was recently delivered. Pt estimates that she has been consuming at least 50% of her meals over the past few days. Pt in very good  spirits, reporting she is going home soon (family will be assisting with care).   Per MAR, pt is refusing Ensure supplements.   Visit cut short due to pt speaking on phone throughout visit.   Labs reviewed.   NUTRITION - FOCUSED PHYSICAL EXAM:    Most Recent Value  Orbital Region No depletion  Upper Arm Region No depletion  Thoracic and Lumbar Region No depletion  Buccal Region No depletion  Temple Region No depletion  Clavicle Bone Region No depletion  Clavicle and Acromion Bone Region No depletion  Scapular Bone Region No depletion  Dorsal Hand No depletion  Patellar Region No depletion  Anterior Thigh Region No depletion  Posterior Calf Region No depletion  Edema (RD Assessment) None  Hair Reviewed  Eyes Reviewed  Mouth Reviewed  Skin Reviewed  Nails Reviewed       Diet Order:   Diet Order            Diet Heart Room service appropriate? Yes; Fluid consistency: Thin  Diet effective now                 EDUCATION NEEDS:   Education needs have been addressed  Skin:  Skin Assessment: Skin Integrity Issues: Skin Integrity Issues:: Incisions Incisions: closed; mid back;right shoulder  Last BM:  pta  Height:   Ht Readings from Last 1 Encounters:  04/23/20 5\' 4"  (1.626 m)    Weight:   Wt Readings from Last 1 Encounters:  04/23/20 62.6 kg   BMI:  Body mass index is 23.69 kg/m.  Estimated  Nutritional Needs:   Kcal:  1900-2100  Protein:  95-105  Fluid:  >/= 1.9 L/day    Levada Schilling, RD, LDN, CDCES Registered Dietitian II Certified Diabetes Care and Education Specialist Please refer to [facility] for RD and/or RD on-call/weekend/after hours pager

## 2020-05-04 NOTE — TOC Progression Note (Signed)
Transition of Care (TOC) - Progression Note  Donn Pierini RN,BSN Transitions of Care Unit 4NP (non trauma) - RN Case Manager [phone]   Patient Details  Name: Debra Schmidt MRN: [id number] Date of Birth: 1961/04/04  Transition of Care [facility]) CM/SW Contact  Zenda Alpers, Lenn Sink, RN Phone Number: 05/04/2020, 12:20 PM  Clinical Narrative:    F/u regarding HH arrangements- [facility] has reached out to pt/spouse to see if pt will qualify for charity HHPT- however per Britney with [facility]- pt does not meet the qualifying guidelines for [facility].Marland Kitchen  HHRN for IV abx/PICC line care needs will be provided under letter of guarantee (LOG) with [facility] in coordination with [facility])- this has been arranged under the direction of TOC leadership. Pam with [facility] will come provide bedside teaching with pt and daughter tomorrow 6/25- still need orders placed for [facility] for home iv/Picc line needs  Pt also needs DME orders placed for RW and 3n1 - will call [employer] for DME needs when orders placed.  If MD medically clears pt for transition home- Pt should be ready for discharge on Friday 6/25 after home IV abx education completed-  HH and DME needs have been arranged pending orders.    Expected Discharge Plan: IP Rehab Facility Barriers to Discharge: Continued Medical Work up  Expected Discharge Plan and Services Expected Discharge Plan: IP Rehab Facility   Discharge Planning Services: CM Consult, Medication Assistance, Indigent Health Clinic, Follow-up appt scheduled   Living arrangements for the past 2 months: Single Family Home                 DME Arranged: 3-N-1, Walker rolling DME Agency: [facility]       HH Arranged: RN, IV Antibiotics, PT           Social Determinants of Health (SDOH) Interventions    Readmission Risk Interventions No flowsheet data found.

## 2020-05-05 LAB — BASIC METABOLIC PANEL
Anion gap: 12 (ref 5–15)
BUN: 7 mg/dL (ref 6–20)
CO2: 25 mmol/L (ref 22–32)
Calcium: 8.7 mg/dL — ABNORMAL LOW (ref 8.9–10.3)
Chloride: 102 mmol/L (ref 98–111)
Creatinine, Ser: 0.43 mg/dL — ABNORMAL LOW (ref 0.44–1.00)
GFR calc Af Amer: >60 mL/min (ref >60)
GFR calc non Af Amer: >60 mL/min (ref >60)
Glucose, Bld: 105 mg/dL — ABNORMAL HIGH (ref 70–99)
Potassium: 3.3 mmol/L — ABNORMAL LOW (ref 3.5–5.1)
Sodium: 139 mmol/L (ref 135–145)

## 2020-05-05 LAB — MAGNESIUM: Magnesium: 1.8 mg/dL (ref 1.7–2.4)

## 2020-05-05 MED ORDER — POTASSIUM CHLORIDE CRYS ER 20 MEQ PO TBCR
40.0000 meq | EXTENDED_RELEASE_TABLET | Freq: Every day | ORAL | Status: DC
  Administered 2020-05-05: 40 meq via ORAL
  Filled 2020-05-05: qty 2

## 2020-05-05 MED ORDER — MAGNESIUM OXIDE 400 (241.3 MG) MG PO TABS
200.0000 mg | ORAL_TABLET | Freq: Two times a day (BID) | ORAL | Status: DC
  Administered 2020-05-05 – 2020-05-09 (×9): 200 mg via ORAL
  Filled 2020-05-05 (×9): qty 1

## 2020-05-05 MED ORDER — TAMSULOSIN HCL 0.4 MG PO CAPS
0.4000 mg | ORAL_CAPSULE | Freq: Every day | ORAL | Status: DC
  Administered 2020-05-05 – 2020-05-09 (×5): 0.4 mg via ORAL
  Filled 2020-05-05 (×5): qty 1

## 2020-05-05 NOTE — Progress Notes (Signed)
PROGRESS NOTE    Debra Schmidt  RFX:[id number] DOB: 11/15/1960 DOA: 04/23/2020 PCP: Patient, No Pcp Per    Brief Narrative:  59 y.o.female with remote history of Xanax and hydrocodone use presented to the ED 6/13 , she states that she fell in the bathtub several weeks ago injuring her right shoulder, eventually developed a draining wound on the top of her shoulder. She began to have progressively more severe back pain left urinary incontinence leading to admission 6/13. MRI showed L1-2 discitis with thoracolumbar abscess. She underwent emergent surgery by Dr. 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  Assessment & Plan:   Principal Problem:   MSSA bacteremia Active Problems:   Abscess in epidural space of lumbar spine   Hypokalemia   Hypomagnesemia   Status post surgery   Septic arthritis of right acromioclavicular joint (HCC)   Osteomyelitis of clavicle (HCC)   Cigarette smoker    MSSA bacteremia Epidural abscess with cauda equina Right shoulder septic arthritis and clavicular osteomyelitis infectious disease following -Antibiotics per ID recommendations, plan for minimum or 6 weeks of ancef,  ID to f/u in clinic next month -Underwent L1/2 laminectomy, epidural abscess evacuation on 6/13 -Pt underwent I&D and distal clavicle resection 6/15 per ortho.  Management per ID and neurosurgery.  Chronic hypokalemia and hypomagnesemia -Etiology not clear although albumin is <2. Suspect marked malnutrition in the setting of chronic pancreatitis prior to admit. Have consulted dietitian -Chart reviewed. Pt has hx of profound hypokalemia, first documented on 02/24/20 with a K of 2.7 -random cortisol level normal -Pt has required multiple doses of potassium replacement   After multiple doses of potassium and magnesium replacement (have calculated 1,058mEq of potassium given since  presentation until 05/03/2020).  Potassium low again today.  Starting on daily dose of 40 mEq p.o. daily.  Acute on chronic anemia -There is no CBC done on this patient since 04/26/2020.  Assuming care of this patient today.  Will check CBC tomorrow.  Hypertension -BP trends somewhat improved since starting lisinopril -Cont to titrate BP meds accordingly, stable presently   Tachycardia with tachypnea -Patient attributes to post-op pain. Patient reports marked pain, described as cramping, corresponding to times of documented tachycardia -CTA chest obtained and reviewed, neg for PE, however with incidental finding of acute on chronic pancreatitis, see below -VS have since improved with IVF hydration, see below  Acute on chronic pancreatitis/ pancreatic divisum -Noted incidentally on CTA chest  -Lipid panel unremarkable -RUQ [place] ordered and reviewed. Finding of 1.2cm dilated CBD with GB sludge.  GI on board and managing.  Advanced her diet to low-fat diet/soft diet. Seen by general surgery.  They recommended outpatient follow-up for laparoscopic cholecystectomy.  Pancreatitis has resolved now.  DVT prophylaxis: SCD's Code Status: Full Family Communication: Pt in room, family not at bedside  Consultants:   Orthopedic surgery  ID  TRH hospitalist  GI  Procedures:  PRE-OPERATIVE DIAGNOSIS: Epidural Mass  POST-OPERATIVE DIAGNOSIS: Epidural Abscess, L1/2 discitis  PROCEDURE: Procedure(s): Lumbar One-Two LAMINECTOMY for Epidural Abscess Evacuation  SURGEON:Surgeon(s):  Coletta Memos, MD   Antimicrobials: Anti-infectives (From admission, onward)   Start     Dose/Rate Route Frequency Ordered Stop   04/25/20 1641  vancomycin (VANCOCIN) powder  Status:  Discontinued          As needed 04/25/20 1641 04/25/20 1657   04/25/20 1200  ceFAZolin (ANCEF) IVPB 2g/100 mL premix  Discontinue     2 g 200 mL/hr over 30 Minutes Intravenous Every 8 hours 04/25/20 0930 06/03/20 2359    04/24/20 1200  vancomycin (VANCOREADY) IVPB 750 mg/150 mL  Status:  Discontinued        750 mg 150 mL/hr over 60 Minutes Intravenous Every 12 hours 04/24/20 0156 04/25/20 0930   04/24/20 0800  piperacillin-tazobactam (ZOSYN) IVPB 3.375 g  Status:  Discontinued        3.375 g 12.5 mL/hr over 240 Minutes Intravenous Every 8 hours 04/24/20 0156 04/24/20 1423   04/23/20 2215  piperacillin-tazobactam (ZOSYN) IVPB 3.375 g        3.375 g 100 mL/hr over 30 Minutes Intravenous  Once 04/23/20 2202 04/24/20 0031   04/23/20 2215  vancomycin (VANCOREADY) IVPB 1250 mg/250 mL        1,250 mg 166.7 mL/hr over 90 Minutes Intravenous  Once 04/23/20 2202 04/24/20 0101      Subjective: Seen and examined.  Feels much better.  No complaints.  Objective: Vitals:   05/04/20 2318 05/05/20 0334 05/05/20 0839 05/05/20 1143  BP: 113/72 (!) 147/83 124/78 121/80  Pulse: 88 (!) 105 92 (!) 108  Resp: 18 19 13 17   Temp: 97.6 F (36.4 C) 98.1 F (36.7 C) 98.3 F (36.8 C) 98.6 F (37 C)  TempSrc: Oral Oral  Oral  SpO2: 91% 100% 95% 100%  Weight:      Height:        Intake/Output Summary (Last 24 hours) at 05/05/2020 1217 Last data filed at 05/05/2020 0345 Gross per 24 hour  Intake 133 ml  Output 2200 ml  Net -2067 ml   Filed Weights   04/23/20 1332  Weight: 62.6 kg    Examination: General exam: Appears calm and comfortable  Respiratory system: Clear to auscultation. Respiratory effort normal. Cardiovascular system: S1 & S2 heard, RRR. No JVD, murmurs, rubs, gallops or clicks. No pedal edema. Gastrointestinal system: Abdomen is nondistended, soft and nontender. No organomegaly or masses felt. Normal bowel sounds heard. Central nervous system: Alert and oriented. No focal neurological deficits. Skin: No rashes, lesions or ulcers.  Psychiatry: Judgement and insight appear normal. Mood & affect appropriate.   Data Reviewed: I have personally reviewed following labs and imaging  studies  CBC: Recent Labs  Lab 05/04/20 0630  WBC 8.6  NEUTROABS 6.6  HGB 8.1*  HCT 26.2*  MCV 86.2  PLT 086*   Basic Metabolic Panel: Recent Labs  Lab 04/30/20 0134 04/30/20 0134 05/01/20 0532 05/02/20 0500 05/03/20 0551 05/04/20 0630 05/05/20 0812  NA 136   < > 136 134* 138 137 139  K 3.8   < > 3.6 4.3 3.5 3.3* 3.3*  CL 102   < > 103 101 101 100 102  CO2 25   < > 24 22 24 26 25   GLUCOSE 102*   < > 128* 119* 122* 119* 105*  BUN <5*   < > 6 <5* <5* <5* 7  CREATININE 0.55   < > 0.53 0.55 0.60 0.55 0.43*  CALCIUM 8.2*   < > 8.4* 8.7* 9.0 8.7* 8.7*  MG 1.6*  --  1.8 1.8 1.9  --  1.8   < > = values in this interval not displayed.   GFR: Estimated Creatinine Clearance: 65.4 mL/min (A) (by C-G formula based on SCr of 0.43 mg/dL (L)). Liver Function Tests: Recent Labs  Lab 04/30/20 0134 05/01/20 0532 05/02/20 0500 05/03/20 0551 05/04/20 0630  AST 29 50* 36 39 32  ALT 10 15 13 12 10   ALKPHOS 86 132* 139* 126 107  BILITOT 0.7 0.7 1.0 0.8 0.6  PROT 6.1* 6.6 7.0 7.2 6.6  ALBUMIN 1.6* 1.7* 1.8* 1.8* 1.7*   Recent Labs  Lab 04/30/20 0134 05/01/20 0532 05/02/20 0500 05/03/20 0551 05/04/20 0630  LIPASE 137* 127* 106* 96* 91*   No results for input(s): AMMONIA in the last 168 hours. Coagulation Profile: No results for input(s): INR, PROTIME in the last 168 hours. Cardiac Enzymes: No results for input(s): CKTOTAL, CKMB, CKMBINDEX, TROPONINI in the last 168 hours. BNP (last 3 results) No results for input(s): PROBNP in the last 8760 hours. HbA1C: No results for input(s): HGBA1C in the last 72 hours. CBG: No results for input(s): GLUCAP in the last 168 hours. Lipid Profile: No results for input(s): CHOL, HDL, LDLCALC, TRIG, CHOLHDL, LDLDIRECT in the last 72 hours. Thyroid Function Tests: No results for input(s): TSH, T4TOTAL, FREET4, T3FREE, THYROIDAB in the last 72 hours. Anemia Panel: No results for input(s): VITAMINB12, FOLATE, FERRITIN, TIBC, IRON,  RETICCTPCT in the last 72 hours. Sepsis Labs: Recent Labs  Lab 04/29/20 1813 04/30/20 0134  LATICACIDVEN 0.7 0.8    Recent Results (from the past 240 hour(s))  Aerobic/Anaerobic Culture (surgical/deep wound)     Status: None   Collection Time: 04/25/20  4:44 PM   Specimen: Wound  Result Value Ref Range Status   Specimen Description WOUND  Final   Special Requests RT ACROMIOCLAVICULAR JOINT  Final   Gram Stain   Final    FEW WBC PRESENT, PREDOMINANTLY PMN NO ORGANISMS SEEN    Culture   Final    RARE STAPHYLOCOCCUS AUREUS NO ANAEROBES ISOLATED Performed at [facility], [address] [place], [place]    Report Status 04/30/2020 FINAL  Final   Organism ID, Bacteria STAPHYLOCOCCUS AUREUS  Final      Susceptibility   Staphylococcus aureus - MIC*    CIPROFLOXACIN <=0.5 SENSITIVE Sensitive     ERYTHROMYCIN <=0.25 SENSITIVE Sensitive     GENTAMICIN <=0.5 SENSITIVE Sensitive     OXACILLIN <=0.25 SENSITIVE Sensitive     TETRACYCLINE <=1 SENSITIVE Sensitive     VANCOMYCIN <=0.5 SENSITIVE Sensitive     TRIMETH/SULFA <=10 SENSITIVE Sensitive     CLINDAMYCIN <=0.25 SENSITIVE Sensitive     RIFAMPIN <=0.5 SENSITIVE Sensitive     Inducible Clindamycin NEGATIVE Sensitive     * RARE STAPHYLOCOCCUS AUREUS     Radiology Studies: No results found.  Scheduled Meds: . amLODipine  5 mg Oral Daily  . Chlorhexidine Gluconate Cloth  6 each Topical Daily  . docusate sodium  100 mg Oral BID  . lisinopril  10 mg Oral Daily  . magnesium oxide  200 mg Oral BID  . multivitamin with minerals  1 tablet Oral Daily  . nutrition supplement (JUVEN)  1 packet Oral BID BM  . oxyCODONE  15 mg Oral Q12H  . pantoprazole  40 mg Oral QHS  . potassium chloride  40 mEq Oral Daily  . sodium chloride flush  10-40 mL Intracatheter Q12H  . sodium chloride flush  3 mL Intravenous Q12H   Continuous Infusions: . sodium chloride    . sodium chloride    .  ceFAZolin (ANCEF) IV 2 g (05/05/20  0410)  . lactated ringers 100 mL/hr at 05/05/20 0407  . methocarbamol (ROBAXIN) IV 500 mg (04/28/20 0349)     LOS: 12 days   04/30/20, MD [facility] Pager On Amion  If 7PM-7AM, please contact night-coverage 05/05/2020, 12:17 PM

## 2020-05-05 NOTE — Plan of Care (Signed)

## 2020-05-05 NOTE — Progress Notes (Signed)
Foley catheter removed per order around 1930. Patient still had not voided after multiple attempts at 0230. Bladder scan performed; 613 mL in bladder. On-call MD notified and order received to straight catheterize patient q6h if unable to void. Straight catheterization performed with second RN per hospital policy. Return of 1000 mL clear yellow urine. Patient educated on plan going forward if she continues to not be able to void. Will continue to monitor.

## 2020-05-05 NOTE — Progress Notes (Signed)
Patient ID: Debra Schmidt, female   DOB: [birth year], 60 y.o.   MRN: [id number] BP (!) 148/92 (BP Location: Right Leg)   Pulse (!) 110   Temp 98.3 F (36.8 C)   Resp 16   Ht 5\' 4"  (1.626 m)   Wt 62.6 kg   SpO2 98%   BMI 23.69 kg/m  Alert and oriented x 4, speech is clear, fluent Moving all extremities well Lumbar wound is clean, dry Continue abx

## 2020-05-05 NOTE — Progress Notes (Signed)
   ORTHOPAEDIC PROGRESS NOTE  s/p Procedure(s): Open debridement R AC joint; Distal clavicle repair ACROMIO-CLAVICULAR JOINT REPAIR RESECTION DISTAL CLAVICAL on 04/25/2020 with Dr. Eulah Pont  SUBJECTIVE: Reports no pain in her right upper extremity. Wants to go back to sleep.  OBJECTIVE: PE: Right upper extremity: dressing CDI. Non tender to palpation. Actively moves RUE without pain. Neurovascularly intact.  Vitals:   05/05/20 0334 05/05/20 0839  BP: (!) 147/83 124/78  Pulse: (!) 105 92  Resp: 19 13  Temp: 98.1 F (36.7 C) 98.3 F (36.8 C)  SpO2: 100% 95%     ASSESSMENT: Debra Schmidt is a 59 y.o. female POD#10  PLAN: Weightbearing: WBAT RUE Insicional and dressing care: Reinforce dressings as needed Orthopedic device(s): None  Showering:  Keep dressing dry VTE prophylaxis: Per primary team Pain control: PRN pain medications. Minimize narcotics.  Follow - up plan: 2 weeks in office with Dr. Adora Fridge information:  Dr. Renaye Rakers, Alfonse Alpers PA-C  Infectious Disease following - Antibiotics per ID recommendations, plan for minimum or 6 weeks of ancef,  ID to f/u in clinic next month  Alfonse Alpers, PA-C 05/05/2020

## 2020-05-05 NOTE — Progress Notes (Signed)
Physical Therapy Treatment Patient Details Name: Debra Schmidt MRN: [id number] DOB: [birth year] Today's Date: 05/05/2020    History of Present Illness Pt is a 59 y.o. female admitted on 04/23/20 for emergent decompression of throacolumbar epidural abcess, discitis L1/2 s/p laminectomy L1/2 and evacuation of abcess.  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 She is now s/p I&D and shoulder, AC joint repair. Infectious disease is following suspicious that both R shoulder and back are staph infections.  Blood cultures are pending.  Pt with no significant medical history listed in chart.    PT Comments    Pt required mod assist transfers and mod assist ambulation 15' with RW. +2 needed for chair follow. Gait distance limited by pain. Pt continues to demonstrate poor safety awareness and non-adherence to back precautions. Poor carryover of education provided during therapy session. Plan is for discharge home. Pt/husband declining CIR/SNF due to private pay. PT to continue per POC.    Follow Up Recommendations  Home health PT;Supervision/Assistance - 24 hour     Equipment Recommendations  3in1 (PT);Wheelchair (measurements PT);Wheelchair cushion (measurements PT);Rolling walker with 5" wheels    Recommendations for Other Services       Precautions / Restrictions Precautions Precautions: Fall;Back Precaution Comments: reviewed 3/3 back precautions. Required Braces or Orthoses: Spinal Brace Spinal Brace: Lumbar corset;Applied in sitting position Restrictions Weight Bearing Restrictions: No RUE Weight Bearing: Weight bearing as tolerated Other Position/Activity Restrictions: no overhead movement RUE    Mobility  Bed Mobility               General bed mobility comments: Pt received OOB.  Transfers Overall transfer level: Needs assistance Equipment used: Rolling walker (2 wheeled) Transfers: Sit to/from [facility] Sit to  Stand: Mod assist Stand pivot transfers: +2 safety/equipment;Mod assist       General transfer comment: cues for hand placement and sequencing  Ambulation/Gait Ambulation/Gait assistance: Mod assist;+2 safety/equipment Gait Distance (Feet): 15 Feet Assistive device: Rolling walker (2 wheeled) Gait Pattern/deviations: Step-through pattern;Decreased stride length Gait velocity: decreased Gait velocity interpretation: <1.8 ft/sec, indicate of risk for recurrent falls General Gait Details: Cues for sequencing. Assist with RW management and to maintain balance. Pt declining further distance due to pain.   Stairs             Wheelchair Mobility    Modified Rankin (Stroke Patients Only)       Balance Overall balance assessment: Needs assistance Sitting-balance support: No upper extremity supported;Feet supported Sitting balance-Leahy Scale: Fair     Standing balance support: Bilateral upper extremity supported;During functional activity Standing balance-Leahy Scale: Poor Standing balance comment: dependent on RW                            Cognition Arousal/Alertness: Awake/alert Behavior During Therapy: WFL for tasks assessed/performed Overall Cognitive Status: No family/caregiver present to determine baseline cognitive functioning Area of Impairment: Memory;Safety/judgement;Following commands;Awareness;Problem solving;Attention                   Current Attention Level: Sustained Memory: Decreased recall of precautions;Decreased short-term memory Following Commands: Follows one step commands with increased time Safety/Judgement: Decreased awareness of safety;Decreased awareness of deficits Awareness: Emergent Problem Solving: Slow processing;Difficulty sequencing;Requires verbal cues;Requires tactile cues General Comments: No recall of precautions.      Exercises      General Comments        Pertinent Vitals/Pain Pain  Assessment:  0-10 Pain Score: 8  Pain Location: back Pain Descriptors / Indicators: Discomfort;Sore Pain Intervention(s): Limited activity within patient's tolerance;Repositioned    Home Living Family/patient expects to be discharged to:: Private residence Living Arrangements: Spouse/significant other                  Prior Function            PT Goals (current goals can now be found in the care plan section) Acute Rehab PT Goals Patient Stated Goal: retun to independence Progress towards PT goals: Progressing toward goals    Frequency    Min 5X/week      PT Plan Current plan remains appropriate    Co-evaluation              AM-PAC PT "6 Clicks" Mobility   Outcome Measure  Help needed turning from your back to your side while in a flat bed without using bedrails?: A Little Help needed moving from lying on your back to sitting on the side of a flat bed without using bedrails?: A Little Help needed moving to and from a bed to a chair (including a wheelchair)?: A Lot Help needed standing up from a chair using your arms (e.g., wheelchair or bedside chair)?: A Little Help needed to walk in hospital room?: A Lot Help needed climbing 3-5 steps with a railing? : A Lot 6 Click Score: 15    End of Session Equipment Utilized During Treatment: Gait belt;Back brace Activity Tolerance: Patient limited by pain Patient left: in chair;with call bell/phone within reach;with chair alarm set Nurse Communication: Mobility status PT Visit Diagnosis: Muscle weakness (generalized) (M62.81);Difficulty in walking, not elsewhere classified (R26.2);Pain;Other symptoms and signs involving the nervous system (R29.898)     Time: 0786-7544 PT Time Calculation (min) (ACUTE ONLY): 20 min  Charges:  $Gait Training: 8-22 mins                     Aida Raider, PT  Office # [phone] Pager [phone]    Ilda Foil 05/05/2020, 1:55 PM

## 2020-05-05 NOTE — Progress Notes (Signed)
Foley was discontinued on 05/04/20 and removed at around 19:39. Patient unable to void. Bladder scan performed and a total of 613 ml scanned. Neurosurgery notified, and Dr. Maisie Fus, prescribed for Straight in and out cath PRN if unable to void.

## 2020-05-05 NOTE — TOC Progression Note (Signed)
Transition of Care (TOC) - Progression Note  Donn Pierini RN,BSN Transitions of Care Unit 4NP (non trauma) - RN Case Manager [phone]   Patient Details  Name: Debra Schmidt MRN: [id number] Date of Birth: 02-Jul-1961  Transition of Care [facility]) CM/SW Contact  Zenda Alpers, Lenn Sink, RN Phone Number: 05/05/2020, 3:39 PM  Clinical Narrative:    Per TOC leadership - Jiles Crocker- pt has been approved for LOG (letter of guarantee) for [facility] needs with [facility]- however this will not include lab fees from [facility]- pt will need to pay out of pocket for the lab fees when labs are dropped off at [facility]. Per Pam with [facility]- [facility] can keep a credit card on file for the lab charges.  DME has been delivered to room per [employer]- RW and 3n1   Expected Discharge Plan: IP Rehab Facility Barriers to Discharge: Continued Medical Work up  Expected Discharge Plan and Services Expected Discharge Plan: IP Rehab Facility   Discharge Planning Services: CM Consult, Medication Assistance, Indigent Health Clinic, Follow-up appt scheduled   Living arrangements for the past 2 months: Single Family Home                 DME Arranged: 3-N-1, Walker rolling DME Agency: [facility]       HH Arranged: RN, IV Antibiotics, PT-   IV abx needs with [facility] coordinating with [facility]- pt did not qualify for PT under [facility] charity program.            Social Determinants of Health (SDOH) Interventions    Readmission Risk Interventions No flowsheet data found.

## 2020-05-06 LAB — BASIC METABOLIC PANEL
Anion gap: 10 (ref 5–15)
BUN: 9 mg/dL (ref 6–20)
CO2: 26 mmol/L (ref 22–32)
Calcium: 8.8 mg/dL — ABNORMAL LOW (ref 8.9–10.3)
Chloride: 104 mmol/L (ref 98–111)
Creatinine, Ser: 0.43 mg/dL — ABNORMAL LOW (ref 0.44–1.00)
GFR calc Af Amer: >60 mL/min (ref >60)
GFR calc non Af Amer: >60 mL/min (ref >60)
Glucose, Bld: 121 mg/dL — ABNORMAL HIGH (ref 70–99)
Potassium: 3.1 mmol/L — ABNORMAL LOW (ref 3.5–5.1)
Sodium: 140 mmol/L (ref 135–145)

## 2020-05-06 LAB — MAGNESIUM: Magnesium: 1.8 mg/dL (ref 1.7–2.4)

## 2020-05-06 MED ORDER — POTASSIUM CHLORIDE CRYS ER 20 MEQ PO TBCR
40.0000 meq | EXTENDED_RELEASE_TABLET | Freq: Two times a day (BID) | ORAL | Status: DC
  Administered 2020-05-06 (×2): 40 meq via ORAL
  Filled 2020-05-06 (×2): qty 2

## 2020-05-06 NOTE — Plan of Care (Signed)

## 2020-05-06 NOTE — Progress Notes (Signed)
PROGRESS NOTE    Debra Schmidt  XVQ:[id number] DOB: 01-06-1961 DOA: 04/23/2020 PCP: Patient, No Pcp Per    Brief Narrative:  59 y.o.female with remote history of Xanax and hydrocodone use presented to the ED 6/13 , she states that she fell in the bathtub several weeks ago injuring her right shoulder, eventually developed a draining wound on the top of her shoulder. She began to have progressively more severe back pain left urinary incontinence leading to admission 6/13. MRI showed L1-2 discitis with thoracolumbar abscess. She underwent emergent surgery by Dr. Christella Noa 6/13, OR cultures with MSSA, also noted to have right shoulder acromioclavicular septic arthritis and clavicular osteomyelitis on CT  Assessment & Plan:   Principal Problem:   MSSA bacteremia Active Problems:   Abscess in epidural space of lumbar spine   Hypokalemia   Hypomagnesemia   Status post surgery   Septic arthritis of right acromioclavicular joint (HCC)   Osteomyelitis of clavicle (HCC)   Cigarette smoker    MSSA bacteremia Epidural abscess with cauda equina Right shoulder septic arthritis and clavicular osteomyelitis infectious disease following -Antibiotics per ID recommendations, plan for minimum or 6 weeks of ancef,  ID to f/u in clinic next month -Underwent L1/2 laminectomy, epidural abscess evacuation on 6/13 -Pt underwent I&D and distal clavicle resection 6/15 per ortho.  Management per ID and neurosurgery.  Chronic hypokalemia and hypomagnesemia -Etiology not clear although albumin is <2. Suspect marked malnutrition in the setting of chronic pancreatitis prior to admit. Have consulted dietitian -Chart reviewed. Pt has hx of profound hypokalemia, first documented on 02/24/20 with a K of 2.7 -random cortisol level normal -Pt has required multiple doses of potassium replacement   After multiple doses of potassium and magnesium replacement (have calculated 1,059mEq of potassium given since  presentation until 60/23/2021).  Potassium low again today at 3.1.  Started her on potassium chloride 40 mEq p.o. daily but I will increase the frequency to twice daily.  Her magnesium has remained stable.  If she were to discharge home by primary team today then I would recommend discharging her on potassium chloride 40 mEq p.o. daily with BMP to be checked by PCP within next week.  If she has persistent hypokalemia, consultation with nephrology as outpatient might be beneficial.  Acute on chronic anemia -Stable  Hypertension -BP trends somewhat improved since starting lisinopril -Cont to titrate BP meds accordingly, stable presently   Tachycardia with tachypnea -Patient attributes to post-op pain. Patient reports marked pain, described as cramping, corresponding to times of documented tachycardia -CTA chest obtained and reviewed, neg for PE, however with incidental finding of acute on chronic pancreatitis, see below -VS have since improved with IVF hydration, see below  Acute on chronic pancreatitis/ pancreatic divisum -Noted incidentally on CTA chest  -Lipid panel unremarkable -RUQ [place] ordered and reviewed. Finding of 1.2cm dilated CBD with GB sludge.  GI on board and managing.  Advanced her diet to low-fat diet/soft diet. Seen by general surgery.  They recommended outpatient follow-up for laparoscopic cholecystectomy.  Pancreatitis has resolved now.  Patient's medical issues have been stable for the last several days other than intermittent hypokalemia for which I have started her on replacement twice daily.  Hospitalist service not offering much as far as active management goes and thus will be checking her chart remotely and not seeing patient on this requested again or needed again. we will order replacements of electrolytes while she is here.  I have seen her today however please call  me if this patient needs to be seen physically starting tomorrow.  DVT prophylaxis: SCD's Code Status:  Full Family Communication: Pt in room, family not at bedside  Consultants:   Orthopedic surgery  ID  TRH hospitalist  GI  Procedures:  PRE-OPERATIVE DIAGNOSIS: Epidural Mass  POST-OPERATIVE DIAGNOSIS: Epidural Abscess, L1/2 discitis  PROCEDURE: Procedure(s): Lumbar One-Two LAMINECTOMY for Epidural Abscess Evacuation  SURGEON:Surgeon(s):  Coletta Memos, MD   Antimicrobials: Anti-infectives (From admission, onward)   Start     Dose/Rate Route Frequency Ordered Stop   04/25/20 1641  vancomycin (VANCOCIN) powder  Status:  Discontinued          As needed 04/25/20 1641 04/25/20 1657   04/25/20 1200  ceFAZolin (ANCEF) IVPB 2g/100 mL premix     Discontinue     2 g 200 mL/hr over 30 Minutes Intravenous Every 8 hours 04/25/20 0930 06/03/20 2359   04/24/20 1200  vancomycin (VANCOREADY) IVPB 750 mg/150 mL  Status:  Discontinued        750 mg 150 mL/hr over 60 Minutes Intravenous Every 12 hours 04/24/20 0156 04/25/20 0930   04/24/20 0800  piperacillin-tazobactam (ZOSYN) IVPB 3.375 g  Status:  Discontinued        3.375 g 12.5 mL/hr over 240 Minutes Intravenous Every 8 hours 04/24/20 0156 04/24/20 1423   04/23/20 2215  piperacillin-tazobactam (ZOSYN) IVPB 3.375 g        3.375 g 100 mL/hr over 30 Minutes Intravenous  Once 04/23/20 2202 04/24/20 0031   04/23/20 2215  vancomycin (VANCOREADY) IVPB 1250 mg/250 mL        1,250 mg 166.7 mL/hr over 90 Minutes Intravenous  Once 04/23/20 2202 04/24/20 0101      Subjective: Patient seen and examined.  She has no complaints.  Objective: Vitals:   05/05/20 2023 05/05/20 2355 05/06/20 0350 05/06/20 0809  BP: (!) 148/92 (!) 143/89 113/75 109/72  Pulse: (!) 110 (!) 109 90 85  Resp: 16 18 16 18   Temp: 98.3 F (36.8 C) 98.6 F (37 C) 97.6 F (36.4 C) 97.8 F (36.6 C)  TempSrc:  Oral Oral Oral  SpO2: 98% 96% 96% 97%  Weight:      Height:        Intake/Output Summary (Last 24 hours) at 05/06/2020 1055 Last data filed at  05/06/2020 0430 Gross per 24 hour  Intake --  Output 3975 ml  Net -3975 ml   Filed Weights   04/23/20 1332  Weight: 62.6 kg    Examination: General exam: Appears calm and comfortable  Respiratory system: Clear to auscultation. Respiratory effort normal. Cardiovascular system: S1 & S2 heard, RRR. No JVD, murmurs, rubs, gallops or clicks. No pedal edema. Gastrointestinal system: Abdomen is nondistended, soft and nontender. No organomegaly or masses felt. Normal bowel sounds heard. Central nervous system: Alert and oriented. No focal neurological deficits. Skin: No rashes, lesions or ulcers.  Psychiatry: Judgement and insight appear normal. Mood & affect appropriate.    Data Reviewed: I have personally reviewed following labs and imaging studies  CBC: Recent Labs  Lab 05/04/20 0630  WBC 8.6  NEUTROABS 6.6  HGB 8.1*  HCT 26.2*  MCV 86.2  PLT 604*   Basic Metabolic Panel: Recent Labs  Lab 05/01/20 0532 05/01/20 0532 05/02/20 0500 05/03/20 0551 05/04/20 0630 05/05/20 0812 05/06/20 0500  NA 136   < > 134* 138 137 139 140  K 3.6   < > 4.3 3.5 3.3* 3.3* 3.1*  CL 103   < > 101  101 100 102 104  CO2 24   < > 22 24 26 25 26   GLUCOSE 128*   < > 119* 122* 119* 105* 121*  BUN 6   < > <5* <5* <5* 7 9  CREATININE 0.53   < > 0.55 0.60 0.55 0.43* 0.43*  CALCIUM 8.4*   < > 8.7* 9.0 8.7* 8.7* 8.8*  MG 1.8  --  1.8 1.9  --  1.8 1.8   < > = values in this interval not displayed.   GFR: Estimated Creatinine Clearance: 65.4 mL/min (A) (by C-G formula based on SCr of 0.43 mg/dL (L)). Liver Function Tests: Recent Labs  Lab 04/30/20 0134 05/01/20 0532 05/02/20 0500 05/03/20 0551 05/04/20 0630  AST 29 50* 36 39 32  ALT 10 15 13 12 10   ALKPHOS 86 132* 139* 126 107  BILITOT 0.7 0.7 1.0 0.8 0.6  PROT 6.1* 6.6 7.0 7.2 6.6  ALBUMIN 1.6* 1.7* 1.8* 1.8* 1.7*   Recent Labs  Lab 04/30/20 0134 05/01/20 0532 05/02/20 0500 05/03/20 0551 05/04/20 0630  LIPASE 137* 127* 106* 96* 91*    No results for input(s): AMMONIA in the last 168 hours. Coagulation Profile: No results for input(s): INR, PROTIME in the last 168 hours. Cardiac Enzymes: No results for input(s): CKTOTAL, CKMB, CKMBINDEX, TROPONINI in the last 168 hours. BNP (last 3 results) No results for input(s): PROBNP in the last 8760 hours. HbA1C: No results for input(s): HGBA1C in the last 72 hours. CBG: No results for input(s): GLUCAP in the last 168 hours. Lipid Profile: No results for input(s): CHOL, HDL, LDLCALC, TRIG, CHOLHDL, LDLDIRECT in the last 72 hours. Thyroid Function Tests: No results for input(s): TSH, T4TOTAL, FREET4, T3FREE, THYROIDAB in the last 72 hours. Anemia Panel: No results for input(s): VITAMINB12, FOLATE, FERRITIN, TIBC, IRON, RETICCTPCT in the last 72 hours. Sepsis Labs: Recent Labs  Lab 04/29/20 1813 04/30/20 0134  LATICACIDVEN 0.7 0.8    No results found for this or any previous visit (from the past 240 hour(s)).   Radiology Studies: No results found.  Scheduled Meds: . amLODipine  5 mg Oral Daily  . Chlorhexidine Gluconate Cloth  6 each Topical Daily  . docusate sodium  100 mg Oral BID  . lisinopril  10 mg Oral Daily  . magnesium oxide  200 mg Oral BID  . multivitamin with minerals  1 tablet Oral Daily  . nutrition supplement (JUVEN)  1 packet Oral BID BM  . oxyCODONE  15 mg Oral Q12H  . pantoprazole  40 mg Oral QHS  . potassium chloride  40 mEq Oral BID  . sodium chloride flush  10-40 mL Intracatheter Q12H  . sodium chloride flush  3 mL Intravenous Q12H  . tamsulosin  0.4 mg Oral QPC breakfast   Continuous Infusions: . sodium chloride    . sodium chloride    .  ceFAZolin (ANCEF) IV 2 g (05/06/20 0453)  . lactated ringers 100 mL/hr at 05/05/20 1332  . methocarbamol (ROBAXIN) IV 500 mg (04/28/20 0349)     LOS: 13 days   05/07/20, MD [facility] Pager On Amion  If 7PM-7AM, please contact night-coverage 05/06/2020, 10:55 AM

## 2020-05-06 NOTE — Progress Notes (Signed)
Patient ID: Debra Schmidt, female   DOB: October 02, 1961, 59 y.o.   MRN: [id number] Vital signs are stable Continues IV antibiotics Awaiting discharge once home therapies can be arranged

## 2020-05-07 LAB — BASIC METABOLIC PANEL
Anion gap: 9 (ref 5–15)
BUN: 12 mg/dL (ref 6–20)
CO2: 24 mmol/L (ref 22–32)
Calcium: 8.8 mg/dL — ABNORMAL LOW (ref 8.9–10.3)
Chloride: 102 mmol/L (ref 98–111)
Creatinine, Ser: 0.43 mg/dL — ABNORMAL LOW (ref 0.44–1.00)
GFR calc Af Amer: >60 mL/min (ref >60)
GFR calc non Af Amer: >60 mL/min (ref >60)
Glucose, Bld: 118 mg/dL — ABNORMAL HIGH (ref 70–99)
Potassium: 4.3 mmol/L (ref 3.5–5.1)
Sodium: 135 mmol/L (ref 135–145)

## 2020-05-07 LAB — MAGNESIUM: Magnesium: 1.8 mg/dL (ref 1.7–2.4)

## 2020-05-07 MED ORDER — POTASSIUM CHLORIDE CRYS ER 20 MEQ PO TBCR
40.0000 meq | EXTENDED_RELEASE_TABLET | Freq: Every day | ORAL | Status: DC
  Administered 2020-05-08 – 2020-05-09 (×2): 40 meq via ORAL
  Filled 2020-05-07 (×2): qty 2

## 2020-05-07 NOTE — Progress Notes (Signed)
Patient ID: Debra Schmidt, female   DOB: Dec 27, 1960, 59 y.o.   MRN: [id number] Vital signs are stable Patient is awaiting home IV antibiotics Hopeful for discharge tomorrow

## 2020-05-07 NOTE — Progress Notes (Signed)
Chart reviewed, patient not seen.  Patient's potassium is normal.  Will change the dosage to 40 mEq of potassium daily instead of twice daily.  Repeat labs in the morning.

## 2020-05-07 NOTE — Plan of Care (Signed)

## 2020-05-07 NOTE — Progress Notes (Signed)
2028: Reached out to on-call provider regarding > 3 In&Outs in last 24 hrs since foley was removed with last being at 1800. Received order to place foley at 0000 if still retaining.   0104: Foley placed.

## 2020-05-08 LAB — POTASSIUM: Potassium: 3.5 mmol/L (ref 3.5–5.1)

## 2020-05-08 LAB — MAGNESIUM: Magnesium: 1.8 mg/dL (ref 1.7–2.4)

## 2020-05-08 NOTE — TOC Progression Note (Addendum)
Transition of Care (TOC) - Progression Note  Donn Pierini RN,BSN Transitions of Care Unit 4NP (non trauma) - RN Case Manager [phone]   Patient Details  Name: Debra Schmidt MRN: [id number] Date of Birth: [birth year]  Transition of Care [facility]) CM/SW Contact  Zenda Alpers, Lenn Sink, RN Phone Number: 05/08/2020, 3:09 PM  Clinical Narrative:    CIR was consulted for possible admission however on 6/23 pt and spouse declined CIR due to cost. Referral was made to [facility] for possible charity care at home for [facility] therapies- however pt did not qualify for [facility] therapies under charity program. Per [facility] pt will not qualify for Medicaid either.   HHRN will be provided under LOG (letter of guarantee) for home IV abx PICC line care and lab draws per [facility] - coordinated with [facility] for home IV abx needs- Pt will have to pay out of pocket (will be billed) by [facility] for needed labwork when labs are dropped off there by [facility]. [facility] will be providing the IV abx - and have completed bedside teaching with the pt/spouse/son.   DME has been provided by [employer] RW and [facility]n[employer]- is at the bedside for home.    [facility] and [facility]ast[facility] Fiedler are on stand by for start of care when pt is cleared medically for discharge.   1545-update- received call from [place] with [facility] INPT rehab- pt and spouse are now agreeable to cost of rehab and would like to again explore that option- per Tresa Endo there may be a bed available tomorrow- she is reviewing financial info with pt/spouse today to be sure they are in agreement with INPT rehab cost and rehab plan. Will f/u with Tresa Endo in am and also relay final decision with Pam from [facility].    Expected Discharge Plan: IP Rehab Facility Barriers to Discharge: Continued Medical Work up  Expected Discharge Plan and Services Expected Discharge Plan: IP Rehab Facility   Discharge Planning Services: CM Consult, Medication Assistance,  Indigent Health Clinic, Follow-up appt scheduled   Living arrangements for the past 2 months: Single Family Home                 DME Arranged: 3-N-1, Walker rolling DME Agency: [employer]       HH Arranged: RN, IV Antibiotics, PT           Social Determinants of Health (SDOH) Interventions    Readmission Risk Interventions No flowsheet data found.

## 2020-05-08 NOTE — Progress Notes (Signed)
Occupational Therapy Treatment Patient Details Name: Debra Schmidt MRN: [id number] DOB: August 28, 1961 Today's Date: 05/08/2020    History of present illness Pt is a 59 y.o. female admitted on 04/23/20 for emergent decompression of throacolumbar epidural abcess, discitis L1/2 s/p laminectomy L1/2 and evacuation of abcess.  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 She is now s/p I&D and shoulder, AC joint repair. Infectious disease is following suspicious that both R shoulder and back are staph infections.  Blood cultures are pending.  Pt with no significant medical history listed in chart.   OT comments  This 59 yo female seen today with husband and son present to go over family education with them as far as bed mobility, transfers, use of gait belt, doffing/donning brace, precautions,  A needed for bathing,dressing, toileting, and that pt should be up at least 5 times a day ambulating and sitting up in chair at least 5 times a day for 30 minutes each. Pt can state 2/3 back precautions is not following them--especially when in bed (laying in contorted positions). Pt c/o of pain throughout session, pt had been premedicated. We will continue to follow.  Follow Up Recommendations  Home health OT;Supervision/Assistance - 24 hour    Equipment Recommendations  3 in 1 bedside commode       Precautions / Restrictions Precautions Precautions: Fall;Back Precaution Booklet Issued: Yes (comment) Precaution Comments: pt able to recall 2/3, pt re-educated; "let me see the piece of paper" (back handout)--but once I gave it to her she did not look at it to see what her back precautions were, she just kept trying to remember them Required Braces or Orthoses: Spinal Brace Spinal Brace: Lumbar corset;Applied in sitting position Restrictions Weight Bearing Restrictions: No RUE Weight Bearing: Weight bearing as tolerated RUE Partial Weight Bearing Percentage or Pounds: per  ortho nothing overhead       Mobility Bed Mobility Overal bed mobility: Needs Assistance Bed Mobility: Rolling;Sit to Sidelying Rolling: Min assist Sidelying to sit: Min assist     Sit to sidelying: Mod assist General bed mobility comments: VCs for sequencing  Transfers Overall transfer level: Needs assistance Equipment used: Rolling walker (2 wheeled) Transfers: Sit to/from [facility] Sit to Stand: Min assist Stand pivot transfers: Mod assist           Balance Overall balance assessment: Needs assistance Sitting-balance support: Bilateral upper extremity supported;Feet supported Sitting balance-Leahy Scale: Poor Sitting balance - Comments: Using Bil UEs to support self EOB (to help with pain)   Standing balance support: Bilateral upper extremity supported;During functional activity Standing balance-Leahy Scale: Poor Standing balance comment: dependent on RW and external support                           ADL either performed or assessed with clinical judgement   ADL Overall ADL's : Needs assistance/impaired                                       General ADL Comments: Educated pt's husband and son on bed mobility, transfers, use of gait belt, back precautions, how they will need to A her with bathing, dressing, toileting, how to doff/donn brace--had family return demonstrate all but ADLs. Also made them aware of using the 3n1 as a shower seat     Vision Patient Visual Report:  No change from baseline            Cognition Arousal/Alertness: Awake/alert Behavior During Therapy: WFL for tasks assessed/performed Overall Cognitive Status: No family/caregiver present to determine baseline cognitive functioning Area of Impairment: Memory;Safety/judgement;Following commands;Awareness;Problem solving                   Current Attention Level: Sustained Memory: Decreased short-term memory;Decreased recall of  precautions Following Commands: Follows multi-step commands inconsistently Safety/Judgement: Decreased awareness of safety;Decreased awareness of deficits Awareness: Emergent Problem Solving: Slow processing;Difficulty sequencing;Requires verbal cues;Requires tactile cues General Comments: could state 2/3 precautions but not following them              General Comments pt incisions without drainage    Pertinent Vitals/ Pain       Pain Assessment: 0-10 Pain Score: 10-Worst pain ever Pain Location: back Pain Descriptors / Indicators: Sore;Spasm Pain Intervention(s): Limited activity within patient's tolerance;Monitored during session;Premedicated before session         Frequency  Min 2X/week        Progress Toward Goals  OT Goals(current goals can now be found in the care plan section)  Progress towards OT goals: Progressing toward goals  Acute Rehab OT Goals Patient Stated Goal: to not be in so much pain OT Goal Formulation: With patient Time For Goal Achievement: 05/10/20 Potential to Achieve Goals: Fair  Plan Discharge plan remains appropriate       AM-PAC OT "6 Clicks" Daily Activity     Outcome Measure   Help from another person eating meals?: None Help from another person taking care of personal grooming?: A Little Help from another person toileting, which includes using toliet, bedpan, or urinal?: A Lot Help from another person bathing (including washing, rinsing, drying)?: A Lot Help from another person to put on and taking off regular upper body clothing?: A Little Help from another person to put on and taking off regular lower body clothing?: A Lot 6 Click Score: 16    End of Session Equipment Utilized During Treatment: Gait belt;Back brace  OT Visit Diagnosis: Unsteadiness on feet (R26.81);Other abnormalities of gait and mobility (R26.89);Muscle weakness (generalized) (M62.81);Pain Pain - part of body:  (back)   Activity Tolerance Patient limited  by pain   Patient Left in chair;with call bell/phone within reach;with family/visitor present;with chair alarm set   Nurse Communication Patient requests pain meds (had already had them before therapy)        Time: 7824-2353 OT Time Calculation (min): 52 min  Charges: OT General Charges $OT Visit: 1 Visit OT Treatments $Self Care/Home Management : 38-52 mins  Ignacia Palma, OTR/L Acute [employer] Pager [phone] Office [phone]      Evette Georges 05/08/2020, 4:18 PM

## 2020-05-08 NOTE — Progress Notes (Signed)
Patient ID: Debra Schmidt, female   DOB: 1961/08/31, 59 y.o.   MRN: [id number] BP 106/83 (BP Location: Right Arm)   Pulse 100   Temp 97.6 F (36.4 C)   Resp 20   Ht 5\' 4"  (1.626 m)   Wt 62.6 kg   SpO2 98%   BMI 23.69 kg/m  Alert and oriented x 4, speech is clear and fluent Will agree with Rehab, as patient cannot walk.

## 2020-05-08 NOTE — Progress Notes (Addendum)
Inpatient Rehabilitation-Admissions Coordinator   Was contacted by bedside RN that pt's family is now interested in [facility] program. Per most recent therapy notes, pt certainly continues to have IP Rehab needs and family recognizes need for rehab prior to return home. New consult order received and [facility] met with pt and her husband at the bedside. Reviewed estimated daily cost of care for uninsured patients, expected LOS, and anticipated assistance needed at DC. Confirmed DC support with pt's 7 yo son bedside. Pt's husband and pt in agreement for IP Rehab, despite potential cost due to being uninsured. Did discuss that the patient will most likely go home with IV antibiotics and that cost will be up to them as well. All family members in agreement.   Reviewed insurance benefits letter with pt's husband bedside and consent form signed. AC will follow for possible admit tomorrow, pending bed availability.   Please call if questions.   Raechel Ache, OTR/L  Rehab Admissions Coordinator  [phone] 05/08/2020 4:04 PM

## 2020-05-08 NOTE — Progress Notes (Signed)
Physical Therapy Treatment Patient Details Name: Debra Schmidt MRN: [id number] DOB: 30-Jul-1961 Today's Date: 05/08/2020    History of Present Illness Pt is a 59 y.o. female admitted on 04/23/20 for emergent decompression of throacolumbar epidural abcess, discitis L1/2 s/p laminectomy L1/2 and evacuation of abcess.  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 She is now s/p I&D and shoulder, AC joint repair. Infectious disease is following suspicious that both R shoulder and back are staph infections.  Blood cultures are pending.  Pt with no significant medical history listed in chart.    PT Comments    Pt continues to have back pain inhibiting ambulation progression and stair negotiation. Pt has 2 steps to enter home with hand rails and was unable to complete with PT providing maximal assistance. RN, Julieta Bellini, present to assist as well. Spoke with spouse who reports "my son and I will just carry her in ". Pt remains unable to recall or adhere to back precautions. Acute Pt to continue to recommend CIR or SNF however pt can not afford private pay. Pt will need 24/7 assist upon d/c for safe mobility at home.    Follow Up Recommendations  Home health PT;Supervision/Assistance - 24 hour     Equipment Recommendations  3in1 (PT);Wheelchair (measurements PT);Wheelchair cushion (measurements PT);Rolling walker with 5" wheels    Recommendations for Other Services Rehab consult     Precautions / Restrictions Precautions Precautions: Fall;Back Precaution Booklet Issued: Yes (comment) Precaution Comments: pt able to recall 2/3, pt re-educated Required Braces or Orthoses: Spinal Brace Spinal Brace: Lumbar corset Restrictions Weight Bearing Restrictions: Yes RUE Weight Bearing: Weight bearing as tolerated RUE Partial Weight Bearing Percentage or Pounds: per ortho nothing overhead    Mobility  Bed Mobility               General bed mobility comments: pt  up in chair  Transfers Overall transfer level: Needs assistance Equipment used: Rolling walker (2 wheeled) Transfers: Sit to/from Stand Sit to Stand: Mod assist         General transfer comment: max verbal cues to push up from chair not pull up on walker, increased time, modA to steady during transition of hands from chair to walker  Ambulation/Gait Ambulation/Gait assistance: Mod assist;+2 safety/equipment (chair follow) Gait Distance (Feet): 15 Feet Assistive device: Rolling walker (2 wheeled) Gait Pattern/deviations: Step-to pattern;Decreased stride length;Decreased stance time - left;Decreased step length - left Gait velocity: dec Gait velocity interpretation: <1.8 ft/sec, indicate of risk for recurrent falls General Gait Details: pt with short step length and decreased foot clearance on L foot, very dependent on RW. Pt with labored effort   Stairs Stairs: Yes Stairs assistance: +2 physical assistance;Max assist Stair Management: One rail Left;Step to pattern;Sideways;Forwards Number of Stairs: 2 General stair comments: attempted to ascend stairs sideways leading with R UE (stronger foot) however pt unable to complete due to unable to push up enough to clear L foot without maximal assist. Then attempted to ascend forward with L handrail and PT providing HHA on R to mimic other hands rail. Pt required maxA to achieve R knee extension and then modA to bring L foot up on step. Pt required to sit on PTs knee due to pain and weakness. pt assisted to chair with maxAx2, called spouse and he said "my son and I will just carry her in"   Wheelchair Mobility    Modified Rankin (Stroke Patients Only)  Balance Overall balance assessment: Needs assistance Sitting-balance support: No upper extremity supported;Feet supported Sitting balance-Leahy Scale: Good     Standing balance support: Bilateral upper extremity supported;During functional activity Standing balance-Leahy Scale:  Poor Standing balance comment: dependent on RW                            Cognition Arousal/Alertness: Awake/alert Behavior During Therapy: WFL for tasks assessed/performed Overall Cognitive Status: No family/caregiver present to determine baseline cognitive functioning Area of Impairment: Memory;Safety/judgement;Following commands;Awareness;Problem solving;Attention                   Current Attention Level: Sustained Memory: Decreased recall of precautions;Decreased short-term memory Following Commands: Follows multi-step commands inconsistently Safety/Judgement: Decreased awareness of safety;Decreased awareness of deficits Awareness: Emergent Problem Solving: Slow processing;Difficulty sequencing;Requires verbal cues;Requires tactile cues General Comments: No recall of precautions.      Exercises      General Comments General comments (skin integrity, edema, etc.): pt incisions without drainage      Pertinent Vitals/Pain Pain Assessment: 0-10 Pain Score: 8  Pain Location: back Pain Descriptors / Indicators: Discomfort Pain Intervention(s): Limited activity within patient's tolerance    Home Living                      Prior Function            PT Goals (current goals can now be found in the care plan section) Progress towards PT goals: Not progressing toward goals - comment    Frequency    Min 5X/week      PT Plan Current plan remains appropriate    Co-evaluation              AM-PAC PT "6 Clicks" Mobility   Outcome Measure  Help needed turning from your back to your side while in a flat bed without using bedrails?: A Little Help needed moving from lying on your back to sitting on the side of a flat bed without using bedrails?: A Little Help needed moving to and from a bed to a chair (including a wheelchair)?: A Lot Help needed standing up from a chair using your arms (e.g., wheelchair or bedside chair)?: A Little Help  needed to walk in hospital room?: A Lot Help needed climbing 3-5 steps with a railing? : Total 6 Click Score: 14    End of Session Equipment Utilized During Treatment: Gait belt;Back brace Activity Tolerance: Patient limited by pain Patient left: in chair;with call bell/phone within reach;with chair alarm set Nurse Communication: Mobility status PT Visit Diagnosis: Muscle weakness (generalized) (M62.81);Difficulty in walking, not elsewhere classified (R26.2);Pain;Other symptoms and signs involving the nervous system (R29.898) Pain - Right/Left: Right Pain - part of body: Shoulder     Time: 1007-1040 PT Time Calculation (min) (ACUTE ONLY): 33 min  Charges:  $Gait Training: 23-37 mins                     Kittie Plater, PT, DPT Acute Rehabilitation Services Pager #: [phone] Office #: [phone]    Berline Lopes 05/08/2020, 2:13 PM

## 2020-05-08 NOTE — Progress Notes (Signed)
Chart reviewed.  Patient not seen.  Potassium and magnesium both low normal.  She is already getting regular replacement.  We will not make any changes.

## 2020-05-08 NOTE — Progress Notes (Signed)
Patient ID: Debra Schmidt, female   DOB: 1961/01/10, 59 y.o.   MRN: [id number]   LOS: 15 days   Subjective: Doing well, ready for discharge. Denies right shoulder pain or stiffness.   Objective: Vital signs in last 24 hours: Temp:  [97.7 F (36.5 C)-98.3 F (36.8 C)] 98 F (36.7 C) (06/28 0719) Pulse Rate:  [64-99] 94 (06/28 0719) Resp:  [16-20] 18 (06/28 0719) BP: (97-128)/(69-84) 128/84 (06/28 0719) SpO2:  [92 %-98 %] 98 % (06/28 0719) Last BM Date: 05/06/20   Laboratory  CBC No results for input(s): WBC, HGB, HCT, PLT in the last 72 hours. BMET Recent Labs    05/06/20 0500 05/06/20 0500 05/07/20 0500 05/08/20 0520  NA 140  --  135  --   K 3.1*   < > 4.3 3.5  CL 104  --  102  --   CO2 26  --  24  --   GLUCOSE 121*  --  118*  --   BUN 9  --  12  --   CREATININE 0.43*  --  0.43*  --   CALCIUM 8.8*  --  8.8*  --    < > = values in this interval not displayed.     Physical Exam General appearance: alert and no distress  Right shoulder -- Incisions C/D/I, minimal TTP   Assessment/Plan: Right shoulder septic AC joint s/p I&D/resection -- D/C sutures. F/u with Dr. Eulah Pont in 2-3 weeks.    Freeman Caldron, PA-C Orthopedic Surgery [phone] 05/08/2020

## 2020-05-09 ENCOUNTER — Other Ambulatory Visit: Payer: Self-pay

## 2020-05-09 ENCOUNTER — Encounter (HOSPITAL_COMMUNITY): Payer: Self-pay | Admitting: Neurosurgery

## 2020-05-09 ENCOUNTER — Encounter (HOSPITAL_COMMUNITY): Payer: Self-pay | Admitting: Physical Medicine and Rehabilitation

## 2020-05-09 ENCOUNTER — Inpatient Hospital Stay (HOSPITAL_COMMUNITY)
Admission: RE | Admit: 2020-05-09 | Discharge: 2020-06-04 | DRG: 560 | Disposition: A | Discharge status: Home Health | Payer: Medicaid Other | Source: Intra-hospital | Attending: Physical Medicine and Rehabilitation | Admitting: Physical Medicine and Rehabilitation

## 2020-05-09 DIAGNOSIS — I959 Hypotension, unspecified: Secondary | ICD-10-CM | POA: Diagnosis present

## 2020-05-09 DIAGNOSIS — B1920 Unspecified viral hepatitis C without hepatic coma: Secondary | ICD-10-CM | POA: Diagnosis present

## 2020-05-09 DIAGNOSIS — G061 Intraspinal abscess and granuloma: Secondary | ICD-10-CM | POA: Diagnosis present

## 2020-05-09 DIAGNOSIS — I10 Essential (primary) hypertension: Secondary | ICD-10-CM | POA: Diagnosis present

## 2020-05-09 DIAGNOSIS — Z79899 Other long term (current) drug therapy: Secondary | ICD-10-CM

## 2020-05-09 DIAGNOSIS — E876 Hypokalemia: Secondary | ICD-10-CM | POA: Diagnosis not present

## 2020-05-09 DIAGNOSIS — Z8679 Personal history of other diseases of the circulatory system: Secondary | ICD-10-CM | POA: Diagnosis not present

## 2020-05-09 DIAGNOSIS — N319 Neuromuscular dysfunction of bladder, unspecified: Secondary | ICD-10-CM | POA: Diagnosis present

## 2020-05-09 DIAGNOSIS — Z4789 Encounter for other orthopedic aftercare: Secondary | ICD-10-CM | POA: Diagnosis present

## 2020-05-09 DIAGNOSIS — M009 Pyogenic arthritis, unspecified: Secondary | ICD-10-CM | POA: Diagnosis present

## 2020-05-09 DIAGNOSIS — M4626 Osteomyelitis of vertebra, lumbar region: Secondary | ICD-10-CM | POA: Diagnosis not present

## 2020-05-09 DIAGNOSIS — K5900 Constipation, unspecified: Secondary | ICD-10-CM | POA: Diagnosis present

## 2020-05-09 DIAGNOSIS — A539 Syphilis, unspecified: Secondary | ICD-10-CM | POA: Diagnosis present

## 2020-05-09 DIAGNOSIS — M5432 Sciatica, left side: Secondary | ICD-10-CM | POA: Diagnosis present

## 2020-05-09 DIAGNOSIS — K592 Neurogenic bowel, not elsewhere classified: Secondary | ICD-10-CM | POA: Diagnosis present

## 2020-05-09 DIAGNOSIS — B9561 Methicillin susceptible Staphylococcus aureus infection as the cause of diseases classified elsewhere: Secondary | ICD-10-CM | POA: Diagnosis not present

## 2020-05-09 DIAGNOSIS — R531 Weakness: Secondary | ICD-10-CM | POA: Diagnosis not present

## 2020-05-09 DIAGNOSIS — R9089 Other abnormal findings on diagnostic imaging of central nervous system: Secondary | ICD-10-CM

## 2020-05-09 DIAGNOSIS — R930 Abnormal findings on diagnostic imaging of skull and head, not elsewhere classified: Secondary | ICD-10-CM | POA: Diagnosis not present

## 2020-05-09 DIAGNOSIS — G629 Polyneuropathy, unspecified: Secondary | ICD-10-CM | POA: Diagnosis present

## 2020-05-09 DIAGNOSIS — M62830 Muscle spasm of back: Secondary | ICD-10-CM | POA: Diagnosis present

## 2020-05-09 DIAGNOSIS — M792 Neuralgia and neuritis, unspecified: Secondary | ICD-10-CM | POA: Diagnosis not present

## 2020-05-09 DIAGNOSIS — F419 Anxiety disorder, unspecified: Secondary | ICD-10-CM | POA: Diagnosis present

## 2020-05-09 DIAGNOSIS — D649 Anemia, unspecified: Secondary | ICD-10-CM | POA: Diagnosis not present

## 2020-05-09 DIAGNOSIS — K760 Fatty (change of) liver, not elsewhere classified: Secondary | ICD-10-CM | POA: Diagnosis present

## 2020-05-09 DIAGNOSIS — R0989 Other specified symptoms and signs involving the circulatory and respiratory systems: Secondary | ICD-10-CM | POA: Diagnosis not present

## 2020-05-09 DIAGNOSIS — M4646 Discitis, unspecified, lumbar region: Secondary | ICD-10-CM | POA: Diagnosis not present

## 2020-05-09 DIAGNOSIS — R339 Retention of urine, unspecified: Secondary | ICD-10-CM | POA: Diagnosis present

## 2020-05-09 DIAGNOSIS — G8222 Paraplegia, incomplete: Secondary | ICD-10-CM | POA: Diagnosis not present

## 2020-05-09 DIAGNOSIS — K861 Other chronic pancreatitis: Secondary | ICD-10-CM | POA: Diagnosis present

## 2020-05-09 DIAGNOSIS — F17210 Nicotine dependence, cigarettes, uncomplicated: Secondary | ICD-10-CM | POA: Diagnosis present

## 2020-05-09 DIAGNOSIS — B182 Chronic viral hepatitis C: Secondary | ICD-10-CM

## 2020-05-09 DIAGNOSIS — M62838 Other muscle spasm: Secondary | ICD-10-CM

## 2020-05-09 LAB — POTASSIUM: Potassium: 3.4 mmol/L — ABNORMAL LOW (ref 3.5–5.1)

## 2020-05-09 LAB — MAGNESIUM: Magnesium: 2 mg/dL (ref 1.7–2.4)

## 2020-05-09 MED ORDER — DIPHENHYDRAMINE HCL 12.5 MG/5ML PO ELIX
12.5000 mg | ORAL_SOLUTION | Freq: Four times a day (QID) | ORAL | Status: DC | PRN
  Administered 2020-05-23: 25 mg via ORAL
  Filled 2020-05-09: qty 10

## 2020-05-09 MED ORDER — OXYCODONE HCL 5 MG PO TABS
10.0000 mg | ORAL_TABLET | ORAL | Status: DC | PRN
  Administered 2020-05-10 – 2020-05-17 (×19): 10 mg via ORAL
  Filled 2020-05-09 (×21): qty 2

## 2020-05-09 MED ORDER — LIDOCAINE HCL URETHRAL/MUCOSAL 2 % EX GEL
CUTANEOUS | Status: DC | PRN
  Filled 2020-05-09: qty 5

## 2020-05-09 MED ORDER — ACETAMINOPHEN 325 MG PO TABS: 325.0000 mg | ORAL_TABLET | ORAL | Status: DC | PRN

## 2020-05-09 MED ORDER — POTASSIUM CHLORIDE CRYS ER 20 MEQ PO TBCR: 20.0000 meq | EXTENDED_RELEASE_TABLET | Freq: Every day | ORAL | Status: DC

## 2020-05-09 MED ORDER — DOCUSATE SODIUM 100 MG PO CAPS
200.0000 mg | ORAL_CAPSULE | Freq: Every day | ORAL | Status: DC
  Administered 2020-05-10 – 2020-06-04 (×27): 200 mg via ORAL
  Filled 2020-05-09 (×26): qty 2

## 2020-05-09 MED ORDER — CEFAZOLIN SODIUM-DEXTROSE 2-4 GM/100ML-% IV SOLN
2.0000 g | Freq: Three times a day (TID) | INTRAVENOUS | Status: AC
  Administered 2020-05-09 – 2020-06-03 (×76): 2 g via INTRAVENOUS
  Filled 2020-05-09 (×78): qty 100

## 2020-05-09 MED ORDER — OXYCODONE HCL 5 MG PO TABS
5.0000 mg | ORAL_TABLET | ORAL | Status: DC | PRN
  Administered 2020-05-09 – 2020-05-16 (×2): 5 mg via ORAL
  Filled 2020-05-09 (×4): qty 1

## 2020-05-09 MED ORDER — POTASSIUM CHLORIDE CRYS ER 20 MEQ PO TBCR
40.0000 meq | EXTENDED_RELEASE_TABLET | Freq: Every day | ORAL | Status: DC
  Administered 2020-05-10 – 2020-06-04 (×27): 40 meq via ORAL
  Filled 2020-05-09 (×26): qty 2

## 2020-05-09 MED ORDER — CHLORHEXIDINE GLUCONATE CLOTH 2 % EX PADS
6.0000 | MEDICATED_PAD | Freq: Every day | CUTANEOUS | Status: DC
  Administered 2020-05-10: 6 via TOPICAL

## 2020-05-09 MED ORDER — POLYETHYLENE GLYCOL 3350 17 G PO PACK
17.0000 g | PACK | Freq: Every day | ORAL | Status: DC | PRN
  Administered 2020-05-27 – 2020-05-31 (×2): 17 g via ORAL
  Filled 2020-05-09 (×2): qty 1

## 2020-05-09 MED ORDER — BISACODYL 10 MG RE SUPP
10.0000 mg | Freq: Every day | RECTAL | Status: DC | PRN
  Administered 2020-05-14 – 2020-05-20 (×2): 10 mg via RECTAL
  Filled 2020-05-09 (×2): qty 1

## 2020-05-09 MED ORDER — PANTOPRAZOLE SODIUM 40 MG PO TBEC
40.0000 mg | DELAYED_RELEASE_TABLET | Freq: Every day | ORAL | Status: DC
  Administered 2020-05-09 – 2020-06-03 (×26): 40 mg via ORAL
  Filled 2020-05-09 (×26): qty 1

## 2020-05-09 MED ORDER — MAGNESIUM OXIDE 400 (241.3 MG) MG PO TABS
200.0000 mg | ORAL_TABLET | Freq: Two times a day (BID) | ORAL | Status: DC
  Administered 2020-05-09 – 2020-06-04 (×52): 200 mg via ORAL
  Filled 2020-05-09 (×53): qty 1

## 2020-05-09 MED ORDER — AMLODIPINE BESYLATE 5 MG PO TABS
5.0000 mg | ORAL_TABLET | Freq: Every day | ORAL | Status: DC
  Administered 2020-05-10: 5 mg via ORAL
  Filled 2020-05-09 (×2): qty 1

## 2020-05-09 MED ORDER — ADULT MULTIVITAMIN W/MINERALS CH
1.0000 | ORAL_TABLET | Freq: Every day | ORAL | Status: DC
  Administered 2020-05-10 – 2020-06-04 (×27): 1 via ORAL
  Filled 2020-05-09 (×26): qty 1

## 2020-05-09 MED ORDER — TAMSULOSIN HCL 0.4 MG PO CAPS
0.4000 mg | ORAL_CAPSULE | Freq: Every day | ORAL | Status: DC
  Administered 2020-05-10 – 2020-05-15 (×7): 0.4 mg via ORAL
  Filled 2020-05-09 (×6): qty 1

## 2020-05-09 MED ORDER — PROCHLORPERAZINE EDISYLATE 10 MG/2ML IJ SOLN: 5.0000 mg | Freq: Four times a day (QID) | INTRAMUSCULAR | Status: DC | PRN

## 2020-05-09 MED ORDER — DIAZEPAM 2 MG PO TABS
2.0000 mg | ORAL_TABLET | Freq: Two times a day (BID) | ORAL | Status: DC | PRN
  Administered 2020-05-10: 2 mg via ORAL
  Filled 2020-05-09: qty 1

## 2020-05-09 MED ORDER — TRAZODONE HCL 50 MG PO TABS
25.0000 mg | ORAL_TABLET | Freq: Every evening | ORAL | Status: DC | PRN
  Administered 2020-05-26 – 2020-05-30 (×4): 50 mg via ORAL
  Filled 2020-05-09 (×4): qty 1

## 2020-05-09 MED ORDER — DOCUSATE SODIUM 100 MG PO CAPS: 100.0000 mg | ORAL_CAPSULE | Freq: Two times a day (BID) | ORAL | Status: DC

## 2020-05-09 MED ORDER — CYCLOBENZAPRINE HCL 5 MG PO TABS
5.0000 mg | ORAL_TABLET | Freq: Three times a day (TID) | ORAL | Status: DC
  Administered 2020-05-09 – 2020-05-18 (×27): 5 mg via ORAL
  Filled 2020-05-09 (×26): qty 1

## 2020-05-09 MED ORDER — ZOLPIDEM TARTRATE 5 MG PO TABS
5.0000 mg | ORAL_TABLET | Freq: Every evening | ORAL | Status: DC | PRN
  Administered 2020-05-26 – 2020-06-03 (×6): 5 mg via ORAL
  Filled 2020-05-09 (×6): qty 1

## 2020-05-09 MED ORDER — DIAZEPAM 5 MG PO TABS: 5.0000 mg | ORAL_TABLET | Freq: Four times a day (QID) | ORAL | Status: DC | PRN

## 2020-05-09 MED ORDER — FLEET ENEMA 7-19 GM/118ML RE ENEM: 1.0000 | ENEMA | Freq: Once | RECTAL | Status: DC | PRN

## 2020-05-09 MED ORDER — ENSURE MAX PROTEIN PO LIQD
11.0000 [oz_av] | Freq: Every day | ORAL | Status: DC
  Administered 2020-05-09 – 2020-06-03 (×24): 11 [oz_av] via ORAL
  Filled 2020-05-09 (×27): qty 330

## 2020-05-09 MED ORDER — JUVEN PO PACK
1.0000 | PACK | Freq: Two times a day (BID) | ORAL | Status: DC
  Administered 2020-05-10 – 2020-06-02 (×37): 1 via ORAL
  Filled 2020-05-09 (×38): qty 1

## 2020-05-09 MED ORDER — ADULT MULTIVITAMIN W/MINERALS CH: 1.0000 | ORAL_TABLET | Freq: Every day | ORAL | Status: DC

## 2020-05-09 MED ORDER — LISINOPRIL 10 MG PO TABS
10.0000 mg | ORAL_TABLET | Freq: Every day | ORAL | Status: DC
  Administered 2020-05-10: 10 mg via ORAL
  Filled 2020-05-09 (×3): qty 1

## 2020-05-09 MED ORDER — GUAIFENESIN-DM 100-10 MG/5ML PO SYRP: 5.0000 mL | ORAL_SOLUTION | Freq: Four times a day (QID) | ORAL | Status: DC | PRN

## 2020-05-09 MED ORDER — OXYCODONE HCL ER 15 MG PO T12A
15.0000 mg | EXTENDED_RELEASE_TABLET | Freq: Two times a day (BID) | ORAL | Status: DC
  Administered 2020-05-09 – 2020-05-10 (×2): 15 mg via ORAL
  Filled 2020-05-09 (×2): qty 1

## 2020-05-09 MED ORDER — ALUM & MAG HYDROXIDE-SIMETH 200-200-20 MG/5ML PO SUSP: 30.0000 mL | ORAL | Status: DC | PRN

## 2020-05-09 MED ORDER — PHENAZOPYRIDINE HCL 100 MG PO TABS
95.0000 mg | ORAL_TABLET | Freq: Three times a day (TID) | ORAL | Status: DC | PRN
  Filled 2020-05-09: qty 1

## 2020-05-09 MED ORDER — PROCHLORPERAZINE 25 MG RE SUPP: 12.5000 mg | Freq: Four times a day (QID) | RECTAL | Status: DC | PRN

## 2020-05-09 MED ORDER — POTASSIUM CHLORIDE CRYS ER 20 MEQ PO TBCR
40.0000 meq | EXTENDED_RELEASE_TABLET | Freq: Once | ORAL | Status: AC
  Administered 2020-05-09: 40 meq via ORAL
  Filled 2020-05-09: qty 2

## 2020-05-09 MED ORDER — PROCHLORPERAZINE MALEATE 5 MG PO TABS: 5.0000 mg | ORAL_TABLET | Freq: Four times a day (QID) | ORAL | Status: DC | PRN

## 2020-05-09 NOTE — Progress Notes (Signed)
Chart reviewed.  Potassium 3.4 again.  She is supposed to get 1 dose of 40 mEq potassium.  Will add 1 more dose today.  Magnesium normal.  Already on oral magnesium.  No change in that.

## 2020-05-09 NOTE — Plan of Care (Signed)
  Problem: RH SAFETY Goal: RH STG ADHERE TO SAFETY PRECAUTIONS W/ASSISTANCE/DEVICE Description: STG Adhere to Safety Precautions With Assistance/Device. Outcome: Progressing   Problem: RH PAIN MANAGEMENT Goal: RH STG PAIN MANAGED AT OR BELOW PT'S PAIN GOAL Outcome: Progressing   

## 2020-05-09 NOTE — Progress Notes (Signed)
Inpatient Rehabilitation-Admissions Coordinator   Notified pt and her husband of bed offer today in CIR. They have accepted offer. All questions answered at this time. Received medical clearance from Dr. Franky Macho for admit to CIR today. Notified RN and TOC team of plan.   Please call if questions.   Cheri Rous, OTR/L  Rehab Admissions Coordinator  [phone] 05/09/2020 12:11 PM

## 2020-05-09 NOTE — TOC Transition Note (Signed)
Transition of Care (TOC) - CM/SW Discharge Note Donn Pierini RN,BSN Transitions of Care Unit 4NP (non trauma) - RN Case Manager See Treatment Team for direct Phone #   Patient Details  Name: Debra Schmidt MRN: [id number] Date of Birth: 01/10/1961  Transition of Care [facility]) CM/SW Contact:  Darrold Span, RN Phone Number: 05/09/2020, 3:38 PM   Clinical Narrative:    Pt and spouse [facility] and reviewed cost with them and they are agreeable at this time. Bed available today for transition to [facility] INPT rehab. Pt will need IV abx until July 24, has PICC.   Notified Pam with [facility]- that plan had changed from home to INPT rehab- no HH services needed at this time- however pt will need IV abx on discharge from INPT rehab. TOC leadership also notified and LOG will be canceled at this time as pt is no longer going home currently and will need HH arrangements made on discharge from INPT rehab.   Pt had RW and BSC at bedside from [employer] for home.    Final next level of care: IP Rehab Facility Barriers to Discharge: Barriers Resolved   Patient Goals and CMS Choice Patient states their goals for this hospitalization and ongoing recovery are:: "get better and be walking" CMS Medicare.gov Compare Post Acute Care list provided to:: Patient Choice offered to / list presented to : NA (charity)  Discharge Placement               [facility] INPT rehab        Discharge Plan and Services   Discharge Planning Services: CM Consult, Medication Assistance, [facility], Follow-up appt scheduled            DME Arranged: 3-N-1, Walker rolling DME Agency: [facility]       HH Arranged: RN, IV Antibiotics, PT          Social Determinants of Health (SDOH) Interventions     Readmission Risk Interventions No flowsheet data found.

## 2020-05-09 NOTE — Progress Notes (Addendum)
Pt. Accepted to CIR. CIR spine physician came to assess patient around 1300. Pt. Aware and ready for transfer to CIR. IV changed to [facility] for transfer at 1400. Foley catheter in place and intact. Pt OOB in chair. Appetite poor. Wounds/incision examined by provider. Back brace on while OOB to chair. Pt weak all over but BLE esp.weak. CIR provider discussed transfer with patient. Awaiting orders after assessment. Gabriel Cirri RN    1530: Dr. Franky Macho wrote discharge orders. Pending Rehab orders. Report given to [facility] RN/CIR. OT returned patient to bed. Foley in place. PICC line intact. IV SL for transfer. Vitals signs stable. Gabriel Cirri RN

## 2020-05-09 NOTE — Progress Notes (Signed)
Debra Schmidt, OT  Rehab Admission Coordinator  Physical Medicine and Rehabilitation  PMR Pre-admission      Signed  Date of Service:  04/26/2020  2:24 PM      Related encounter: ED to Hosp-Admission (Discharged) from 04/23/2020 in [place] [facility]      Signed       Show:Clear all Manual[x] Template[x] Copied  Added by: Debra Schmidt, OT  Hover for details PMR Admission Coordinator Pre-Admission Assessment   Patient: Debra Schmidt is an 59 y.o., female MRN: [id number] DOB: 1961/05/24 Schmidt:  (162.6 cm) Weight: 62.6 kg                                                                                                                                                  Insurance Information HMO:     PPO:      PCP:      IPA:      80/20:      OTHER:  PRIMARY: Uninsured (self pay)      Policy#:       Subscriber:  CM Name:       Phone#:      Fax#:  Pre-Cert#:       Employer:  Benefits:  Phone #:      Name:  Eff. Date:      Deduct:       Out of Pocket Max:       Life Max:   CIR: Pt/family are aware of estimated daily cost of care ($3,500). [facility] is following.       SNF:  Outpatient:      Co-Pay:  Home Health:       Co-Pay:  DME:      Co-Pay:  Providers:  SECONDARY: None      Policy#:       Phone#:    Financial Counselor: Clement Husbands      Phone#: [phone] (has been following, spoke to husband)    The "Data Collection Information Summary" for patients in Inpatient Rehabilitation Facilities with attached "Privacy Act Statement-Health Care Records" was provided and verbally reviewed with: N/A   Emergency Contact Information         Contact Information     Name Relation Home Work Mobile    Debra Schmidt [phone]   [phone]       Current Medical History  Patient Admitting Diagnosis: Severe thoraco-lumbar and right shoulder infections secondary to MSSA bacteremia   History of Present Illness:  Debra Schmidt is a 59 y.o.  female with history of tobacco use and back pain with progression x72 hours with BLE weakness, constipation and bladder incontinence who was admitted via ED on 04/23/2020.  Potassium 2.0 at admission with elevated BNP and urinary retention with return of 1000 cc urine with Foley placement.  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  She was taken to the OR emergently for L1/L2 laminectomy with evacuation of epidural abscess by Dr. Mikal Planeabell.    Hypomagnesemia/hypokalemia aggressively supplemented.  UDS negative and patient with distant history of IVDU.  She reported pain right shoulder and was noted to have purulent drainage. CT shoulder done and revealed showed soft tissue ulceration on top of shoulder with underlying AC septic arthritis and osteomyelitis of the acromion and distal clavicle with small amount of fluid in subacromial/subdeltoid bursa.  Dr. Orvan Falconerampbell consulted for input on antibiotic regimen and recommended vancomycin alone pending final culture results.  Dr. Eulah PontMurphy consulted and plans for patient to undergo I&D distal clavicle/acromion. MSSA infection confirmed and pt antibiotics changed to cefazolin for at least 6 weeks. On 6/15 pt underwent irrigaiton, debridement, and resection of bone of right AC join by Dr. Eulah PontMurphy. On 6/19, pt with tachycardia and tachypnea which pt reports due to pain. CXR negative and CTA of chest r/o PE but did incidentally show findings consistent with acute pancreatitis. GI consulted and pt made NPO; MRCP revealed  gallstones but no CBD stones. GI recommended follow up with GI as outpatient to discuss elective lap chole. Able to tolerate slow progression of diet and pancreatitis improving. Therapy evaluations completed revealing deficits in mobility and ADLS. CIR recommended due to functional deficits. Pt is to admit to CIR on 05/09/20.   Glasgow Coma Scale Score: 15   Past Medical History  History reviewed. No pertinent past medical history.   Family History  family history includes Cancer in her father and mother.   Prior Rehab/Hospitalizations:  Has the patient had prior rehab or hospitalizations prior to admission? No   Has the patient had major surgery during 100 days prior to admission? Yes   Current Medications    Current Facility-Administered Medications:  .  0.9 %  sodium chloride infusion, 250 mL, Intravenous, Continuous, Martensen, Lucretia KernHenry Calvin III, PA-C .  0.9 %  sodium chloride infusion, , Intravenous, Continuous, Martensen, Lucretia KernHenry Calvin III, PA-C .  acetaminophen (TYLENOL) tablet 650 mg, 650 mg, Oral, Q4H PRN, 650 mg at 04/27/20 0800 **OR** acetaminophen (TYLENOL) suppository 650 mg, 650 mg, Rectal, Q4H PRN, Martensen, Lucretia KernHenry Calvin III, PA-C .  amLODipine (NORVASC) tablet 5 mg, 5 mg, Oral, Daily, Martensen, Lucretia KernHenry Calvin III, PA-C, 5 mg at 05/09/20 0913 .  bisacodyl (DULCOLAX) EC tablet 5 mg, 5 mg, Oral, Daily PRN, Albina BilletMartensen, Debra Calvin III, PA-C, 5 mg at 04/24/20 0958 .  ceFAZolin (ANCEF) IVPB 2g/100 mL premix, 2 g, Intravenous, Q8H, Della GooSinclair, Emily S, RPH, Last Rate: 200 mL/hr at 05/09/20 1126, 2 g at 05/09/20 1126 .  Chlorhexidine Gluconate Cloth 2 % PADS 6 each, 6 each, Topical, Daily, Albina BilletMartensen, Debra Calvin III, PA-C, 6 each at 05/09/20 [phone] .  diazepam (VALIUM) tablet 5 mg, 5 mg, Oral, Q6H PRN, Albina BilletMartensen, Debra Calvin III, PA-C, 5 mg at 05/09/20 0601 .  docusate sodium (COLACE) capsule 100 mg, 100 mg, Oral, BID, Albina BilletMartensen, Debra Calvin III, PA-C, 100 mg at 05/09/20 0913 .   hydrALAZINE (APRESOLINE) injection 5-10 mg, 5-10 mg, Intravenous, Q1H PRN, Meyran, Tiana LoftKimberly Hannah, NP, 5 mg  at 04/27/20 0419 .  lactated ringers infusion, , Intravenous, Continuous, Jerald Kief, MD, Last Rate: 100 mL/hr at 05/08/20 2147, New Bag at 05/08/20 2147 .  lisinopril (ZESTRIL) tablet 10 mg, 10 mg, Oral, Daily, Jerald Kief, MD, 10 mg at 05/09/20 0913 .  magnesium citrate solution 1 Bottle, 1 Bottle, Oral, Once PRN, Martensen, Lucretia Kern III, PA-C .  magnesium oxide (MAG-OX) tablet 200 mg, 200 mg, Oral, BID, Pahwani, Ravi, MD, 200 mg at 05/09/20 0912 .  menthol-cetylpyridinium (CEPACOL) lozenge 3 mg, 1 lozenge, Oral, PRN **OR** phenol (CHLORASEPTIC) mouth spray 1 spray, 1 spray, Mouth/Throat, PRN, Martensen, Lucretia Kern III, PA-C .  methocarbamol (ROBAXIN) 500 mg in dextrose 5 % 50 mL IVPB, 500 mg, Intravenous, Q6H PRN, Last Rate: 100 mL/hr at 04/28/20 0349, 500 mg at 04/28/20 0349 **OR** methocarbamol (ROBAXIN) tablet 500 mg, 500 mg, Oral, Q6H PRN, Meyran, Tiana Loft, NP, 500 mg at 05/09/20 0912 .  metoprolol tartrate (LOPRESSOR) injection 5 mg, 5 mg, Intravenous, Q5 min PRN, Jerald Kief, MD .  morphine 2 MG/ML injection 2 mg, 2 mg, Intravenous, Q2H PRN, Albina Billet III, PA-C, 2 mg at 04/28/20 0956 .  multivitamin with minerals tablet 1 tablet, 1 tablet, Oral, Daily, Coletta Memos, MD, 1 tablet at 05/09/20 0913 .  nutrition supplement (JUVEN) (JUVEN) powder packet 1 packet, 1 packet, Oral, BID BM, Coletta Memos, MD, 1 packet at 05/09/20 0912 .  ondansetron (ZOFRAN) tablet 4 mg, 4 mg, Oral, Q6H PRN **OR** ondansetron (ZOFRAN) injection 4 mg, 4 mg, Intravenous, Q6H PRN, Martensen, Lucretia Kern III, PA-C .  oxyCODONE (Oxy IR/ROXICODONE) immediate release tablet 10 mg, 10 mg, Oral, Q3H PRN, Albina Billet III, PA-C, 10 mg at 05/08/20 1251 .  oxyCODONE (Oxy IR/ROXICODONE) immediate release tablet 5 mg, 5 mg, Oral, Q3H PRN, Albina Billet III, PA-C, 5  mg at 05/06/20 0520 .  oxyCODONE (OXYCONTIN) 12 hr tablet 15 mg, 15 mg, Oral, Q12H, Martensen, Lucretia Kern III, PA-C, 15 mg at 05/09/20 0913 .  pantoprazole (PROTONIX) EC tablet 40 mg, 40 mg, Oral, QHS, Coletta Memos, MD, 40 mg at 05/08/20 2102 .  phenazopyridine (PYRIDIUM) tablet 100 mg, 100 mg, Oral, TID PRN, Albina Billet III, PA-C, 100 mg at 04/25/20 2134 .  potassium chloride SA (KLOR-CON) CR tablet 40 mEq, 40 mEq, Oral, Daily, Pahwani, Ravi, MD, 40 mEq at 05/09/20 0912 .  potassium chloride SA (KLOR-CON) CR tablet 40 mEq, 40 mEq, Oral, Once, Pahwani, Ravi, MD .  senna-docusate (Senokot-S) tablet 1 tablet, 1 tablet, Oral, QHS PRN, Albina Billet III, PA-C, 1 tablet at 05/02/20 [phone] .  sodium chloride flush (NS) 0.9 % injection 10-40 mL, 10-40 mL, Intracatheter, PRN, Martensen, Lucretia Kern III, PA-C .  sodium chloride flush (NS) 0.9 % injection 10-40 mL, 10-40 mL, Intracatheter, Q12H, Coletta Memos, MD, 10 mL at 05/09/20 0918 .  sodium chloride flush (NS) 0.9 % injection 10-40 mL, 10-40 mL, Intracatheter, PRN, Coletta Memos, MD .  sodium chloride flush (NS) 0.9 % injection 3 mL, 3 mL, Intravenous, Q12H, Martensen, Lucretia Kern III, PA-C, 3 mL at 05/09/20 0918 .  sodium chloride flush (NS) 0.9 % injection 3 mL, 3 mL, Intravenous, PRN, Albina Billet III, PA-C, 3 mL at 05/03/20 0946 .  tamsulosin (FLOMAX) capsule 0.4 mg, 0.4 mg, Oral, QPC breakfast, Pahwani, Ravi, MD, 0.4 mg at 05/09/20 0913 .  zolpidem (AMBIEN) tablet 5 mg, 5 mg, Oral, QHS PRN, Albina Billet III, PA-C, 5 mg  at 05/04/20 2146   Patients Current Diet:     Diet Order                      Diet Heart Room service appropriate? Yes; Fluid consistency: Thin  Diet effective now                      Precautions / Restrictions Precautions Precautions: Fall, Back Precaution Booklet Issued: Yes (comment) Precaution Comments: pt able to recall 2/3, pt re-educated; "let me see the piece of  paper" (back handout)--but once I gave it to her she did not look at it to see what her back precautions were, she just kept trying to remember them Spinal Brace: Lumbar corset, Applied in sitting position Restrictions Weight Bearing Restrictions: Yes RUE Weight Bearing: Weight bearing as tolerated RUE Partial Weight Bearing Percentage or Pounds:  (Per ortho, nothing overhead. ) Other Position/Activity Restrictions: no overhead movement RUE    Has the patient had 2 or more falls or a fall with injury in the past year?Yes   Prior Activity Level Community (5-7x/wk): very active until onset of infection; not working but does drive. Independent without an AD PTA.    Prior Functional Level Prior Function Level of Independence: Needs assistance Gait / Transfers Assistance Needed: per pt, she was ambulating with rollator for the past two weeks, fell and scraped her R shoulder.    Self Care: Did the patient need help bathing, dressing, using the toilet or eating?  Independent   Indoor Mobility: Did the patient need assistance with walking from room to room (with or without device)? Independent   Stairs: Did the patient need assistance with internal or external stairs (with or without device)? Independent   Functional Cognition: Did the patient need help planning regular tasks such as shopping or remembering to take medications? Independent   Home Assistive Devices / Equipment Home Assistive Devices/Equipment: Cane (specify quad or straight) Home Equipment: Walker - 4 wheels   Prior Device Use: Indicate devices/aids used by the patient prior to current illness, exacerbation or injury? None of the above   Current Functional Level Cognition   Overall Cognitive Status: No family/caregiver present to determine baseline cognitive functioning Current Attention Level: Sustained Orientation Level: Oriented X4 Following Commands: Follows multi-step commands inconsistently Safety/Judgement:  Decreased awareness of safety, Decreased awareness of deficits General Comments: could state 2/3 precautions but not following them    Extremity Assessment (includes Sensation/Coordination)   Upper Extremity Assessment: Generalized weakness, RUE deficits/detail RUE Deficits / Details: recent AC joint debriedment  Lower Extremity Assessment: Defer to PT evaluation RLE Deficits / Details: bil LE weakness L>R when assessed at bed level.  unable to lift either leg against gravity (R weakly, but not through full ROM, L unable to clear back of thigh off of bed).   RLE Sensation:  (per pt report normal and equal bil) LLE Deficits / Details: bil LE weakness L>R when assessed at bed level.  unable to lift either leg against gravity (R weakly, but not through full ROM, L unable to clear back of thigh off of bed).   LLE Sensation:  (per pt report WNL and equal bil)     ADLs   Overall ADL's : Needs assistance/impaired Eating/Feeding: Set up, Sitting Grooming: Set up, Sitting Upper Body Bathing: Minimal assistance, Sitting Lower Body Bathing: Moderate assistance, Maximal assistance, Sitting/lateral leans, Sit to/from stand Upper Body Dressing : Maximal assistance, Sitting Upper Body Dressing Details (  indicate cue type and reason): to don brace EOB Lower Body Dressing: Total assistance, Bed level Lower Body Dressing Details (indicate cue type and reason): to don new sock Toilet Transfer: +2 for physical assistance, +2 for safety/equipment, Ambulation, Moderate assistance Toilet Transfer Details (indicate cue type and reason): simulated via functional mobility; Mod A +2 for functional mobility with pt needing max cues to sequence BOS and initiate pivotal steps, pt greatly limited by pain in low back. Unable to advance RW and sequence to date Toileting- Architect and Hygiene: Moderate assistance, Sitting/lateral lean, Sit to/from stand Toileting - Clothing Manipulation Details (indicate cue  type and reason): pt also has difficulty with urinary retention limiting BADL participation unless regularly catheterized Functional mobility during ADLs: +2 for safety/equipment, +2 for physical assistance, Cueing for sequencing, Cueing for safety, Moderate assistance General ADL Comments: Educated pt's husband and son on bed mobility, transfers, use of gait belt, back precautions, how they will need to A her with bathing, dressing, toileting, how to doff/donn brace--had family return demonstrate all but ADLs. Also made them aware of using the 3n1 as a shower seat     Mobility   Overal bed mobility: Needs Assistance Bed Mobility: Rolling, Sit to Sidelying Rolling: Min assist Sidelying to sit: Min assist Sit to sidelying: Mod assist General bed mobility comments: VCs for sequencing     Transfers   Overall transfer level: Needs assistance Equipment used: Rolling walker (2 wheeled) Transfers: Sit to/from Stand, Stand Pivot Transfers Sit to Stand: Min assist Stand pivot transfers: Mod assist General transfer comment: max verbal cues to push up from chair not pull up on walker, increased time, modA to steady during transition of hands from chair to walker     Ambulation / Gait / Stairs / Wheelchair Mobility   Ambulation/Gait Ambulation/Gait assistance: Mod assist, +2 safety/equipment (chair follow) Gait Distance (Feet): 15 Feet Assistive device: Rolling walker (2 wheeled) Gait Pattern/deviations: Step-to pattern, Decreased stride length, Decreased stance time - left, Decreased step length - left General Gait Details: pt with short step length and decreased foot clearance on L foot, very dependent on RW. Pt with labored effort Gait velocity: dec Gait velocity interpretation: <1.8 ft/sec, indicate of risk for recurrent falls Stairs: Yes Stairs assistance: +2 physical assistance, Max assist Stair Management: One rail Left, Step to pattern, Sideways, Forwards Number of Stairs: 2 General  stair comments: attempted to ascend stairs sideways leading with R UE (stronger foot) however pt unable to complete due to unable to push up enough to clear L foot without maximal assist. Then attempted to ascend forward with L handrail and PT providing HHA on R to mimic other hands rail. Pt required maxA to achieve R knee extension and then modA to bring L foot up on step. Pt required to sit on PTs knee due to pain and weakness. pt assisted to chair with maxAx2, called spouse and he said "my son and I will just carry her in"     Posture / Balance Dynamic Sitting Balance Sitting balance - Comments: Using Bil UEs to support self EOB (to help with pain) Balance Overall balance assessment: Needs assistance Sitting-balance support: Bilateral upper extremity supported, Feet supported Sitting balance-Leahy Scale: Poor Sitting balance - Comments: Using Bil UEs to support self EOB (to help with pain) Postural control: Right lateral lean Standing balance support: Bilateral upper extremity supported, During functional activity Standing balance-Leahy Scale: Poor Standing balance comment: dependent on RW and external support     Special  needs/care consideration Continuous Drip IV: cefazolin, lactated ringers infusion, Skin: ecchymosis to arm; back (right; left), surgical incision to mid back and right shoulder   Behavioral consideration: restless   Special service needs: will need to be on IV antibiotics at home-Pam [place] with [facility] familiar with case.    and Designated visitor: Bethann Berkshire (husband).         Previous Home Environment (from acute therapy documentation) Living Arrangements: Spouse/significant other Available Help at Discharge: Family Type of Home: House Home Layout: One level Home Access: Stairs to enter Entrance Stairs-Rails: Right, Left, Can reach both Entrance Stairs-Number of Steps: 2 Bathroom Shower/Tub: Tub/shower unit, Health visitor:  Standard Home Care Services: No   Discharge Living Setting Plans for Discharge Living Setting: Patient's home, Lives with (comment) (lives with husband and 31 yo son. ) Type of Home at Discharge: House Discharge Home Layout: One level Discharge Home Access: Stairs to enter Entrance Stairs-Rails: Can reach both Entrance Stairs-Number of Steps: 2 Discharge Bathroom Shower/Tub: Tub only, Walk-in shower Discharge Bathroom Toilet: Standard Discharge Bathroom Accessibility: Yes How Accessible: Accessible via walker Does the patient have any problems obtaining your medications?: No   Social/Family/Support Systems Patient Roles: Spouse Contact Information: husband: Bethann Berkshire [phone] Anticipated Caregiver: husband (when home from work) + 4 yo son  Anticipated Industrial/product designer Information: see above for details Ability/Limitations of Caregiver: Min A Caregiver Availability: 24/7 Discharge Plan Discussed with Primary Caregiver: Yes (pt and her husband) Is Caregiver In Agreement with Plan?: Yes Does Caregiver/Family have Issues with Lodging/Transportation while Pt is in Rehab?: No     Goals Patient/Family Goal for Rehab: PT/OT/SLP: Min A Expected length of stay: 12-16 days Pt/Family Agrees to Admission and willing to participate: Yes Program Orientation Provided & Reviewed with Pt/Caregiver Including Roles  & Responsibilities: Yes (with pt and her husband)  Barriers to Discharge: Home environment access/layout, IV antibiotics, Insurance for SNF coverage, Weight bearing restrictions  Barriers to Discharge Comments: steps to enter home; IV antibiotics at DC, uninsured.      Decrease burden of Care through IP rehab admission: NA     Possible need for SNF placement upon discharge:Not anticipated. Pt has good family support at DC and family can provide the anticipated assist level (Min A). They understand the plan is to DC home after CIR stay. As pt is uninsured, she would be a difficult  to place patient if unable to transition home. Family aware they will have to pay for her continued antibiotics and RN care at DC.      Patient Condition: This patient's medical and functional status has changed since the consult dated: 04/25/20 in which the Rehabilitation Physician determined and documented that the patient's condition is appropriate for intensive rehabilitative care in an inpatient rehabilitation facility. See "History of Present Illness" (above) for medical update. Functional changes are: progression in transfer status from Mod A +2 to Mod A, initiation of gait of up to 15 feet with Mod A +2, and OT has completed evaluation revealing CIR needs. Patient's medical and functional status update has been discussed with the Rehabilitation physician and patient remains appropriate for inpatient rehabilitation. Will admit to inpatient rehab today.   Preadmission Screen Completed By:  Debra Schmidt, OT, 05/09/2020 11:42 AM ______________________________________________________________________   Discussed status with Dr. Berline Chough on 05/09/20 at 11:40AM and received approval for admission today.   Admission Coordinator:  Debra Schmidt, time 11:40AM Dorna Bloom 05/09/20.  Cosigned by: Genice Rouge, MD at 05/09/2020 11:59 AM  Revision History Note Details  Author Debra Schmidt, OT File Time 05/09/2020 11:42 AM  Author Type Rehab Admission Coordinator Status Signed  Last Editor Debra Schmidt, OT Service Physical Medicine and Rehabilitation  Hospital Acct # [id number] Admit Date 05/09/2020

## 2020-05-09 NOTE — Progress Notes (Signed)
Occupational Therapy Treatment Patient Details Name: Debra Schmidt MRN: [id number] DOB: [birth year] Today's Date: 05/09/2020    History of present illness Pt is a 59 y.o. female admitted on 04/23/20 for emergent decompression of throacolumbar epidural abcess, discitis L1/2 s/p laminectomy L1/2 and evacuation of abcess.  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 She is now s/p I&D and shoulder, AC joint repair. Infectious disease is following suspicious that both R shoulder and back are staph infections.  Blood cultures are pending.  Pt with no significant medical history listed in chart.   OT comments  Pt received seated in recliner requesting to return to bed. Pt requires MIN - MOD A for functional mobility with RW as pt continues to present with impaired balance, impaired proprioception decreased safety awareness and poor insight into deficits impacting pts ability to engage in BADLs. Pt required MAX A to don brace EOB and MIN A for bed mobility. Will follow for OT needs.   Follow Up Recommendations  Home health OT;Supervision/Assistance - 24 hour    Equipment Recommendations  3 in 1 bedside commode    Recommendations for Other Services      Precautions / Restrictions Precautions Precautions: Fall;Back Precaution Booklet Issued: Yes (comment) Precaution Comments: pt initially able to state 3/3 precautions but cannot recall by end of session. when asked if pt is allowed to bend , lift or twist pt able to accurately answer Required Braces or Orthoses: Spinal Brace Spinal Brace: Lumbar corset;Applied in sitting position Restrictions Weight Bearing Restrictions: Yes RUE Weight Bearing: Weight bearing as tolerated Other Position/Activity Restrictions: no overhead movement RUE       Mobility Bed Mobility   Bed Mobility: Sit to Sidelying;Rolling Rolling: Min assist       Sit to sidelying: Min assist General bed mobility comments: MIN needed  for cues to maintain log roll position to completely roll into supine from sidelying  Transfers Overall transfer level: Needs assistance Equipment used: Rolling walker (2 wheeled) Transfers: Sit to/from [employer] Sit to Stand: Min assist Stand pivot transfers: Min assist;Mod assist       General transfer comment: pt requires cues every trial for hand placment; MIN - MOD A for stand pivot transfer d/t impaired proprioception as pt attempting to sit before close to EOB    Balance Overall balance assessment: Needs assistance Sitting-balance support: Bilateral upper extremity supported;Feet supported Sitting balance-Leahy Scale: Poor Sitting balance - Comments: Using Bil UEs to support self EOB (to help with pain) Postural control: Right lateral lean Standing balance support: Bilateral upper extremity supported;During functional activity Standing balance-Leahy Scale: Poor Standing balance comment: dependent on RW and external support                           ADL either performed or assessed with clinical judgement   ADL Overall ADL's : Needs assistance/impaired                 Upper Body Dressing : Maximal assistance;Sitting Upper Body Dressing Details (indicate cue type and reason): to don brace EOB     Toilet Transfer: Minimal assistance;RW;Stand-pivot Toilet Transfer Details (indicate cue type and reason): simulated via functional mobilityl stand pivot back to bed with RW; MIN A for balacne and RW mgmt pt with impaired propriocpetion throuhgout session attempting to sit before close to bed         Functional mobility during ADLs: Minimal assistance;Rolling  walker;Cueing for sequencing;Cueing for safety General ADL Comments: pt continues to present with decreased carryover of precautions, impaired balance and pain impacting pts ability to engage in BADls     Vision Patient Visual Report: No change from baseline     Perception      Praxis      Cognition Arousal/Alertness: Awake/alert Behavior During Therapy: WFL for tasks assessed/performed Overall Cognitive Status: Impaired/Different from baseline (unsure of baseline cognition) Area of Impairment: Memory;Safety/judgement;Following commands;Awareness;Problem solving                   Current Attention Level: Sustained Memory: Decreased short-term memory;Decreased recall of precautions Following Commands: Follows multi-step commands inconsistently;Follows one step commands with increased time;Follows one step commands inconsistently Safety/Judgement: Decreased awareness of safety;Decreased awareness of deficits Awareness: Emergent Problem Solving: Slow processing;Decreased initiation;Difficulty sequencing;Requires verbal cues;Requires tactile cues General Comments: pt initially with good awareness of precautions but cannot restate at end of session        Exercises Exercises: General Lower Extremity General Exercises - Lower Extremity Ankle Circles/Pumps: AROM;10 reps;Both Long Arc Quad: Both;10 reps;Seated   Shoulder Instructions       General Comments insision appears clean and dry; wrote back precautions down for pt    Pertinent Vitals/ Pain       Pain Assessment: Faces Faces Pain Scale: Hurts little more Pain Location: back Pain Descriptors / Indicators: Sore Pain Intervention(s): Limited activity within patient's tolerance;Monitored during session;Repositioned;Patient requesting pain meds-RN notified  Home Living                                          Prior Functioning/Environment              Frequency  Min 2X/week        Progress Toward Goals  OT Goals(current goals can now be found in the care plan section)  Progress towards OT goals: Progressing toward goals  Acute Rehab OT Goals Patient Stated Goal: to not be in so much pain OT Goal Formulation: With patient Time For Goal Achievement:  05/10/20 Potential to Achieve Goals: Fair  Plan Discharge plan remains appropriate;Frequency remains appropriate    Co-evaluation                 AM-PAC OT "6 Clicks" Daily Activity     Outcome Measure   Help from another person eating meals?: None Help from another person taking care of personal grooming?: A Little Help from another person toileting, which includes using toliet, bedpan, or urinal?: A Lot Help from another person bathing (including washing, rinsing, drying)?: A Lot Help from another person to put on and taking off regular upper body clothing?: A Little Help from another person to put on and taking off regular lower body clothing?: A Lot 6 Click Score: 16    End of Session Equipment Utilized During Treatment: Gait belt;Back brace;Rolling walker  OT Visit Diagnosis: Unsteadiness on feet (R26.81);Other abnormalities of gait and mobility (R26.89);Muscle weakness (generalized) (M62.81);Pain   Activity Tolerance Patient tolerated treatment well   Patient Left in bed;with call bell/phone within reach;with bed alarm set   Nurse Communication Mobility status;Patient requests pain meds        Time: 7035-0093 OT Time Calculation (min): 13 min  Charges: OT General Charges $OT Visit: 1 Visit OT Treatments $Self Care/Home Management : 8-22 mins  Pollyann Glen C., COTA/L Acute Rehabilitation  Services [phone]    Angelina Pih 05/09/2020, 4:43 PM

## 2020-05-09 NOTE — Discharge Summary (Signed)
Physician Discharge Summary  Patient ID: Debra Schmidt MRN: [id number] DOB/AGE: [birth year] 59 y.o.  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   Treatments: surgery: lumbar laminectomy for abscess evacuation L1/2  Open debridement R AC joint; Distal clavicle repair, ACROMIO-CLAVICULAR JOINT REPAIR, RESECTION DISTAL CLAVICAL  Discharge Exam: Blood pressure 126/83, pulse (!) 102, temperature 98 F (36.7 C), temperature source Oral, resp. rate 20, height 5\' 4"  (1.626 m), weight 62.6 kg, SpO2 98 %. General appearance: alert, cooperative, appears older than stated age and moderate distress weakness bilateral lower extremities, weakness right shoulder  Disposition: Discharge disposition: [facility]-Another [facility] Care Institution Not Defined      Right AC joint septic arthritis w/osteo  Allergies as of 05/09/2020      Reactions   Penicillins Hives   Tolerated Zosyn and Cefazolin 04/2020      Medication List    STOP taking these medications   methocarbamol 500 MG tablet Commonly known as: ROBAXIN   Stool Softener 100 MG capsule Generic drug: docusate sodium     TAKE these medications   ibuprofen 200 MG tablet Commonly known as: ADVIL Take 200-800 mg by mouth every 6 (six) hours as needed for moderate pain.   phenazopyridine 95 MG tablet Commonly known as: PYRIDIUM Take 95 mg by mouth 3 (three)  times daily as needed for pain.   potassium chloride SA 20 MEQ tablet Commonly known as: KLOR-CON Take 1 tablet (20 mEq total) by mouth 2 (two) times daily.            Durable Medical Equipment  (From admission, onward)         Start     Ordered   05/05/20 0958  For home use only DME Walker rolling  Once       Question Answer Comment  Walker: With 5 Inch Wheels   Patient needs a walker to treat with the following condition Weakness      05/05/20 0957   05/05/20 0957  For home use only DME 3 n 1  Once        05/05/20 0957          Follow-up Information    05/07/20, MD In 2 weeks.   Specialty: Orthopedic Surgery Contact information: [address] [place] [phone]        [facility] [phone] Follow up.   Specialty: General Surgery Why: Follow up to discuss gallbladder removal due to gallstones and recurrent pancreatitis Contact information: [address] [place] [phone]        [facility]. Go on 05/23/2020.   Why: @ 9:30a for hospital follow-up Contact information: [address] [place] [phone]       [facility] Follow up.   Why: rolling walker and 3n1 arranged- to be delivered to room prior to discharge  Contact information: [address] [place] [place] [address] [phone]        Coletta Memos, MD Follow up in 2 week(s).   Specialty: Neurosurgery Why: after discharge from rehab Contact information: [address] [place] [address] [phone]               Signed: Coletta Memos 05/09/2020, 3:24 PM

## 2020-05-09 NOTE — Progress Notes (Signed)
Patient admitted to unit. Oriented to room, call light, and plan of care for shift. PA at bedside. Medicated for pain and spasm per MAR. Instructed to call for assistance to get oob for safety. Pt v/u. No needs noted or verbalized at this time.

## 2020-05-09 NOTE — Progress Notes (Signed)
Horton Chin, MD  Physician  Physical Medicine and Rehabilitation  Consult Note      Signed  Date of Service:  04/25/2020  8:24 AM      Related encounter: ED to Hosp-Admission (Discharged) from 04/23/2020 in  [facility]      Signed      Expand All Collapse All  Show:Clear all [x] Manual[x] Template[] Copied  Added by: [x] Love, , PA-C[x] Raulkar, , MD  [] Hover for details          Physical Medicine and Rehabilitation Consult     Reason for Consult: Functional deficits due to epidural abscess Referring Physician: Dr. Evlyn Kanner     HPI: Debra Schmidt is a 59 y.o. female with history of tobacco use and back pain with progression x72 hours with BLE weakness, constipation and bladder incontinence who was admitted via ED on 04/23/2020.  Potassium 2.0 at admission with elevated BNP and urinary retention with return of 1000 cc urine with Foley placement.  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 46.    Hypomagnesemia/hypokalemia aggressively supplemented.  UDS negative and patient with distant history of IVDU.  She reported pain right shoulder and was noted to have purulent drainage. CT shoulder done and revealed showed soft tissue ulceration on top of shoulder with underlying AC septic arthritis and osteomyelitis of the acromion and distal clavicle with small amount of fluid in  subacromial/subdeltoid bursa.  Dr. 04/25/2020 consulted for input on antibiotic regimen and recommended vancomycin alone pending final culture results.  Dr. Marland Kitchen consulted and plans for patient to undergo I&D distal clavicle/acromion.  Therapy evaluations completed revealing deficits in mobility and ADLS. CIR recommended due to functional deficits.      Review of Systems  Unable to perform ROS: Other  Constitutional: Negative for chills and fever.  HENT: Negative for hearing loss.   Eyes: Negative for blurred vision and double vision.  Respiratory: Negative for cough and shortness of breath.   Cardiovascular: Negative for chest pain and palpitations.  Gastrointestinal: Positive for abdominal pain and constipation (no BM X one week).      History reviewed. No pertinent past medical history.      Past Surgical History:  Procedure Laterality Date  . LUMBAR LAMINECTOMY/DECOMPRESSION MICRODISCECTOMY N/A 04/23/2020    Procedure: Lumbar One-Two LAMINECTOMY for Epidural Abscess Evacuation;  Surgeon: Orvan Falconer, MD;  Location: [facility];  Service: Neurosurgery;  Laterality: N/A;           Family History  Problem Relation Age of Onset  . Cancer Mother    . Cancer Father        Social History:  Married. Has been unemployed for a couple of years. She reports that she has been smoking cigarettes. She has been smoking about 0.50 packs per day. She has never used smokeless tobacco. She reports previous alcohol use. She occasional marijuana use and IV drug use few years ago.           Allergies  Allergen Reactions  . Penicillins  Hives            Medications Prior to Admission  Medication Sig Dispense Refill  . ibuprofen (ADVIL) 200 MG tablet Take 200-800 mg by mouth every 6 (six) hours as needed for moderate pain.      . phenazopyridine (PYRIDIUM) 95 MG tablet Take 95 mg by mouth 3 (three) times daily as needed for pain.      Marland Kitchen docusate sodium (STOOL SOFTENER) 100 MG capsule Take 100 mg by  mouth daily as needed for mild constipation. (Patient not taking: Reported on 04/23/2020)      . methocarbamol (ROBAXIN) 500 MG tablet Take 1 tablet (500 mg total) by mouth every 8 (eight) hours as needed for muscle spasms. (Patient not taking: Reported on 04/23/2020) 10 tablet 0  . potassium chloride SA (KLOR-CON) 20 MEQ tablet Take 1 tablet (20 mEq total) by mouth 2 (two) times daily. (Patient not taking: Reported on 04/23/2020) 10 tablet 0      Home: Home Living Family/patient expects to be discharged to:: Private residence Living Arrangements: Spouse/significant other (husband works) Available Help at Discharge: Family Type of Home: House Home Access: Stairs to enter Secretary/administrator of Steps: 2 Entrance Stairs-Rails: Right, Left, Can reach both Home Layout: One level Bathroom Shower/Tub: Tub/shower unit, Health visitor: Standard Home Equipment: Environmental consultant - 4 wheels  Functional History: Prior Function Level of Independence: Needs assistance Gait / Transfers Assistance Needed: per pt, she was ambulating with rollator for the past two weeks, fell and scraped her R shoulder.  Functional Status:  Mobility: Bed Mobility Overal bed mobility: Needs Assistance Bed Mobility: Rolling, Sidelying to Sit Rolling: Min assist Sidelying to sit: Mod assist General bed mobility comments: Verbally reviewed log roll, assisted pt in flexion bil knees and rolling to her side, mod assist to come up to sitting on the side of the bed.  Transfers Overall transfer level: Needs assistance Equipment used: Rolling walker (2 wheeled), 2 person hand held assist Transfers: Sit to/from Stand, Stand Pivot Transfers Sit to Stand: +2 physical assistance, Mod assist Stand pivot transfers: +2 physical assistance, Mod assist General transfer comment: Stood twice EOB, pt unable to maintain for very long before sitting again, did not do well with stepping and side stepping in standing, so moved RW out  of the way for pivotal steps to the chair with two person hand held assist.  Ambulation/Gait General Gait Details: Not safe to attempt at this time.    ADL:   Cognition: Cognition Overall Cognitive Status: No family/caregiver present to determine baseline cognitive functioning Orientation Level: Oriented X4 Cognition Arousal/Alertness: Awake/alert Behavior During Therapy: Restless Overall Cognitive Status: No family/caregiver present to determine baseline cognitive functioning General Comments: Pt restless in the bed, but answering all of my PLOF/Hx questions for me, a bit lethargic, but that could be meds?     Blood pressure 103/89, pulse 87, temperature 98.5 F (36.9 C), temperature source Oral, resp. rate (!) 30, height  (1.626 m), weight 62.6 kg, SpO2 97 %.   Physical Exam General: Sedated HEENT: Head is normocephalic, atraumatic, PERRLA, EOMI, sclera anicteric, oral mucosa pink and moist, poor dentition Neck: Supple without JVD or lymphadenopathy Heart: Reg rate and rhythm. No murmurs rubs or gallops Chest: CTA bilaterally without wheezes, rales, or rhonchi; no distress GI: She exhibits distension. There is abdominal tenderness.  Extremities: No clubbing, cyanosis, or edema. Pulses are 2+ Skin: Clean and intact without signs of breakdown Neuro: Sedated and needed max cues to  answer/participate in exam. Musculoskeletal:  Posture slumped. Full neck ROM.  Psych: Sedated   Lab Results Last 24 Hours       Results for orders placed or performed during the hospital encounter of 04/23/20 (from the past 24 hour(s))  Phosphorus     Status: None    Collection Time: 04/24/20 10:39 AM  Result Value Ref Range    Phosphorus 3.8 2.5 - 4.6 mg/dL  Phosphorus     Status: Abnormal    Collection Time: 04/25/20  5:28 AM  Result Value Ref Range    Phosphorus 2.3 (L) 2.5 - 4.6 mg/dL  CBC     Status: Abnormal    Collection Time: 04/25/20  7:54 AM  Result Value Ref Range    WBC 14.2  (H) 4.0 - 10.5 K/uL    RBC 3.26 (L) 3.87 - 5.11 MIL/uL    Hemoglobin 8.9 (L) 12.0 - 15.0 g/dL    HCT 04.527.6 (L) 36 - 46 %    MCV 84.7 80.0 - 100.0 fL    MCH 27.3 26.0 - 34.0 pg    MCHC 32.2 30.0 - 36.0 g/dL    RDW 40.915.3 81.111.5 - 91.415.5 %    Platelets 347 150 - 400 K/uL    nRBC 0.0 0.0 - 0.2 %       Imaging Results (Last 48 hours)  DG Thoracolumabar Spine   Result Date: 04/24/2020 CLINICAL DATA:  L1-2 laminectomy EXAM: THORACOLUMBAR SPINE 1V COMPARISON:  Intraoperative imaging earlier FINDINGS: Single cross-table lateral view of the lumbar spine demonstrates posterior surgical instruments directed at the L1-2 level. IMPRESSION: Intraoperative localization as above. Electronically Signed   By: Charlett NoseKevin  Dover M.D.   On: 04/24/2020 01:11    DG Thoracolumabar Spine   Result Date: 04/23/2020 CLINICAL DATA:  Elective surgery EXAM: THORACOLUMBAR SPINE 1V COMPARISON:  None. FINDINGS: Cross-table lateral views of the lumbar spine are submitted. Final image demonstrates posterior surgical instruments directed at the T12-L1 level. IMPRESSION: Intraoperative localization as above. Electronically Signed   By: Charlett NoseKevin  Dover M.D.   On: 04/23/2020 23:30    MR THORACIC SPINE WO CONTRAST   Addendum Date: 04/23/2020   ADDENDUM REPORT: 04/23/2020 18:36 ADDENDUM: Critical findings discussed by telephone with Dr. Gwyneth SproutWHITNEY PLUNKETT on 04/23/2020 at 18:26. Electronically Signed   By: Odessa FlemingH  Hall M.D.   On: 04/23/2020 18:36    Result Date: 04/23/2020 CLINICAL DATA:  59 year old female with low back pain, urinary incontinence. EXAM: MRI THORACIC SPINE WITHOUT CONTRAST TECHNIQUE: Multiplanar, multisequence MR imaging of the thoracic spine was performed. No intravenous contrast was administered. COMPARISON:  Lumbar MRI today reported separately. CT Abdomen and Pelvis 02/24/2020. FINDINGS: Limited cervical spine imaging:  Negative. Thoracic spine segmentation: Appears to be normal, and concordant with the lumbar numbering today.  Alignment: Mildly exaggerated thoracic kyphosis. No spondylolisthesis. Vertebrae: No acute osseous abnormality identified in the thoracic spine, thoracic marrow signal appears to remain normal. Abnormal L1 and L2 levels, detailed separately today. Cord:  Axial images degraded by motion. Abnormal thoracic spinal cord T2 and STIR hyperintensity within the central cord beginning at the T8 vertebral level (series 5, image 12, series 8, image 31) and continuing to the cauda equina. Mildly abnormal central cord signal may extend as high as the T4 level, but above that the cord appears within normal limits. Superimposed bulky abnormal intraspinal T2 and STIR hyperintense signal in the lower thoracic spinal canal beginning at T10-T11 (series 6, image 10) and continuing into the lumbar spinal canal. Axial images  through the segments are degraded by motion but the cord appears primarily displaced and compressed posteriorly by the bulky fluid signal space-occupying mass (series 12, image 29). The dorsal thoracic epidural space is probably abnormal as high as the T9 level. Paraspinal and other soft tissues: The lower thoracic paraspinal soft tissues become abnormal at the T11-T12 level, with STIR hyperintensity compatible with inflammation. Abnormality continues into the lumbar spine reported separately. Grossly negative visible thoracic and upper abdominal viscera. Disc levels: Mild thoracic spine degeneration with the exception of T8-T9 where disc desiccation and disc space loss are accompanied by disc bulging. No spinal or foraminal stenosis associated. IMPRESSION: 1. Positive for compression of the lower thoracic spinal cord and conus by what appears to be a bulky multiloculated intraspinal fluid collection emanating from the abnormal lumbar L1-L2 level (detailed separately). 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 2. Severe abnormal signal in the spinal cord - especially from T8  inferiorly - could be cord edema from compression, but consider also Venous Cord Infarction from septic spinal venous thrombophlebitis. 3. Recommend Neurosurgery, Neurology, and Infectious Disease consultation. Electronically Signed: By: Odessa Fleming M.D. On: 04/23/2020 18:20    MR LUMBAR SPINE WO CONTRAST   Result Date: 04/23/2020 CLINICAL DATA:  59 year old female with low back pain, urinary incontinence. EXAM: MRI LUMBAR SPINE WITHOUT CONTRAST TECHNIQUE: Multiplanar, multisequence MR imaging of the lumbar spine was performed. No intravenous contrast was administered. COMPARISON:  CT Abdomen and Pelvis 03/25/2020. Thoracic spine MRI today reported separately. FINDINGS: Segmentation: Normal, concordant with the thoracic spine numbering today. Alignment: Stable lumbar lordosis, subtle new retrolisthesis of L2 on L3. Vertebrae: Confluent vertebral body edema at L2 and L3 with intervening abnormal disc space with fluid and endplate erosions. The posterior elements of these levels appear relatively spared although there is abnormal facet joint fluid bilaterally, which is not seen at the adjacent spinal levels. No other marrow edema or evidence of acute osseous abnormality. Intact visible sacrum and SI joints. Conus medullaris and cauda equina: Highly abnormal as described on the thoracic MRI today. Axial images on this exam are also degraded by motion, but the abnormal cord can be seen displaced dorsally beginning at the T11 level (series 3, image 7). The conus medullaris is difficult to delineate. And there is ventral (lower thoracic) and complete (L1 and lower) filling of the spinal canal by multiloculated appearing T2 and STIR hyperintense material which is likely abscess. Severe spinal stenosis is present from L1 through L4. Possibly more normal appearance of the thecal sac beginning at L5, although the cauda equina nerve roots there are difficult to delineate. Paraspinal and other soft tissues: Positive for lateral  paraspinal abscesses beginning at the T12 level and maximal at L1-L2-greater on the right (series 3, image 3). The individual paraspinal abscesses are up to 24 mm. Associated edema in the bilateral upper lumbar psoas muscles, which become more normal by the L4 level. Grossly negative visible abdominal viscera. Posteriorly there is bilateral erector spinae muscle edema, although no posterior paraspinal abscess. Disc levels: Outside of L1-L2, the intervertebral discs appear normal except for chronic degeneration at L3-L4 and L4-L5. There is superimposed degenerative bilateral lumbar facet hypertrophy, maximal at L4-L5 where facet joint fluid is also noted. IMPRESSION: 1. 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 Septic arthritis of the L1-L2 facets is also possible. 2. 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 3. Superimposed lateral paraspinal soft tissue abscesses, greater on the right maximal at L1-L2 and individually up to 2.4 cm. Salient findings discussed by telephone with Dr. Gwyneth Sprout in the ED on 04/23/2020 at 18:28 . Electronically Signed   By: Odessa Fleming M.D.   On: 04/23/2020 18:34    CT SHOULDER RIGHT W CONTRAST   Result Date: 04/24/2020 CLINICAL DATA:  Fall several weeks ago with right shoulder wound now with surrounding redness and purulent drainage. Admitted with L1-L2 osteomyelitis-discitis and large epidural abscess. EXAM: CT OF THE UPPER RIGHT EXTREMITY WITH CONTRAST TECHNIQUE: Multidetector CT imaging of the right shoulder was performed according to the standard protocol following intravenous contrast administration. COMPARISON:  None. CONTRAST:  31mL OMNIPAQUE IOHEXOL 300 MG/ML  SOLN FINDINGS: Bones/Joint/Cartilage Erosive changes of the distal clavicle and acromion. Acromioclavicular joint effusion with  distention of the joint capsule. No fracture or dislocation. No glenohumeral joint effusion. Small amount of fluid in the subacromial/subdeltoid bursa. Ligaments Ligaments are suboptimally evaluated by CT. Muscles and Tendons Grossly intact.  No muscle atrophy. Soft tissue Soft tissue ulceration on the top of the shoulder with underlying inflammatory changes extending to the acromioclavicular joint. No discrete fluid collection. Small reactive right axillary lymph nodes. IMPRESSION: 1. Soft tissue ulceration on the top of the shoulder with underlying acromioclavicular septic arthritis and osteomyelitis of the acromion and distal clavicle. 2. Small amount of fluid in the subacromial/subdeltoid bursa. Septic bursitis is not excluded. 3. No glenohumeral erosive changes or joint effusion. Electronically Signed   By: Obie Dredge M.D.   On: 04/24/2020 10:15    ECHOCARDIOGRAM COMPLETE   Result Date: 04/24/2020    ECHOCARDIOGRAM REPORT   Patient Name:   TALISHIA BETZLER Date of Exam: 04/24/2020 Medical Rec #:  [id number]      Height:       64.0 in Accession #:    [id number]     Weight:       138.0 lb Date of Birth:  [birth year]     BSA:          1.671 m Patient Age:    58 years       BP:           177/93 mmHg Patient Gender: F              HR:           87 bpm. Exam Location:  Inpatient Procedure: 2D Echo, Color Doppler and Cardiac Doppler Indications:    Endocarditis  History:        Patient has no prior history of Echocardiogram examinations.  Sonographer:    Irving Burton Senior RDCS Referring Phys: [id number] MURALI RAMASWAMY IMPRESSIONS  1. Left ventricular ejection fraction, by estimation, is 65 to 70%. The left ventricle has normal function. The left ventricle has no regional wall motion abnormalities. Left ventricular diastolic parameters were normal.  2. Right ventricular systolic function is normal. The right ventricular size is normal. Tricuspid regurgitation signal is inadequate for assessing PA pressure.  3. The mitral  valve is normal in structure. No evidence of mitral valve regurgitation.  4. The aortic valve is tricuspid. Aortic valve regurgitation is not visualized. No aortic stenosis is present.  5. The inferior vena cava is normal in size with <50% respiratory variability, suggesting right atrial pressure of 8 mmHg.  6. No vegetation seen. If high clinical suspicion for endocarditis, consider TEE FINDINGS  Left Ventricle: Left ventricular ejection  fraction, by estimation, is 65 to 70%. The left ventricle has normal function. The left ventricle has no regional wall motion abnormalities. The left ventricular internal cavity size was normal in size. There is  no left ventricular hypertrophy. Left ventricular diastolic parameters were normal. Right Ventricle: The right ventricular size is normal. No increase in right ventricular wall thickness. Right ventricular systolic function is normal. Tricuspid regurgitation signal is inadequate for assessing PA pressure. Left Atrium: Left atrial size was normal in size. Right Atrium: Right atrial size was normal in size. Pericardium: There is no evidence of pericardial effusion. Mitral Valve: The mitral valve is normal in structure. No evidence of mitral valve regurgitation. Tricuspid Valve: The tricuspid valve is normal in structure. Tricuspid valve regurgitation is not demonstrated. Aortic Valve: The aortic valve is tricuspid. Aortic valve regurgitation is not visualized. No aortic stenosis is present. Pulmonic Valve: The pulmonic valve was not well visualized. Pulmonic valve regurgitation is not visualized. Aorta: The aortic root and ascending aorta are structurally normal, with no evidence of dilitation. Venous: The inferior vena cava is normal in size with less than 50% respiratory variability, suggesting right atrial pressure of 8 mmHg. IAS/Shunts: The interatrial septum was not well visualized.  LEFT VENTRICLE PLAX 2D LVIDd:         4.40 cm  Diastology LVIDs:         2.45 cm  LV e'  lateral:   10.90 cm/s LV PW:         0.90 cm  LV E/e' lateral: 11.6 LV IVS:        0.90 cm  LV e' medial:    10.60 cm/s LVOT diam:     1.90 cm  LV E/e' medial:  11.9 LV SV:         62 LV SV Index:   37 LVOT Area:     2.84 cm  RIGHT VENTRICLE RV S prime:     10.90 cm/s TAPSE (M-mode): 2.4 cm LEFT ATRIUM             Index       RIGHT ATRIUM           Index LA diam:        3.20 cm 1.92 cm/m  RA Area:     10.70 cm LA Vol (A2C):   35.5 ml 21.25 ml/m RA Volume:   19.90 ml  11.91 ml/m LA Vol (A4C):   49.5 ml 29.62 ml/m LA Biplane Vol: 42.6 ml 25.49 ml/m  AORTIC VALVE LVOT Vmax:   120.00 cm/s LVOT Vmean:  70.000 cm/s LVOT VTI:    0.220 m  AORTA Ao Root diam: 3.10 cm Ao Asc diam:  3.20 cm MITRAL VALVE MV Area (PHT): 3.99 cm     SHUNTS MV Decel Time: 190 msec     Systemic VTI:  0.22 m MV E velocity: 126.00 cm/s  Systemic Diam: 1.90 cm MV A velocity: 102.00 cm/s MV E/A ratio:  1.24 Epifanio Lesches MD Electronically signed by Epifanio Lesches MD Signature Date/Time: 04/24/2020/6:44:41 PM    Final          Assessment/Plan: Diagnosis: Severe thoraco-lumbar and right shoulder infections secondary to MSSA bacteremia 1. Does the need for close, 24 hr/day medical supervision in concert with the patient's rehab needs make it unreasonable for this patient to be served in a less intensive setting? Yes 2. Co-Morbidities requiring supervision/potential complications: lumbar epidural abscess, septic arthritis of right AC joint, osteomyelitis of clavicle, hypokalemia, hypomagnesemia, cigarette smoker  3. Due to bladder management, bowel management, safety, skin/wound care, disease management, medication administration, pain management and patient education, does the patient require 24 hr/day rehab nursing? Yes 4. Does the patient require coordinated care of a physician, rehab nurse, therapy disciplines of PT, OT, SLP to address physical and functional deficits in the context of the above medical diagnosis(es)?  Yes Addressing deficits in the following areas: balance, endurance, locomotion, strength, transferring, bowel/bladder control, bathing, dressing, feeding, grooming, toileting, cognition, speech, language, swallowing and psychosocial support 5. Can the patient actively participate in an intensive therapy program of at least 3 hrs of therapy per day at least 5 days per week? Yes 6. The potential for patient to make measurable gains while on inpatient rehab is excellent 7. Anticipated functional outcomes upon discharge from inpatient rehab are min assist  with PT, min assist with OT, min assist with SLP. 8. Estimated rehab length of stay to reach the above functional goals is: 12-16 days 9. Anticipated discharge destination: Home 10. Overall Rehab/Functional Prognosis: excellent   RECOMMENDATIONS: This patient's condition is appropriate for continued rehabilitative care in the following setting: CIR Patient has agreed to participate in recommended program. N/A Note that insurance prior authorization may be required for reimbursement for recommended care.   Comment: Mrs. Ponti would be a good CIR candidate once medically stable, better able to tolerate therapy, and once home support has been confirmed as she is likely to require assistance upon discharge. Active medical issues: K+ today is 2.7, patient is undergoing shoulder surgery, and is receiving IV morphine for pain control. We will continue to follow in Mrs. Cheetham's care. Thank you for this consult.    Jacquelynn Cree, PA-C 04/25/2020    I have personally performed a face to face diagnostic evaluation, including, but not limited to relevant history and physical exam findings, of this patient and developed relevant assessment and plan.  Additionally, I have reviewed and concur with the physician assistant's documentation above.   Sula Soda, MD        Revision History                     Routing History           Note  Details  Author Carlis Abbott, Drema Pry, MD File Time 04/25/2020  3:19 PM  Author Type Physician Status Signed  Last Editor Horton Chin, MD Service Physical Medicine and Rehabilitation  Hospital Acct # [id number] Admit Date 05/09/2020

## 2020-05-09 NOTE — Discharge Instructions (Signed)
Lumbar Discectomy Care After A discectomy involves removal of discmaterial (the cartilage-like structures located between the bones of the back). It is done to relieve pressure on nerve roots. It can be used as a treatment for a back problem. The time in surgery depends on the findings in surgery and what is necessary to correct the problems. HOME CARE INSTRUCTIONS   Check the cut (incision) made by the surgeon twice a day for signs of infection. Some signs of infection may include:   A foul smelling, greenish or yellowish discharge from the wound.   Increased pain.   Increased redness over the incision (operative) site.   The skin edges may separate.   Flu-like symptoms (problems).   A temperature above 101.5 F (38.6 C).   Change your bandages in about 24 to 36 hours following surgery or as directed.   You may shower tomrrow.  Avoid bathtubs, swimming pools and hot tubs for three weeks or until your incision has healed completely.  Follow your doctor's instructions as to safe activities, exercises, and physical therapy.   Weight reduction may be beneficial if you are overweight.   Daily exercise is helpful to prevent the return of problems. Walking is permitted. You may use a treadmill without an incline. Cut down on activities and exercise if you have discomfort. You may also go up and down stairs as much as you can tolerate.   DO NOT lift anything heavier than 10 to 15 lbs. Avoid bending or twisting at the waist. Always bend your knees when lifting.   Maintain strength and range of motion as instructed.   Do not drive for 10 days, or as directed by your doctors. You may be a passenger . Lying back in the passenger seat may be more comfortable for you. Always wear a seatbelt.   Limit your sitting in a regular chair to 20 to 30 minutes at a time. There are no limitations for sitting in a recliner. You should lie down or walk in between sitting periods.   Only take  over-the-counter or prescription medicines for pain, discomfort, or fever as directed by your caregiver.  SEEK MEDICAL CARE IF:   There is increased bleeding (more than a small spot) from the wound.   You notice redness, swelling, or increasing pain in the wound.   Pus is coming from wound.   You develop an unexplained oral temperature above 102 F (38.9 C) develops.   You notice a foul smell coming from the wound or dressing.   You have increasing pain in your wound.  SEEK IMMEDIATE MEDICAL CARE IF:   You develop a rash.   You have difficulty breathing.   You develop any allergic problems to medicines given.  Document Released: 10/02/2004 Document Revised: 10/17/2011 Document Reviewed: 01/21/2008 ExitCare Patient Information      Right Clavicle No strenuous use right arm.  Sling as needed for comfort. Keep dressings clean dry and in place until follow-up with Dr. Eulah Pont in the office.

## 2020-05-09 NOTE — Progress Notes (Signed)
Physical Therapy Treatment Patient Details Name: Debra Schmidt MRN: [id number] DOB: 1961/02/08 Today's Date: 05/09/2020    History of Present Illness Pt is a 59 y.o. female admitted on 04/23/20 for emergent decompression of throacolumbar epidural abcess, discitis L1/2 s/p laminectomy L1/2 and evacuation of abcess.  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 She is now s/p I&D and shoulder, AC joint repair. Infectious disease is following suspicious that both R shoulder and back are staph infections.  Blood cultures are pending.  Pt with no significant medical history listed in chart.    PT Comments    Pt continue to demonstrate poor recall of back precautions, decreased insight to safety, and poor processing. Pt with significant L sided weakness with difficulty advancing during ambulation limiting ambulation tolerance. Pt reports "I just can't move it, it doesn't really hurt." Pt with poor recall of safe hand placement and walker management despite over a week of being seen daily. Spouse came yesterday and witness patients ability to move and how much assist she required. Spouse agree he can not care for her at home so they are now agreeable with CIR, which is was PT continues to recommend. Acute PT to cont to follow.    Follow Up Recommendations  CIR     Equipment Recommendations  3in1 (PT);Wheelchair (measurements PT);Wheelchair cushion (measurements PT);Rolling walker with 5" wheels    Recommendations for Other Services Rehab consult     Precautions / Restrictions Precautions Precautions: Fall;Back Precaution Booklet Issued: Yes (comment) Precaution Comments: pt able to recall 2/3, pt re-educated; "let me see the piece of paper" (back handout)--but once I gave it to her she did not look at it to see what her back precautions were, she just kept trying to remember them Required Braces or Orthoses: Spinal Brace Spinal Brace: Lumbar corset;Applied  in sitting position Restrictions Weight Bearing Restrictions: Yes RUE Weight Bearing: Weight bearing as tolerated Other Position/Activity Restrictions: no overhead movement RUE    Mobility  Bed Mobility               General bed mobility comments: pt up in chair upon PT arrival  Transfers Overall transfer level: Needs assistance Equipment used: Rolling walker (2 wheeled) Transfers: Sit to/from [employer] Sit to Stand: Min assist         General transfer comment: pt with no recall of safe hand placement/pushing up from chair arm rest despite daily education.   Ambulation/Gait Ambulation/Gait assistance: Mod assist Gait Distance (Feet): 15 Feet Assistive device: Rolling walker (2 wheeled) Gait Pattern/deviations: Step-to pattern;Decreased stride length;Decreased stance time - left;Decreased step length - left Gait velocity: dec Gait velocity interpretation: <1.8 ft/sec, indicate of risk for recurrent falls General Gait Details: pt with difficulty raising and advancing L LE stating "I just doesn't work, Its not that bad from pain"   Stairs             Wheelchair Mobility    Modified Rankin (Stroke Patients Only)       Balance Overall balance assessment: Needs assistance Sitting-balance support: Bilateral upper extremity supported;Feet supported Sitting balance-Leahy Scale: Poor Sitting balance - Comments: Using Bil UEs to support self EOB (to help with pain) Postural control: Right lateral lean Standing balance support: Bilateral upper extremity supported;During functional activity Standing balance-Leahy Scale: Poor Standing balance comment: dependent on RW and external support  Cognition Arousal/Alertness: Awake/alert Behavior During Therapy: WFL for tasks assessed/performed Overall Cognitive Status: Impaired/Different from baseline (unsure of true baseline) Area of Impairment:  Memory;Safety/judgement;Following commands;Awareness;Problem solving                   Current Attention Level: Sustained Memory: Decreased short-term memory;Decreased recall of precautions Following Commands: Follows multi-step commands inconsistently;Follows one step commands with increased time;Follows one step commands inconsistently Safety/Judgement: Decreased awareness of safety;Decreased awareness of deficits Awareness: Emergent Problem Solving: Slow processing;Decreased initiation;Difficulty sequencing;Requires verbal cues;Requires tactile cues General Comments: pt able to recall 2/3 back precautions, pt trying to call her husband using RN call light, instructed pt on how to use phone and dialing 9 prior to dialing number.       Exercises General Exercises - Lower Extremity Ankle Circles/Pumps: AROM;10 reps;Both Long Arc Quad: Both;10 reps;Seated    General Comments General comments (skin integrity, edema, etc.): VSS, pt with dressings over incisions,      Pertinent Vitals/Pain Pain Assessment: 0-10 Faces Pain Scale: Hurts whole lot Pain Location: back Pain Descriptors / Indicators: Sore Pain Intervention(s): Limited activity within patient's tolerance    Home Living                      Prior Function            PT Goals (current goals can now be found in the care plan section) Progress towards PT goals: Not progressing toward goals - comment    Frequency    Min 5X/week      PT Plan Discharge plan needs to be updated    Co-evaluation              AM-PAC PT "6 Clicks" Mobility   Outcome Measure  Help needed turning from your back to your side while in a flat bed without using bedrails?: A Little Help needed moving from lying on your back to sitting on the side of a flat bed without using bedrails?: A Little Help needed moving to and from a bed to a chair (including a wheelchair)?: A Lot Help needed standing up from a chair using  your arms (e.g., wheelchair or bedside chair)?: A Lot Help needed to walk in hospital room?: A Lot Help needed climbing 3-5 steps with a railing? : Total 6 Click Score: 13    End of Session Equipment Utilized During Treatment: Gait belt;Back brace Activity Tolerance: Patient limited by pain Patient left: in chair;with call bell/phone within reach;with chair alarm set Nurse Communication: Mobility status PT Visit Diagnosis: Muscle weakness (generalized) (M62.81);Difficulty in walking, not elsewhere classified (R26.2);Pain;Other symptoms and signs involving the nervous system (R29.898) Pain - Right/Left: Right Pain - part of body: Shoulder     Time: 6503-5465 PT Time Calculation (min) (ACUTE ONLY): 25 min  Charges:  $Gait Training: 8-22 mins $Therapeutic Exercise: 8-22 mins                     Lewis Shock, PT, DPT Acute Rehabilitation Services Pager #: [phone] Office #: [phone]    Debra Schmidt 05/09/2020, 2:29 PM

## 2020-05-09 NOTE — H&P (Signed)
Physical Medicine and Rehabilitation Admission H&P    Chief Complaint  Patient presents with  . Abdominal Pain    HPI: Debra Schmidt is a 59 year old female with history of back pain otherwise in good health who was admitted on 04/23/20 with 72 hours history of worsening of pain with back spasms, urinary incontinence and weakness with difficulty walking. MRI thoracic and lumbar spine done revealing complicated D3-U2 discitis osteomyelitis with severe intraspinal abscess including involvement of lower thoracic spinal cord, abscess related stenosis lower thoracic and lumbar spinal stenosis down to at least L4, lateral paraspinal abscesses beginning at T12 level and maximal at L1/L2 with associated edema in bilateral upper lumbar psoas muscles. UDS negative. She was taken to OR emergently for L1-L2 laminectomy with evacuation of epidural abscess and note made of L1/L3 diskitis by Dr. Christella Noa. She was started on broad spectrum antibiotics and wound culture positive for staph aureus. Patient noted to have right shoulder drainage and Dr. Bobbye Morton recommended local care and shoulder CT to rule out deeper abscess.  Patient reported poorly healing right shoulder wound after a fall 2 weeks PTA. CT shoulder done revealing soft tissue ulceration with underlying AC septic arthritis and osteomyelitis of acromion and distal clavicle. She underwent I & D of AC joint by Dr. Percell Miller on 06/15 with recommendations to avoid strenuous or overhead activity.    2D echo showed EF 65-70% with no wall or valvular  Abnormality.  Dr. Darshawn Boateng Salon recommended at least 6 weeks of IV cefazolin for disseminated MSSA infection--end date 06/03/20. Hospital course significant for intermittent episodes of significant hypokalemia amd hypomagnesemia. . She developed tachycardia with tachypnea on 06/19 and CTA chest was negative for PE but showed acute on chronic  Pancreatitis with fatty liver and GB sludge. She received fluid bolus and made NPO.  Patient also reported abdominal pain and found to have elevated lipase level. Dr. Therisa Doyne recommended supportive care and MRCP done revealing acute on chronic pancreatitis, pancreas divisum, mild biliary dilatation of 1 cm and no definite choledocholithiasis and L1-L2 discitis with osteomyelitis and epidural abscess extending cephalad in the spinal canal and abnormal signal in thoracic spinal cord felt to be similar to those shown in MRI a week prior.   General surgery consulted for input and recommended outpatient follow up for Lap chole in the future. She was kept NPO till symptoms improved and was started on low fat diet on 06/23. Symptoms of pancreatitis have resolved--Lipase down from 140-->91 and she is tolerating diet. Tachycardia has resolved and Norvasc and Lisinopril added due to upward trend in BP. Right shoulder sutures d/c 06/28 with recommendations to follow up with ortho in 2-3 weeks. She has recurrent hypokalemia today requiring increase in supplement.  Pain control improving with addition of Oxycontin. Therapy ongoing but she continues to have issues with processing, STM deficits with poor awareness of deficits affecting mobility and ADLs. CIR recommended due to functional deficits.     Pt reports she had a small BM this AM- it' fell right out of her"- when she transferred to bedside chair.  Has foley since still cannot void/pee.  Says pain is 8/10- saw hadn't had a prn oxycodone since 6am this AM- saw pt at 1pm. RN focusing on giving muscle relaxants, per her.       Review of Systems  Constitutional: Negative for chills and fever.  HENT: Negative for hearing loss.   Eyes: Negative for blurred vision and double vision.  Respiratory: Negative for cough  and shortness of breath.   Cardiovascular: Negative for chest pain and palpitations.  Gastrointestinal: Positive for constipation. Negative for heartburn and nausea.  Genitourinary: Negative for dysuria.  Musculoskeletal: Positive for  back pain and myalgias.       Spasms   Skin: Negative for itching and rash.  Neurological: Positive for weakness. Negative for dizziness, tingling, sensory change and headaches.  Psychiatric/Behavioral: The patient is nervous/anxious. The patient does not have insomnia.   All other systems reviewed and are negative.    History reviewed. No pertinent past medical history.    Past Surgical History:  Procedure Laterality Date  . ACROMIO-CLAVICULAR JOINT REPAIR Right 04/25/2020   Procedure: ACROMIO-CLAVICULAR JOINT REPAIR;  Surgeon: Renette Butters, MD;  Location: [facility];  Service: Orthopedics;  Laterality: Right;  . I & D EXTREMITY Right 04/25/2020   Procedure: Open debridement R AC joint; Distal clavicle repair;  Surgeon: Renette Butters, MD;  Location: [facility];  Service: Orthopedics;  Laterality: Right;  . LUMBAR LAMINECTOMY/DECOMPRESSION MICRODISCECTOMY N/A 04/23/2020   Procedure: Lumbar One-Two LAMINECTOMY for Epidural Abscess Evacuation;  Surgeon: Ashok Pall, MD;  Location: [facility];  Service: Neurosurgery;  Laterality: N/A;  . RESECTION DISTAL CLAVICAL Right 04/25/2020   Procedure: RESECTION DISTAL CLAVICAL;  Surgeon: Renette Butters, MD;  Location: [facility];  Service: Orthopedics;  Laterality: Right;    Family History  Problem Relation Age of Onset  . Cancer Mother   . Cancer Father     Social History:  Leanne Chang with husband and two sons. Independent PTA. Is a Materials engineer. She reports that she has been smoking cigarettes. She has been smoking about 0.50 packs per day. She has never used smokeless tobacco. She reports previous alcohol use. 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      Allergies  Allergen Reactions  . Penicillins Hives    Tolerated Zosyn and Cefazolin 04/2020    Medications Prior to Admission    Medication Sig Dispense Refill  . ibuprofen (ADVIL) 200 MG tablet Take 200-800 mg by mouth every 6 (six) hours as needed for moderate pain.    . phenazopyridine (PYRIDIUM) 95 MG tablet Take 95 mg by mouth 3 (three) times daily as needed for pain.    . potassium chloride SA (KLOR-CON) 20 MEQ tablet Take 1 tablet (20 mEq total) by mouth 2 (two) times daily. (Patient not taking: Reported on 04/23/2020) 10 tablet 0    Drug Regimen Review  Drug regimen was reviewed and remains appropriate with no significant issues identified  Home: Home Living Family/patient expects to be discharged to:: Private residence Living Arrangements: Spouse/significant other   Functional History:    Functional Status:  Mobility:          ADL:    Cognition: Cognition Orientation Level: Oriented X4     Blood pressure 136/80, pulse 96, temperature 98.3 F (36.8 C), resp. rate 18, height 5' 4"  (1.626 m), weight 54.5 kg, SpO2 100 %. Physical Exam Vitals and nursing note reviewed.  Constitutional:      Appearance: She is well-developed.     Comments: Slow movements due to pain/back spasms.   Pt is a frail appearing 59 yr old who is gaunt and sitting up in bedside chair pulling tangles from her hair, NAD, moving slowly- wearing lumbar corset  HENT:  Head: Normocephalic and atraumatic.     Comments: Smile equal- facial sensation intact Teeth discolored    Right Ear: External ear normal.     Left Ear: External ear normal.     Nose: Nose normal. No congestion or rhinorrhea.     Mouth/Throat:     Mouth: Mucous membranes are dry.     Pharynx: Oropharynx is clear. No oropharyngeal exudate.  Eyes:     General:        Right eye: No discharge.        Left eye: No discharge.     Extraocular Movements: Extraocular movements intact.  Neck:     Comments: Muscles in upper traps and rhomboids tight B/L Cardiovascular:     Comments: RRR_ no M/R/G Pulmonary:     Comments: CTA B/L- no W/R/R- good air  movement A little coarse, otherwise Abdominal:     Comments: Soft, NT, somewhat distended vs isolated protuberant, (+)BS  Hypoactive BS  Genitourinary:    Comments: Foley in place draining medium amber urine Musculoskeletal:     Cervical back: No rigidity.     Comments: UEs- Deltoids, biceps 4/5 B/L; Triceps, WE, grip and finger abd 4-/5 B/L LEs- HF 2+/5 on R; 2/5 on L; KE 2+/5, DF 2/5, and PF 3/5 B/L Wearing corset  Skin:    Comments: Thoracic incision looks good- a little puffy, but closed with glue- no erythema- no drainage R shoulder incision - a little open with some slough in it- sutures out No skin breakdown on buttocks/heels PICC line LUE  Neurological:     Mental Status: She is alert.     Comments: Ox2 No spasticity/spasms seen Notes that sensation intact to light touch in all 4 extremities- however pt not great historian and was focused on doing her hair.   Psychiatric:        Mood and Affect: Mood is anxious.     Comments: Perseverative on getting tangles out of her hair- mumbled a lot. Hard to understand sometimes     Results for orders placed or performed during the hospital encounter of 04/23/20 (from the past 48 hour(s))  Magnesium     Status: None   Collection Time: 05/08/20  5:20 AM  Result Value Ref Range   Magnesium 1.8 1.7 - 2.4 mg/dL    Comment: Performed at [facility], [place] [address] [place], [place] [address]  Potassium     Status: None   Collection Time: 05/08/20  5:20 AM  Result Value Ref Range   Potassium 3.5 3.5 - 5.1 mmol/L    Comment: Performed at [facility] [address] [place], [place] [address]  Magnesium     Status: None   Collection Time: 05/09/20  5:00 AM  Result Value Ref Range   Magnesium 2.0 1.7 - 2.4 mg/dL    Comment: Performed at [facility], [place] [address], [place], [place] [address]  Potassium     Status: Abnormal   Collection Time: 05/09/20  5:00 AM  Result Value Ref Range   Potassium 3.4 (L) 3.5 -  5.1 mmol/L    Comment: Performed at [facility] [address] [place], [place] [address]   No results found.     Medical Problem List and Plan: 1.  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  Antithrombotics: -DVT/anticoagulation:  Mechanical: Sequential compression devices, below knee Bilateral lower extremities   -will check with NSU when to allow Lovenox?  -antiplatelet therapy: N/A 3. Pain Management: OxyContin 20 mg bid with oxycodone prn. Continues to have a lot of pain due to back spasms--robaxin ineffective. Will change to flexeril 84m tid scheduled. Will decrease valium to 2 mg bid prn. - appeared that RN's not giving prns very often, so, will see what she needs 4. Mood: LCSW to follow for evaluation and support.   -antipsychotic agents:  N/A 5. Neuropsych: This patient is capable of making decisions on her own behalf. 6. Skin/Wound Care: Monitor incisions for healing. Continue Juven to promote wound healing.  7. Fluids/Electrolytes/Nutrition: Monitor I/O. Check lytes in am.  8. Disseminated MSSA infection with osteomyelitis, epidural abscess and R septic shoulder: Cefepime X 6 weeks with weekly CBC/BMP/ESR/CRP-end date 06/03/20.  9. HTN: Monitor BP tid--on Norvasc and Lisinopril daily.  10. Epidural abscess with diskitis: On IV antibiotics end date 06/03/20  11. Chronic pancreatitis: Abdominal symptoms have resolved. To follow with [facility] for Lap chole on outpatient basis.  12. Hypokalemia: Hypomagnesemia has resolved with supplement. Will continue to keep Kdur at 40 meq bid as tends to drop when decreased once a day. Will monitor lytes MWF for now.  13. Acute on chronic anemia: H/H stable. Recheck in am.  14. Neurogenic bladder/Urinary retention: Remove foley in am and start bladder training.  15.  Neurogenic bowel- will see if she needs bowel program- is likely since BM just "fell out" this AM when transferring- no control of it.    PIvan AnchorsLove, PA-C 05/09/2020   I have personally performed a face to face diagnostic evaluation of this patient and formulated the key components of the plan.  Additionally, I have personally reviewed laboratory data, imaging studies, as well as relevant notes and concur with the physician assistant's documentation above.   The patient's status has not changed from the original H&P.  Any changes in documentation from the acute care chart have been noted above.     MCourtney Heys MD 05/09/2020

## 2020-05-09 NOTE — Progress Notes (Signed)
Inpatient Rehabilitation Medication Review by a Pharmacist  A complete drug regimen review was completed for this patient to identify any potential clinically significant medication issues.  Clinically significant medication issues were identified:  no    Time spent performing this drug regimen review (minutes):  5   Debra Schmidt, [place] Clinical Pharmacist Please check AMION for all [facility] phone numbers After 10:00 PM, call Main Pharmacy [phone] 05/09/2020 9:43 PM

## 2020-05-10 ENCOUNTER — Inpatient Hospital Stay (HOSPITAL_COMMUNITY): Payer: Self-pay

## 2020-05-10 ENCOUNTER — Other Ambulatory Visit: Payer: Self-pay

## 2020-05-10 ENCOUNTER — Inpatient Hospital Stay (HOSPITAL_COMMUNITY): Payer: Self-pay | Admitting: Occupational Therapy

## 2020-05-10 ENCOUNTER — Encounter (HOSPITAL_COMMUNITY): Payer: Self-pay | Admitting: Physical Medicine and Rehabilitation

## 2020-05-10 ENCOUNTER — Inpatient Hospital Stay (HOSPITAL_COMMUNITY): Payer: Self-pay | Admitting: Physical Therapy

## 2020-05-10 LAB — CBC WITH DIFFERENTIAL/PLATELET
Abs Immature Granulocytes: 0.02 10*3/uL (ref 0.00–0.07)
Basophils Absolute: 0.1 10*3/uL (ref 0.0–0.1)
Basophils Relative: 1 %
Eosinophils Absolute: 0.4 10*3/uL (ref 0.0–0.5)
Eosinophils Relative: 7 %
HCT: 27.1 % — ABNORMAL LOW (ref 36.0–46.0)
Hemoglobin: 8.1 g/dL — ABNORMAL LOW (ref 12.0–15.0)
Immature Granulocytes: 0 %
Lymphocytes Relative: 19 %
Lymphs Abs: 1.1 10*3/uL (ref 0.7–4.0)
MCH: 26.3 pg (ref 26.0–34.0)
MCHC: 29.9 g/dL — ABNORMAL LOW (ref 30.0–36.0)
MCV: 88 fL (ref 80.0–100.0)
Monocytes Absolute: 0.4 10*3/uL (ref 0.1–1.0)
Monocytes Relative: 7 %
Neutro Abs: 3.7 10*3/uL (ref 1.7–7.7)
Neutrophils Relative %: 66 %
Platelets: 512 10*3/uL — ABNORMAL HIGH (ref 150–400)
RBC: 3.08 MIL/uL — ABNORMAL LOW (ref 3.87–5.11)
RDW: 14.6 % (ref 11.5–15.5)
WBC: 5.7 10*3/uL (ref 4.0–10.5)
nRBC: 0 % (ref 0.0–0.2)

## 2020-05-10 LAB — MAGNESIUM: Magnesium: 2 mg/dL (ref 1.7–2.4)

## 2020-05-10 LAB — COMPREHENSIVE METABOLIC PANEL
ALT: 6 U/L (ref 0–44)
AST: 20 U/L (ref 15–41)
Albumin: 2 g/dL — ABNORMAL LOW (ref 3.5–5.0)
Alkaline Phosphatase: 80 U/L (ref 38–126)
Anion gap: 8 (ref 5–15)
BUN: 9 mg/dL (ref 6–20)
CO2: 28 mmol/L (ref 22–32)
Calcium: 9.5 mg/dL (ref 8.9–10.3)
Chloride: 102 mmol/L (ref 98–111)
Creatinine, Ser: 0.45 mg/dL (ref 0.44–1.00)
GFR calc Af Amer: >60 mL/min (ref >60)
GFR calc non Af Amer: >60 mL/min (ref >60)
Glucose, Bld: 131 mg/dL — ABNORMAL HIGH (ref 70–99)
Potassium: 3.8 mmol/L (ref 3.5–5.1)
Sodium: 138 mmol/L (ref 135–145)
Total Bilirubin: 0.5 mg/dL (ref 0.3–1.2)
Total Protein: 7.5 g/dL (ref 6.5–8.1)

## 2020-05-10 MED ORDER — SODIUM CHLORIDE 0.9% FLUSH
10.0000 mL | INTRAVENOUS | Status: DC | PRN
  Administered 2020-05-19 – 2020-06-02 (×7): 10 mL

## 2020-05-10 MED ORDER — OXYCODONE HCL ER 15 MG PO T12A
15.0000 mg | EXTENDED_RELEASE_TABLET | Freq: Two times a day (BID) | ORAL | Status: DC
  Administered 2020-05-10 – 2020-05-16 (×12): 15 mg via ORAL
  Filled 2020-05-10 (×12): qty 1

## 2020-05-10 MED ORDER — CHLORHEXIDINE GLUCONATE CLOTH 2 % EX PADS
6.0000 | MEDICATED_PAD | Freq: Two times a day (BID) | CUTANEOUS | Status: DC
  Administered 2020-05-10 – 2020-06-04 (×46): 6 via TOPICAL

## 2020-05-10 MED ORDER — DIAZEPAM 5 MG PO TABS
5.0000 mg | ORAL_TABLET | Freq: Two times a day (BID) | ORAL | Status: DC
  Administered 2020-05-10 – 2020-05-13 (×7): 5 mg via ORAL
  Filled 2020-05-10 (×6): qty 1

## 2020-05-10 MED ORDER — SODIUM CHLORIDE 0.9% FLUSH
10.0000 mL | Freq: Two times a day (BID) | INTRAVENOUS | Status: DC
  Administered 2020-05-10 – 2020-06-02 (×31): 10 mL

## 2020-05-10 NOTE — Progress Notes (Signed)
Occupational Therapy Session Note  Patient Details  Name: Debra Schmidt MRN: [id number] Date of Birth: [birth year]  Today's Date: 05/10/2020 OT Individual Time: 1306-1330 OT Individual Time Calculation (min): 24 min  and Today's Date: 05/10/2020 OT Missed Time: 21 Minutes Missed Time Reason: Pain   Skilled Therapeutic Interventions/Progress Updates:    1:1. Pt received in bed reporting 10/10 pain in low back. RN alerted. Educated pt on pain management, different types of pain and pt agreeable to ice as pain intervention. Pt requesting to eat and OT educates pt needs to sit with HOB elevated to 30* for safe swallowing.  Pt scooted to [facility] total A and pt tolerates HOB elevated to 31*. Pt self feeds and OT exits with all needs met and call light in reach. Pt missed 21 min skilled OT d/t pain/eating  Therapy Documentation Precautions:  Precautions Precautions: Fall, Back Precaution Booklet Issued: No Precaution Comments:  (Reviewed back precautions) Required Braces or Orthoses: Spinal Brace Spinal Brace: Lumbar corset, Applied in sitting position Restrictions Weight Bearing Restrictions: Yes RUE Weight Bearing: Weight bearing as tolerated Other Position/Activity Restrictions: no overhead movement RUE General: General OT Amount of Missed Time: 21 Minutes Vital Signs:   Pain: Pain Assessment Pain Scale: 0-10 Pain Score: 9  Pain Type: Surgical pain Pain Location: Back Pain Orientation: Lower Pain Descriptors / Indicators: Grimacing;Guarding;Moaning;Stabbing;Sharp Pain Onset: On-going Patients Stated Pain Goal: 5 Pain Intervention(s): RN made aware;Repositioned Multiple Pain Sites: No ADL:   Vision Baseline Vision/History: Wears glasses Wears Glasses: Reading only Patient Visual Report: No change from baseline Vision Assessment?: No apparent visual deficits Perception  Perception: Within Functional Limits Praxis Praxis: Intact Exercises:   Other Treatments:      Therapy/Group: Individual Therapy  Tonny Branch 05/10/2020, 1:35 PM

## 2020-05-10 NOTE — Evaluation (Signed)
Occupational Therapy Assessment and Plan  Patient Details  Name: Debra Schmidt MRN: [id number] Date of Birth: [birth year]  OT Diagnosis: abnormal posture, acute pain, muscle weakness (generalized) and paraplegia, coordination disorder Rehab Potential: Rehab Potential (ACUTE ONLY): Good ELOS: 14-17 days   Today's Date: 05/10/2020 OT Individual Time: 1032-1130 and 1530-1600 OT Individual Time Calculation (min): 58 min   And 30 mins  Hospital Problem: Principal Problem:   Incomplete paraplegia ([place]) Active Problems:   Abscess in epidural space of lumbar spine   Neurogenic bladder   Neurogenic bowel   Past Medical History: History reviewed. No pertinent past medical history. Past Surgical History:  Past Surgical History:  Procedure Laterality Date  . ACROMIO-CLAVICULAR JOINT REPAIR Right 04/25/2020   Procedure: ACROMIO-CLAVICULAR JOINT REPAIR;  Surgeon: Renette Butters, MD;  Location: [place];  Service: Orthopedics;  Laterality: Right;  . I & D EXTREMITY Right 04/25/2020   Procedure: Open debridement R AC joint; Distal clavicle repair;  Surgeon: Renette Butters, MD;  Location: [place];  Service: Orthopedics;  Laterality: Right;  . LUMBAR LAMINECTOMY/DECOMPRESSION MICRODISCECTOMY N/A 04/23/2020   Procedure: Lumbar One-Two LAMINECTOMY for Epidural Abscess Evacuation;  Surgeon: Ashok Pall, MD;  Location: [place];  Service: Neurosurgery;  Laterality: N/A;  . RESECTION DISTAL CLAVICAL Right 04/25/2020   Procedure: RESECTION DISTAL CLAVICAL;  Surgeon: Renette Butters, MD;  Location: [place];  Service: Orthopedics;  Laterality: Right;    Assessment & Plan Clinical Impression: Patient is a 59 y.o. year old female with history of back pain otherwise in good health who was admitted on 04/23/20 with 72 hours history of worsening of pain with back spasms, urinary incontinence and weakness with difficulty walking. 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 UDS negative. She was taken to OR emergently for L1-L2 laminectomy with evacuation of epidural abscess and note made of L1/L3 diskitis by Dr. Christella Noa. She was started on broad spectrum antibiotics and wound culture positive for staph aureus. Patient noted to have right shoulder drainage and Dr. Bobbye Morton recommended local care and shoulder CT to rule out deeper abscess.  Patient reported poorly healing right shoulder wound after a fall 2 weeks PTA. CT shoulder done revealing soft tissue ulceration with underlying AC septic arthritis and osteomyelitis of acromion and distal clavicle. She underwent I & D of AC joint by Dr. Percell Miller on 06/15 with recommendations to avoid strenuous or overhead activity.    2D echo showed EF 65-70% with no wall or valvular  Abnormality.  Dr. Megan Salon recommended at least 6 weeks of IV cefazolin for disseminated MSSA infection--end date 06/03/20. Hospital course significant for intermittent episodes of significant hypokalemia amd hypomagnesemia. . She developed tachycardia with tachypnea on 06/19 and CTA chest was negative for PE but showed acute on chronic  Pancreatitis with fatty liver and GB sludge. She received fluid bolus and made NPO. Patient also reported abdominal pain and found to have elevated lipase level. Dr. Therisa Doyne recommended supportive care and MRCP done revealing acute on chronic pancreatitis, pancreas divisum, mild biliary dilatation of 1 cm and no definite choledocholithiasis and L1-L2 discitis with osteomyelitis and epidural abscess extending cephalad in the spinal canal and abnormal signal in thoracic spinal cord felt to  be similar to those shown in MRI a week prior.    General surgery consulted for input and recommended outpatient follow up for Lap chole in the future. She was kept NPO till symptoms improved and was started on low fat diet on 06/23. Symptoms of pancreatitis have resolved--Lipase down from 140-->91 and she is tolerating diet. Tachycardia has resolved and Norvasc and Lisinopril added due to upward trend in BP. Right shoulder sutures d/c 06/28 with recommendations to follow up with ortho in 2-3 weeks. She has recurrent hypokalemia today requiring increase in supplement.  Pain control improving with addition of Oxycontin. Therapy ongoing but she continues to have issues with processing, STM deficits with poor awareness of deficits affecting mobility and ADLs. .  Patient transferred to CIR on 05/09/2020 .    Patient currently requires mod - total A with basic self-care skills and IADL secondary to muscle weakness, decreased cardiorespiratoy endurance and decreased sitting balance, decreased standing balance and decreased postural control.  Prior to hospitalization, patient could complete ADLs and IADLs with modified independent .  Patient will benefit from skilled intervention to decrease level of assist with basic self-care skills prior to discharge home with care partner.  Anticipate patient will require 24 hour supervision and follow up home health.  OT - End of Session Activity Tolerance: Decreased this session Endurance Deficit: Yes Endurance Deficit Description: Frequent rest breaks during functional activity OT Assessment Rehab Potential (ACUTE ONLY): Good OT Barriers to Discharge: Other (comments) OT Barriers to Discharge Comments: none known at this time OT Patient demonstrates impairments in the following area(s): Balance;Endurance;Motor;Pain;Safety;Sensory OT Basic ADL's Functional Problem(s): Bathing;Dressing;Toileting OT Transfers Functional Problem(s): Toilet;Tub/Shower OT Additional Impairment(s): None OT Plan OT Intensity: Minimum  of 1-2 x/day, 45 to 90 minutes OT Frequency: 5 out of 7 days OT Duration/Estimated Length of Stay: 14-17 days OT Treatment/Interventions: Balance/vestibular training;Self Care/advanced ADL retraining;UE/LE Coordination activities;Functional mobility training;Community reintegration;Wheelchair propulsion/positioning;Discharge planning;Therapeutic Activities;Pain management;Disease mangement/prevention;Patient/family education;Therapeutic Exercise;DME/adaptive equipment instruction;Psychosocial support;UE/LE Strength taining/ROM OT Self Feeding Anticipated Outcome(s): n/a OT Basic Self-Care Anticipated Outcome(s): supervision OT Toileting Anticipated Outcome(s): supervision OT Bathroom Transfers Anticipated Outcome(s): supervision OT Recommendation Recommendations for Other Services: Neuropsych consult Patient destination: Home Follow Up Recommendations: Home health OT;24 hour supervision/assistance Equipment Recommended: To be determined   Skilled Therapeutic Intervention Session 1:  Upon entering the room, pt supine in bed and OT having to wake pt for evaluation. Pt reporting 9/10 pain in back while supine. Pt requesting to shower but also refusing to transfer from bed. Pt verbalized needing to use bathroom. Supine >sit with log roll and mod A. Pt performed stand pivot transfer with mod A to bedside commode chair. Pt bathing while seated on commode chair for time management. Although, pt reports increase in pain once seated on commode and unwilling to engage in some therapeutic tasks because " Just do it so I can get back into bed". Education provided on OT purpose, POC, and goals with pt verbalizing understanding and agreement. Pt not standing long enough for therapist to assist with pulling up pants and instead rolls L <> R on bed for clothing management. Pt calling for pain medication and is education that she has to sit EOB or allow staff to sit head up in order to take medication and eat meals  for safety with swallowing. Pt remained in bed at end of session with call bell and all needed items within reach.   Session 2: Upon entering the room, pt supine in bed and sleeping soundly. OT  making multiple attempts to awaken pt for OT participation. Pt does not remember what she did with this therapist and still believes it to be morning. OT orienting pt to time, location, and situation. OT attempting to review back precautions with pt who was able to verbalize "no bending" after several minutes. Education and functional examples given of maintaining back precautions.  Pt unable to remain alert during this session. Bed alarm activated and call bell within reach.    OT Evaluation Precautions/Restrictions  Precautions Precautions: Fall;Back Precaution Booklet Issued: No Precaution Comments:  (Reviewed back precautions) Required Braces or Orthoses: Spinal Brace Spinal Brace: Lumbar corset;Applied in sitting position Restrictions Weight Bearing Restrictions: Yes RUE Weight Bearing: Weight bearing as tolerated Other Position/Activity Restrictions: no overhead movement RUE Pain Pain Assessment Pain Scale: 0-10 Pain Score: 9  Pain Type: Surgical pain Pain Location: Back Pain Orientation: Lower Pain Descriptors / Indicators: Grimacing;Guarding;Moaning;Stabbing;Sharp Pain Onset: On-going Patients Stated Pain Goal: 5 Pain Intervention(s): RN made aware;Repositioned Multiple Pain Sites: No Home Living/Prior [place] expects to be discharged to:: Private residence Living Arrangements: Spouse/significant other Available Help at Discharge: Family, Available 24 hours/day Type of Home: House Home Access: Stairs to enter [employer] of Steps: 2 Entrance Stairs-Rails: Right, Left, Can reach both Home Layout: One level Bathroom Shower/Tub: Tub/shower unit, Multimedia programmer: Programmer, systems: Yes  Lives With: Spouse IADL  History Current License: Yes Mode of Transportation: Car Prior Function Level of Independence: Requires assistive device for independence  Able to Take Stairs?: Yes Driving: Yes Vision Baseline Vision/History: Wears glasses Wears Glasses: Reading only Patient Visual Report: No change from baseline Vision Assessment?: No apparent visual deficits Perception  Perception: Within Functional Limits Praxis Praxis: Intact Cognition Overall Cognitive Status: Within Functional Limits for tasks assessed Arousal/Alertness: Awake/alert Orientation Level: Person;Place;Situation Person: Oriented Place: Oriented Situation: Oriented Year: 2021 Month: June Day of Week: Incorrect (monday) Memory: Impaired Memory Impairment: Decreased short term memory;Decreased recall of new information Immediate Memory Recall: Sock;Blue;Bed Memory Recall Sock: Not able to recall Memory Recall Blue: Without Cue Memory Recall Bed: Not able to recall Attention: Focused;Sustained Focused Attention: Appears intact Sustained Attention: Appears intact Awareness: Appears intact Problem Solving: Impaired Safety/Judgment: Impaired Sensation Sensation Light Touch: Appears Intact Hot/Cold: Appears Intact Proprioception: Appears Intact Stereognosis: Not tested Coordination Gross Motor Movements are Fluid and Coordinated: No Fine Motor Movements are Fluid and Coordinated: No Coordination and Movement Description: Impaired d/t pain Motor  Motor Motor: Paraplegia;Abnormal postural alignment and control Motor - Skilled Clinical Observations: Incomplete paraplegia Mobility  Bed Mobility Bed Mobility: Rolling Right;Rolling Left;Supine to Sit;Sit to Supine Rolling Right: Minimal Assistance - Patient > 75% Rolling Left: Minimal Assistance - Patient > 75% Supine to Sit: Moderate Assistance - Patient 50-74% Sit to Supine: Moderate Assistance - Patient 50-74% Transfers Sit to Stand: Moderate Assistance - Patient  50-74% Stand to Sit: Moderate Assistance - Patient 50-74%  Trunk/Postural Assessment  Cervical Assessment Cervical Assessment: Exceptions to [facility] (Forward head, rounded shoulders) Thoracic Assessment Thoracic Assessment: Exceptions to [facility] (Kyphotic) Lumbar Assessment Lumbar Assessment: Exceptions to [facility] (Spinal precautions, lumbar corset) Postural Control Postural Control: Deficits on evaluation  Balance Balance Balance Assessed: Yes Extremity/Trunk Assessment RUE Assessment RUE Assessment: Not tested Passive Range of Motion (PROM) Comments: no overhead movement per orders LUE Assessment LUE Assessment: Within Functional Limits     Refer to Care Plan for Long Term Goals  Recommendations for other services: Neuropsych   Discharge Criteria: Patient will be discharged from OT if  patient refuses treatment 3 consecutive times without medical reason, if treatment goals not met, if there is a change in medical status, if patient makes no progress towards goals or if patient is discharged from hospital.  The above assessment, treatment plan, treatment alternatives and goals were discussed and mutually agreed upon: by patient  Gypsy Decant 05/10/2020, 11:24 AM

## 2020-05-10 NOTE — Progress Notes (Signed)
Inpatient Rehabilitation  Patient information reviewed and entered into eRehab system by Jerrik Housholder M. Keionte Swicegood, M.A., CCC/SLP, PPS Coordinator.  Information including medical coding, functional ability and quality indicators will be reviewed and updated through discharge.    

## 2020-05-10 NOTE — Plan of Care (Signed)
  Problem: RH SAFETY Goal: RH STG ADHERE TO SAFETY PRECAUTIONS W/ASSISTANCE/DEVICE Description: STG Adhere to Safety Precautions With min Assistance and appropriate assistive Device. Outcome: Progressing   Problem: RH PAIN MANAGEMENT Goal: RH STG PAIN MANAGED AT OR BELOW PT'S PAIN GOAL Description: <4 on a 0-10 pain scale. Outcome: Progressing   

## 2020-05-10 NOTE — Evaluation (Signed)
Physical Therapy Assessment and Plan  Patient Details  Name: Debra Schmidt MRN: [id number] Date of Birth: [birth year]  PT Diagnosis: Abnormal posture, Abnormality of gait, Difficulty walking, Low back pain, Muscle spasms, Muscle weakness, Paraplegia and Pain in R shoulder Rehab Potential: Good ELOS: 14-17 days   Today's Date: 05/10/2020 PT Individual Time: 0800-0900 PT Individual Time Calculation (min): 60 min    Hospital Problem: Principal Problem:   Incomplete paraplegia ([place]) Active Problems:   Abscess in epidural space of lumbar spine   Neurogenic bladder   Neurogenic bowel   Past Medical History: History reviewed. No pertinent past medical history. Past Surgical History:  Past Surgical History:  Procedure Laterality Date  . ACROMIO-CLAVICULAR JOINT REPAIR Right 04/25/2020   Procedure: ACROMIO-CLAVICULAR JOINT REPAIR;  Surgeon: Renette Butters, MD;  Location: [place];  Service: Orthopedics;  Laterality: Right;  . I & D EXTREMITY Right 04/25/2020   Procedure: Open debridement R AC joint; Distal clavicle repair;  Surgeon: Renette Butters, MD;  Location: [place];  Service: Orthopedics;  Laterality: Right;  . LUMBAR LAMINECTOMY/DECOMPRESSION MICRODISCECTOMY N/A 04/23/2020   Procedure: Lumbar One-Two LAMINECTOMY for Epidural Abscess Evacuation;  Surgeon: Ashok Pall, MD;  Location: [place];  Service: Neurosurgery;  Laterality: N/A;  . RESECTION DISTAL CLAVICAL Right 04/25/2020   Procedure: RESECTION DISTAL CLAVICAL;  Surgeon: Renette Butters, MD;  Location: [place];  Service: Orthopedics;  Laterality: Right;    Assessment & Plan Clinical Impression: Debra Schmidt is a 59 year old female with history of back pain otherwise in good health who was admitted on 04/23/20 with 72 hours history of worsening of pain with back spasms, urinary incontinence and weakness with difficulty walking. 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 UDS negative. She was taken to OR emergently for L1-L2 laminectomy with evacuation of epidural abscess and note made of L1/L3 diskitis by Dr. Christella Noa. She was started on broad spectrum antibiotics and wound culture positive for staph aureus. Patient noted to have right shoulder drainage and Dr. Bobbye Morton recommended local care and shoulder CT to rule out deeper abscess.  Patient reported poorly healing right shoulder wound after a fall 2 weeks PTA. CT shoulder done revealing soft tissue ulceration with underlying AC septic arthritis and osteomyelitis of acromion and distal clavicle. She underwent I & D of AC joint by Dr. Percell Miller on 06/15 with recommendations to avoid strenuous or overhead activity.    2D echo showed EF 65-70% with no wall or valvular  Abnormality.  Dr. Megan Salon recommended at least 6 weeks of IV cefazolin for disseminated MSSA infection--end date 06/03/20. Hospital course significant for intermittent episodes of significant hypokalemia amd hypomagnesemia. . She developed tachycardia with tachypnea on 06/19 and CTA chest was negative for PE but showed acute on chronic  Pancreatitis with fatty liver and GB sludge. She received fluid bolus and made NPO. Patient also reported abdominal pain and found to have elevated lipase level. Dr. Therisa Doyne recommended supportive care and MRCP done revealing acute on chronic pancreatitis, pancreas divisum, mild biliary dilatation of 1 cm and no definite choledocholithiasis and L1-L2 discitis with osteomyelitis and epidural abscess extending cephalad in the spinal canal and abnormal signal in thoracic spinal cord felt to  be similar to those shown in MRI a week prior.   General  surgery consulted for input and recommended outpatient follow up for Lap chole in the future. She was kept NPO till symptoms improved and was started on low fat diet on 06/23. Symptoms of pancreatitis have resolved--Lipase down from 140-->91 and she is tolerating diet. Tachycardia has resolved and Norvasc and Lisinopril added due to upward trend in BP. Right shoulder sutures d/c 06/28 with recommendations to follow up with ortho in 2-3 weeks. She has recurrent hypokalemia today requiring increase in supplement.  Pain control improving with addition of Oxycontin. Therapy ongoing but she continues to have issues with processing, STM deficits with poor awareness of deficits affecting mobility and ADLs. CIR recommended due to functional deficits. Patient transferred to CIR on 05/09/2020 .   Patient currently requires mod with mobility secondary to muscle weakness, decreased cardiorespiratoy endurance, impaired timing and sequencing, ataxia, decreased coordination and decreased motor planning, decreased safety awareness and decreased memory and decreased sitting balance, decreased standing balance, decreased postural control and decreased balance strategies.  Prior to hospitalization, patient was modified independent  with mobility and lived with Spouse in a House home.  Home access is 2Stairs to enter.  Patient will benefit from skilled PT intervention to maximize safe functional mobility, minimize fall risk and decrease caregiver burden for planned discharge home with 24 hour supervision.  Anticipate patient will benefit from follow up [place] at discharge.  PT - End of Session Activity Tolerance: Tolerates 30+ min activity with multiple rests Endurance Deficit: Yes Endurance Deficit Description: Frequent rest breaks during functional activity PT Assessment Rehab Potential (ACUTE/IP ONLY): Good PT Barriers to Discharge: Medical stability;Home environment access/layout;Weight bearing restrictions PT Patient  demonstrates impairments in the following area(s): Balance;Endurance;Motor;Pain;Skin Integrity;Safety PT Transfers Functional Problem(s): Bed Mobility;Bed to Chair;Car;Furniture;Floor PT Locomotion Functional Problem(s): Ambulation;Wheelchair Mobility;Stairs PT Plan PT Intensity: Minimum of 1-2 x/day ,45 to 90 minutes PT Frequency: 5 out of 7 days PT Duration Estimated Length of Stay: 14-17 days PT Treatment/Interventions: Ambulation/gait training;Balance/vestibular training;Community reintegration;DME/adaptive equipment instruction;Functional mobility training;Neuromuscular re-education;Pain management;Patient/family education;Stair training;Therapeutic Activities;Therapeutic Exercise;UE/LE Coordination activities;UE/LE Strength taining/ROM PT Transfers Anticipated Outcome(s): Supervision PT Locomotion Anticipated Outcome(s): Supervision with LRAD PT Recommendation Recommendations for Other Services: Neuropsych consult Follow Up Recommendations: Home health PT Patient destination: Home Equipment Recommended: Other (comment) (Pt already obtained RW)  Skilled Therapeutic Intervention Evaluation completed (see details above and below) with education on PT POC and goals and individual treatment initiated with focus on functional transfer assessment, orientation to rehab unit, and setting pt up with appropriate equipment.   Pt received supine in bed. Agreeable to therapy. Pt states she is having "a lot" of low back pain which she describes as spasms. Addressed patient's pain with positioning and frequent rest breaks throughout session. Pt performs rolling L/R with min A and verbal cues required for hand placement on bedrail. Supine <> sit with mod A d/t LE managment. Pt requests to use the restroom. Stand pivot transfer EOB <> bedside commode with RW and mod A. Pt unable to void at this time d/t significant low back pain. Squat pivot transfer EOB to W/C with RW and mod A. Performed sit <> stand with  mod A throughout session. Ambulated 10 feet with RW and min-mod A x2. Pt demonstrates a narrow BOS and decreased step length when using RW with frequent verbal cues required for correct foot placement. Pt left supine in bed with all needs in reach.  PT Evaluation Precautions/Restrictions Precautions Precautions:  Fall;Back Precaution Booklet Issued: No Precaution Comments:  (Reviewed back precautions) Required Braces or Orthoses: Spinal Brace Spinal Brace: Lumbar corset;Applied in sitting position Restrictions Other Position/Activity Restrictions: no overhead movement RUE General   Vital SignsTherapy Vitals Temp: 98 F (36.7 C) Temp Source: Oral Pulse Rate: 98 Resp: 18 BP: 115/77 Patient Position (if appropriate): Lying Oxygen Therapy SpO2: 100 % O2 Device: Room Air Pain Pain Assessment Pain Scale: 0-10 Pain Score: 8  Pain Type: Surgical pain Pain Location: Back Pain Orientation: Lower Pain Descriptors / Indicators: Discomfort;Spasm;Aching;Sore Pain Frequency: Constant Pain Onset: On-going Pain Intervention(s): Medication (See eMAR) Home Living/Prior Functioning Home Living Available Help at Discharge: Family Type of Home: House Home Access: Stairs to enter Technical brewer of Steps: 2 Entrance Stairs-Rails: Right;Left;Can reach both Home Layout: One level  Lives With: Spouse Prior Function Level of Independence: Independent with gait;Independent with transfers;Requires assistive device for independence  Able to Take Stairs?: Yes Vision/Perception  Perception Perception: Within Functional Limits Praxis Praxis: Intact  Cognition Overall Cognitive Status: Within Functional Limits for tasks assessed Arousal/Alertness: Awake/alert Attention: Focused;Sustained Focused Attention: Appears intact Sustained Attention: Appears intact Memory: Impaired Memory Impairment: Decreased short term memory Awareness: Appears intact Problem Solving: Appears  intact Safety/Judgment: Appears intact Sensation Sensation Light Touch: Appears Intact Proprioception: Appears Intact Coordination Gross Motor Movements are Fluid and Coordinated: No (secondary to pain) Fine Motor Movements are Fluid and Coordinated: No (secondary to pain) Coordination and Movement Description: Impaired d/t pain Motor  Motor Motor: Paraplegia;Abnormal postural alignment and control Motor - Skilled Clinical Observations: Incomplete paraplegia  Mobility Bed Mobility Bed Mobility: Rolling Right;Rolling Left;Supine to Sit;Sit to Supine Rolling Right: Minimal Assistance - Patient > 75% Rolling Left: Minimal Assistance - Patient > 75% Supine to Sit: Moderate Assistance - Patient 50-74% Sit to Supine: Moderate Assistance - Patient 50-74% Transfers Transfers: Sit to Stand;Stand to Sit;Stand Pivot Transfers Sit to Stand: Moderate Assistance - Patient 50-74% Stand to Sit: Moderate Assistance - Patient 50-74% Stand Pivot Transfers: Moderate Assistance - Patient 50 - 74% Stand Pivot Transfer Details: Tactile cues for posture;Visual cues/gestures for sequencing;Verbal cues for technique;Verbal cues for gait pattern Transfer (Assistive device): Rolling walker Locomotion  Gait Assistive device: Rolling walker Gait Gait Pattern: Impaired (Narrow BOS, decreased step length) Gait velocity: dec Stairs / Additional Locomotion Stairs: No Wheelchair Mobility Wheelchair Mobility: No  Trunk/Postural Assessment  Cervical Assessment Cervical Assessment: Exceptions to [facility] (Forward head, rounded shoulders) Thoracic Assessment Thoracic Assessment: Exceptions to [facility] (Kyphotic) Lumbar Assessment Lumbar Assessment: Exceptions to [facility] (Spinal precautions, lumbar corset) Postural Control Postural Control: Deficits on evaluation  Balance Balance Balance Assessed: Yes Static Sitting Balance Static Sitting - Balance Support: Feet supported;Bilateral upper extremity supported Static  Sitting - Level of Assistance: 4: Min Insurance risk surveyor Standing - Balance Support: Bilateral upper extremity supported Static Standing - Level of Assistance: 4: Min assist Dynamic Standing Balance Dynamic Standing - Balance Support: Bilateral upper extremity supported Dynamic Standing - Level of Assistance: 3: Mod assist Extremity Assessment      RLE Assessment RLE Assessment: Exceptions to [facility] General Strength Comments: impaired; see below RLE Strength Right Hip Flexion: 2+/5 Right Knee Flexion: 2-/5 Right Knee Extension: 2+/5 Right Ankle Dorsiflexion: 3/5 LLE Assessment LLE Assessment: Exceptions to [facility] General Strength Comments: impaired; see below LLE Strength Left Hip Flexion: 2+/5 Left Knee Flexion: 2-/5 Left Knee Extension: 2+/5 Left Ankle Dorsiflexion: 3/5  Refer to Care Plan for Long Term Goals  Recommendations for other services: Neuropsych  Discharge Criteria: Patient  will be discharged from PT if patient refuses treatment 3 consecutive times without medical reason, if treatment goals not met, if there is a change in medical status, if patient makes no progress towards goals or if patient is discharged from hospital.  The above assessment, treatment plan, treatment alternatives and goals were discussed and mutually agreed upon: by patient  Nadeen Landau, SPT 05/10/2020, 3:53 PM

## 2020-05-10 NOTE — Progress Notes (Signed)
[place] PHYSICAL MEDICINE & REHABILITATION PROGRESS NOTE   Subjective/Complaints:   Pt reports pain/esp muscle spasms are very limiting- per PT as well, pain is her biggest limiting factor, however very motivated- Mainly having muscle spasms on low back  Pain is 8-10/10- notes that Valium had been decreased as well to 2 mg BID prn-  ROS:  Pt denies SOB, abd pain, CP, N/V/C/D, and vision changes   Objective:   No results found. Recent Labs    05/10/20 0500  WBC 5.7  HGB 8.1*  HCT 27.1*  PLT 512*   Recent Labs    05/09/20 0500 05/10/20 0500  NA  --  138  K 3.4* 3.8  CL  --  102  CO2  --  28  GLUCOSE  --  131*  BUN  --  9  CREATININE  --  0.45  CALCIUM  --  9.5    Intake/Output Summary (Last 24 hours) at 05/10/2020 1354 Last data filed at 05/10/2020 0900 Gross per 24 hour  Intake 220 ml  Output 3000 ml  Net -2780 ml     Physical Exam: Vital Signs Blood pressure 119/86, pulse 88, temperature 98.8 F (37.1 C), temperature source Oral, resp. rate 18, height 5' 4"  (1.626 m), weight 54.5 kg, SpO2 100 %.  Constitutional:  pt awake, alert, laying supine in bed- with PT/student at bedside, no acute distress HENT: conjugate gaze    Neck:     Comments: Muscles in upper traps and rhomboids tight still B/L Cardiovascular: RRR Pulmonary:  CTA B/L Abdominal: Soft, NT, ND, (+)BS  Genitourinary:    Comments: Foley in place draining medium amber urine- still in place Musculoskeletal:  UEs- Deltoids, biceps 4/5 B/L; Triceps, WE, grip and finger abd 4-/5 B/L LEs- HF 2+/5 on R; 2/5 on L; KE 2+/5, DF 2/5, and PF 3/5 B/L Skin:incision looks great- no drainage, no erythema- a little puffy, but good PICC line LUE  Neurological: Ox3 No spasticity seen in LEs Notes that sensation intact to light touch in all 4 extremities-? Psychiatric:        Mood and Affect: still anxious due to pain   Assessment/Plan: 1. Functional deficits secondary to Incomplete paraplegia due  to epidural abscess with neurogenic bowel and bladder which require 3+ hours per day of interdisciplinary therapy in a comprehensive inpatient rehab setting.  Physiatrist is providing close team supervision and 24 hour management of active medical problems listed below.  Physiatrist and rehab team continue to assess barriers to discharge/monitor patient progress toward functional and medical goals  Care Tool:  Bathing              Bathing assist       Upper Body Dressing/Undressing Upper body dressing        Upper body assist      Lower Body Dressing/Undressing Lower body dressing            Lower body assist       Toileting Toileting    Toileting assist Assist for toileting: Moderate Assistance - Patient 50 - 74%     Transfers Chair/bed transfer  Transfers assist           Locomotion Ambulation   Ambulation assist              Walk 10 feet activity   Assist           Walk 50 feet activity   Assist  Walk 150 feet activity   Assist           Walk 10 feet on uneven surface  activity   Assist           Wheelchair     Assist               Wheelchair 50 feet with 2 turns activity    Assist            Wheelchair 150 feet activity     Assist          Blood pressure 119/86, pulse 88, temperature 98.8 F (37.1 C), temperature source Oral, resp. rate 18, height 5' 4"  (1.626 m), weight 54.5 kg, SpO2 100 %.  Medical Problem List and Plan: 1.  Impaired functionsecondary to incomplete ASIA C paraplegia due to osteomyelitis and epidural abscess with neurogenic bowel and bladder             -patient may  Shower if covers incision     -ELOS/Goals: 3-4 weeks- CGA- Supervision 2.  Antithrombotics: -DVT/anticoagulation:  Mechanical: Sequential compression devices, below knee Bilateral lower extremities              -will check with NSU when to allow Lovenox?             -antiplatelet  therapy: N/A 3. Pain Management: OxyContin 20 mg bid with oxycodone prn. Continues to have a lot of pain due to back spasms--robaxin ineffective. Will change to flexeril 48m tid scheduled. Will decrease valium to 2 mg bid prn. - appeared that RN's not giving prns very often, so, will see what she needs  6/30- increase Valium to 5 mg BID and make scheduled; con't oxycontin but move to 6am and 6pm- and con't flexeril and oxy prn.q3 H prn 4. Mood: LCSW to follow for evaluation and support.              -antipsychotic agents:  N/A 5. Neuropsych: This patient is capable of making decisions on her own behalf. 6. Skin/Wound Care: Monitor incisions for healing. Continue Juven to promote wound healing.  7. Fluids/Electrolytes/Nutrition: Monitor I/O. Check lytes in am.  8. Disseminated MSSA infection with osteomyelitis, epidural abscess and R septic shoulder: Cefepime X 6 weeks with weekly CBC/BMP/ESR/CRP-end date 06/03/20.  9. HTN: Monitor BP tid--on Norvasc and Lisinopril daily.   6/30- BP well controlled;  will monitor for orthostatic hypotension 10. Epidural abscess with diskitis: On IV antibiotics end date 06/03/20  11. Chronic pancreatitis: Abdominal symptoms have resolved. To follow with [facility] for Lap chole on outpatient basis.  12. Hypokalemia: Hypomagnesemia has resolved with supplement. Will continue to keep Kdur at 40 meq bid as tends to drop when decreased once a day. Will monitor lytes MWF for now.   6/30- K+ much better 3.8- will check frequently 13. Acute on chronic anemia: H/H stable. Recheck in am.  14. Neurogenic bladder/Urinary retention: Remove foley in am and start bladder training.  6/30- remove foley today  15. Neurogenic bowel- will see if she needs bowel program- is likely since BM just "fell out" this AM when transferring- no control of it.   6/30- see if needs Bowel program     LOS: 1 days A FACE TO FACE EVALUATION WAS PERFORMED  Debra Schmidt 05/10/2020, 1:54 PM

## 2020-05-11 ENCOUNTER — Inpatient Hospital Stay (HOSPITAL_COMMUNITY): Payer: Medicaid Other

## 2020-05-11 ENCOUNTER — Inpatient Hospital Stay (HOSPITAL_COMMUNITY): Payer: Self-pay

## 2020-05-11 ENCOUNTER — Inpatient Hospital Stay (HOSPITAL_COMMUNITY): Payer: Self-pay | Admitting: Physical Therapy

## 2020-05-11 DIAGNOSIS — R531 Weakness: Secondary | ICD-10-CM

## 2020-05-11 MED ORDER — METHOCARBAMOL 500 MG PO TABS
500.0000 mg | ORAL_TABLET | Freq: Four times a day (QID) | ORAL | Status: DC | PRN
  Administered 2020-05-11 – 2020-05-17 (×5): 500 mg via ORAL
  Filled 2020-05-11 (×5): qty 1

## 2020-05-11 NOTE — Progress Notes (Signed)
Occupational Therapy Session Note  Patient Details  Name: Debra Schmidt MRN: [id number] Date of Birth: 11/23/1960  Today's Date: 05/11/2020 OT Individual Time: 1045-1100 OT Individual Time Calculation (min): 15 min  and Today's Date: 05/11/2020 OT Missed Time: 60 Minutes Missed Time Reason: Pain   Short Term Goals: Week 1:  OT Short Term Goal 1 (Week 1): Pt will perform toileting with min A overall. OT Short Term Goal 2 (Week 1): Pt will perform LB dressing with use of AE or modifcations with min A. OT Short Term Goal 3 (Week 1): Pt perform toilet transfer with min A. OT Short Term Goal 4 (Week 1): Pt will stand for 2 minutes with min A or less for balance during self care tasks.  Skilled Therapeutic Interventions/Progress Updates:    Pt resting in bed upon arrival with eyes closed.  Easily aroused but difficulty keeping eyes open during encounter. Reviewed POC and role of OT during stay in Rehab. Pt declined OOB or sitting EOB activity 2/2 increased pain with movement. Pt missed 60 mins skilled OT services.  Will check back later in day.   Therapy Documentation Precautions:  Precautions Precautions: Fall, Back Precaution Booklet Issued: No Precaution Comments:  (Reviewed back precautions) Required Braces or Orthoses: Spinal Brace Spinal Brace: Lumbar corset, Applied in sitting position Restrictions Weight Bearing Restrictions: Yes RUE Weight Bearing: Weight bearing as tolerated Other Position/Activity Restrictions: no overhead movement RUE General: General OT Amount of Missed Time: 60 Minutes  Pain:  Pt c/o 10/10 pain in middle back with movement; emotional support and repositioning, meds admin approx 30 mins prior to therapy   Therapy/Group: Individual Therapy  Rich Brave 05/11/2020, 11:13 AM

## 2020-05-11 NOTE — Progress Notes (Signed)
Bilateral lower extremity venous duplex has been completed. Preliminary results can be found in CV Proc through chart review.   05/11/20 12:24 PM Olen Cordial RVT

## 2020-05-11 NOTE — Progress Notes (Signed)
Physical Therapy Session Note                                                                                Pt missed 10 min of session due to pain Patient Details  Name: Debra Schmidt MRN: [id number] Date of Birth: Feb 29, 1961  Today's Date: 05/11/2020 PT Individual Time: 0900-0945 PT Individual Time Calculation (min): 45 min   Short Term Goals: Week 1:  PT Short Term Goal 1 (Week 1): Pt will perform bed mobility with min A PT Short Term Goal 2 (Week 1): Pt will perform transfers with min A PT Short Term Goal 3 (Week 1): Pt will ambulate 50 feet with RW and min A PT Short Term Goal 4 (Week 1): Pt will initiate stair training  Skilled Therapeutic Interventions/Progress Updates:    PAIN  8/10 back pain w/mobility.  Requested pain meds from nursing.  Treatment to tolerance.  Pt missed 10 min of session due to pain  Pt initially sleeping, slow to arouse, groggy for first 5-7 min of session. Reviewed spinal precautions w/patient (pt recalls  2 of 3) and proper technique for supine to side to sit to adhere to spinal precautions.  Performed supine to side to sit w/verbal cues and mod assist, painful transition for pt. Pt sat x 10 min on edge of bed w/cga, scoots and repositions w/cga.  Therapist applied corset w/pt in sitting.   STS to RW w/min assist, stood x 45 sec w/encouragement, cga. Rested in sitting for 2 min then repeated STS w/min assist w/RW and stood 20sec w/cga.  Pt then scoots in sitting toward head of bed w/cga.  Pt declined further standing or gait due to pain.   Corset removed by therapist. Reviewed spinal precautions and sit to side to supine/logrolling and pt performs w/mod assist and cues.  Pt declined further PT rx including supine therex citing pain. Pt left supine w/rails up x 3, alarm set, bed in lowest position, and needs in reach.  Therapy Documentation Precautions:  Precautions Precautions: Fall, Back Precaution Booklet Issued: No Precaution Comments:  (Reviewed  back precautions) Required Braces or Orthoses: Spinal Brace Spinal Brace: Lumbar corset, Applied in sitting position Restrictions Weight Bearing Restrictions: Yes RUE Weight Bearing: Weight bearing as tolerated Other Position/Activity Restrictions: no overhead movement RUE    Therapy/Group: Individual Therapy  Rada Hay, PT   Shearon Balo 05/11/2020, 12:57 PM

## 2020-05-11 NOTE — Progress Notes (Signed)
Physical Therapy Session Note  Patient Details  Name: Debra Schmidt MRN: [id number] Date of Birth: 1961-07-22  Today's Date: 05/11/2020 PT Individual Time: 0900-0945 PT Individual Time Calculation (min): 45 min  PT Missed Time: 15 Missed Time Reason: Significant low back pain  Short Term Goals: Week 1:  PT Short Term Goal 1 (Week 1): Pt will perform bed mobility with min A PT Short Term Goal 2 (Week 1): Pt will perform transfers with min A PT Short Term Goal 3 (Week 1): Pt will ambulate 50 feet with RW and min A PT Short Term Goal 4 (Week 1): Pt will initiate stair training  Skilled Therapeutic Interventions/Progress Updates:  Pt received semi-reclined in bed finishing breakfast. Agreeable to therapy. States low back pain is a 7.5/10 at rest in bed prior to starting therapy session, and that no positions improve back pain. Addressed patient's pain with positioning, frequent rest breaks, and adjusting lumbar corset throughout session. In addition, patient's MD and RN were made aware of patient's pain, and RN brought pain medication during therapy session. Reviewed spinal precautions with pt. Pt performs rolling L/R with min A throughout session with verbal cues required for hand placement on bed rail. Supine to sit with mod A d/t LE management. Lumbar corset donned by therapist with pt seated at  EOB. Stand pivot transfer from EOB to W/C with RW and A x2 with mod A x1 and min A x1 d/t significant pain level. Dependent W/C transfer to/from hallway for time conservation. Sit <> stand performed with RW and mod A throughout session. Ambulated 8 feet with RW and mod A and close W/C follow, however pt is unable to ambulate further d/t pain and requests to return to her room. Pt demonstrates a narrow BOS and decreased step length when ambulating. Verbal cues required to correct anterior trunk lean. Stand pivot transfer from W/C to EOB with mod A. Lumbar corset doffed by therapist with pt seated at EOB. Sit  to supine with A x2 with mod A x1 for LE management and min A x1 d/t significant pain level. Short-acting hot pack placed to low back for pain relief. Pt left supine in bed with all needs in reach. All questions answered. Patient missed 15 minutes of scheduled therapy session d/t significant low back pain limiting participation.  Therapy Documentation Precautions:  Precautions Precautions: Fall, Back Precaution Booklet Issued: No Precaution Comments:  (Reviewed back precautions) Required Braces or Orthoses: Spinal Brace Spinal Brace: Lumbar corset, Applied in sitting position Restrictions Weight Bearing Restrictions: Yes RUE Weight Bearing: Weight bearing as tolerated Other Position/Activity Restrictions: no overhead movement RUE  Therapy/Group: Individual Therapy  Murrell Redden, SPT   05/11/2020, 12:40 PM

## 2020-05-11 NOTE — Plan of Care (Signed)
  Problem: Consults °Goal: RH SPINAL CORD INJURY PATIENT EDUCATION °Description:  See Patient Education module for education specifics.  °Outcome: Progressing °Goal: Skin Care Protocol Initiated - if Braden Score 18 or less °Description: If consults are not indicated, leave blank or document N/A °Outcome: Progressing °Goal: Nutrition Consult-if indicated °Outcome: Progressing °  °Problem: SCI BOWEL ELIMINATION °Goal: RH STG MANAGE BOWEL WITH ASSISTANCE °Description: STG Manage Bowel with max/total Assistance. °Outcome: Progressing °Goal: RH STG SCI MANAGE BOWEL WITH MEDICATION WITH ASSISTANCE °Description: STG SCI Manage bowel with medication with max/total assistance. °Outcome: Progressing °Goal: RH STG SCI MANAGE BOWEL PROGRAM W/ASSIST OR AS APPROPRIATE °Description: STG SCI Manage bowel program w/max/total assist or as appropriate. °Outcome: Progressing °  °Problem: SCI BLADDER ELIMINATION °Goal: RH STG MANAGE BLADDER WITH ASSISTANCE °Description: STG Manage Bladder With min Assistance °Outcome: Progressing °Goal: RH STG MANAGE BLADDER WITH MEDICATION WITH ASSISTANCE °Description: STG Manage Bladder With Medication With min Assistance. °Outcome: Progressing °  °Problem: RH SKIN INTEGRITY °Goal: RH STG SKIN FREE OF INFECTION/BREAKDOWN °Description: Skin to remain free from infection/breakdown while on rehab with min assist from staff. °Outcome: Progressing °Goal: RH STG MAINTAIN SKIN INTEGRITY WITH ASSISTANCE °Description: STG Maintain Skin Integrity With min Assistance. °Outcome: Progressing °Goal: RH STG ABLE TO PERFORM INCISION/WOUND CARE W/ASSISTANCE °Description: STG Able To Perform Incision/Wound Care With mod Assistance. °Outcome: Progressing °  °Problem: RH SAFETY °Goal: RH STG ADHERE TO SAFETY PRECAUTIONS W/ASSISTANCE/DEVICE °Description: STG Adhere to Safety Precautions With min Assistance and appropriate assistive Device. °Outcome: Progressing °  °Problem: RH PAIN MANAGEMENT °Goal: RH STG PAIN MANAGED  AT OR BELOW PT'S PAIN GOAL °Description: <4 on a 0-10 pain scale. °Outcome: Progressing °  °Problem: RH KNOWLEDGE DEFICIT SCI °Goal: RH STG INCREASE KNOWLEDGE OF SELF CARE AFTER SCI °Description: Patient will demonstrate knowledge of medication management, bladder program, bowel program, safety precautions, and follow up care with the MD post discharge with min assist from CIR staff. °Outcome: Progressing °  °

## 2020-05-11 NOTE — Progress Notes (Signed)
Patient ID: [name], female   DOB: 1961/04/07, 59 y.o.   MRN: [id number]  Met with patient during PT session, however pain was too intense and was requesting pain medication. This RN said she would speak with her nurse and see if she was due for any at this time.  Spoke with the patient's nurse and she was not due for pain medication at this time however, nurse went to speak with patient.  Dorthula Nettles, RN, BSN, [facility] [phone] Cell [phone]

## 2020-05-11 NOTE — Care Management (Signed)
[facility] Individual Statement of Services  Patient Name:  Debra Schmidt  Date:  05/11/2020  Welcome to the [facility].  Our goal is to provide you with an individualized program based on your diagnosis and situation, designed to meet your specific needs.  With this comprehensive rehabilitation program, you will be expected to participate in at least 3 hours of rehabilitation therapies Monday-Friday, with modified therapy programming on the weekends.  Your rehabilitation program will include the following services:  Physical Therapy (PT), Occupational Therapy (OT), 24 hour per day rehabilitation nursing, Therapeutic Recreaction (TR), Psychology, Neuropsychology, Care Coordinator, Rehabilitation Medicine, Nutrition Services, Pharmacy Services and Other  Weekly team conferences will be held on Tuesdays to discuss your progress.  Your Inpatient Rehabilitation Care Coordinator will talk with you frequently to get your input and to update you on team discussions.  Team conferences with you and your family in attendance may also be held.  Expected length of stay: 14-17 days    Overall anticipated outcome: Supervision  Depending on your progress and recovery, your program may change. Your Inpatient Rehabilitation Care Coordinator will coordinate services and will keep you informed of any changes. Your Inpatient Rehabilitation Care Coordinator's name and contact numbers are listed  below.  The following services may also be recommended but are not provided by the [facility]:   Driving Evaluations  Home Health Rehabiltiation Services  Outpatient Rehabilitation Services  Vocational Rehabilitation   Arrangements will be made to provide these services after discharge if needed.  Arrangements include referral to agencies that provide these services.  Your insurance has been verified to be:  Uninsured  Your primary doctor is:  No  PCP  Pertinent information will be shared with your doctor and your insurance company.  Inpatient Rehabilitation Care Coordinator:  Susie Cassette [phone] or [phone]  Information discussed with and copy given to patient by: Gretchen Short, 05/11/2020, 2:04 PM

## 2020-05-11 NOTE — Progress Notes (Signed)
[facility] PHYSICAL MEDICINE & REHABILITATION PROGRESS NOTE   Subjective/Complaints: Muscle spasms severe. Asks for her Robaxin- there is none ordered. Says this helps with her spasms and does not sedate her. I have ordered 576m q6H prn and asked nursing to bring this for her in the gym Having regular BM  ROS:  Pt denies SOB, abd pain, CP, N/V/C/D, and vision changes   Objective:   No results found. Recent Labs    05/10/20 0500  WBC 5.7  HGB 8.1*  HCT 27.1*  PLT 512*   Recent Labs    05/09/20 0500 05/10/20 0500  NA  --  138  K 3.4* 3.8  CL  --  102  CO2  --  28  GLUCOSE  --  131*  BUN  --  9  CREATININE  --  0.45  CALCIUM  --  9.5    Intake/Output Summary (Last 24 hours) at 05/11/2020 0924 Last data filed at 05/11/2020 0900 Gross per 24 hour  Intake 610 ml  Output 1058 ml  Net -448 ml     Physical Exam: Vital Signs Blood pressure 101/64, pulse (!) 104, temperature 98.4 F (36.9 C), temperature source Oral, resp. rate 18, height 5' 4"  (1.626 m), weight 54.5 kg, SpO2 98 %. General: Alert and oriented x 3, No apparent distress HEENT: Head is normocephalic, atraumatic, PERRLA, EOMI, sclera anicteric. Muscles in upper traps and rhomboids tight still B/L Cardiovascular: RRR Pulmonary:  CTA B/L Abdominal: Soft, NT, ND, (+)BS  Genitourinary:    Comments: Foley in place draining medium amber urine- still in place Musculoskeletal:  UEs- Deltoids, biceps 4/5 B/L; Triceps, WE, grip and finger abd 4-/5 B/L LEs- HF 2+/5 on R; 2/5 on L; KE 2+/5, DF 2/5, and PF 3/5 B/L Skin:incision looks great- no drainage, no erythema- a little puffy, but good PICC line LUE  Neurological: Ox3 No spasticity seen in LEs Notes that sensation intact to light touch in all 4 extremities-? Psychiatric:        Mood and Affect: still anxious due to pain   Assessment/Plan: 1. 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  Physiatrist is providing close team supervision and 24 hour management of active medical problems listed below.  Physiatrist and rehab team continue to assess barriers to discharge/monitor patient progress toward functional and medical goals  Care Tool:  Bathing    Body parts bathed by patient: Chest, Abdomen, Front perineal area, Right upper leg, Left upper leg, Face   Body parts bathed by helper: Buttocks, Right arm, Left arm, Right lower leg, Left lower leg     Bathing assist Assist Level: Moderate Assistance - Patient 50 - 74%     Upper Body Dressing/Undressing Upper body dressing   What is the patient wearing?: Hospital gown only    Upper body assist Assist Level: Maximal Assistance - Patient 25 - 49%    Lower Body Dressing/Undressing Lower body dressing      What is the patient wearing?: Incontinence brief     Lower body assist Assist for lower body dressing: Total Assistance - Patient < 25%     Toileting Toileting    Toileting assist Assist for toileting: Maximal Assistance - Patient 25 - 49%     Transfers Chair/bed transfer  Transfers assist     Chair/bed transfer assist level: Maximal Assistance - Patient 25 -  49%     Locomotion Ambulation   Ambulation assist      Assist level: 2 helpers Assistive device: Walker-rolling Max distance: 10 feet   Walk 10 feet activity   Assist     Assist level: 2 helpers Assistive device: Walker-rolling   Walk 50 feet activity   Assist Walk 50 feet with 2 turns activity did not occur: Safety/medical concerns         Walk 150 feet activity   Assist Walk 150 feet activity did not occur: Safety/medical concerns         Walk 10 feet on uneven surface  activity   Assist Walk 10 feet on uneven surfaces activity did not occur: Safety/medical concerns         Wheelchair     Assist  Will patient use wheelchair at discharge?:  (TBD)             Wheelchair 50 feet with 2 turns activity    Assist            Wheelchair 150 feet activity     Assist          Blood pressure 101/64, pulse (!) 104, temperature 98.4 F (36.9 C), temperature source Oral, resp. rate 18, height 5' 4"  (1.626 m), weight 54.5 kg, SpO2 98 %.  Medical Problem List and Plan: 1.  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  Antithrombotics: -DVT/anticoagulation:  Mechanical: Sequential compression devices, below knee Bilateral lower extremities              -will check with NSU when to allow Lovenox?             -antiplatelet therapy: N/A 3. Pain Management: OxyContin 20 mg bid with oxycodone prn. Continues to have a lot of pain due to back spasms--robaxin ineffective. Will change to flexeril 24m tid scheduled. Will decrease valium to 2 mg bid prn. - appeared that RN's not giving prns very often, so, will see what she needs  6/30- increase Valium to 5 mg BID and make scheduled; con't oxycontin but move to 6am and 6pm- and con't flexeril and oxy prn.q3 H prn  7/1: added Robaxin which she is asking for this morning. 4. Mood: LCSW to follow for evaluation and support.              -antipsychotic agents:  N/A 5. Neuropsych: This patient is capable of making decisions on her own behalf. 6. Skin/Wound Care: Monitor incisions for healing. Continue Juven to promote wound healing.  7. Fluids/Electrolytes/Nutrition: Monitor I/O. Check lytes in am.  8. Disseminated MSSA infection with osteomyelitis, epidural abscess and R septic shoulder: Cefepime X 6 weeks with weekly CBC/BMP/ESR/CRP-end date 06/03/20.  9. HTN: Monitor BP tid--on Norvasc and Lisinopril daily.   6/30- BP well controlled;   will monitor for orthostatic hypotension  7/1: Hypotensive. Stop Amlodipine.  10. Epidural abscess with diskitis: On IV antibiotics end date 06/03/20  11. Chronic pancreatitis: Abdominal symptoms have resolved. To follow with [facility] for Lap chole on outpatient basis.  12. Hypokalemia: Hypomagnesemia has resolved with supplement. Will continue to keep Kdur at 40 meq bid as tends to drop when decreased once a day. Will monitor lytes MWF for now.   6/30-  K+ much better 3.8- will check frequently 13. Acute on chronic anemia: H/H stable. Recheck in am.  14. Neurogenic bladder/Urinary retention: Remove foley in am and start bladder training.  6/30- remove foley today  15. Neurogenic bowel- will see if she needs bowel program- is likely since BM just "fell out" this AM when transferring- no control of it.   6/30- see if needs Bowel program  7/1: moving bowels regularly.      LOS: 2 days A FACE TO FACE EVALUATION WAS PERFORMED  Clide Deutscher Darcey Cardy 05/11/2020, 9:24 AM

## 2020-05-11 NOTE — Progress Notes (Signed)
Occupational Therapy Session Note  Patient Details  Name: Debra Schmidt MRN: [id number] Date of Birth: 05-25-1961  Today's Date: 05/11/2020 OT Individual Time: 1345-1410 OT Individual Time Calculation (min): 25 min  and Today's Date: 05/11/2020 OT Missed Time: 20 Minutes Missed Time Reason: Pain   Short Term Goals: Week 1:  OT Short Term Goal 1 (Week 1): Pt will perform toileting with min A overall. OT Short Term Goal 2 (Week 1): Pt will perform LB dressing with use of AE or modifcations with min A. OT Short Term Goal 3 (Week 1): Pt perform toilet transfer with min A. OT Short Term Goal 4 (Week 1): Pt will stand for 2 minutes with min A or less for balance during self care tasks.  Skilled Therapeutic Interventions/Progress Updates:    Pt resting in bed upon arrival.  IV tubing wrapped around LUE and call bell/remote cord. Once tubing unwrapped, attempted to engage pt in sitting EOB and donning LSO. Pt c/o "extreme" pain with all movements.  Attempted several times with rest breaks. Pt states she just can't "deal with pain well." Pt unable to come to EOB. Pt remained in bed with all needs within reach and bed alarm activated. Pt missed 20 mins skilled OT services.   Therapy Documentation Precautions:  Precautions Precautions: Fall, Back Precaution Booklet Issued: No Precaution Comments:  (Reviewed back precautions) Required Braces or Orthoses: Spinal Brace Spinal Brace: Lumbar corset, Applied in sitting position Restrictions Weight Bearing Restrictions: Yes RUE Weight Bearing: Weight bearing as tolerated Other Position/Activity Restrictions: no overhead movement RUE General: General OT Amount of Missed Time: 20 Minutes PT Missed Treatment Reason: Pain Pain:  Pt c/o 7/10 pain in mid back with all transitional movements; emotional support and repositioned   Therapy/Group: Individual Therapy  Rich Brave 05/11/2020, 2:32 PM

## 2020-05-12 ENCOUNTER — Inpatient Hospital Stay (HOSPITAL_COMMUNITY): Payer: Self-pay

## 2020-05-12 ENCOUNTER — Inpatient Hospital Stay (HOSPITAL_COMMUNITY): Payer: Self-pay | Admitting: Physical Therapy

## 2020-05-12 LAB — BASIC METABOLIC PANEL
Anion gap: 9 (ref 5–15)
BUN: 8 mg/dL (ref 6–20)
CO2: 26 mmol/L (ref 22–32)
Calcium: 9.4 mg/dL (ref 8.9–10.3)
Chloride: 100 mmol/L (ref 98–111)
Creatinine, Ser: 0.5 mg/dL (ref 0.44–1.00)
GFR calc Af Amer: >60 mL/min (ref >60)
GFR calc non Af Amer: >60 mL/min (ref >60)
Glucose, Bld: 124 mg/dL — ABNORMAL HIGH (ref 70–99)
Potassium: 4 mmol/L (ref 3.5–5.1)
Sodium: 135 mmol/L (ref 135–145)

## 2020-05-12 MED ORDER — ACETAMINOPHEN 325 MG PO TABS
650.0000 mg | ORAL_TABLET | Freq: Three times a day (TID) | ORAL | Status: DC
  Administered 2020-05-12 – 2020-06-04 (×70): 650 mg via ORAL
  Filled 2020-05-12 (×69): qty 2

## 2020-05-12 NOTE — Progress Notes (Signed)
[facility] PHYSICAL MEDICINE & REHABILITATION PROGRESS NOTE   Subjective/Complaints: Continues to have pain, but patient says it is better controlled. Taylor PT says pain is still limiting her participation in therapy. Taylor also notes that patient appeared more confused today.   ROS: Pt denies SOB, abd pain, CP, N/V/C/D, and vision changes  Objective:   VAS US LOWER EXTREMITY VENOUS (DVT)  Result Date: 05/11/2020  Lower Venous DVTStudy Indications: Immobility.  Risk Factors: None identified. Limitations: Poor ultrasound/tissue interface. Comparison Study: No prior studies. Performing Technologist: Gregory Collins RVT  Examination Guidelines: A complete evaluation includes B-mode imaging, spectral Doppler, color Doppler, and power Doppler as needed of all accessible portions of each vessel. Bilateral testing is considered an integral part of a complete examination. Limited examinations for reoccurring indications may be performed as noted. The reflux portion of the exam is performed with the patient in reverse Trendelenburg.  +---------+---------------+---------+-----------+----------+-------------------+ RIGHT    CompressibilityPhasicitySpontaneityPropertiesThrombus Aging      +---------+---------------+---------+-----------+----------+-------------------+ CFV      Full           Yes      Yes                                      +---------+---------------+---------+-----------+----------+-------------------+ SFJ      Full                                                             +---------+---------------+---------+-----------+----------+-------------------+ FV Prox  Full                                                             +---------+---------------+---------+-----------+----------+-------------------+ FV Mid   Full                                                             +---------+---------------+---------+-----------+----------+-------------------+  FV DistalFull                                                             +---------+---------------+---------+-----------+----------+-------------------+ PFV      Full                                                             +---------+---------------+---------+-----------+----------+-------------------+ POP      Full           Yes      Yes                                      +---------+---------------+---------+-----------+----------+-------------------+   PTV                                                   patency shown with                                                        color doppler       +---------+---------------+---------+-----------+----------+-------------------+ PERO     Full                                                             +---------+---------------+---------+-----------+----------+-------------------+   +---------+---------------+---------+-----------+----------+--------------+ LEFT     CompressibilityPhasicitySpontaneityPropertiesThrombus Aging +---------+---------------+---------+-----------+----------+--------------+ CFV      Full           Yes      Yes                                 +---------+---------------+---------+-----------+----------+--------------+ SFJ      Full                                                        +---------+---------------+---------+-----------+----------+--------------+ FV Prox  Full                                                        +---------+---------------+---------+-----------+----------+--------------+ FV Mid   Full                                                        +---------+---------------+---------+-----------+----------+--------------+ FV DistalFull                                                        +---------+---------------+---------+-----------+----------+--------------+ PFV      Full                                                         +---------+---------------+---------+-----------+----------+--------------+ POP      Full           Yes      Yes                                 +---------+---------------+---------+-----------+----------+--------------+  PTV      Full                                                        +---------+---------------+---------+-----------+----------+--------------+ PERO     Full                                                        +---------+---------------+---------+-----------+----------+--------------+     Summary: RIGHT: - There is no evidence of deep vein thrombosis in the lower extremity. However, portions of this examination were limited- see technologist comments above.  - No cystic structure found in the popliteal fossa.  LEFT: - There is no evidence of deep vein thrombosis in the lower extremity.  - No cystic structure found in the popliteal fossa.  *See table(s) above for measurements and observations. Electronically signed by Brandon Cain MD on 05/11/2020 at 4:33:12 PM.    Final    Recent Labs    05/10/20 0500  WBC 5.7  HGB 8.1*  HCT 27.1*  PLT 512*   Recent Labs    05/10/20 0500 05/12/20 0426  NA 138 135  K 3.8 4.0  CL 102 100  CO2 28 26  GLUCOSE 131* 124*  BUN 9 8  CREATININE 0.45 0.50  CALCIUM 9.5 9.4    Intake/Output Summary (Last 24 hours) at 05/12/2020 1202 Last data filed at 05/12/2020 1045 Gross per 24 hour  Intake 240 ml  Output 1650 ml  Net -1410 ml     Physical Exam: Vital Signs Blood pressure 98/65, pulse 95, temperature 98.2 F (36.8 C), temperature source Oral, resp. rate 18, height 5' 4" (1.626 m), weight 54.5 kg, SpO2 97 %. General: Alert and oriented x 3, No apparent distress HEENT: Head is normocephalic, atraumatic, PERRLA, EOMI, sclera anicteric, oral mucosa pink and moist, poor dentition, ext ear canals clear, Muscles in upper traps and rhomboids tight still B/L Cardiovascular: RRR Pulmonary:  CTA B/L Abdominal: Soft, NT,  ND, (+)BS  Genitourinary:    Comments: Foley in place draining medium amber urine- still in place Musculoskeletal:  UEs- Deltoids, biceps 4/5 B/L; Triceps, WE, grip and finger abd 4-/5 B/L LEs- HF 2+/5 on R; 2/5 on L; KE 2+/5, DF 2/5, and PF 3/5 B/L Skin:incision looks great- no drainage, no erythema- a little puffy, but good PICC line LUE  Neurological: Ox3 No spasticity seen in LEs Notes that sensation intact to light touch in all 4 extremities-? Psychiatric:        Mood and Affect: still anxious due to pain  Assessment/Plan: 1. 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  Physiatrist is providing close team supervision and 24 hour management of active medical problems listed below.  Physiatrist and rehab team continue to assess barriers to discharge/monitor patient progress toward functional and medical goals  Care Tool:  Bathing    Body parts bathed by patient: Chest, Abdomen, Front perineal area, Right upper leg, Left upper leg, Face   Body parts bathed by helper: Buttocks, Right arm, Left arm, Right lower   leg, Left lower leg     Bathing assist Assist Level: Moderate Assistance - Patient 50 - 74%     Upper Body Dressing/Undressing Upper body dressing   What is the patient wearing?: Hospital gown only    Upper body assist Assist Level: Maximal Assistance - Patient 25 - 49%    Lower Body Dressing/Undressing Lower body dressing      What is the patient wearing?: Incontinence brief     Lower body assist Assist for lower body dressing: Maximal Assistance - Patient 25 - 49%     Toileting Toileting    Toileting assist Assist for toileting: Moderate Assistance - Patient 50 - 74%     Transfers Chair/bed transfer  Transfers assist     Chair/bed transfer assist level: Maximal Assistance -  Patient 25 - 49%     Locomotion Ambulation   Ambulation assist      Assist level: Moderate Assistance - Patient 50 - 74% Assistive device: Walker-rolling Max distance: 8 feet   Walk 10 feet activity   Assist     Assist level: 2 helpers Assistive device: Walker-rolling   Walk 50 feet activity   Assist Walk 50 feet with 2 turns activity did not occur: Safety/medical concerns         Walk 150 feet activity   Assist Walk 150 feet activity did not occur: Safety/medical concerns         Walk 10 feet on uneven surface  activity   Assist Walk 10 feet on uneven surfaces activity did not occur: Safety/medical concerns         Wheelchair     Assist Will patient use wheelchair at discharge?:  (TBD)             Wheelchair 50 feet with 2 turns activity    Assist            Wheelchair 150 feet activity     Assist          Blood pressure 98/65, pulse 95, temperature 98.2 F (36.8 C), temperature source Oral, resp. rate 18, height 5' 4" (1.626 m), weight 54.5 kg, SpO2 97 %.  Medical Problem List and Plan: 1.  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  Antithrombotics: -DVT/anticoagulation:  Mechanical: Sequential compression devices, below knee Bilateral lower extremities   -will check with NSU when to allow Lovenox? Will follow up on this with Dan PA/NSGY 7/2. Dopplers negative.              -antiplatelet therapy: N/A 3. Pain Management: OxyContin 20 mg bid with oxycodone prn. Continues to have a lot of pain due to back spasms--robaxin ineffective. Will change to flexeril 5mg tid scheduled. Will decrease valium to 2 mg bid prn. - appeared that RN's not giving prns very often, so, will see what she needs  6/30- increase Valium to  5 mg BID and make scheduled; con't oxycontin but move to 6am and 6pm- and con't flexeril and oxy prn.q3 H prn  7/1: added Robaxin which she is asking for this morning.  7/2: Improved but still limiting therapy.. Schedule Tylenol.  4. Mood: LCSW to follow for evaluation and support.              -  antipsychotic agents:  N/A 5. Neuropsych: This patient is capable of making decisions on her own behalf. 6. Skin/Wound Care: Monitor incisions for healing. Continue Juven to promote wound healing.  7. Fluids/Electrolytes/Nutrition: Monitor I/O. Check lytes in am.  8. Disseminated MSSA infection with osteomyelitis, epidural abscess and R septic shoulder: Cefepime X 6 weeks with weekly CBC/BMP/ESR/CRP-end date 06/03/20.  9. HTN: Monitor BP tid--on Norvasc and Lisinopril daily.   6/30- BP well controlled;  will monitor for orthostatic hypotension  7/1: Hypotensive. Stop Amlodipine.   7/2: Hypotensive: Stop Lisinopril.  10. Epidural abscess with diskitis: On IV antibiotics end date 06/03/20  11. Chronic pancreatitis: Abdominal symptoms have resolved. To follow with [facility] for Lap chole on outpatient basis.  12. Hypokalemia: Hypomagnesemia has resolved with supplement. Will continue to keep Kdur at 40 meq bid as tends to drop when decreased once a day. Will monitor lytes MWF for now.   6/30- K+ much better 3.8- will check frequently 13. Acute on chronic anemia: H/H stable. Recheck in am.  14. Neurogenic bladder/Urinary retention: Remove foley in am and start bladder training.  6/30- remove foley today  15. Neurogenic bowel- will see if she needs bowel program- is likely since BM just "fell out" this AM when transferring- no control of it.   6/30- see if needs Bowel program  7/1: moving bowels regularly.      LOS: 3 days A FACE TO FACE EVALUATION WAS PERFORMED  Martha Clan P Tyquasia Pant 05/12/2020, 12:02 PM

## 2020-05-12 NOTE — IPOC Note (Signed)
Overall Plan of Care [facility]) Patient Details Name: Debra Schmidt MRN: [id number] DOB: Jul 04, 1961  Admitting Diagnosis: Incomplete paraplegia [facility])  Hospital Problems: Principal Problem:   Incomplete paraplegia [facility]) Active Problems:   Abscess in epidural space of lumbar spine   Neurogenic bladder   Neurogenic bowel     Functional Problem List: Nursing Bladder, Bowel, Edema, Endurance, Medication Management, Pain, Safety, Sensory, Skin Integrity  PT Balance, Endurance, Motor, Pain, Skin Integrity, Safety  OT Balance, Endurance, Motor, Pain, Safety, Sensory  SLP    TR         Basic ADL's: OT Bathing, Dressing, Toileting     Advanced  ADL's: OT       Transfers: PT Bed Mobility, Bed to Chair, Car, Furniture, Civil Service fast streamer, Research scientist (life sciences): PT Ambulation, Psychologist, prison and probation services, Stairs     Additional Impairments: OT None  SLP        TR      Anticipated Outcomes Item Anticipated Outcome  Self Feeding n/a  Swallowing      Basic self-care  supervision  Toileting  supervision   Bathroom Transfers supervision  Bowel/Bladder  Min assist  Transfers  Supervision  Locomotion  Supervision with LRAD  Communication     Cognition     Pain  <4 on a 0-10 pain scale  Safety/Judgment  min assist   Therapy Plan: PT Intensity: Minimum of 1-2 x/day ,45 to 90 minutes PT Frequency: 5 out of 7 days PT Duration Estimated Length of Stay: 14-17 days OT Intensity: Minimum of 1-2 x/day, 45 to 90 minutes OT Frequency: 5 out of 7 days OT Duration/Estimated Length of Stay: 14-17 days     Due to the current state of emergency, patients may not be receiving their 3-hours of Medicare-mandated therapy.   Team Interventions: Nursing Interventions Patient/Family Education, Bladder Management, Bowel Management, Pain Management, Medication Management, Skin Care/Wound Management, Discharge Planning, Psychosocial Support  PT interventions Ambulation/gait training,  Warden/ranger, Community reintegration, Fish farm manager, Functional mobility training, Neuromuscular re-education, Pain management, Patient/family education, Stair training, Therapeutic Activities, Therapeutic Exercise, UE/LE Coordination activities, UE/LE Strength taining/ROM  OT Interventions Warden/ranger, Self Care/advanced ADL retraining, UE/LE Coordination activities, Functional mobility training, Firefighter, Wheelchair propulsion/positioning, Discharge planning, Therapeutic Activities, Pain management, Disease mangement/prevention, Patient/family education, Therapeutic Exercise, DME/adaptive equipment instruction, Psychosocial support, UE/LE Strength taining/ROM  SLP Interventions    TR Interventions    SW/CM Interventions Discharge Planning, Psychosocial Support, Patient/Family Education   Barriers to Discharge MD  Medical stability  Nursing Inaccessible home environment, Decreased caregiver support, Home environment access/layout, Neurogenic Bowel & Bladder, Wound Care, Lack of/limited family support, IV antibiotics Lack of insurance  PT Medical stability, Home environment access/layout, Weight bearing restrictions    OT Other (comments) none known at this time  SLP      SW       Team Discharge Planning: Destination: PT-Home ,OT- Home , SLP-  Projected Follow-up: PT-Home health PT, OT-  Home health OT, 24 hour supervision/assistance, SLP-  Projected Equipment Needs: PT-Other (comment) (Pt already obtained RW), OT- To be determined, SLP-  Equipment Details: PT- , OT-  Patient/family involved in discharge planning: PT- Patient,  OT-Patient, SLP-   MD ELOS: 3-4 weeks Medical Rehab Prognosis:  Good Assessment: [name] is a 59 year old woman who is admitted to CIR for impaired functionsecondary toincomplete ASIA C paraplegia due to osteomyelitis and epidural abscess with neurogenic bowel and bladder. [place] negative for any  clots. Still need NSGY  clearance to start Lovenox. Pain has been well controlled. She has been hypotensive and hypertensives have been stopped.    See Team Conference Notes for weekly updates to the plan of care

## 2020-05-12 NOTE — Progress Notes (Signed)
Occupational Therapy Session Note  Patient Details  Name: ALEXSYS ESKIN MRN: [id number] Date of Birth: [birth year]  Today's Date: 05/12/2020 OT Individual Time: 1520-1545 OT Individual Time Calculation (min): 25 min  and Today's Date: 05/12/2020 OT Missed Time: 15 Minutes Missed Time Reason: Pain   Short Term Goals: Week 1:  OT Short Term Goal 1 (Week 1): Pt will perform toileting with min A overall. OT Short Term Goal 2 (Week 1): Pt will perform LB dressing with use of AE or modifcations with min A. OT Short Term Goal 3 (Week 1): Pt perform toilet transfer with min A. OT Short Term Goal 4 (Week 1): Pt will stand for 2 minutes with min A or less for balance during self care tasks.  Skilled Therapeutic Interventions/Progress Updates:    1:1. Pt received in bed agreeable to OT after aroused from light sleep. Pt requesting to call husband, "because he is on his way up here." Pt unable to recall phone number despite multiple attempts. When given written number, pt still unable to attend long enough to place phone call. When OT dials number, pt begins conversation with husband and seems surprised when OT states, "we need to start doing therapy" as if pt forgot who OT was. Pt agreeable to getting to EOB requiring 4 attempts and A for trunk elevation with only motivation to get to [facility]. Once EOB with LSO applied pt states pain is too severe and lies back down after OT removes LSO. Pt provided with repositioning and ice for pain relief. RN alerted to pain. Exited session with pt seated in bed, exit alarm on and call light in reach  Therapy Documentation Precautions:  Precautions Precautions: Fall, Back Precaution Booklet Issued: No Precaution Comments:  (Reviewed back precautions) Required Braces or Orthoses: Spinal Brace Spinal Brace: Lumbar corset, Applied in sitting position Restrictions Weight Bearing Restrictions: No RUE Weight Bearing: Weight bearing as tolerated Other Position/Activity  Restrictions: no overhead movement RUE General: General OT Amount of Missed Time: 30 Minutes Vital Signs: Therapy Vitals Temp: 98.5 F (36.9 C) Pulse Rate: 95 Resp: 16 BP: 101/68 Oxygen Therapy SpO2: 100 % O2 Device: Room Air Pain:   ADL:   Vision   Perception    Praxis   Exercises:   Other Treatments:     Therapy/Group: Individual Therapy  Shon Hale 05/12/2020, 3:52 PM

## 2020-05-12 NOTE — Progress Notes (Signed)
Physical Therapy Session Note  Patient Details  Name: Debra Schmidt MRN: [id number] Date of Birth: [birth year]  Today's Date: 05/12/2020 PT Individual Time: 0800-0900 PT Individual Time Calculation (min): 60 min   Short Term Goals: Week 1:  PT Short Term Goal 1 (Week 1): Pt will perform bed mobility with min A PT Short Term Goal 2 (Week 1): Pt will perform transfers with min A PT Short Term Goal 3 (Week 1): Pt will ambulate 50 feet with RW and min A PT Short Term Goal 4 (Week 1): Pt will initiate stair training  Skilled Therapeutic Interventions/Progress Updates:  Pt received semi-reclined in bed finishing breakfast. Agreeable to therapy. States low back pain is a 6/10 at rest in bed prior to starting therapy session. Addressed patient's pain with positioning and frequent rest breaks throughout session. Reviewed spinal precautions with pt and left sign in room listing precautions as pt frequently forgets them. B/L SCDs removed by therapist at bed level. Pt performs bed mobility with min A throughout session, with verbal cues required for hand placement on bed rail. Lumbar corset donned with pt seated at EOB by therapist. Stand pivot transfer EOB <> W/C with RW and min A. Verbal cues required for pt to correct anterior trunk lean. Session focus on out of bed tolerance and therapy participation within pain limits. Seated in W/C in front of mirror, helped pt comb her hair and also assisted her in using no rinse shower cap. Pt tolerated sitting in W/C for 30 minutes during session. Stand pivot transfer W/C to EOB with RW and min A. Lumbar corset doffed with pt seated at EOB by therapist. Sit to supine with min A. Pt left semi-reclined in bed with all needs in reach. Dr. Carlis Abbott entered room at end of session- informed her that pt appeared more confused than baseline today based on remarks throughout session. Pt's participation was again significantly limited d/t low back pain.  Therapy  Documentation Precautions:  Precautions Precautions: Fall, Back Precaution Booklet Issued: No Precaution Comments:  (Reviewed back precautions) Required Braces or Orthoses: Spinal Brace Spinal Brace: Lumbar corset, Applied in sitting position Restrictions Weight Bearing Restrictions: No RUE Weight Bearing: Weight bearing as tolerated Other Position/Activity Restrictions: no overhead movement RUE  Therapy/Group: Individual Therapy  Murrell Redden, SPT  05/12/2020, 12:23 PM

## 2020-05-12 NOTE — Plan of Care (Signed)
°  Problem: Consults Goal: RH SPINAL CORD INJURY PATIENT EDUCATION Description:  See Patient Education module for education specifics.  Outcome: Progressing Goal: Skin Care Protocol Initiated - if Braden Score 18 or less Description: If consults are not indicated, leave blank or document N/A Outcome: Progressing Goal: Nutrition Consult-if indicated Outcome: Progressing   Problem: SCI BOWEL ELIMINATION Goal: RH STG MANAGE BOWEL WITH ASSISTANCE Description: STG Manage Bowel with max/total Assistance. Outcome: Progressing Goal: RH STG SCI MANAGE BOWEL WITH MEDICATION WITH ASSISTANCE Description: STG SCI Manage bowel with medication with max/total assistance. Outcome: Progressing Goal: RH STG SCI MANAGE BOWEL PROGRAM W/ASSIST OR AS APPROPRIATE Description: STG SCI Manage bowel program w/max/total assist or as appropriate. Outcome: Progressing   Problem: SCI BLADDER ELIMINATION Goal: RH STG MANAGE BLADDER WITH ASSISTANCE Description: STG Manage Bladder With min Assistance Outcome: Progressing Goal: RH STG MANAGE BLADDER WITH MEDICATION WITH ASSISTANCE Description: STG Manage Bladder With Medication With min Assistance. Outcome: Progressing   Problem: RH SKIN INTEGRITY Goal: RH STG SKIN FREE OF INFECTION/BREAKDOWN Description: Skin to remain free from infection/breakdown while on rehab with min assist from staff. Outcome: Progressing Goal: RH STG MAINTAIN SKIN INTEGRITY WITH ASSISTANCE Description: STG Maintain Skin Integrity With min Assistance. Outcome: Progressing Goal: RH STG ABLE TO PERFORM INCISION/WOUND CARE W/ASSISTANCE Description: STG Able To Perform Incision/Wound Care With mod Assistance. Outcome: Progressing   Problem: RH SAFETY Goal: RH STG ADHERE TO SAFETY PRECAUTIONS W/ASSISTANCE/DEVICE Description: STG Adhere to Safety Precautions With min Assistance and appropriate assistive Device. Outcome: Progressing   Problem: RH PAIN MANAGEMENT Goal: RH STG PAIN MANAGED  AT OR BELOW PT'S PAIN GOAL Description: <4 on a 0-10 pain scale. Outcome: Progressing   Problem: RH KNOWLEDGE DEFICIT SCI Goal: RH STG INCREASE KNOWLEDGE OF SELF CARE AFTER SCI Description: Patient will demonstrate knowledge of medication management, bladder program, bowel program, safety precautions, and follow up care with the MD post discharge with min assist from CIR staff. Outcome: Progressing

## 2020-05-12 NOTE — Progress Notes (Signed)
Occupational Therapy Session Note  Patient Details  Name: Debra Schmidt MRN: [id number] Date of Birth: 08-27-1961  Today's Date: 05/12/2020 OT Individual Time: 1345-1400 OT Individual Time Calculation (min): 15 min  and Today's Date: 05/12/2020 OT Missed Time: 30 Minutes Missed Time Reason: Pain   Short Term Goals: Week 1:  OT Short Term Goal 1 (Week 1): Pt will perform toileting with min A overall. OT Short Term Goal 2 (Week 1): Pt will perform LB dressing with use of AE or modifcations with min A. OT Short Term Goal 3 (Week 1): Pt perform toilet transfer with min A. OT Short Term Goal 4 (Week 1): Pt will stand for 2 minutes with min A or less for balance during self care tasks.  Skilled Therapeutic Interventions/Progress Updates:    Pt resting in bed with eyes closed. Easily aroused.  OT intervention with focus on bed mobility and sitting balance.  Initiated rolling in bed in preparation for sidelying to sit. Pt with increased pain with movement and unable to come to seated EOB position.  Pt unable to continue and returned to supine position. Pt repositioned to relieve pain. Pt missed 30 mins skilled OT services.  Bed alarm activated and all needs within reach.   Therapy Documentation Precautions:  Precautions Precautions: Fall, Back Precaution Booklet Issued: No Precaution Comments:  (Reviewed back precautions) Required Braces or Orthoses: Spinal Brace Spinal Brace: Lumbar corset, Applied in sitting position Restrictions Weight Bearing Restrictions: No RUE Weight Bearing: Weight bearing as tolerated Other Position/Activity Restrictions: no overhead movement RUE General: General OT Amount of Missed Time: 30 Minutes   Therapy/Group: Individual Therapy  Rich Brave 05/12/2020, 2:33 PM

## 2020-05-12 NOTE — Progress Notes (Signed)
Occupational Therapy Session Note  Patient Details  Name: Debra Schmidt MRN: [id number] Date of Birth: Feb 18, 1961  Today's Date: 05/12/2020 OT Individual Time: 1100-1135 OT Individual Time Calculation (min): 35 min  and Today's Date: 05/12/2020 OT Missed Time: 25 Minutes Missed Time Reason: Pain   Short Term Goals: Week 1:  OT Short Term Goal 1 (Week 1): Pt will perform toileting with min A overall. OT Short Term Goal 2 (Week 1): Pt will perform LB dressing with use of AE or modifcations with min A. OT Short Term Goal 3 (Week 1): Pt perform toilet transfer with min A. OT Short Term Goal 4 (Week 1): Pt will stand for 2 minutes with min A or less for balance during self care tasks.  Skilled Therapeutic Interventions/Progress Updates:    Pt resting in bed upon arrival.  Pt reports that her pain is ok when laying still.  Pt agreeable to try sitting EOB. Pt recalls 2/3 back precautions but requires max verbal cues during supine>sit. Pt unable to complete 3 attempts at supine>sit EOB secondary to "terrible" pain. Reviewed POC. Emotional support provided. Pain management strategies discussed. Pt missed 25 mins skilled OT services. Pt remained in bed with all needs within reach and bed alarm activated.   Therapy Documentation Precautions:  Precautions Precautions: Fall, Back Precaution Booklet Issued: No Precaution Comments:  (Reviewed back precautions) Required Braces or Orthoses: Spinal Brace Spinal Brace: Lumbar corset, Applied in sitting position Restrictions Weight Bearing Restrictions: No RUE Weight Bearing: Weight bearing as tolerated Other Position/Activity Restrictions: no overhead movement RUE General: General OT Amount of Missed Time: 25 Minutes   Pain:  Per pt report, pain is "terrible" when moving but "ok" when laying still   Therapy/Group: Individual Therapy  Rich Brave 05/12/2020, 11:48 AM

## 2020-05-12 NOTE — Progress Notes (Signed)
Physical Therapy Session Note  Patient Details  Name: Debra Schmidt MRN: [id number] Date of Birth: 1961-02-13  Today's Date: 05/12/2020 PT Individual Time: [phone] (make up time) PT Individual Time Calculation (min): 30 min     Short Term Goals: Week 1:  PT Short Term Goal 1 (Week 1): Pt will perform bed mobility with min A PT Short Term Goal 2 (Week 1): Pt will perform transfers with min A PT Short Term Goal 3 (Week 1): Pt will ambulate 50 feet with RW and min A PT Short Term Goal 4 (Week 1): Pt will initiate stair training  Skilled Therapeutic Interventions/Progress Updates:      Pt received supine in bed and agreeable to PT at bed level, but refusing any OOB therapy due to constant LPB/spasms. SAQ x 10, hip abduction with AAROM x 10. Ankle DF/PF with level 2 tband on the R with DF and AROM on the L x 12.  Heel slides x 8 BLE with AAROM. At end of session pt asking for assistance to scoot to [facility], but then attempting to sit up. Mid transfer, pt report ssevere spasm and returned to supine with mod assist. Pt left supine in bed with call  Bell in reach and all needs met.    Therapy Documentation Precautions:  Precautions Precautions: Fall, Back Precaution Booklet Issued: No Precaution Comments:  (Reviewed back precautions) Required Braces or Orthoses: Spinal Brace Spinal Brace: Lumbar corset, Applied in sitting position Restrictions Weight Bearing Restrictions: No RUE Weight Bearing: Weight bearing as tolerated Other Position/Activity Restrictions: no overhead movement RUE General:   Vital Signs: Therapy Vitals BP: 98/65 Pain: 9/10. LPB. Spasm Pt position adjusted.  Therapy/Group: Individual Therapy  Lorie Phenix 05/12/2020, 9:49 AM

## 2020-05-13 ENCOUNTER — Inpatient Hospital Stay (HOSPITAL_COMMUNITY): Payer: Self-pay

## 2020-05-13 ENCOUNTER — Inpatient Hospital Stay (HOSPITAL_COMMUNITY): Payer: Self-pay | Admitting: Physical Therapy

## 2020-05-13 MED ORDER — DIAZEPAM 2 MG PO TABS
2.0000 mg | ORAL_TABLET | Freq: Two times a day (BID) | ORAL | Status: DC
  Administered 2020-05-13 – 2020-05-16 (×6): 2 mg via ORAL
  Filled 2020-05-13 (×6): qty 1

## 2020-05-13 MED ORDER — DICLOFENAC EPOLAMINE 1.3 % EX PTCH
1.0000 | MEDICATED_PATCH | Freq: Two times a day (BID) | CUTANEOUS | Status: DC
  Administered 2020-05-13 – 2020-06-04 (×41): 1 via TRANSDERMAL
  Filled 2020-05-13 (×45): qty 1

## 2020-05-13 NOTE — Progress Notes (Signed)
Patient refuses to wear the brace.

## 2020-05-13 NOTE — Progress Notes (Signed)
   05/13/20 1417  Assess: MEWS Score  Temp 98.4 F (36.9 C)  BP 98/65  Pulse Rate (!) 103  Resp 16  SpO2 100 %  O2 Device Room Air  Assess: MEWS Score  MEWS Temp 0  MEWS Systolic 1  MEWS Pulse 1  MEWS RR 0  MEWS LOC 0  MEWS Score 2  MEWS Score Color Yellow  Assess: if the MEWS score is Yellow or Red  Were vital signs taken at a resting state? Yes  Focused Assessment Documented focused assessment  Early Detection of Sepsis Score *See Row Information* Medium  MEWS guidelines implemented *See Row Information* Yes  Treat  MEWS Interventions Administered prn meds/treatments  Take Vital Signs  Increase Vital Sign Frequency  Yellow: Q 2hr X 2 then Q 4hr X 2, if remains yellow, continue Q 4hrs  Escalate  MEWS: Escalate Yellow: discuss with charge nurse/RN and consider discussing with provider and RRT  Notify: Charge Nurse/RN  Name of Charge Nurse/RN Notified [employer] RN  Date Charge Nurse/RN Notified 05/13/20  Time Charge Nurse/RN Notified 1430

## 2020-05-13 NOTE — Progress Notes (Signed)
Patient refused suppository or enema.

## 2020-05-13 NOTE — Progress Notes (Signed)
Marlan Palau (sister-in-law)will be assisting patient when she is discharged.

## 2020-05-13 NOTE — Progress Notes (Signed)
Occupational Therapy Session Note  Patient Details  Name: Debra Schmidt MRN: [id number] Date of Birth: 12/11/1960  Today's Date: 05/13/2020 OT Individual Time: 9485-4627 OT Individual Time Calculation (min): 43 min    Short Term Goals: Week 1:  OT Short Term Goal 1 (Week 1): Pt will perform toileting with min A overall. OT Short Term Goal 2 (Week 1): Pt will perform LB dressing with use of AE or modifcations with min A. OT Short Term Goal 3 (Week 1): Pt perform toilet transfer with min A. OT Short Term Goal 4 (Week 1): Pt will stand for 2 minutes with min A or less for balance during self care tasks.  Skilled Therapeutic Interventions/Progress Updates:    1:1. Pt received in bed with family present encouraging pt to participate in tx. RN delivers pain medicaiton during session and pt continues to holler out in pain in all positions. Pt completes supine>sitting EOB with VC for log roll technique after assuming hook lying. Pt able to kick LEs off bed. Pt requires total A to don brace at EOB and able to sit up with MAX encouragement ~3 min before lying back down. Pt agrees to sit back up after break. After break pt with better participation and command following for supine>sitting EOB but continues to requires A for trunk elevation. Pt agreeable to put on pants. Total A to thread BLE into pants with VC for BUE use to pull pants up towards thighs. Pt sit to stand 2x with mod-min A with first stand maintaining crouched posture and second improving terminal knee and hip extension with MIN A and VC for wider BOS. Ice pack and repositioning provided at end of session for pain relief. Call light in reach and exit alarm on whle pt lying in supine.  Therapy Documentation Precautions:  Precautions Precautions: Fall, Back Precaution Booklet Issued: No Precaution Comments:  (Reviewed back precautions) Required Braces or Orthoses: Spinal Brace Spinal Brace: Lumbar corset, Applied in sitting  position Restrictions Weight Bearing Restrictions: No RUE Weight Bearing: Weight bearing as tolerated Other Position/Activity Restrictions: no overhead movement RUE General:   Vital Signs:   Pain: Pain Assessment Pain Scale: 0-10 Pain Score: 5  Pain Type: Surgical pain Pain Location: Back Pain Orientation: Lower Pain Descriptors / Indicators: Constant Pain Frequency: Constant Pain Onset: On-going Patients Stated Pain Goal: 4 Pain Intervention(s): Medication (See eMAR) ADL:   Vision   Perception    Praxis   Exercises:   Other Treatments:     Therapy/Group: Individual Therapy  Shon Hale 05/13/2020, 2:17 PM

## 2020-05-13 NOTE — Progress Notes (Signed)
Physical Therapy Session Note  Patient Details  Name: Debra Schmidt MRN: [id number] Date of Birth: 07/14/1961  Today's Date: 05/13/2020 PT Individual Time: 1014-1110 PT Individual Time Calculation (min): 56 min   and  Today's Date: 05/13/2020 PT Missed Time: 30 Minutes Missed Time Reason: Pain  Short Term Goals: Week 1:  PT Short Term Goal 1 (Week 1): Pt will perform bed mobility with min A PT Short Term Goal 2 (Week 1): Pt will perform transfers with min A PT Short Term Goal 3 (Week 1): Pt will ambulate 50 feet with RW and min A PT Short Term Goal 4 (Week 1): Pt will initiate stair training  Skilled Therapeutic Interventions/Progress Updates:    Session 1: Pt received supine in bed, drowsy/lethargic and talking about some event that happened yesterday but therapist unable to understand what the pt was describing - later pt reports she needs to call her husband so he can be here for "surgery" involving her "catheter" - therapist informed pt she does not currently have a catheter in. Pt reports she is going to go to [place] later to pick out new gowns - therapist reoriented pt to location and situation.  Performed supine B LE active assist (to maintain attention on task) heel slides x10 reps. With encouragement pt agreeable to attempt OOB mobility as she is sweaty and needs sheets changed. Supine>sitting L EOB with mod/max assist for rolling and trunk upright via logroll technique - requires slow movements and increased time to complete task - max cuing for breathing and relaxation for pain management. Sitting EOB initially requires min assist for trunk control and to assist with pain management. Donned lumbar corset total assist. MD in/out for morning assessment - notified him of pt's confusion - pt not oriented to place or time upon MD questioning. Sit>stand EOB>RW with min assist for lifting and balance in standing as pt has narrow BOS and increased postural sway. Stand pivot to w/c using RW with  min assist and pt demonstrating poor ability to laterally step with L LE and increased postural sway. Pt agreeable to remain sitting in w/c to promote increased upright OOB activity tolerance while therapist assisted with brushing hair as pt unable to perform overhead reaching with R UE due to restrictions - tolerated sitting in w/c for ~32minutes - transported pt to therapy gym for change of scenery. Sit>stand w/c>RW with min assist for lifting and balance. Attempted ambulation ~9ft to EOM using RW with pt ambulating ~34ft with min assist demonstrating very short step lengths with narrow BOS and increased trunk flexion but pt becoming fatigued resulting in a worsening crouched posture and max assist to maintain upright - therapist provided pt a seated rest break on therapist's thigh until +2 assist could provide w/c behind pt to sit as pt was not going to be able to safely walk the additional 85ft to the mat. Transported pt back to room in w/c and pt agreeable to remain sitting up until nursing staff completed changing bed linens. RN made aware of pt's position, increased pain with OOB mobility, and need for bed linen change then transfer back to bed. Pt left seated in w/c with needs in reach, lumbar corset on, and seat belt alarm on.  Session 2: Ed, RN reports having just given pt oxycodone. Upon therapist arrival to pt's room she is lying diagonally in the bed with her socks falling off and HOB flat. Pt reports that she just received tylenol and it is "taking the edge  off" and that the pain is "barely an ache" now that she had that and is finally comfortable in the bed. Upon inquiring if she wanted to participate in therapy, even at bed level, pt declines stating "I cant, I can't stand this pain." Therapist offers to assist with repositioning but pt reports she is comfortable. Pt left supine in bed with needs in reach and bed alarm on. Missed 30 minutes of skilled physical therapy.   Therapy  Documentation Precautions:  Precautions Precautions: Fall, Back Precaution Booklet Issued: No Precaution Comments:  (Reviewed back precautions) Required Braces or Orthoses: Spinal Brace Spinal Brace: Lumbar corset, Applied in sitting position Restrictions Weight Bearing Restrictions: No RUE Weight Bearing: Weight bearing as tolerated Other Position/Activity Restrictions: no overhead movement RUE  Pain:    Session 1: Reports pain increasing primarily with supine>sitting transition and that she is not comfortable in sitting for long durations - provided emotional support, distraction, and repositioning for pain management.  Therapy/Group: Individual Therapy  Ginny Forth, PT, DPT, CSRS  05/13/2020, 7:54 AM

## 2020-05-13 NOTE — Progress Notes (Addendum)
[place] PHYSICAL MEDICINE & REHABILITATION PROGRESS NOTE   Subjective/Complaints:  Doppler reviewed no DVT , per PT pt appeared sedated until transfer then pt c/o pain   ROS: Pt denies SOB, abd pain, CP, N/V/C/D, and vision changes  Objective:   VAS [place] LOWER EXTREMITY VENOUS (DVT)  Result Date: 05/11/2020  Lower Venous DVTStudy Indications: Immobility.  Risk Factors: None identified. Limitations: Poor ultrasound/tissue interface. Comparison Study: No prior studies. Performing Technologist: Oliver Hum RVT  Examination Guidelines: A complete evaluation includes B-mode imaging, spectral Doppler, color Doppler, and power Doppler as needed of all accessible portions of each vessel. Bilateral testing is considered an integral part of a complete examination. Limited examinations for reoccurring indications may be performed as noted. The reflux portion of the exam is performed with the patient in reverse Trendelenburg.  +---------+---------------+---------+-----------+----------+-------------------+ RIGHT    CompressibilityPhasicitySpontaneityPropertiesThrombus Aging      +---------+---------------+---------+-----------+----------+-------------------+ CFV      Full           Yes      Yes                                      +---------+---------------+---------+-----------+----------+-------------------+ SFJ      Full                                                             +---------+---------------+---------+-----------+----------+-------------------+ FV Prox  Full                                                             +---------+---------------+---------+-----------+----------+-------------------+ FV Mid   Full                                                             +---------+---------------+---------+-----------+----------+-------------------+ FV DistalFull                                                              +---------+---------------+---------+-----------+----------+-------------------+ PFV      Full                                                             +---------+---------------+---------+-----------+----------+-------------------+ POP      Full           Yes      Yes                                      +---------+---------------+---------+-----------+----------+-------------------+  PTV                                                   patency shown with                                                        color doppler       +---------+---------------+---------+-----------+----------+-------------------+ PERO     Full                                                             +---------+---------------+---------+-----------+----------+-------------------+   +---------+---------------+---------+-----------+----------+--------------+ LEFT     CompressibilityPhasicitySpontaneityPropertiesThrombus Aging +---------+---------------+---------+-----------+----------+--------------+ CFV      Full           Yes      Yes                                 +---------+---------------+---------+-----------+----------+--------------+ SFJ      Full                                                        +---------+---------------+---------+-----------+----------+--------------+ FV Prox  Full                                                        +---------+---------------+---------+-----------+----------+--------------+ FV Mid   Full                                                        +---------+---------------+---------+-----------+----------+--------------+ FV DistalFull                                                        +---------+---------------+---------+-----------+----------+--------------+ PFV      Full                                                         +---------+---------------+---------+-----------+----------+--------------+ POP      Full           Yes      Yes                                 +---------+---------------+---------+-----------+----------+--------------+   PTV      Full                                                        +---------+---------------+---------+-----------+----------+--------------+ PERO     Full                                                        +---------+---------------+---------+-----------+----------+--------------+     Summary: RIGHT: - There is no evidence of deep vein thrombosis in the lower extremity. However, portions of this examination were limited- see technologist comments above.  - No cystic structure found in the popliteal fossa.  LEFT: - There is no evidence of deep vein thrombosis in the lower extremity.  - No cystic structure found in the popliteal fossa.  *See table(s) above for measurements and observations. Electronically signed by Servando Snare MD on 05/11/2020 at 4:33:12 PM.    Final    No results for input(s): WBC, HGB, HCT, PLT in the last 72 hours. Recent Labs    05/12/20 0426  NA 135  K 4.0  CL 100  CO2 26  GLUCOSE 124*  BUN 8  CREATININE 0.50  CALCIUM 9.4    Intake/Output Summary (Last 24 hours) at 05/13/2020 1031 Last data filed at 05/13/2020 0141 Gross per 24 hour  Intake 432 ml  Output 1750 ml  Net -1318 ml     Physical Exam: Vital Signs Blood pressure 91/66, pulse 88, temperature 97.6 F (36.4 C), temperature source Oral, resp. rate 18, height '5\' 4"'$  (1.626 m), weight 54.4 kg, SpO2 99 %. General: Alert oriented to person "sept", "[place]"   General: No acute distress Mood and affect are appropriate Heart: Regular rate and rhythm no rubs murmurs or extra sounds Lungs: Clear to auscultation, breathing unlabored, no rales or wheezes Abdomen: Positive bowel sounds, soft nontender to palpation, nondistended Extremities: No clubbing, cyanosis, or  edema Skin: No evidence of breakdown, no evidence of rash, incsion healing well lumbar spine no drainage, moderate paraspinal tenderness  Neurologic: Cranial nerves II through XII intact, motor strength is 4/5 in bilateral deltoid, bicep, tricep, grip, hip flexor, knee extensors, ankle dorsiflexor and plantar flexor  Musculoskeletal: Full range of motion in all 4 extremities. No joint swelling Psychiatric:        Mood and Affect: still anxious due to pain  Assessment/Plan: 1. 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  Physiatrist is providing close team supervision and 24 hour management of active medical problems listed below.  Physiatrist and rehab team continue to assess barriers to discharge/monitor patient progress toward functional and medical goals  Care Tool:  Bathing    Body parts bathed by patient: Chest, Abdomen, Front perineal area, Right upper leg, Left upper leg, Face   Body parts bathed by helper: Buttocks, Right arm, Left arm, Right lower leg, Left lower leg     Bathing assist Assist Level: Moderate Assistance - Patient 50 - 74%     Upper Body Dressing/Undressing Upper body dressing   What is the patient  wearing?: Hospital gown only    Upper body assist Assist Level: Maximal Assistance - Patient 25 - 49%    Lower Body Dressing/Undressing Lower body dressing      What is the patient wearing?: Incontinence brief     Lower body assist Assist for lower body dressing: Maximal Assistance - Patient 25 - 49%     Toileting Toileting    Toileting assist Assist for toileting: Maximal Assistance - Patient 25 - 49%     Transfers Chair/bed transfer  Transfers assist     Chair/bed transfer assist level: Minimal Assistance - Patient > 75%     Locomotion Ambulation   Ambulation  assist      Assist level: Moderate Assistance - Patient 50 - 74% Assistive device: Walker-rolling Max distance: 8 feet   Walk 10 feet activity   Assist     Assist level: 2 helpers Assistive device: Walker-rolling   Walk 50 feet activity   Assist Walk 50 feet with 2 turns activity did not occur: Safety/medical concerns         Walk 150 feet activity   Assist Walk 150 feet activity did not occur: Safety/medical concerns         Walk 10 feet on uneven surface  activity   Assist Walk 10 feet on uneven surfaces activity did not occur: Safety/medical concerns         Wheelchair     Assist Will patient use wheelchair at discharge?:  (TBD)             Wheelchair 50 feet with 2 turns activity    Assist            Wheelchair 150 feet activity     Assist          Blood pressure 91/66, pulse 88, temperature 97.6 F (36.4 C), temperature source Oral, resp. rate 18, height '5\' 4"'$  (1.626 m), weight 54.4 kg, SpO2 99 %.  Medical Problem List and Plan: 1.  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  Antithrombotics: -DVT/anticoagulation:  Mechanical: Sequential compression devices, below knee Bilateral lower extremities   - Dopplers negative.              -antiplatelet therapy: N/A 3. Pain Management: OxyContin 20 mg bid with oxycodone prn. Continues to have a lot of pain due to back spasms--robaxin ineffective. Will change to flexeril '5mg'$  tid scheduled. Will decrease valium to 2 mg bid prn. - appeared that RN's not giving prns very often, so, will see what she needs  6/30- increase Valium to 5 mg BID and make scheduled; con't oxycontin but move to 6am and 6pm- and con't flexeril and oxy prn.q3 H prn  7/1: added Robaxin which she is  asking for this morning.  7/2: Improved but still limiting therapy.. Schedule Tylenol.  7/3 sedated and confused, will back down on Valium to '2mg'$   Flector patch to paraspinal area  4. Mood: LCSW to follow for evaluation and support.              -antipsychotic agents:  N/A 5. Neuropsych: This patient is capable of making decisions on her own behalf. 6. Skin/Wound Care: Monitor incisions for healing. Continue Juven to promote wound healing.  7. Fluids/Electrolytes/Nutrition:  Monitor I/O. Check lytes in am.  8. Disseminated MSSA infection with osteomyelitis, epidural abscess and R septic shoulder: Cefepime X 6 weeks with weekly CBC/BMP/ESR/CRP-end date 06/03/20.  9. HTN: Monitor BP tid--on Norvasc and Lisinopril daily.   6/30- BP well controlled;  will monitor for orthostatic hypotension  7/1: Hypotensive. Stop Amlodipine.   7/2: Hypotensive: Stop Lisinopril.  Vitals:   05/12/20 1425 05/12/20 1948  BP: 101/68 91/66  Pulse: 95 88  Resp: 16 18  Temp: 98.5 F (36.9 C) 97.6 F (36.4 C)  SpO2: 100% 99%  May improve with reduction in valium 10. Epidural abscess with diskitis: On IV antibiotics end date 06/03/20  11. Chronic pancreatitis: Abdominal symptoms have resolved. To follow with [facility] for Lap chole on outpatient basis.  12. Hypokalemia: Hypomagnesemia has resolved with supplement. Will continue to keep Kdur at 40 meq bid as tends to drop when decreased once a day. Will monitor lytes MWF for now.   6/30- K+ much better 3.8- will check frequently 13. Acute on chronic anemia: H/H stable. Recheck in am.  14. Neurogenic bladder/Urinary retention: Remove foley in am and start bladder training.  6/30- remove foley today  15. Neurogenic bowel- will see if she needs bowel program- is likely since BM just "fell out" this AM when transferring- no control of it.   6/30- see if needs Bowel program  7/1: moving bowels regularly.      LOS: 4 days A FACE TO FACE EVALUATION WAS PERFORMED  Charlett Blake 05/13/2020, 10:31 AM

## 2020-05-14 MED ORDER — BETHANECHOL CHLORIDE 10 MG PO TABS
10.0000 mg | ORAL_TABLET | Freq: Three times a day (TID) | ORAL | Status: DC
  Administered 2020-05-14 – 2020-05-20 (×18): 10 mg via ORAL
  Filled 2020-05-14 (×19): qty 1

## 2020-05-14 NOTE — Progress Notes (Signed)
[place] PHYSICAL MEDICINE & REHABILITATION PROGRESS NOTE   Subjective/Complaints:  Constipated but refused enema   ROS: Pt denies SOB, abd pain, CP, N/V/C/D, and vision changes  Objective:   No results found. No results for input(s): WBC, HGB, HCT, PLT in the last 72 hours. Recent Labs    05/12/20 0426  NA 135  K 4.0  CL 100  CO2 26  GLUCOSE 124*  BUN 8  CREATININE 0.50  CALCIUM 9.4    Intake/Output Summary (Last 24 hours) at 05/14/2020 0931 Last data filed at 05/14/2020 0926 Gross per 24 hour  Intake 948 ml  Output 3490 ml  Net -2542 ml     Physical Exam: Vital Signs Blood pressure 95/71, pulse 91, temperature 98.1 F (36.7 C), temperature source Oral, resp. rate 16, height _0  (1.626 m), weight 54.4 kg, SpO2 98 %. General: Alert oriented to person "sept", "[place]"   General: No acute distress Mood and affect are appropriate Heart: Regular rate and rhythm no rubs murmurs or extra sounds Lungs: Clear to auscultation, breathing unlabored, no rales or wheezes Abdomen: Positive bowel sounds, soft nontender to palpation, nondistended Extremities: No clubbing, cyanosis, or edema Skin: No evidence of breakdown, no evidence of rash   Musculoskeletal: Full range of motion in all 4 extremities. No joint swelling Psychiatric:        Mood and Affect: still anxious due to pain  Assessment/Plan: 1. 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  Physiatrist is providing close team supervision and 24 hour management of active medical problems listed below.  Physiatrist and rehab team continue to assess barriers to discharge/monitor patient progress toward functional and medical goals  Care Tool:  Bathing    Body parts bathed by patient: Chest, Abdomen, Front perineal area, Right upper leg, Left  upper leg, Face   Body parts bathed by helper: Buttocks, Right arm, Left arm, Right lower leg, Left lower leg     Bathing assist Assist Level: Moderate Assistance - Patient 50 - 74%     Upper Body Dressing/Undressing Upper body dressing   What is the patient wearing?: Hospital gown only    Upper body assist Assist Level: Maximal Assistance - Patient 25 - 49%    Lower Body Dressing/Undressing Lower body dressing      What is the patient wearing?: Incontinence brief     Lower body assist Assist for lower body dressing: Maximal Assistance - Patient 25 - 49%     Toileting Toileting    Toileting assist Assist for toileting: Maximal Assistance - Patient 25 - 49%     Transfers Chair/bed transfer  Transfers assist     Chair/bed transfer assist level: Minimal Assistance - Patient > 75%     Locomotion Ambulation   Ambulation assist      Assist level: 2 helpers Assistive device: Walker-rolling Max distance: 11f   Walk 10 feet activity   Assist     Assist level: 2 helpers Assistive device: Walker-rolling   Walk 50 feet activity   Assist Walk 50 feet with 2 turns activity did not occur: Safety/medical concerns         Walk 150 feet activity   Assist Walk 150 feet activity did not occur: Safety/medical concerns         Walk 10 feet on uneven surface  activity   Assist Walk 10 feet  on uneven surfaces activity did not occur: Safety/medical concerns         Wheelchair     Assist Will patient use wheelchair at discharge?:  (TBD)             Wheelchair 50 feet with 2 turns activity    Assist            Wheelchair 150 feet activity     Assist          Blood pressure 95/71, pulse 91, temperature 98.1 F (36.7 C), temperature source Oral, resp. rate 16, height _0  (1.626 m), weight 54.4 kg, SpO2 98 %.  Medical Problem List and Plan: 1.  Impaired functionsecondary to incomplete ASIA C paraplegia due to  osteomyelitis and epidural abscess with neurogenic bowel and bladder             -patient may  Shower if covers incision      -ELOS/Goals: 3-4 weeks- [facility]- Supervision  -Continue CIR 2.  Antithrombotics: -DVT/anticoagulation:  Mechanical: Sequential compression devices, below knee Bilateral lower extremities   - Dopplers negative.              -antiplatelet therapy: N/A 3. Pain Management: OxyContin 20 mg bid with oxycodone prn. Continues to have a lot of pain due to back spasms--robaxin ineffective. Will change to flexeril 72m tid scheduled. Will decrease valium to 2 mg bid prn. - appeared that RN's not giving prns very often, so, will see what she needs  6/30- increase Valium to 5 mg BID and make scheduled; con't oxycontin but move to 6am and 6pm- and con't flexeril and oxy prn.q3 H prn  7/1: added Robaxin which she is asking for this morning.  7/2: Improved but still limiting therapy.. Schedule Tylenol.  7/3 sedated and confused, will back down on Valium to 266m- appears brighter and more alert today  Flector patch to paraspinal area  4. Mood: LCSW to follow for evaluation and support.              -antipsychotic agents:  N/A 5. Neuropsych: This patient is capable of making decisions on her own behalf. 6. Skin/Wound Care: Monitor incisions for healing. Continue Juven to promote wound healing.  7. Fluids/Electrolytes/Nutrition: Monitor I/O. Check lytes in am.  8. Disseminated MSSA infection with osteomyelitis, epidural abscess and R septic shoulder: Cefepime X 6 weeks with weekly CBC/BMP/ESR/CRP-end date 06/03/20.  9. HTN: Monitor BP tid--on Norvasc and Lisinopril daily.   6/30- BP well controlled;  will monitor for orthostatic hypotension  7/1: Hypotensive. Stop Amlodipine.   7/2: Hypotensive: Stop Lisinopril.  Vitals:   05/13/20 1948 05/14/20 0427  BP: 110/79 95/71  Pulse: 98 91  Resp: 18 16  Temp: 97.7 F (36.5 C) 98.1 F (36.7 C)  SpO2: 96% 98%  Soft BPs  May improve with  reduction in valium 10. Epidural abscess with diskitis: On IV antibiotics end date 06/03/20  11. Chronic pancreatitis: Abdominal symptoms have resolved. To follow with [facility] for Lap chole on outpatient basis.  12. Hypokalemia: Hypomagnesemia has resolved with supplement. Will continue to keep Kdur at 40 meq bid as tends to drop when decreased once a day. Will monitor lytes MWF for now.   6/30- K+ much better 3.8- will check frequently 13. Acute on chronic anemia: H/H stable. Recheck in am.  14. Neurogenic bladder/Urinary retention: Remove foley in am and start bladder training.  6/30- remove foley today  Requires I/O cath on flomax add low dose urecholine 1079m  TID - pt thinks "bladder is ok" 15. Neurogenic bowel- will see if she needs bowel program- is likely since BM just "fell out" this AM when transferring- no control of it.   6/30- see if needs Bowel program  7/1: moving bowels regularly.  7/4 cont of bowel     LOS: 5 days A FACE TO FACE EVALUATION WAS PERFORMED  Charlett Blake 05/14/2020, 9:31 AM

## 2020-05-15 ENCOUNTER — Inpatient Hospital Stay (HOSPITAL_COMMUNITY): Payer: Self-pay

## 2020-05-15 ENCOUNTER — Inpatient Hospital Stay (HOSPITAL_COMMUNITY): Payer: Self-pay | Admitting: Physical Therapy

## 2020-05-15 ENCOUNTER — Inpatient Hospital Stay (HOSPITAL_COMMUNITY): Payer: Medicaid Other

## 2020-05-15 LAB — CBC WITH DIFFERENTIAL/PLATELET
Abs Immature Granulocytes: 0.02 10*3/uL (ref 0.00–0.07)
Basophils Absolute: 0.1 10*3/uL (ref 0.0–0.1)
Basophils Relative: 1 %
Eosinophils Absolute: 0.3 10*3/uL (ref 0.0–0.5)
Eosinophils Relative: 6 %
HCT: 27.8 % — ABNORMAL LOW (ref 36.0–46.0)
Hemoglobin: 8.5 g/dL — ABNORMAL LOW (ref 12.0–15.0)
Immature Granulocytes: 0 %
Lymphocytes Relative: 23 %
Lymphs Abs: 1.2 10*3/uL (ref 0.7–4.0)
MCH: 26.8 pg (ref 26.0–34.0)
MCHC: 30.6 g/dL (ref 30.0–36.0)
MCV: 87.7 fL (ref 80.0–100.0)
Monocytes Absolute: 0.5 10*3/uL (ref 0.1–1.0)
Monocytes Relative: 9 %
Neutro Abs: 3.2 10*3/uL (ref 1.7–7.7)
Neutrophils Relative %: 61 %
Platelets: 417 10*3/uL — ABNORMAL HIGH (ref 150–400)
RBC: 3.17 MIL/uL — ABNORMAL LOW (ref 3.87–5.11)
RDW: 15 % (ref 11.5–15.5)
WBC: 5.3 10*3/uL (ref 4.0–10.5)
nRBC: 0 % (ref 0.0–0.2)

## 2020-05-15 LAB — BASIC METABOLIC PANEL
Anion gap: 10 (ref 5–15)
BUN: 12 mg/dL (ref 6–20)
CO2: 26 mmol/L (ref 22–32)
Calcium: 9.8 mg/dL (ref 8.9–10.3)
Chloride: 100 mmol/L (ref 98–111)
Creatinine, Ser: 0.53 mg/dL (ref 0.44–1.00)
GFR calc Af Amer: >60 mL/min (ref >60)
GFR calc non Af Amer: >60 mL/min (ref >60)
Glucose, Bld: 114 mg/dL — ABNORMAL HIGH (ref 70–99)
Potassium: 4.2 mmol/L (ref 3.5–5.1)
Sodium: 136 mmol/L (ref 135–145)

## 2020-05-15 LAB — SEDIMENTATION RATE: Sed Rate: 140 mm/hr — ABNORMAL HIGH (ref 0–22)

## 2020-05-15 LAB — C-REACTIVE PROTEIN: CRP: 4.9 mg/dL — ABNORMAL HIGH (ref <1.0)

## 2020-05-15 IMAGING — CT CT HEAD W/O CM
3 of 4 series · 13 of 47 positions shown, 15 images · non-contrast
Comparison: No pertinent prior studies available for comparison.
COMPARISON: No pertinent prior studies available for comparison.
COMPARISON: No pertinent prior studies available for comparison.

Addendum:
CLINICAL DATA: Provided history: Mental status change, persistent
or worsening effusion. Increased drowsiness.

EXAM:
CT HEAD WITHOUT CONTRAST
TECHNIQUE: Contiguous axial images were obtained from the base of the skull
through the vertex without intravenous contrast.

[Series 3: head without · axial · non-contrast · 0.45mm/px · z∈[-165,-50]mm · 7 of 31 slices shown, 9 images]
[im 4/31  brain]
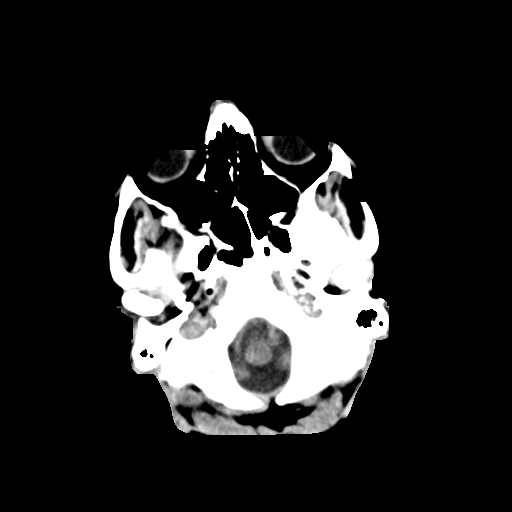
[im 4/31  bone]
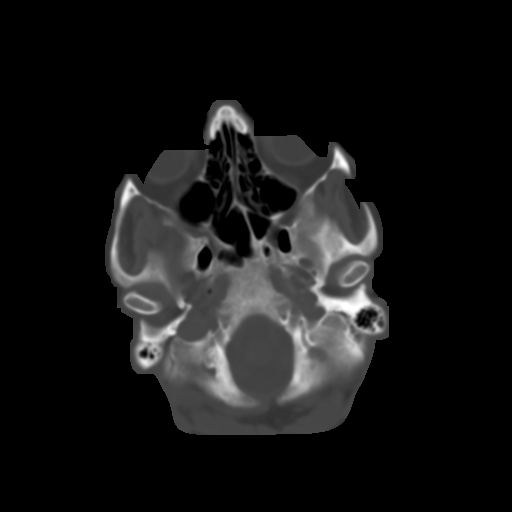
[im 8/31  brain]
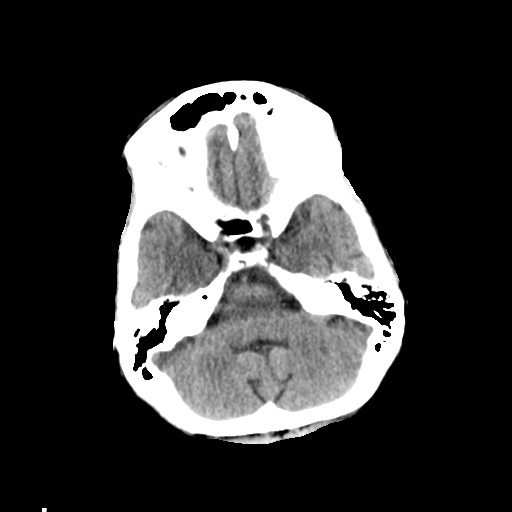
[im 12/31  brain]
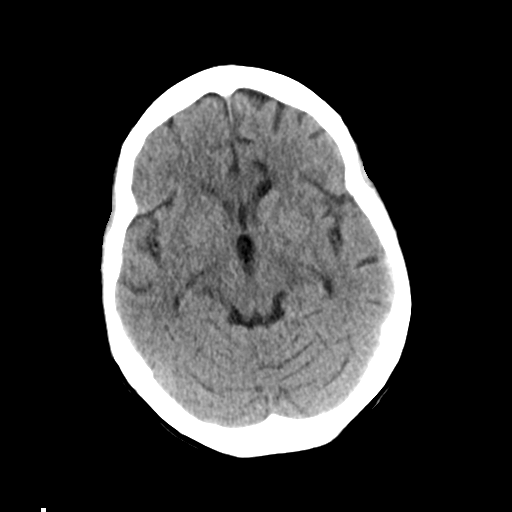
[im 16/31  brain]
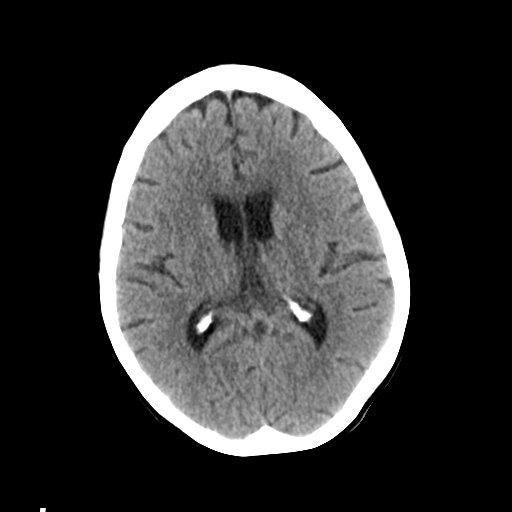
[im 19/31  brain]
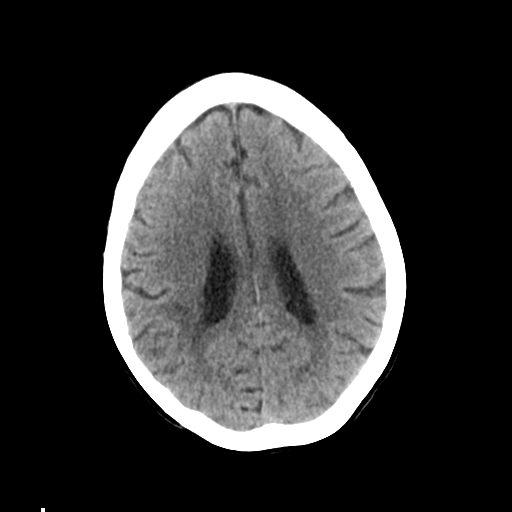
[im 19/31  bone]
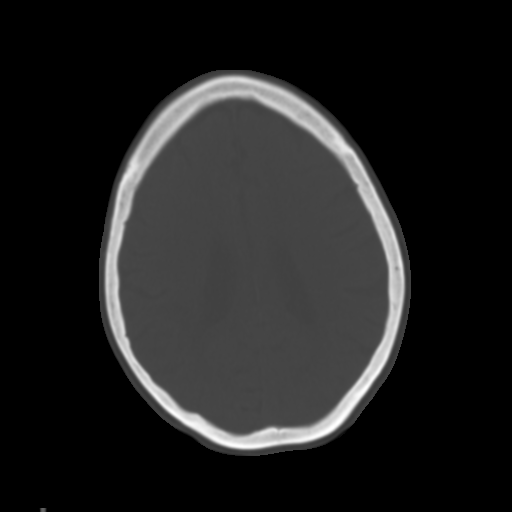
[im 23/31  brain]
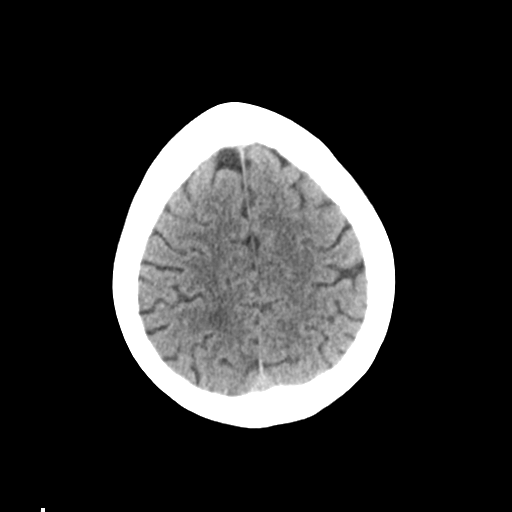
[im 27/31  brain]
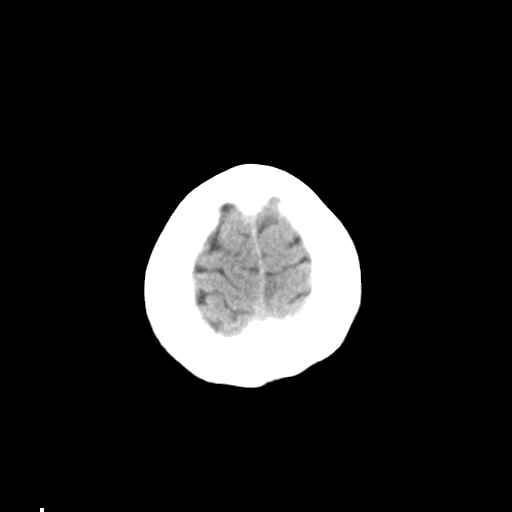

[Series 4: head bone · axial · 0.45mm/px · z∈[-166,-136]mm · 3 of 76 slices shown]
[im 8/76  bone]
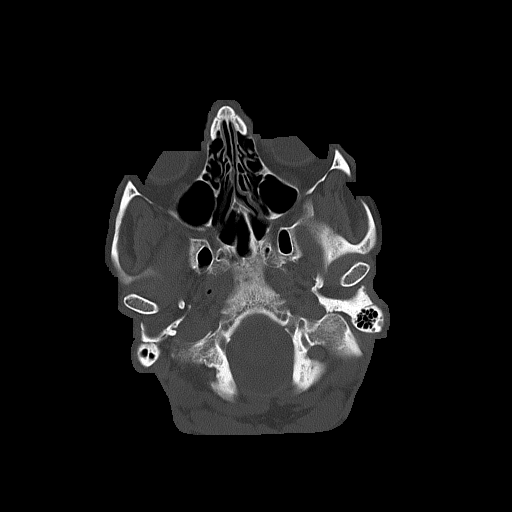
[im 16/76  bone]
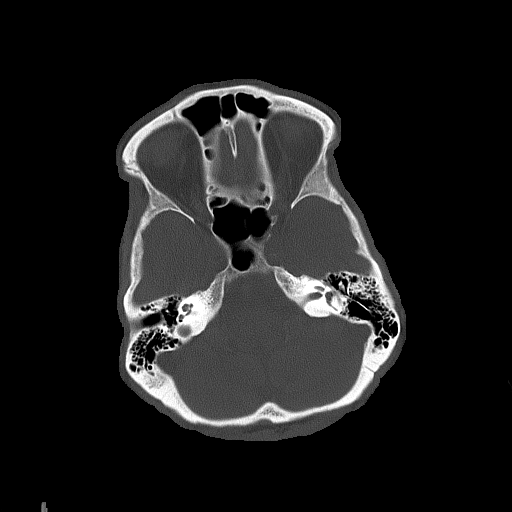
[im 23/76  bone]
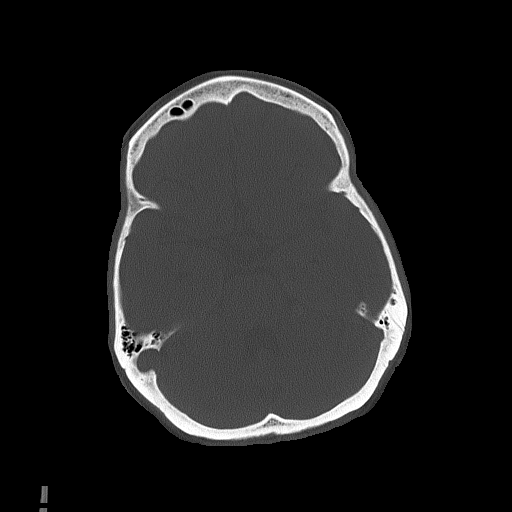

[Series 5: head without cor · coronal · non-contrast · 0.32mm/px · 3 of 67 slices shown]
[im 23/67  brain]
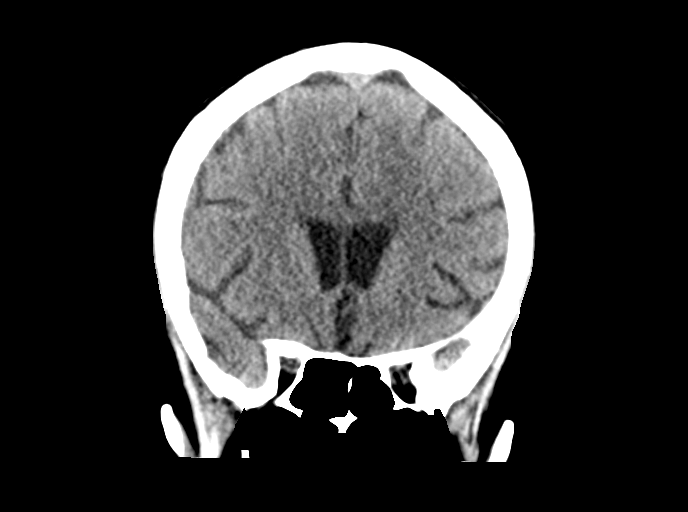
[im 30/67  brain]
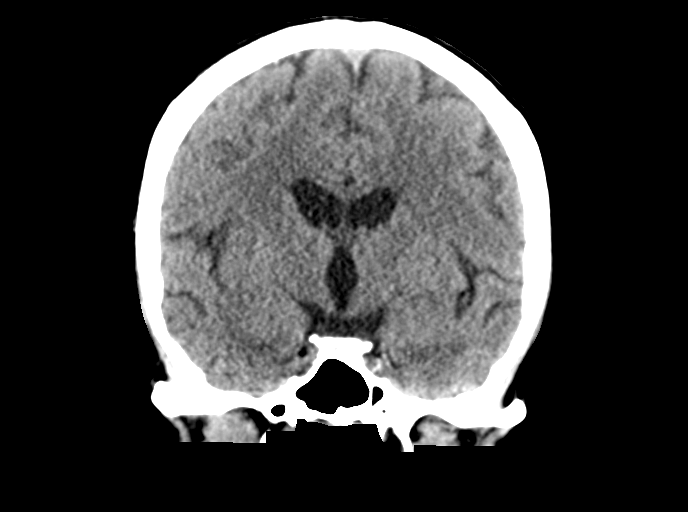
[im 37/67  brain]
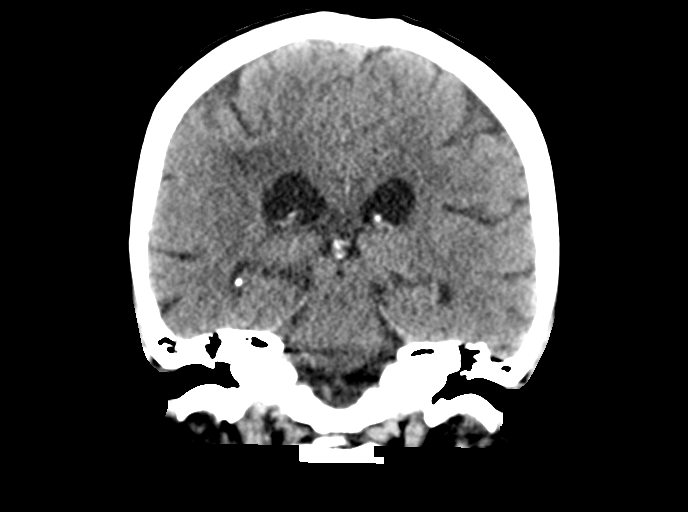

[13 of 47 positions shown; findings below may reference images not displayed]

FINDINGS: Brain:

There is a 3 cm focus of hypodensity within the right frontoparietal
white matter which is suspicious for recent white matter infarct or
other acute intracranial process.

Subtle ill-defined hypoattenuation is also present within the left
parietooccipital subcortical white matter.

7 mm focus of hypodensity within the anterior genu of the corpus
callosum/paramedian subcortical anterior left frontal lobe (series
3, image 12).

There is no acute intracranial hemorrhage.

No extra-axial fluid collection.

No evidence of intracranial mass.

No midline shift.

Vascular: No hyperdense vessel.

Skull: Normal. Negative for fracture or focal lesion.

Sinuses/Orbits: Visualized orbits show no acute finding. No
significant paranasal sinus disease or mastoid effusion at the
imaged levels.
IMPRESSION: 3 mm focus of ill-defined hypodensity within the right
frontoparietal white matter, which is suspicious for recent white
matter infarct or other acute intracranial process.
Contrast-enhanced brain MRI is recommended for further evaluation.

Additionally, there is subtle nonspecific ill-defined
hypoattenuation within the left parietooccipital subcortical white
matter. A fairly well circumscribed focus of hypoattenuation is
present within the anterior genu of the corpus callosum/paramedian
subcortical anterior left frontal lobe. These foci would also be
better characterized with MRI.

ADDENDUM:
These results were called by telephone at the time of interpretation
on [DATE] at [DATE] to provider NP VERDI, who verbally
acknowledged these results.

ADDENDUM:
Please note the first line of the impression should read: 3 cm focus
of ill-defined hypodensity within the right frontoparietal white
matter, which is suspicious for recent white matter infarct or other
acute intracranial process. Contrast-enhanced brain MRI is
recommended for further evaluation.

*** End of Addendum ***
Addendum:
FINDINGS: Brain:

There is a 3 cm focus of hypodensity within the right frontoparietal
white matter which is suspicious for recent white matter infarct or
other acute intracranial process.

Subtle ill-defined hypoattenuation is also present within the left
parietooccipital subcortical white matter.

7 mm focus of hypodensity within the anterior genu of the corpus
callosum/paramedian subcortical anterior left frontal lobe (series
3, image 12).

There is no acute intracranial hemorrhage.

No extra-axial fluid collection.

No evidence of intracranial mass.

No midline shift.

Vascular: No hyperdense vessel.

Skull: Normal. Negative for fracture or focal lesion.

Sinuses/Orbits: Visualized orbits show no acute finding. No
significant paranasal sinus disease or mastoid effusion at the
imaged levels.
IMPRESSION: 3 mm focus of ill-defined hypodensity within the right
frontoparietal white matter, which is suspicious for recent white
matter infarct or other acute intracranial process.
Contrast-enhanced brain MRI is recommended for further evaluation.

Additionally, there is subtle nonspecific ill-defined
hypoattenuation within the left parietooccipital subcortical white
matter. A fairly well circumscribed focus of hypoattenuation is
present within the anterior genu of the corpus callosum/paramedian
subcortical anterior left frontal lobe. These foci would also be
better characterized with MRI.

ADDENDUM:
These results were called by telephone at the time of interpretation
on [DATE] at [DATE] to provider NP VERDI, who verbally
acknowledged these results.

*** End of Addendum ***
FINDINGS: Brain:

There is a 3 cm focus of hypodensity within the right frontoparietal
white matter which is suspicious for recent white matter infarct or
other acute intracranial process.

Subtle ill-defined hypoattenuation is also present within the left
parietooccipital subcortical white matter.

7 mm focus of hypodensity within the anterior genu of the corpus
callosum/paramedian subcortical anterior left frontal lobe (series
3, image 12).

There is no acute intracranial hemorrhage.

No extra-axial fluid collection.

No evidence of intracranial mass.

No midline shift.

Vascular: No hyperdense vessel.

Skull: Normal. Negative for fracture or focal lesion.

Sinuses/Orbits: Visualized orbits show no acute finding. No
significant paranasal sinus disease or mastoid effusion at the
imaged levels.
IMPRESSION: 3 mm focus of ill-defined hypodensity within the right
frontoparietal white matter, which is suspicious for recent white
matter infarct or other acute intracranial process.
Contrast-enhanced brain MRI is recommended for further evaluation.

Additionally, there is subtle nonspecific ill-defined
hypoattenuation within the left parietooccipital subcortical white
matter. A fairly well circumscribed focus of hypoattenuation is
present within the anterior genu of the corpus callosum/paramedian
subcortical anterior left frontal lobe. These foci would also be
better characterized with MRI.

## 2020-05-15 MED ORDER — GABAPENTIN 600 MG PO TABS
300.0000 mg | ORAL_TABLET | Freq: Three times a day (TID) | ORAL | Status: DC
  Administered 2020-05-15 – 2020-05-17 (×7): 300 mg via ORAL
  Filled 2020-05-15 (×7): qty 1

## 2020-05-15 NOTE — Progress Notes (Signed)
Received a call from Dr Renette Butters Radiologist at 21:45 pm, regarding Debra Schmidt  CT Head Results, he recommended a MRI Brain with Contrast. This provider placed a call to Dr Carlis Abbott regarding the above, awaiting a return call.  This provider contacted Dr Riley Kill regarding the above. Dr Riley Kill ordered MRI with contrast.

## 2020-05-15 NOTE — Progress Notes (Signed)
Physical Therapy Session Note  Patient Details  Name: Debra Schmidt MRN: [id number] Date of Birth: 01/25/1961  Today's Date: 05/15/2020 PT Individual Time: 2091-9802 PT Individual Time Calculation (min): 15 min   Short Term Goals: Week 1:  PT Short Term Goal 1 (Week 1): Pt will perform bed mobility with min A PT Short Term Goal 2 (Week 1): Pt will perform transfers with min A PT Short Term Goal 3 (Week 1): Pt will ambulate 50 feet with RW and min A PT Short Term Goal 4 (Week 1): Pt will initiate stair training  Skilled Therapeutic Interventions/Progress Updates:    Patient received in bed, confused and reporting significant amounts of pain but willing to attempt activity. Able to roll with MinA, then when PT attempted to help her to sitting from sidelying position she continued to yell and cry out- unable to tolerate getting to EOB due to pain. Attempted bed level exercises however she then began to fall asleep during exercises. Very confused- states "I just saw a cat walk into the room and go into the bathroom but I was also thinking about my animals". RN assisted in repositioning her in the bed. Left in bed with all needs met, bed alarm active. 15 minutes of skilled therapy time lost due to combination of pain and lethargy.   Therapy Documentation Precautions:  Precautions Precautions: Fall, Back Precaution Booklet Issued: No Precaution Comments:  (Reviewed back precautions) Required Braces or Orthoses: Spinal Brace Spinal Brace: Lumbar corset, Applied in sitting position Restrictions Weight Bearing Restrictions: No RUE Weight Bearing: Weight bearing as tolerated Other Position/Activity Restrictions: no overhead movement RUE Pain: Pain Assessment Pain Scale: Faces Faces Pain Scale: Hurts whole lot Pain Type: Surgical pain Pain Location: Back Pain Orientation: Lower Pain Descriptors / Indicators: Aching;Sore;Stabbing Pain Onset: On-going Patients Stated Pain Goal: 0 Pain  Intervention(s): Repositioned;RN made aware    Therapy/Group: Individual Therapy   Windell Norfolk, DPT, PN1   Supplemental Physical Therapist [place]    Pager [phone] Acute Rehab Office [phone]    05/15/2020, 3:43 PM

## 2020-05-15 NOTE — Progress Notes (Signed)
[place] PHYSICAL MEDICINE & REHABILITATION PROGRESS NOTE   Subjective/Complaints:  Left sided sciatica most painful to her- limiting sessions. Cognition appears improved. Changed to 15/7 due to decreased tolerance for therapy due to back pain  ROS: Pt denies SOB, abd pain, CP, N/V/C/D, and vision changes  Objective:   No results found. Recent Labs    05/15/20 0328  WBC 5.3  HGB 8.5*  HCT 27.8*  PLT 417*   Recent Labs    05/15/20 0328  NA 136  K 4.2  CL 100  CO2 26  GLUCOSE 114*  BUN 12  CREATININE 0.53  CALCIUM 9.8    Intake/Output Summary (Last 24 hours) at 05/15/2020 1055 Last data filed at 05/15/2020 0900 Gross per 24 hour  Intake 375 ml  Output 1650 ml  Net -1275 ml     Physical Exam: Vital Signs Blood pressure 107/72, pulse 99, temperature 98.4 F (36.9 C), resp. rate 16, height 5' 4"  (1.626 m), weight 54.4 kg, SpO2 100 %.  General: Alert and oriented x 3, No apparent distress HEENT: Head is normocephalic, atraumatic, PERRLA, EOMI, sclera anicteric, oral mucosa pink and moist, poor dentition  Neck: Muscles in upper traps and rhomboids tight still B/L Heart: Reg rate and rhythm. No murmurs rubs or gallops Chest: CTA bilaterally without wheezes, rales, or rhonchi; no distress Abdomen: Soft, non-tender, non-distended, bowel sounds positive. Extremities: No clubbing, cyanosis, or edema. Pulses are 2+ Skin: incision looks great- no drainage, no erythema- a little puffy, but good Neuro: Deltoids, biceps 4/5 B/L; Triceps, WE, grip and finger abd 4-/5 B/L LEs- HF 2+/5 on R; 2/5 on L; KE 2+/5, DF 2/5, and PF 3/5 B/L5/5.  Psych: Pt's affect is anxious secondary to pain   Assessment/Plan: 1. 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  Physiatrist is providing close team supervision and  24 hour management of active medical problems listed below.  Physiatrist and rehab team continue to assess barriers to discharge/monitor patient progress toward functional and medical goals  Care Tool:  Bathing    Body parts bathed by patient: Chest, Abdomen, Front perineal area, Right upper leg, Left upper leg, Face   Body parts bathed by helper: Buttocks, Right arm, Left arm, Right lower leg, Left lower leg     Bathing assist Assist Level: Moderate Assistance - Patient 50 - 74%     Upper Body Dressing/Undressing Upper body dressing   What is the patient wearing?: Hospital gown only    Upper body assist Assist Level: Maximal Assistance - Patient 25 - 49%    Lower Body Dressing/Undressing Lower body dressing      What is the patient wearing?: Incontinence brief     Lower body assist Assist for lower body dressing: Maximal Assistance - Patient 25 - 49%     Toileting Toileting    Toileting assist Assist for toileting: Maximal Assistance - Patient 25 - 49%     Transfers Chair/bed transfer  Transfers assist     Chair/bed transfer assist level: Minimal Assistance - Patient > 75%     Locomotion Ambulation   Ambulation assist      Assist level: 2 helpers Assistive device: Walker-rolling Max distance: 14f   Walk 10 feet activity   Assist     Assist level: 2 helpers Assistive device: Walker-rolling   Walk 50 feet activity   Assist Walk  50 feet with 2 turns activity did not occur: Safety/medical concerns         Walk 150 feet activity   Assist Walk 150 feet activity did not occur: Safety/medical concerns         Walk 10 feet on uneven surface  activity   Assist Walk 10 feet on uneven surfaces activity did not occur: Safety/medical concerns         Wheelchair     Assist Will patient use wheelchair at discharge?:  (TBD)             Wheelchair 50 feet with 2 turns activity    Assist            Wheelchair 150  feet activity     Assist          Blood pressure 107/72, pulse 99, temperature 98.4 F (36.9 C), resp. rate 16, height 5' 4"  (1.626 m), weight 54.4 kg, SpO2 100 %.  Medical Problem List and Plan: 1.  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  Antithrombotics: -DVT/anticoagulation:  Mechanical: Sequential compression devices, below knee Bilateral lower extremities   - Dopplers negative.              -antiplatelet therapy: N/A 3. Pain Management: OxyContin 20 mg bid with oxycodone prn. Continues to have a lot of pain due to back spasms--robaxin ineffective. Will change to flexeril 50m tid scheduled. Will decrease valium to 2 mg bid prn. - appeared that RN's not giving prns very often, so, will see what she needs  6/30- increase Valium to 5 mg BID and make scheduled; con't oxycontin but move to 6am and 6pm- and con't flexeril and oxy prn.q3 H prn  7/1: added Robaxin which she is asking for this morning.  7/2: Improved but still limiting therapy.. Schedule Tylenol.  7/3 sedated and confused, will back down on Valium to 233m- appears brighter and more alert today   7/5: appears more alert. Start Gabapentin 30063mID for left sided sciatic pain that is currently most severe to her.  Flector patch to paraspinal area  4. Mood: LCSW to follow for evaluation and support.              -antipsychotic agents:  N/A 5. Neuropsych: This patient is capable of making decisions on her own behalf. 6. Skin/Wound Care: Monitor incisions for healing. Continue Juven to promote wound healing.  7. Fluids/Electrolytes/Nutrition: Monitor I/O. Check lytes in am.  8. Disseminated MSSA infection with osteomyelitis, epidural abscess and R septic shoulder: Cefepime X 6 weeks with weekly CBC/BMP/ESR/CRP-end  date 06/03/20.  9. HTN: Monitor BP tid--on Norvasc and Lisinopril daily.   6/30- BP well controlled;  will monitor for orthostatic hypotension  7/1: Hypotensive. Stop Amlodipine.   7/2: Hypotensive: Stop Lisinopril.  Vitals:   05/14/20 1942 05/15/20 0443  BP: 108/75 107/72  Pulse: 98 99  Resp: 17 16  Temp: 98.4 F (36.9 C) 98.4 F (36.9 C)  SpO2: 93% 100%  Soft BPs  May improve with reduction in valium  7/5: Bps continue to be soft. Stop Flomax.  10. Epidural abscess with diskitis: On IV antibiotics end date 06/03/20  11. Chronic pancreatitis: Abdominal symptoms have resolved. To follow with [facility] for  Lap chole on outpatient basis.  12. Hypokalemia: Hypomagnesemia has resolved with supplement. Will continue to keep Kdur at 40 meq bid as tends to drop when decreased once a day. Will monitor lytes MWF for now.   6/30- K+ much better 3.8- will check frequently 13. Acute on chronic anemia: H/H stable. Recheck in am.  14. Neurogenic bladder/Urinary retention: Remove foley in am and start bladder training.  6/30- remove foley today  Requires I/O cath on flomax add low dose urecholine 69m TID - pt thinks "bladder is ok" 15. Neurogenic bowel- will see if she needs bowel program- is likely since BM just "fell out" this AM when transferring- no control of it.   6/30- see if needs Bowel program  7/1: moving bowels regularly.  7/4 cont of bowel   7/5: Had BM this morning.     LOS: 6 days A FACE TO FACE EVALUATION WAS PERFORMED  KClide DeutscherRaulkar 05/15/2020, 10:55 AM

## 2020-05-15 NOTE — Progress Notes (Signed)
Inpatient Rehabilitation Care Coordinator Assessment and Plan  Patient Details  Name: Debra Schmidt MRN: [id number] Date of Birth: [birth year]  Today's Date: 05/15/2020  Problem List:  Patient Active Problem List   Diagnosis Date Noted  . Incomplete paraplegia ([place]) 05/09/2020  . Neurogenic bladder 05/09/2020  . Neurogenic bowel 05/09/2020  . MSSA bacteremia 04/25/2020  . Septic arthritis of right acromioclavicular joint ([place]) 04/24/2020  . 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 Cigarette smoker 04/24/2020  . Status post surgery 04/23/2020  . Abscess in epidural space of lumbar spine   . Hypokalemia   . Hypomagnesemia    Past Medical History: History reviewed. No pertinent past medical history. Past Surgical History:  Past Surgical History:  Procedure Laterality Date  . ACROMIO-CLAVICULAR JOINT REPAIR Right 04/25/2020   Procedure: ACROMIO-CLAVICULAR JOINT REPAIR;  Surgeon: Renette Butters, MD;  Location: [facility];  Service: Orthopedics;  Laterality: Right;  . I & D EXTREMITY Right 04/25/2020   Procedure: Open debridement R AC joint; Distal clavicle repair;  Surgeon: Renette Butters, MD;  Location: [facility];  Service: Orthopedics;  Laterality: Right;  . LUMBAR LAMINECTOMY/DECOMPRESSION MICRODISCECTOMY N/A 04/23/2020   Procedure: Lumbar One-Two LAMINECTOMY for Epidural Abscess Evacuation;  Surgeon: Ashok Pall, MD;  Location: [facility];  Service: Neurosurgery;  Laterality: N/A;  . RESECTION DISTAL CLAVICAL Right 04/25/2020   Procedure: RESECTION DISTAL CLAVICAL;  Surgeon: Renette Butters, MD;  Location: [facility];  Service: Orthopedics;  Laterality: Right;   Social History:  reports that she has been smoking cigarettes. She has been smoking about 0.50 packs per day. She has never used smokeless tobacco. She reports previous alcohol use. She reports previous drug use.  Family / Support Systems Marital Status: Married How Long?: 16 years Patient Roles: Spouse Spouse/Significant  Other: Johnny- married but verbally seperated. 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 Pt admitted to being tired, and trouble remembering husband's telephone number. Recent Psychosocial Issues: Denies Psychiatric History: Denies Substance Abuse History: Denies. Pt admits she quit smoking cigarettes 6 months ago. No EtoH use. Admits to past of SA. Per EMR, pt has distant hx of IVDU.  Patient / Family Perceptions, Expectations & Goals Pt/Family understanding of illness & functional limitations: Pt has general understanding of care needs Premorbid pt/family roles/activities: Independent Anticipated changes in roles/activities/participation: Assistance with ADLs/IADLs Pt/family expectations/goals: "getting back my walking."  [employer]: None Premorbid Home Care/DME Agencies: None Transportation available at discharge: husband Resource referrals recommended: Neuropsychology  Discharge Planning Living Arrangements: Spouse/significant other, Children Support Systems: Spouse/significant other, Children Type of Residence:  Private residence Financial Resources: Family Support Financial Screen Referred: Yes Living Expenses: Own Money Management: Patient Does the patient have any problems obtaining your medications?: No Care Coordinator Barriers to Discharge: Decreased caregiver support, Incontinence, Lack of/limited family support, IV antibiotics  Care Coordinator Anticipated Follow Up Needs: HH/OP Expected length of stay: 14-17 days  Clinical Impression SW met with pt in room at beside to introduce self, explain role, and discuss discharge process. Pt has no PCP, but amenable to SW getting new PCP. Pt not a veteran. HCPOA- Charlotte Crumb (husband); no documentation on file. DME: [employer] [facility] (charity) provided to pt.   Jeneal Vogl A Aidynn Polendo 05/15/2020, 1:46 PM

## 2020-05-15 NOTE — Progress Notes (Signed)
Occupational Therapy Session Note  Patient Details  Name: Debra Schmidt MRN: [id number] Date of Birth: 02/18/1961  Today's Date: 05/15/2020 OT Individual Time: 1345-1415 OT Individual Time Calculation (min): 30 min  and Today's Date: 05/15/2020 OT Missed Time: 15 Minutes Missed Time Reason: Pain   Short Term Goals: Week 1:  OT Short Term Goal 1 (Week 1): Pt will perform toileting with min A overall. OT Short Term Goal 2 (Week 1): Pt will perform LB dressing with use of AE or modifcations with min A. OT Short Term Goal 3 (Week 1): Pt perform toilet transfer with min A. OT Short Term Goal 4 (Week 1): Pt will stand for 2 minutes with min A or less for balance during self care tasks.  Skilled Therapeutic Interventions/Progress Updates:    Pt asleep in bed upon arrival but easily aroused.  Pt agreeable to trying to sit EOB. On fist attempt pt stated her "butt cheeks" started hurting "real bad." Pt requested to rest until pain eased off.  Pt in/out sleep during the rest.  Therapist attempted to keep pt aroused with conversation. Pt able to roll into sideyling on R side on second attempt but unable to move BLE off EOB in preparation for sitting EOB. Pt stated she was in "too much" pain to try sitting EOB. Discussed importance of participating in therapy to gain strength and perform ADLs without assistance. Pt verbalized understanding but continue to decline sitting EOB. Pt remained in bed with all needs within reach and bed alarm activated.   Therapy Documentation Precautions:  Precautions Precautions: Fall, Back Precaution Booklet Issued: No Precaution Comments:  (Reviewed back precautions) Required Braces or Orthoses: Spinal Brace Spinal Brace: Lumbar corset, Applied in sitting position Restrictions Weight Bearing Restrictions: No RUE Weight Bearing: Weight bearing as tolerated Other Position/Activity Restrictions: no overhead movement RUE General: General OT Amount of Missed Time: 15  Minutes Pain:  8/10 back pain and glutes with activity; repositioned.  Pt states her pain had "settled down" prior to activity   Therapy/Group: Individual Therapy  Rich Brave 05/15/2020, 2:21 PM

## 2020-05-15 NOTE — Progress Notes (Signed)
Patient ID: SHAWNTELLE UNGAR, female   DOB: 08/15/1961, 59 y.o.   MRN: [id number]  Sw spoke with pt husband Bethann Berkshire [phone]) to introduce self, explain role, and discuss d/c process. He reports that he works and is only home in evening 9pm-6am. States his son will be home during the day. He had many concerns related to pt care such as medical questions, and how to pay for IV abx. SW informed there will be follow-up after team conference tomorrow to provide more updates on plan of care.  SW to update [facility] [phone]) when there is a d/c date for patient.   Cecile Sheerer, MSW, LCSWA Office: [phone] Cell: [phone] Fax: [phone]

## 2020-05-15 NOTE — Plan of Care (Signed)
  Problem: Consults °Goal: RH SPINAL CORD INJURY PATIENT EDUCATION °Description:  See Patient Education module for education specifics.  °Outcome: Progressing °Goal: Skin Care Protocol Initiated - if Braden Score 18 or less °Description: If consults are not indicated, leave blank or document N/A °Outcome: Progressing °Goal: Nutrition Consult-if indicated °Outcome: Progressing °  °Problem: SCI BOWEL ELIMINATION °Goal: RH STG MANAGE BOWEL WITH ASSISTANCE °Description: STG Manage Bowel with max/total Assistance. °Outcome: Progressing °Goal: RH STG SCI MANAGE BOWEL WITH MEDICATION WITH ASSISTANCE °Description: STG SCI Manage bowel with medication with max/total assistance. °Outcome: Progressing °Goal: RH STG SCI MANAGE BOWEL PROGRAM W/ASSIST OR AS APPROPRIATE °Description: STG SCI Manage bowel program w/max/total assist or as appropriate. °Outcome: Progressing °  °Problem: SCI BLADDER ELIMINATION °Goal: RH STG MANAGE BLADDER WITH ASSISTANCE °Description: STG Manage Bladder With min Assistance °Outcome: Progressing °Goal: RH STG MANAGE BLADDER WITH MEDICATION WITH ASSISTANCE °Description: STG Manage Bladder With Medication With min Assistance. °Outcome: Progressing °  °Problem: RH SKIN INTEGRITY °Goal: RH STG SKIN FREE OF INFECTION/BREAKDOWN °Description: Skin to remain free from infection/breakdown while on rehab with min assist from staff. °Outcome: Progressing °Goal: RH STG MAINTAIN SKIN INTEGRITY WITH ASSISTANCE °Description: STG Maintain Skin Integrity With min Assistance. °Outcome: Progressing °Goal: RH STG ABLE TO PERFORM INCISION/WOUND CARE W/ASSISTANCE °Description: STG Able To Perform Incision/Wound Care With mod Assistance. °Outcome: Progressing °  °Problem: RH SAFETY °Goal: RH STG ADHERE TO SAFETY PRECAUTIONS W/ASSISTANCE/DEVICE °Description: STG Adhere to Safety Precautions With min Assistance and appropriate assistive Device. °Outcome: Progressing °  °Problem: RH PAIN MANAGEMENT °Goal: RH STG PAIN MANAGED  AT OR BELOW PT'S PAIN GOAL °Description: <4 on a 0-10 pain scale. °Outcome: Progressing °  °Problem: RH KNOWLEDGE DEFICIT SCI °Goal: RH STG INCREASE KNOWLEDGE OF SELF CARE AFTER SCI °Description: Patient will demonstrate knowledge of medication management, bladder program, bowel program, safety precautions, and follow up care with the MD post discharge with min assist from CIR staff. °Outcome: Progressing °  °

## 2020-05-15 NOTE — Progress Notes (Signed)
Physical Therapy Session Note  Patient Details  Name: Debra Schmidt MRN: [id number] Date of Birth: 06/12/1961  Today's Date: 05/15/2020 PT Individual Time: 0800-0900 PT Individual Time Calculation (min): 60 min   Short Term Goals: Week 1:  PT Short Term Goal 1 (Week 1): Pt will perform bed mobility with min A PT Short Term Goal 2 (Week 1): Pt will perform transfers with min A PT Short Term Goal 3 (Week 1): Pt will ambulate 50 feet with RW and min A PT Short Term Goal 4 (Week 1): Pt will initiate stair training  Skilled Therapeutic Interventions/Progress Updates:  Pt received semi-reclined in bed finishing breakfast. Agreeable to therapy. States low back pain is "a little better" at rest in bed prior to starting therapy session. Addressed patient's pain with positioning, frequent rest breaks, and distraction throughout session. Pt performs bed mobility with min A throughout session, with verbal cues required for hand placement on bed rail. Lumbar corset donned with pt seated at EOB by therapist. Pt requests to use the bathroom. Stand pivot transfer from EOB to [facility] with RW and min A. Pt had a continent BM, and was dependent for clothing management and pericare. Stand pivot transfer from [facility] to EOB with RW and min A. Buckling of RLE noted when standing. Verbal cues required for upright posture. Stand pivot transfer EOB to W/C with RW and min A. Dependent W/C transfer to/from therapy gym for time conservation. Attempted to use upper body ergometer, but pt was unable to successfully reach handles d/t positioning of W/C. Seated in W/C, performed strengthening of B/L upper and lower extremities: bicep curls with 1 pound dowel rod 2 x 10 each side, LAQs 2 x 10 each side with therapist providing support under the thigh to prevent twisting of low back. Pt states she has "minimal" pain when performing these exercises. On return to room, stand pivot transfer W/C to EOB with RW and min A. Buckling of RLE noted  when standing. Lumbar corset doffed with pt seated at EOB by therapist. Sit to supine with min A. Pt left semi-reclined in bed with all needs in reach. Pt does not appear confused today, and was able to participate throughout entire session despite pain.  Therapy Documentation Precautions:  Precautions Precautions: Fall, Back Precaution Booklet Issued: No Precaution Comments:  (Reviewed back precautions) Required Braces or Orthoses: Spinal Brace Spinal Brace: Lumbar corset, Applied in sitting position Restrictions Weight Bearing Restrictions: No RUE Weight Bearing: Weight bearing as tolerated Other Position/Activity Restrictions: no overhead movement RUE  Therapy/Group: Individual Therapy  Murrell Redden 05/15/2020, 12:35 PM

## 2020-05-15 NOTE — Progress Notes (Signed)
Physical Therapy Note  Patient Details  Name: Debra Schmidt MRN: [id number] Date of Birth: [birth year] Today's Date: 05/15/2020    After discussion with therapy and medical team patient's schedule changed to 15/7 due to decreased tolerance for therapy sessions due to ongoing back pain.    Peter Congo, PT, DPT  05/15/2020, 7:48 AM

## 2020-05-16 ENCOUNTER — Inpatient Hospital Stay (HOSPITAL_COMMUNITY): Payer: Self-pay | Admitting: Occupational Therapy

## 2020-05-16 ENCOUNTER — Inpatient Hospital Stay (HOSPITAL_COMMUNITY): Payer: Self-pay

## 2020-05-16 ENCOUNTER — Inpatient Hospital Stay (HOSPITAL_COMMUNITY): Payer: Self-pay | Admitting: Physical Therapy

## 2020-05-16 DIAGNOSIS — M4646 Discitis, unspecified, lumbar region: Secondary | ICD-10-CM

## 2020-05-16 DIAGNOSIS — R930 Abnormal findings on diagnostic imaging of skull and head, not elsewhere classified: Secondary | ICD-10-CM

## 2020-05-16 DIAGNOSIS — B9561 Methicillin susceptible Staphylococcus aureus infection as the cause of diseases classified elsewhere: Secondary | ICD-10-CM

## 2020-05-16 DIAGNOSIS — M4626 Osteomyelitis of vertebra, lumbar region: Secondary | ICD-10-CM

## 2020-05-16 LAB — URINALYSIS, ROUTINE W REFLEX MICROSCOPIC
Bilirubin Urine: NEGATIVE
Glucose, UA: NEGATIVE mg/dL
Hgb urine dipstick: NEGATIVE
Ketones, ur: NEGATIVE mg/dL
Leukocytes,Ua: NEGATIVE
Nitrite: NEGATIVE
Protein, ur: NEGATIVE mg/dL
Specific Gravity, Urine: 1.015 (ref 1.005–1.030)
pH: 6 (ref 5.0–8.0)

## 2020-05-16 IMAGING — MR MR HEAD WO/W CM
12 of 14 series · 38 of 48 positions shown · IV contrast (gadavist)
Comparison: Prior CT from [DATE].

CLINICAL DATA: Initial evaluation for acute encephalopathy,
abnormal head CT.

EXAM:
MRI HEAD WITHOUT AND WITH CONTRAST
TECHNIQUE: Multiplanar, multiecho pulse sequences of the brain and surrounding
structures were obtained without and with intravenous contrast.
CONTRAST:  5.5mL GADAVIST GADOBUTROL 1 MMOL/ML IV SOLN

[Series 5: DWI · axial · 3.0mm · 0.88mm/px · z∈[-65,+77]mm · 7 of 100 slices shown (1 of 4)]
[im 1/100]
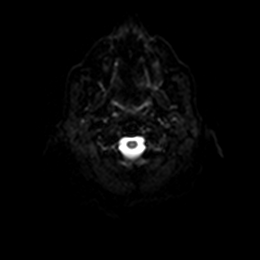
[im 17/100]
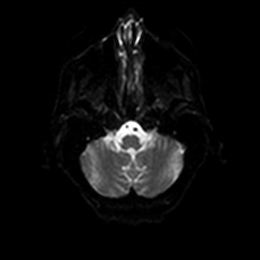
[im 34/100]
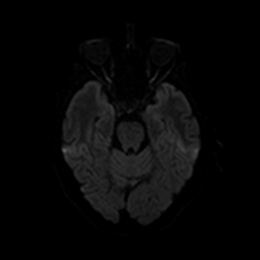
[im 50/100]
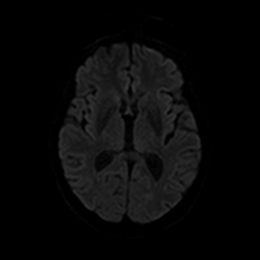
[im 67/100]
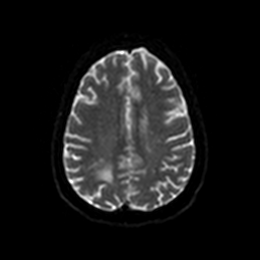
[im 83/100]
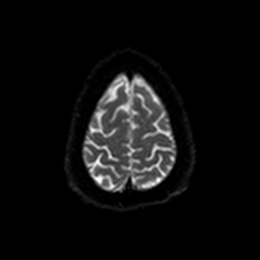
[im 100/100]
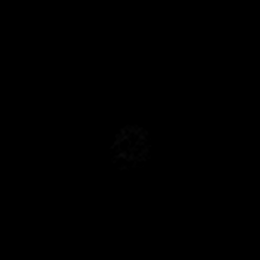

[Series 6: DWI · axial · 3.0mm · 0.88mm/px · z∈[-65,+77]mm · 3 of 50 slices shown (2 of 4)]
[im 1/50]
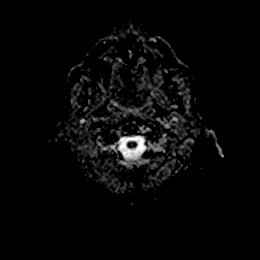
[im 25/50]
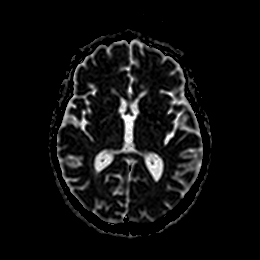
[im 50/50]
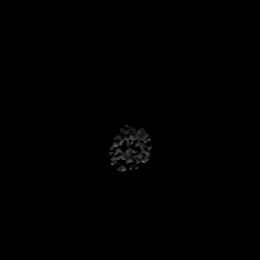

[Series 7: DWI · coronal · 4.0mm · 0.88mm/px · 5 of 64 slices shown (3 of 4)]
[im 1/64]
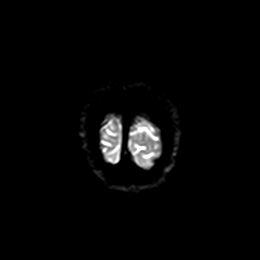
[im 16/64]
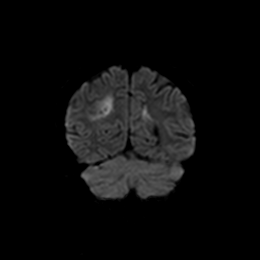
[im 32/64]
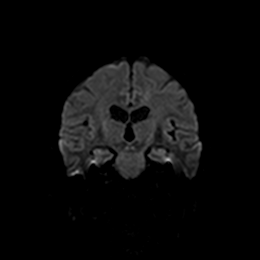
[im 48/64]
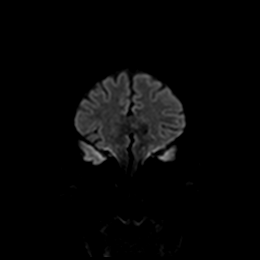
[im 64/64]
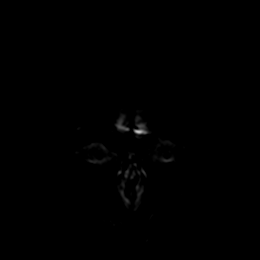

[Series 8: DWI · coronal · 4.0mm · 0.88mm/px · 2 of 32 slices shown (4 of 4)]
[im 1/32]
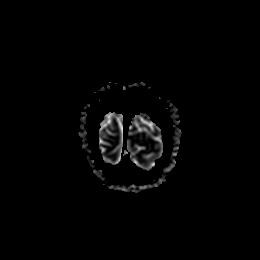
[im 32/32]
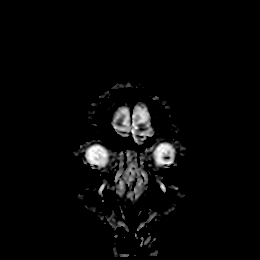

[Series 9: T1 · sagittal · 5.0mm · 0.75mm/px · 2 of 23 slices shown]
[im 1/23]
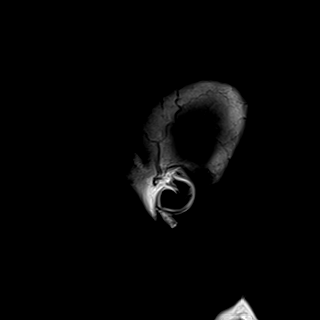
[im 23/23]
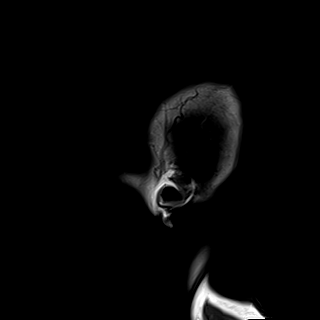

[Series 10: T2 · axial · 5.0mm · 0.72mm/px · z∈[-64,+76]mm · 2 of 25 slices shown]
[im 1/25]
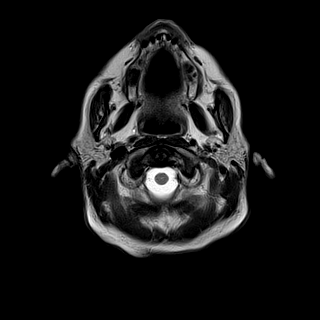
[im 25/25]
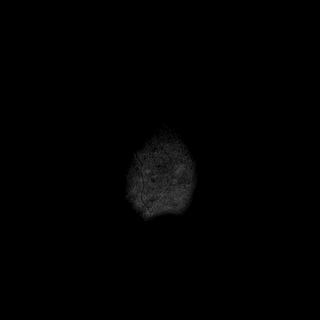

[Series 11: FLAIR · axial · 5.0mm · 0.45mm/px · z∈[-64,+76]mm · 2 of 25 slices shown]
[im 1/25]
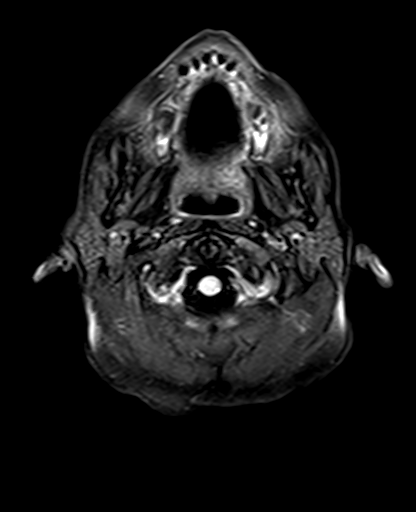
[im 25/25]
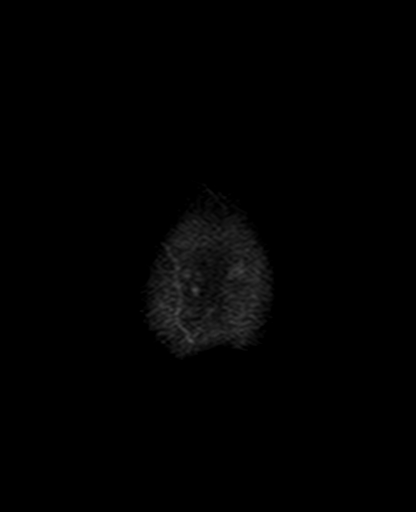

[Series 13: pha_images · axial · 3.0mm · 0.90mm/px · z∈[-80,+86]mm · 4 of 58 slices shown]
[im 1/58]
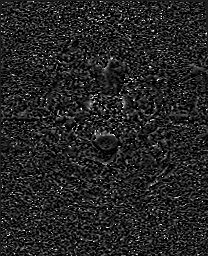
[im 20/58]
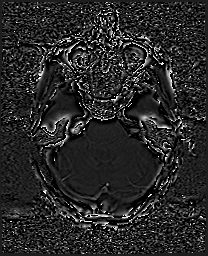
[im 39/58]
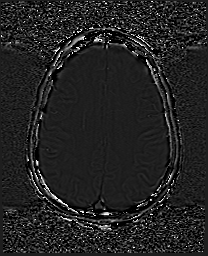
[im 58/58]
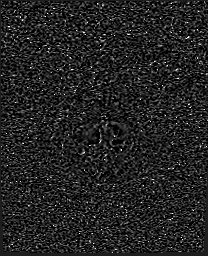

[Series 14: swi_images · axial · 3.0mm · 0.90mm/px · z∈[-80,+92]mm · 5 of 60 slices shown]
[im 1/60]
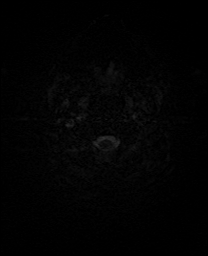
[im 15/60]
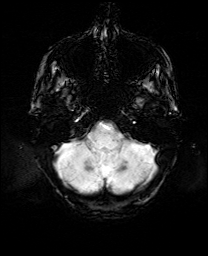
[im 30/60]
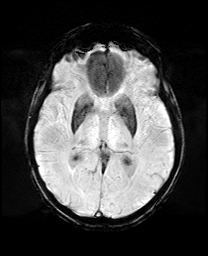
[im 45/60]
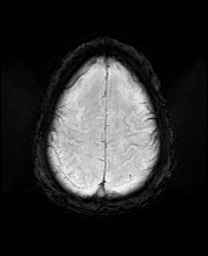
[im 60/60]
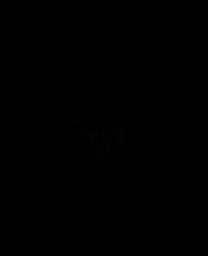

[Series 17: T2 post-contrast · coronal · 5.0mm · 0.72mm/px · 2 of 28 slices shown]
[im 1/28]
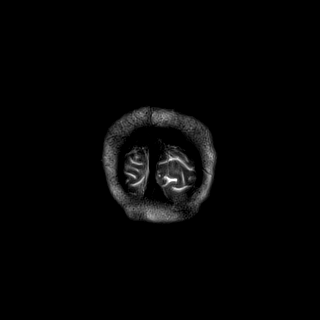
[im 28/28]
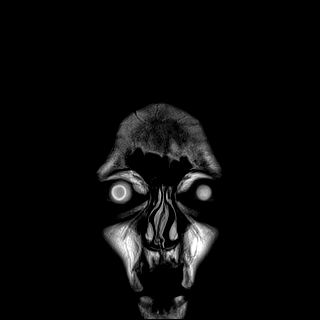

[Series 19: T1 post-contrast · coronal · 5.0mm · 0.34mm/px · 2 of 28 slices shown (1 of 2)]
[im 1/28]
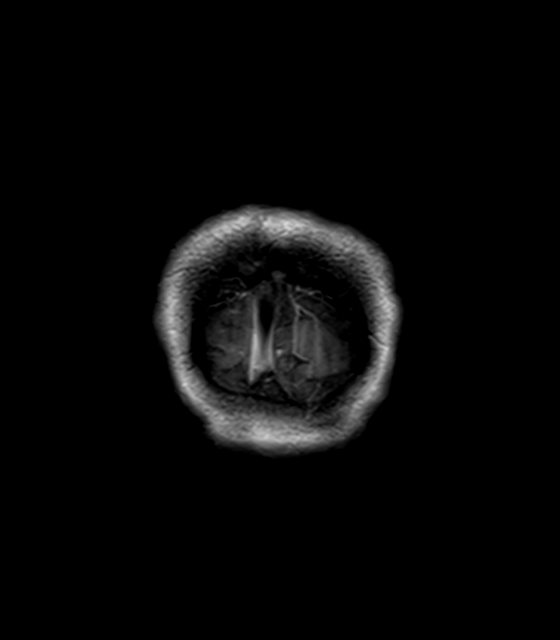
[im 28/28]
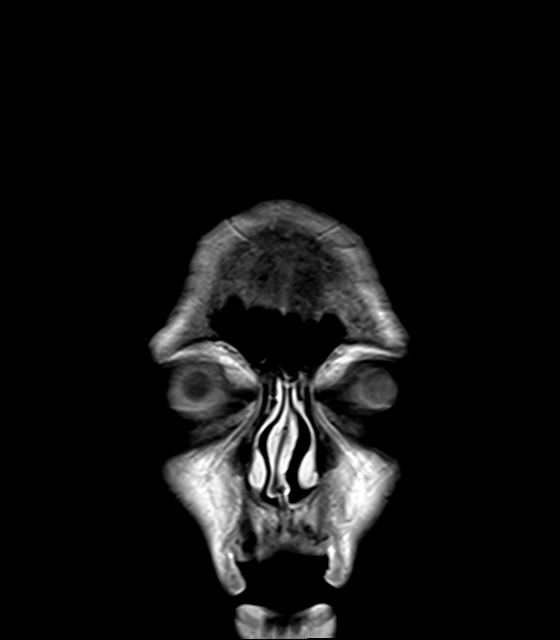

[Series 20: T1 post-contrast · sagittal · 5.0mm · 0.72mm/px · 2 of 23 slices shown (2 of 2)]
[im 1/23]
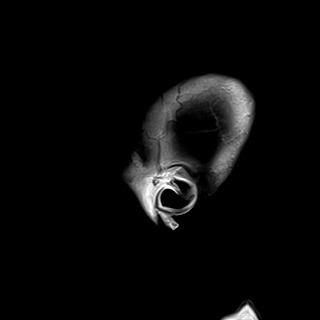
[im 23/23]
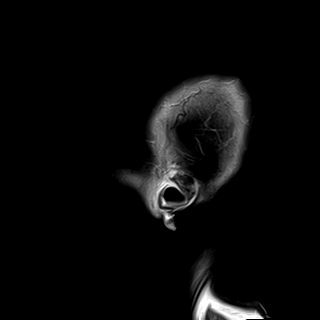

[38 of 48 positions shown; findings below may reference images not displayed]

FINDINGS: Brain: Cerebral volume within normal limits. Few scattered foci of
T2/FLAIR hyperintensity involving the periventricular and deep white
matter both cerebral hemispheres noted common nonspecific, but most
like related to minimal chronic microvascular ischemic disease.
Superimposed small remote lacunar infarct present at the left
anterior genu of the corpus callosum (series 10, image 14).

3.1 cm focus of T2/FLAIR signal abnormality seen involving the
subcortical right parietal lobe, corresponding with abnormality seen
on prior CT (series 10, image 16). Additional similar somewhat
linear signal change measuring 2.2 cm seen at the subcortical
parasagittal left frontal lobe (series 11, image 18). Additional
smaller focus seen at the subcortical left parietal lobe (series 11,
images 16, 14). Lesions demonstrate somewhat poorly defined and
angular margins and places. Suggestion of underlying cystic bubbly
change at the right parietal lesion (series 10, image 16).
Associated diffusion signal abnormality seen about these lesions. No
involvement of the overlying cortical gray matter. No associated
susceptibility artifact to suggest hemorrhage. No significant mass
effect or discernible enhancement seen following contrast
administration. Findings are indeterminate.

Remainder the brain is essentially normal in appearance. No evidence
for acute or subacute infarct elsewhere within the brain no
encephalomalacia to suggest chronic cortical infarction. No evidence
for acute or chronic intracranial hemorrhage.

No other mass lesion, mass effect, or midline shift. No
hydrocephalus or extra-axial fluid collection. Pituitary gland
suprasellar region within normal limits. Midline structures intact.
No other abnormal enhancement.

Vascular: Major intracranial vascular flow voids are well
maintained.

Skull and upper cervical spine: Craniocervical junction within
normal limits. Bone marrow signal intensity normal. No scalp soft
tissue abnormality.

Sinuses/Orbits: Globes and orbital soft tissues within normal
limits. Paranasal sinuses are clear. Trace right mastoid effusion
noted, of doubtful significance. Inner ear structures grossly
normal.

Other: None.
IMPRESSION: 1. Multifocal nonenhancing T2/FLAIR hyperintense lesions involving
the subcortical right greater than left parietal lobes as well as
the subcortical parasagittal left frontal lobe as above. Lesions are
fairly unusual in appearance, and of uncertain etiology.
Differential considerations include changes related to PML,
multifocal encephalitis/cerebritis, or possibly multi centric
glioma. A possible demyelinating process is considered, although
felt to be less likely given appearance. Correlation with CSF
analysis may be helpful for further characterization.
2. Remote lacunar infarct at the anterior genu of the left corpus
callosum.
3. No other acute intracranial abnormality identified.

## 2020-05-16 MED ORDER — OXYCODONE HCL ER 10 MG PO T12A
20.0000 mg | EXTENDED_RELEASE_TABLET | Freq: Two times a day (BID) | ORAL | Status: DC
  Administered 2020-05-16 – 2020-05-17 (×2): 20 mg via ORAL
  Filled 2020-05-16 (×2): qty 2

## 2020-05-16 MED ORDER — DIAZEPAM 2 MG PO TABS
1.0000 mg | ORAL_TABLET | Freq: Two times a day (BID) | ORAL | Status: DC
  Administered 2020-05-16 – 2020-05-17 (×2): 1 mg via ORAL
  Filled 2020-05-16 (×2): qty 1

## 2020-05-16 MED ORDER — GADOBUTROL 1 MMOL/ML IV SOLN
5.5000 mL | Freq: Once | INTRAVENOUS | Status: AC | PRN
  Administered 2020-05-16: 5.5 mL via INTRAVENOUS

## 2020-05-16 NOTE — Progress Notes (Signed)
Physical Therapy Session Note  Patient Details  Name: Debra Schmidt MRN: [id number] Date of Birth: 1961/10/24  Today's Date: 05/16/2020 PT Individual Time: 1000-1100 PT Individual Time Calculation (min): 60 min   Short Term Goals: Week 1:  PT Short Term Goal 1 (Week 1): Pt will perform bed mobility with min A PT Short Term Goal 2 (Week 1): Pt will perform transfers with min A PT Short Term Goal 3 (Week 1): Pt will ambulate 50 feet with RW and min A PT Short Term Goal 4 (Week 1): Pt will initiate stair training  Skilled Therapeutic Interventions/Progress Updates:  Pt received supine in bed in room. Agreeable to therapy. States low back pain is "okay" at rest in bed prior to starting therapy session. Addressed patient's pain with positioning,frequent rest breaks, and distraction throughout session. Supine to sit in bed with mod A and verbal cues required for hand placement on bed rail. While seated at EOB, pt uses B/L hands to brace herself and demonstrates significant posterior lean despite multiple verbal cues for upright posture. Pt then immediately requests to lay down d/t significant low back pain. Sit to supine in bed with mod A d/t LE management. Performed LE strengthening exercises with pt supine in bed: quad sets 1 x 10 each side, hip abduction 1 x 10 each side with mod A provided by therapist. Pt requires frequent verbal and tactile cues to perform exercises correctly. Pt then asks to sit up in bed. With the [facility] elevated to facilitate bed mobility, performed supine to sit with mod A and verbal cues required for hand placement on bed rail. Lumbar corset donned with pt seated at EOB by therapist. While seated at EOB, pt performed scooting laterally along EOB with min A. Performed squat pivot transfer EOB <> W/C with A x 2 with mod A by one person and min A by second person for shoulder management. Verbal cues required for hand placement and correct performance of transfer. Seated in the W/C,  performed LAQs to strengthen LEs 2 x 10 each side with therapist providing support under the thigh to prevent twisting of low back. Pt then requests to return to bed. Lumbar corset doffed with pt seated at EOB by therapist. Sit to supine with mod A d/t LE management. Pt left semi-reclined in bed with all needs in reach. Pt appeared increasingly confused throughout session and exhibited difficulty following commands.  Therapy Documentation Precautions:  Precautions Precautions: Fall, Back Precaution Booklet Issued: No Precaution Comments:  (Reviewed back precautions) Required Braces or Orthoses: Spinal Brace Spinal Brace: Lumbar corset, Applied in sitting position Restrictions Weight Bearing Restrictions: No RUE Weight Bearing: Weight bearing as tolerated Other Position/Activity Restrictions: no overhead movement RUE  Therapy/Group: Individual Therapy   Murrell Redden, SPT  05/16/2020, 12:40 PM

## 2020-05-16 NOTE — Progress Notes (Signed)
[facility] PHYSICAL MEDICINE & REHABILITATION PROGRESS NOTE   Subjective/Complaints: Patient states that Gabapentin has helped with her LLE pain. Therapy discussed in rounds that pain has continued to really limit her sessions. I spoke with her husband last night and this morning. He states that her cognitive deficits were present two weeks prior to her admission here. CT ordered, results below.   ROS: Pt denies SOB, abd pain, CP, N/V/C/D, and vision changes  Objective:   CT HEAD WO CONTRAST  Addendum Date: 05/16/2020   ADDENDUM REPORT: 05/16/2020 08:42 ADDENDUM: Please note the first line of the impression should read: 3 cm focus of ill-defined hypodensity within the right frontoparietal white matter, which is suspicious for recent white matter infarct or other acute intracranial process. Contrast-enhanced brain MRI is recommended for further evaluation. Electronically Signed   By: Kellie Simmering DO   On: 05/16/2020 08:42   Addendum Date: 05/15/2020   ADDENDUM REPORT: 05/15/2020 21:46 ADDENDUM: These results were called by telephone at the time of interpretation on 05/15/2020 at 9:45 pm to provider NP Danella Sensing, who verbally acknowledged these results. Electronically Signed   By: Kellie Simmering DO   On: 05/15/2020 21:46   Result Date: 05/16/2020 CLINICAL DATA:  Provided history: Mental status change, persistent or worsening effusion. Increased drowsiness. EXAM: CT HEAD WITHOUT CONTRAST TECHNIQUE: Contiguous axial images were obtained from the base of the skull through the vertex without intravenous contrast. COMPARISON:  No pertinent prior studies available for comparison. FINDINGS: Brain: There is a 3 cm focus of hypodensity within the right frontoparietal white matter which is suspicious for recent white matter infarct or other acute intracranial process. Subtle ill-defined hypoattenuation is also present within the left parietooccipital subcortical white matter. 7 mm focus of hypodensity within the  anterior genu of the corpus callosum/paramedian subcortical anterior left frontal lobe (series 3, image 12). There is no acute intracranial hemorrhage. No extra-axial fluid collection. No evidence of intracranial mass. No midline shift. Vascular: No hyperdense vessel. Skull: Normal. Negative for fracture or focal lesion. Sinuses/Orbits: Visualized orbits show no acute finding. No significant paranasal sinus disease or mastoid effusion at the imaged levels. IMPRESSION: 3 mm focus of ill-defined hypodensity within the right frontoparietal white matter, which is suspicious for recent white matter infarct or other acute intracranial process. Contrast-enhanced brain MRI is recommended for further evaluation. Additionally, there is subtle nonspecific ill-defined hypoattenuation within the left parietooccipital subcortical white matter. A fairly well circumscribed focus of hypoattenuation is present within the anterior genu of the corpus callosum/paramedian subcortical anterior left frontal lobe. These foci would also be better characterized with MRI. Electronically Signed: By: Kellie Simmering DO On: 05/15/2020 21:43   MR BRAIN W WO CONTRAST  Result Date: 05/16/2020 CLINICAL DATA:  Initial evaluation for acute encephalopathy, abnormal head CT. EXAM: MRI HEAD WITHOUT AND WITH CONTRAST TECHNIQUE: Multiplanar, multiecho pulse sequences of the brain and surrounding structures were obtained without and with intravenous contrast. CONTRAST:  5.58m GADAVIST GADOBUTROL 1 MMOL/ML IV SOLN COMPARISON:  Prior CT from 05/15/2020. FINDINGS: Brain: Cerebral volume within normal limits. Few scattered foci of T2/FLAIR hyperintensity involving the periventricular and deep white matter both cerebral hemispheres noted common nonspecific, but most like related to minimal chronic microvascular ischemic disease. Superimposed small remote lacunar infarct present at the left anterior genu of the corpus callosum (series 10, image 14). 3.1 cm focus  of T2/FLAIR signal abnormality seen involving the subcortical right parietal lobe, corresponding with abnormality seen on prior CT (series 10, image  16). Additional similar somewhat linear signal change measuring 2.2 cm seen at the subcortical parasagittal left frontal lobe (series 11, image 18). Additional smaller focus seen at the subcortical left parietal lobe (series 11, images 16, 14). Lesions demonstrate somewhat poorly defined and angular margins and places. Suggestion of underlying cystic bubbly change at the right parietal lesion (series 10, image 16). Associated diffusion signal abnormality seen about these lesions. No involvement of the overlying cortical gray matter. No associated susceptibility artifact to suggest hemorrhage. No significant mass effect or discernible enhancement seen following contrast administration. Findings are indeterminate. Remainder the brain is essentially normal in appearance. No evidence for acute or subacute infarct elsewhere within the brain no encephalomalacia to suggest chronic cortical infarction. No evidence for acute or chronic intracranial hemorrhage. No other mass lesion, mass effect, or midline shift. No hydrocephalus or extra-axial fluid collection. Pituitary gland suprasellar region within normal limits. Midline structures intact. No other abnormal enhancement. Vascular: Major intracranial vascular flow voids are well maintained. Skull and upper cervical spine: Craniocervical junction within normal limits. Bone marrow signal intensity normal. No scalp soft tissue abnormality. Sinuses/Orbits: Globes and orbital soft tissues within normal limits. Paranasal sinuses are clear. Trace right mastoid effusion noted, of doubtful significance. Inner ear structures grossly normal. Other: None. IMPRESSION: 1. Multifocal nonenhancing T2/FLAIR hyperintense lesions involving the subcortical right greater than left parietal lobes as well as the subcortical parasagittal left frontal  lobe as above. Lesions are fairly unusual in appearance, and of uncertain etiology. Differential considerations include changes related to PML, multifocal encephalitis/cerebritis, or possibly multi centric glioma. A possible demyelinating process is considered, although felt to be less likely given appearance. Correlation with CSF analysis may be helpful for further characterization. 2. 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 3. No other acute intracranial abnormality identified. Electronically Signed   By: Jeannine Boga M.D.   On: 05/16/2020 02:24   Recent Labs    05/15/20 0328  WBC 5.3  HGB 8.5*  HCT 27.8*  PLT 417*   Recent Labs    05/15/20 0328  NA 136  K 4.2  CL 100  CO2 26  GLUCOSE 114*  BUN 12  CREATININE 0.53  CALCIUM 9.8    Intake/Output Summary (Last 24 hours) at 05/16/2020 1111 Last data filed at 05/16/2020 0740 Gross per 24 hour  Intake 288 ml  Output 460 ml  Net -172 ml     Physical Exam: Vital Signs Blood pressure 101/65, pulse 95, temperature 97.7 F (36.5 C), temperature source Oral, resp. rate 17, height 5' 4"  (1.626 m), weight 54.4 kg, SpO2 98 %. General: Alert and oriented x 3, No apparent distress HEENT: Head is normocephalic, atraumatic, PERRLA, EOMI, sclera anicteric, oral mucosa pink and moist, dentition intact, poor dentition  Neck: Muscles in upper traps and rhomboids tight still B/L Heart: Reg rate and rhythm. No murmurs rubs or gallops Chest: CTA bilaterally without wheezes, rales, or rhonchi; no distress Abdomen: Soft, non-tender, non-distended, bowel sounds positive. Extremities: No clubbing, cyanosis, or edema. Pulses are 2+ Skin: incision looks great- no drainage, no erythema- a little puffy, but good Neuro: Deltoids, biceps 4/5 B/L; Triceps, WE, grip and finger abd 4-/5 B/L LEs- HF 2+/5 on R; 2/5 on L; KE 2+/5, DF 2/5, and PF 3/5 B/L5/5.  Psych: Pt's affect is anxious secondary to  pain  Assessment/Plan: 1. 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  Physiatrist is providing close team supervision and 24 hour management of active medical problems listed below.  Physiatrist and rehab team continue to assess barriers to discharge/monitor patient progress toward functional and medical goals  Care Tool:  Bathing    Body parts bathed by patient: Chest, Abdomen, Front perineal area, Right upper leg, Left upper leg, Face   Body parts bathed by helper: Buttocks, Right arm, Left arm, Right lower leg, Left lower leg     Bathing assist Assist Level: Moderate Assistance - Patient 50 - 74%     Upper Body Dressing/Undressing Upper body dressing   What is the patient wearing?: Hospital gown only    Upper body assist Assist Level: Maximal Assistance - Patient 25 - 49%    Lower Body Dressing/Undressing Lower body dressing      What is the patient wearing?: Incontinence brief     Lower body assist Assist for lower body dressing: Maximal Assistance - Patient 25 - 49%     Toileting Toileting    Toileting assist Assist for toileting: Maximal Assistance - Patient 25 - 49%     Transfers Chair/bed transfer  Transfers assist     Chair/bed transfer assist level: Minimal Assistance - Patient > 75%     Locomotion Ambulation   Ambulation assist      Assist level: 2 helpers Assistive device: Walker-rolling Max distance: 57f   Walk 10 feet activity   Assist     Assist level: 2 helpers Assistive device: Walker-rolling   Walk 50 feet activity   Assist Walk 50 feet with 2 turns activity did not occur: Safety/medical concerns         Walk 150 feet activity   Assist Walk 150 feet activity did not occur: Safety/medical concerns         Walk 10 feet on uneven surface   activity   Assist Walk 10 feet on uneven surfaces activity did not occur: Safety/medical concerns         Wheelchair     Assist Will patient use wheelchair at discharge?:  (TBD)             Wheelchair 50 feet with 2 turns activity    Assist            Wheelchair 150 feet activity     Assist          Blood pressure 101/65, pulse 95, temperature 97.7 F (36.5 C), temperature source Oral, resp. rate 17, height 5' 4"  (1.626 m), weight 54.4 kg, SpO2 98 %.  Medical Problem List and Plan: 1.  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  Antithrombotics: -DVT/anticoagulation:  Mechanical: Sequential compression devices, below knee Bilateral lower extremities   - Dopplers negative.              -antiplatelet therapy: N/A 3. Pain Management: OxyContin 20 mg bid with oxycodone prn. Continues to have a lot of pain due to back spasms--robaxin ineffective. Will change to flexeril 541mtid scheduled. Will decrease valium to 2 mg bid prn. - appeared that RN's not giving prns very often, so, will see what she needs  6/30- increase Valium to 5 mg BID  and make scheduled; con't oxycontin but move to 6am and 6pm- and con't flexeril and oxy prn.q3 H prn  7/1: added Robaxin which she is asking for this morning.  7/2: Improved but still limiting therapy.. Schedule Tylenol.  7/3 sedated and confused, will back down on Valium to 59m - appears brighter and more alert today   7/5: appears more alert. Start Gabapentin 3018mTID for left sided sciatic pain that is currently most severe to her  7/6: states that Gabapentin was helpful. Increase long-acting Oxycontin to 2027m12H.  Flector patch to paraspinal area  4. Mood: LCSW to follow for evaluation and support.               -antipsychotic agents:  N/A 5. Neuropsych: This patient is capable of making decisions on her own behalf. 6. Skin/Wound Care: Monitor incisions for healing. Continue Juven to promote wound healing.  7. Fluids/Electrolytes/Nutrition: Monitor I/O. Check lytes in am.  8. Disseminated MSSA infection with osteomyelitis, epidural abscess and R septic shoulder: Cefepime X 6 weeks with weekly CBC/BMP/ESR/CRP-end date 06/03/20.  9. HTN: Monitor BP tid--on Norvasc and Lisinopril daily.   6/30- BP well controlled;  will monitor for orthostatic hypotension  7/1: Hypotensive. Stop Amlodipine.   7/2: Hypotensive: Stop Lisinopril.  Vitals:   05/15/20 1934 05/16/20 0516  BP: 101/65 101/65  Pulse: 87 95  Resp: 16 17  Temp: 98.2 F (36.8 C) 97.7 F (36.5 C)  SpO2: 97% 98%  Soft BPs  May improve with reduction in valium  7/5: Bps continue to be soft. Stop Flomax.   7/6: Bps continue to be soft. Decrease Valium to 1mg56mD.  10. Epidural abscess with diskitis: On IV antibiotics end date 06/03/20  11. Chronic pancreatitis: Abdominal symptoms have resolved. To follow with [facility] for Lap chole on outpatient basis.  12. Hypokalemia: Hypomagnesemia has resolved with supplement. Will continue to keep Kdur at 40 meq bid as tends to drop when decreased once a day. Will monitor lytes MWF for now.   6/30- K+ much better 3.8- will check frequently 13. Acute on chronic anemia: H/H stable. Recheck in am.  14. Neurogenic bladder/Urinary retention: Remove foley in am and start bladder training.  6/30- remove foley today  Requires I/O cath on flomax add low dose urecholine 10mg34m - pt thinks "bladder is ok" 15. Neurogenic bowel- will see if she needs bowel program- is likely since BM just "fell out" this AM when transferring- no control of it.   6/30- see if needs Bowel program  7/1: moving bowels regularly.  7/4 cont of bowel   7/5: Had BM this morning.  15. Impaired cognition: MRI ordered and shows subcortical  lesions of unclear etiology: differential includes PML, infarct, glioma- neurology and ID consulted. Dr. KirkpLeonel Ramsay not recommend LP due to discitis but questioned subacute stroke. Dr. ComerLinus Salmons not feel septic emboli is cause of changes and recommends no change in antibiotic regimen. Neurology to evaluate.   >35 minutes spend in patient care including physical examination, discussion of pain and increase in medication; discussion of cognition with patient and husband and evaluation of MRI results and discussion of these with ID and neuro, evaluation of blood pressure and decrease in Valium.   LOS: 7 days A FACE TO FACE EVALUATION WAS PERFORMED  Laura Radilla P Tremon Sainvil 05/16/2020, 11:11 AM

## 2020-05-16 NOTE — Progress Notes (Signed)
Occupational Therapy Session Note  Patient Details  Name: Debra Schmidt MRN: [id number] Date of Birth: [birth year]  Today's Date: 05/16/2020 OT Individual Time: 1300-1340 OT Individual Time Calculation (min): 40 min    Short Term Goals: Week 1:  OT Short Term Goal 1 (Week 1): Pt will perform toileting with min A overall. OT Short Term Goal 2 (Week 1): Pt will perform LB dressing with use of AE or modifcations with min A. OT Short Term Goal 3 (Week 1): Pt perform toilet transfer with min A. OT Short Term Goal 4 (Week 1): Pt will stand for 2 minutes with min A or less for balance during self care tasks.  Skilled Therapeutic Interventions/Progress Updates:    Pt resting in bed upon arrival and agreeable to changing clothing but declined washing up. Pt noted with some confusion about discharge plans.  Pt commented that she was going home tomorrow. Pt stated she would be able to get on her "exercise walking thing." Pt did recognize that moving caused her Schmidt to escalate but stated she could manage the Schmidt at home. Pt initially declined sitting EOB and donned pants supine in bed rolling R/L. Pt required min A for rolling and assistance for pulling pants over hips.  Pt agreeable to sitting EOB to doff/don hospital gown. Pt required BUE support when sitting EOB with the exception of a few seconds with each UE to thread into sleeves. Pt initiated all transitional movements with mod assistance for sidelying to sit EOB. Pt remained seated EOB for approx 5 mins before laying back into bed. Pt requires more then a reasonable amount of time to initiate and complete all transitional movements with multiple rest breaks to "allow the Schmidt to get better." Pt remained in bed with all needs within reach and bed alarm activated.   Therapy Documentation Precautions:  Precautions Precautions: Fall, Back Precaution Booklet Issued: No Precaution Comments:  (Reviewed back precautions) Required Braces or Orthoses:  Spinal Brace Spinal Brace: Lumbar corset, Applied in sitting position Restrictions Weight Bearing Restrictions: No RUE Weight Bearing: Weight bearing as tolerated Other Position/Activity Restrictions: no overhead movement RUE :   Schmidt:  Pt c/o 8/10 Schmidt in back with activity; repositioned and emotional support   Therapy/Group: Individual Therapy  Rich Brave 05/16/2020, 1:41 PM

## 2020-05-16 NOTE — Progress Notes (Signed)
Physical Therapy Session Note  Patient Details  Name: Debra Schmidt MRN: [id number] Date of Birth: Sep 13, 1961  Today's Date: 05/16/2020 PT Individual Time: 4081-4481 PT Individual Time Calculation (min): 41 min   Short Term Goals: Week 1:  PT Short Term Goal 1 (Week 1): Pt will perform bed mobility with min A PT Short Term Goal 2 (Week 1): Pt will perform transfers with min A PT Short Term Goal 3 (Week 1): Pt will ambulate 50 feet with RW and min A PT Short Term Goal 4 (Week 1): Pt will initiate stair training  Skilled Therapeutic Interventions/Progress Updates:   Therapist confirmed with Phillis Knack, RN that pt is appropriate for therapy. Pt received supine in bed with NT present and pt agreeable to therapy session. NT reports that pt required significantly increased assist today to perform transfers EOB<>W/C<>toilet. Supine>sitting L EOB, HOB flat but using bedrails, via logroll technique with max cuing for sequencing - upon initiating trunk upright pt has significant increase in pain, shouting out in pain and returns to sidelying position. Therapist cuing for breathing technique and pt becoming tearful stating "I've never been through something like this" - provided emotional support. Pt agreeable to attempt transfer to EOB again - provided mod/max assist for trunk upright and min assist for balance once in sitting with pt having posterior lean. Donned lumbar corset total assist. Sit>stand elevated EOB>RW with min assist for lifting into standing. Initiated R stand pivot transfer to w/c using RW with pt demonstrating poor R LE stepping ability (unable to step laterally towards w/c with pt only stepping forward) then she started squatting down in a crouched posture making the transfer unsafe - returned to sitting EOB max assist. Pt requesting to return to supine due to increased pain and requesting to have remainder of her medication prior to performing further OOB mobility. Pt agreeable to perform bed  level exercises: - L sidelying R LE clamshells x10 reps - manual facilitation for proper technique and pt demonstrates very minimal AROM during exercise - supine R LE hip abduction AAROM x10 reps with cuing for increased ROM and proper form Pt then stating she can move her R LE well but that it is her L LE that she has difficulty with despite pt stating she couldn't move her R LE during the transfer. Pt left supine in bed with needs in reach and bed alarm on.   Therapist updated safety plan for +2 assist with nursing staff to increase pt safety - primary PT, Ladona Ridgel, notified.  Therapy Documentation Precautions:  Precautions Precautions: Fall, Back Precaution Booklet Issued: No Precaution Comments:  (Reviewed back precautions) Required Braces or Orthoses: Spinal Brace Spinal Brace: Lumbar corset, Applied in sitting position Restrictions Weight Bearing Restrictions: No RUE Weight Bearing: Weight bearing as tolerated Other Position/Activity Restrictions: no overhead movement RUE  Pain: Pt reports she received her "oxy" earlier but that she wants the rest of of medications because "they work better together" - RN notified for medication administration. Therapist provided rest breaks, repositioning, distraction, and emotional support for pain management.    Therapy/Group: Individual Therapy  Ginny Forth, PT, DPT, CSRS  05/16/2020, 7:42 AM

## 2020-05-16 NOTE — Progress Notes (Signed)
Discussed patient with Dr. Amada Jupiter who does not recommended LP due to infection in spine but questioned subacute stroke. Also discussed patient with Dr. Steele Berg does not feel septic emboli cause of changes and no change in antibiotic regimen. TEE was not done due to prolonged antibiotic but could repeat 2D echo to rule out valvular lesion. Will await neurology evaluation.

## 2020-05-16 NOTE — Progress Notes (Signed)
Occupational Therapy Session Note  Patient Details  Name: Debra Schmidt MRN: [id number] Date of Birth: [birth year]  Today's Date: 05/16/2020 OT Individual Time: 1530-1600 OT Individual Time Calculation (min): 30 min    Short Term Goals: Week 1:  OT Short Term Goal 1 (Week 1): Pt will perform toileting with min A overall. OT Short Term Goal 2 (Week 1): Pt will perform LB dressing with use of AE or modifcations with min A. OT Short Term Goal 3 (Week 1): Pt perform toilet transfer with min A. OT Short Term Goal 4 (Week 1): Pt will stand for 2 minutes with min A or less for balance during self care tasks.  Skilled Therapeutic Interventions/Progress Updates:    Pt supine in bed, complaining of significant back pain with movement.  Throughout session, pt appeared intermittently confused.  Pt agreeable to learning about AE for increased ease with LB dressing.  Pt completed left log roll with supervision and sidelying to sit with mod assist and increased time due to significant pain reported.  Pt needing max assist to donn lumbar corset in sitting.  Pt completed squat pivot transfer with min assist.  Educated pt on sequencing while using reacher and sock aide to donn/doff socks.  Pt return demonstrated needing max assist, with pt intermittently distracted secondary to reported pain and back spasms.  Pt requesting to return to bed.  Pt completed squat pivot transfer w/c to bed with min assist and sit to sidelying with max assist.  Pt attempting to segmental roll, needing mod assist to prevent and reposition safely.  Pt positioned for comfort, call bell in reach, bed alarm on.  Therapy Documentation Precautions:  Precautions Precautions: Fall, Back Precaution Booklet Issued: No Precaution Comments:  (Reviewed back precautions) Required Braces or Orthoses: Spinal Brace Spinal Brace: Lumbar corset, Applied in sitting position Restrictions Weight Bearing Restrictions: No RUE Weight Bearing: Weight  bearing as tolerated Other Position/Activity Restrictions: no overhead movement RUE   Therapy/Group: Individual Therapy  Amie Critchley 05/16/2020, 12:59 PM

## 2020-05-16 NOTE — Progress Notes (Signed)
Patient ID: Debra Schmidt, female   DOB: [birth year], 59 y.o.   MRN: [id number]  SW met with pt in room to provide updates from team conference and d/c date 7/17.  SW called pt husband Debra Schmidt [phone]) and left message requesting return phone call.   Loralee Pacas, MSW, [place] Office: [phone] Cell: [phone] Fax: [phone]

## 2020-05-16 NOTE — Patient Care Conference (Signed)
Inpatient RehabilitationTeam Conference and Plan of Care Update Date: 05/16/2020   Time: 3:31 PM    Patient Name: Debra Schmidt      Medical Record Number: [id number]  Date of Birth: 1961-08-23 Sex: Female         Room/Bed: 1G62I/9S85I-62 Payor Info: Payor: /    Admit Date/Time:  05/09/2020  4:12 PM  Primary Diagnosis:  Incomplete paraplegia [facility])  Hospital Problems: Principal Problem:   Incomplete paraplegia [facility]) Active Problems:   Abscess in epidural space of lumbar spine   Neurogenic bladder   Neurogenic bowel    Expected Discharge Date: Expected Discharge Date: 05/27/20  Team Members Present: Physician leading conference: Dr. Sula Soda Care Coodinator Present: Cecile Sheerer, LCSWA;Tavyn Kurka Marlyne Beards, RN, BSN, CRRN Nurse Present: Jesusita Oka, LPN PT Present: Peter Congo, PT OT Present: Roney Mans, OT;Ardis Rowan, COTA SLP Present: Feliberto Gottron, SLP PPS Coordinator present : Fae Pippin, SLP     Current Status/Progress Goal Weekly Team Focus  Bowel/Bladder    in and out cath q 6   Pt will void on thier own   toilette pt and cath per order   Swallow/Nutrition/ Hydration             ADL's   bed mobility-min A; functional transfers-min A; bathing/dressing-mod A/max A with multiple rest breaks; pain limiting consistent participation  supervsion overall  pain management, activity tolerance, functional tranfsers, BADL training; safety awareness   Mobility   min A bed mobility, min A transfers with RW, gait up to 10 ft with RW with assist x 2 for safety  Supervision  pain management, therapy tolerance, LE strengthening as able   Communication             Safety/Cognition/ Behavioral Observations            Pain   pt complains of pain level 8/10 in bvack   decrease pain to below level 3  assess pain q shifft and prn    Skin   pt has wound on left shoulder from fall at home. Surgival incision on lower back  that is closed and open to air   skin will  remain free from infection and breakdown   asess skin q shift and prn, change dressing on shoulder prn      Team Discussion:  Discharge Planning/Teaching Needs:  D/c to home with her husband who will provide intermittent support 9pm-6am; 55 y.o. son home during the day. Pt is uninsured and may require IVabx at d/c.  Family education as recommended by therapy   Current Update:  Incontinent of bowel and bladder. Requires I&O cath q 6 hr. Pain is limiting with complaints of 8/10. MRI showed infarcts but no etiology. ADL's min/mod assist. +2 for squat pivot for nursing transfers. Husband available after 9 pm to assist, has son who is also available to assist.  Current Barriers to Discharge:  Decreased caregiver support, Incontinence, Wound care and Lack of/limited family support  Possible Resolutions to Barriers: Teach wound care to pt and family. Time toilet patient q 2hr. Have family come in for family education at the recommendation of therapy.  Patient on target to meet rehab goals: yes  *See Care Plan and progress notes for long and short-term goals.   Revisions to Treatment Plan:  Treatment plan changed to 15/7 d/t pain limiting progress at this time.    Medical Summary Current Status: Cognitive deficits- workup undergoing as to etiology. Neurology consulted today and thinks most likely cause  is subacute stroke. Continues to have significant pain limiting progress Weekly Focus/Goal: Follow-up with neurology and ID regarding new MRI findings of lesions. Increase Oxycontin to 20mg  BID for better pain control. Decrease Valium to 1mg  BID due to hypotension  Barriers to Discharge: Decreased family/caregiver support;Neurogenic Bowel & Bladder;Medication compliance;Wound care  Barriers to Discharge Comments: Husband is unable to assist in catheterization, husband told me he will have difficulty paying for patient's medications, large spinal incision, significant post-op pain Possible  Resolutions to Barriers: Cargiver training, q8H catheterization, daily monitoring of spinal incision, increase pain medications as tolerated to better control pain   Continued Need for Acute Rehabilitation Level of Care: The patient requires daily medical management by a physician with specialized training in physical medicine and rehabilitation for the following reasons: Direction of a multidisciplinary physical rehabilitation program to maximize functional independence : Yes Medical management of patient stability for increased activity during participation in an intensive rehabilitation regime.: Yes Analysis of laboratory values and/or radiology reports with any subsequent need for medication adjustment and/or medical intervention. : Yes   I attest that I was present, lead the team conference, and concur with the assessment and plan of the team.   05/16/2020, 3:31 PM

## 2020-05-16 NOTE — Progress Notes (Signed)
[facility] for Infectious Disease   Reason for visit: Follow up on MRI of brain findings  Interval History: this is a reconsult for this patient due to new MRI findings.  She has known disseminated MSSA infection with bacteremia, discitis/osteomyelitis s/p debridement with L1 and L2 laminectomy by Dr. Christella Noa on 6/14 for the large epidural mass at L1/2 and right septic shoulder arthritis debrided by Dr. Alain Marion on 6/17.  She has had some intermittent confusion and an MRI is concerning for CVA among other etiologies.     Physical Exam: Constitutional:  Vitals:   05/16/20 0516 05/16/20 1406  BP: 101/65 102/73  Pulse: 95 74  Resp: 17 17  Temp: 97.7 F (36.5 C) 98.3 F (36.8 C)  SpO2: 98% 92%   patient appears in NAD Eyes: anicteric HENT: no thrush Respiratory: Normal respiratory effort; CTA B Cardiovascular: RRR GI: soft, nt, nd  Review of Systems: Constitutional: negative for fevers and chills Gastrointestinal: negative for nausea and diarrhea  Lab Results  Component Value Date   WBC 5.3 05/15/2020   HGB 8.5 (L) 05/15/2020   HCT 27.8 (L) 05/15/2020   MCV 87.7 05/15/2020   PLT 417 (H) 05/15/2020    Lab Results  Component Value Date   CREATININE 0.53 05/15/2020   BUN 12 05/15/2020   NA 136 05/15/2020   K 4.2 05/15/2020   CL 100 05/15/2020   CO2 26 05/15/2020    Lab Results  Component Value Date   ALT 6 05/10/2020   AST 20 05/10/2020   ALKPHOS 80 05/10/2020     Microbiology: No results found for this or any previous visit (from the past 240 hour(s)).  Impression/Plan:  1. Discitis/osteomyelitis s/p debridement and laminectomy - she is having slow improvement though does report significant ongoing pain.  This certainly is not unexpected.  Her ESR remains elevated at 140 though CRP only 4.9 with no previous to compare.  Overall she remains on treatment and no changes.  Will need to revevaluate at the end of treatment if prolonging the IV therapy vs oral  continuation therapy is indicated then or if she can stop antibiotics.  Will need to continue with the same weekly labs.   She has follow up with Dr. Megan Salon before treatment completion.  If she remains in rehab, we can see her at that time to reassess duration.  2.  Right shoulder septic arthritis - on exam it is good, no warmth and she has good movement. No concerns.  3.  MRI brain - concerns noted.  This does not appear c/w septic emboli so no changes in her treatment regimen indicated.

## 2020-05-17 ENCOUNTER — Inpatient Hospital Stay (HOSPITAL_COMMUNITY): Payer: Self-pay | Admitting: Physical Therapy

## 2020-05-17 ENCOUNTER — Inpatient Hospital Stay (HOSPITAL_COMMUNITY): Payer: Self-pay

## 2020-05-17 DIAGNOSIS — G8222 Paraplegia, incomplete: Secondary | ICD-10-CM

## 2020-05-17 LAB — RPR
RPR Ser Ql: REACTIVE — AB
RPR Titer: 1:1 {titer}

## 2020-05-17 LAB — BASIC METABOLIC PANEL
Anion gap: 10 (ref 5–15)
BUN: 14 mg/dL (ref 6–20)
CO2: 26 mmol/L (ref 22–32)
Calcium: 9.7 mg/dL (ref 8.9–10.3)
Chloride: 98 mmol/L (ref 98–111)
Creatinine, Ser: 0.53 mg/dL (ref 0.44–1.00)
GFR calc Af Amer: >60 mL/min (ref >60)
GFR calc non Af Amer: >60 mL/min (ref >60)
Glucose, Bld: 125 mg/dL — ABNORMAL HIGH (ref 70–99)
Potassium: 4.2 mmol/L (ref 3.5–5.1)
Sodium: 134 mmol/L — ABNORMAL LOW (ref 135–145)

## 2020-05-17 LAB — URINALYSIS, ROUTINE W REFLEX MICROSCOPIC
Bilirubin Urine: NEGATIVE
Glucose, UA: NEGATIVE mg/dL
Hgb urine dipstick: NEGATIVE
Ketones, ur: NEGATIVE mg/dL
Leukocytes,Ua: NEGATIVE
Nitrite: NEGATIVE
Protein, ur: NEGATIVE mg/dL
Specific Gravity, Urine: 1.015 (ref 1.005–1.030)
pH: 6 (ref 5.0–8.0)

## 2020-05-17 LAB — URINE CULTURE: Culture: NO GROWTH

## 2020-05-17 LAB — HIV ANTIBODY (ROUTINE TESTING W REFLEX): HIV Screen 4th Generation wRfx: NONREACTIVE

## 2020-05-17 LAB — HEPATITIS C ANTIBODY: HCV Ab: REACTIVE — AB

## 2020-05-17 MED ORDER — DIAZEPAM 2 MG PO TABS
1.0000 mg | ORAL_TABLET | Freq: Every day | ORAL | Status: DC
  Administered 2020-05-18 – 2020-06-03 (×17): 1 mg via ORAL
  Filled 2020-05-17 (×17): qty 1

## 2020-05-17 MED ORDER — OXYCODONE HCL ER 10 MG PO T12A
10.0000 mg | EXTENDED_RELEASE_TABLET | Freq: Two times a day (BID) | ORAL | Status: DC
  Administered 2020-05-17 – 2020-06-04 (×36): 10 mg via ORAL
  Filled 2020-05-17 (×37): qty 1

## 2020-05-17 MED ORDER — OXYCODONE HCL 5 MG PO TABS
5.0000 mg | ORAL_TABLET | ORAL | Status: DC | PRN
  Administered 2020-05-17: 5 mg via ORAL
  Administered 2020-05-18 – 2020-05-20 (×6): 10 mg via ORAL
  Administered 2020-05-20: 5 mg via ORAL
  Administered 2020-05-20 – 2020-05-25 (×6): 10 mg via ORAL
  Administered 2020-05-25: 5 mg via ORAL
  Administered 2020-05-25 – 2020-06-04 (×21): 10 mg via ORAL
  Filled 2020-05-17 (×4): qty 2
  Filled 2020-05-17: qty 1
  Filled 2020-05-17 (×32): qty 2

## 2020-05-17 NOTE — Progress Notes (Signed)
Nurse entered room to perform bowel program, per order. Patient states she does not need suppository because she empties just fine on her own. Nurse educated patient on importance of bowel program, patient still denied, and stated she would "call" if she needed help having a bowel movement.

## 2020-05-17 NOTE — Progress Notes (Signed)
Patient with sedation and confusion this am per therapy note. Brighter this afternoon--evaluated during PT session. She is alert and appropriate. Continues to have back spasms>pain limiting activity. No hallucinations and able to answer basic orientation questions without difficulty. Did not eat much lunch "smell made her sick". PT reports that she is clearer and more appropriate today.   Patient has had addition of multiple neurosedating medications over past few days--will d/c gabapentin and robaxin. Decrease OxyContin back to 10 mg bid and wean off valium (added post op) over next 1-2 days. Discussed patient's RPR and HCV results with Dr. Amada Jupiter. He doubts that this is neurosyphilis and feels that changes seen on MRI of brain is chronic issue. Will reach out to ID for input on treatment

## 2020-05-17 NOTE — Progress Notes (Signed)
Physical Therapy Weekly Progress Note  Patient Details  Name: Debra Schmidt MRN: [id number] Date of Birth: 03/15/1961  Beginning of progress report period: May 10, 2020 End of progress report period: May 17, 2020  Today's Date: 05/17/2020 PT Individual Time: 1015-1110 PT Individual Time Calculation (min): 55 min   Patient has met 0 of 4 short term goals.  Pt performs bed mobility with mod A, transfers with Ax2 for safety, and gait x10 feet with RW and Ax2 for safety. Have not yet initiated stair training d/t safety/medical concern. Patient's significant low back pain has limited her tolerance for out of bed mobility and functional activities. In addition, pt has had intermittent confusion limiting participation throughout sessions.  Patient continues to demonstrate the following deficits muscle weakness, decreased cardiorespiratoy endurance, decreased motor planning, decreased awareness, decreased problem solving and decreased memory and decreased sitting balance, decreased standing balance, decreased postural control and decreased balance strategies and therefore will continue to benefit from skilled PT intervention to increase functional independence with mobility.  Patient progressing toward long term goals..  Continue plan of care.   PT Short Term Goals Week 1:  PT Short Term Goal 1 (Week 1): Pt will perform bed mobility with min A PT Short Term Goal 1 - Progress (Week 1): Progressing toward goal PT Short Term Goal 2 (Week 1): Pt will perform transfers with min A PT Short Term Goal 2 - Progress (Week 1): Progressing toward goal PT Short Term Goal 3 (Week 1): Pt will ambulate 50 feet with RW and min A PT Short Term Goal 3 - Progress (Week 1): Progressing toward goal PT Short Term Goal 4 (Week 1): Pt will initiate stair training PT Short Term Goal 4 - Progress (Week 1): Progressing toward goal Week 2:  PT Short Term Goal 1 (Week 2): STG = LTG d/t ELOS  Skilled Therapeutic  Interventions/Progress Updates:  Pt received semi-reclined in bed. Agreeable to therapy. When asked about her pain, pt states "I'm doing okay" at rest in bed prior to starting therapy session. Addressed patient's pain with positioning,frequent rest breaks, and distractionthroughout session. Supine to sit in bed with mod A and verbal cues required for hand placement on bed rail. While seated at EOB, pt uses B/L hands to brace herself and demonstrates significant posterior lean despite multiple verbal cues for upright posture. Lumbar corset donned with pt seated at EOB by therapist. Stand pivot transfer from EOB to W/C with RW and A x 2 (max A by one person, mod A by second person). Buckling of RLE noted while performing stand pivot transfer. Dependent W/C transfer to/from therapy gym for time conservation. In the parallel bars, performed sit to stand from W/C x5 reps with B/L knees blocked by therapist, pt holding onto both parallel bars, and min A. Attempted ambulation while pt was standing in parallel bars, but pt reports onset of significant low back pain so exercise was deferred. Pt propelled W/C 30 feet using BUEs with close supervision, but was unable to propel herself farther d/t significant low back pain. On return to room, performed squat pivot transfer from W/C to EOB with mod A. Lumbar corset doffed with pt seated at EOB by therapist. Sit to supine in bed with mod A d/t LE management. Pt left semi-reclined in bed with all needs in reach. Pt appeared increasingly confused throughout session and exhibited difficulty following commands.  Therapy Documentation Precautions:  Precautions Precautions: Fall, Back Precaution Booklet Issued: No Precaution Comments:  (Reviewed back  precautions) Required Braces or Orthoses: Spinal Brace Spinal Brace: Lumbar corset, Applied in sitting position Restrictions Weight Bearing Restrictions: No RUE Weight Bearing: Weight bearing as tolerated Other  Position/Activity Restrictions: no overhead movement RUE  Therapy/Group: Individual Therapy  Nadeen Landau, SPT 05/17/2020, 3:42 PM

## 2020-05-17 NOTE — Consult Note (Signed)
Requesting Physician: Dr. Clair Gulling    Chief Complaint: Altered mental status  History obtained from: Patient and Chart     HPI:                                                                                                                                       Debra Schmidt is a 59 y.o. female with past medical history of drug abuse presenting with 3 weeks of back pain and lower extremity weakness secondary to severe intraspinal abscess resulting in compression of the lower thoracic spinal cord and conus, L1/L2 discitis as well as osteomyelitis status post surgical decompression, MSSA bacteremia on IV antibiotics with ID following.  Due to impaired cognition, MRI brain was ordered which shows subcortical lesions of unclear etiology.  Patient denies any abnormal sensory symptoms, difficulty walking, difficulty with speech.  Denies appropriate loss, tick bites, fevers or chills.  Neurology was consulted for further recommendations.  Labs on on presentation on 6/14 included: UDS which was negative, urine analysis which was negative, blood culture showing Staph aureus, ESR 110, A1c 6.7.  Potassium 2  Past medical history -As noted above   Past Surgical History:  Procedure Laterality Date  . ACROMIO-CLAVICULAR JOINT REPAIR Right 04/25/2020   Procedure: ACROMIO-CLAVICULAR JOINT REPAIR;  Surgeon: Renette Butters, MD;  Location: [place];  Service: Orthopedics;  Laterality: Right;  . I & D EXTREMITY Right 04/25/2020   Procedure: Open debridement R AC joint; Distal clavicle repair;  Surgeon: Renette Butters, MD;  Location: [place];  Service: Orthopedics;  Laterality: Right;  . LUMBAR LAMINECTOMY/DECOMPRESSION MICRODISCECTOMY N/A 04/23/2020   Procedure: Lumbar One-Two LAMINECTOMY for Epidural Abscess Evacuation;  Surgeon: Ashok Pall, MD;  Location: [place];  Service: Neurosurgery;  Laterality: N/A;  . RESECTION DISTAL CLAVICAL Right 04/25/2020   Procedure: RESECTION DISTAL CLAVICAL;  Surgeon: Renette Butters, MD;  Location: [place];  Service: Orthopedics;  Laterality: Right;    Family History  Problem Relation Age of Onset  . Cancer Mother   . Cancer Father    Social History:  reports that she has been smoking cigarettes. She has been smoking about 0.50 packs per day. She has never used smokeless tobacco. She reports previous alcohol use. She reports previous drug use.  Allergies:  Allergies  Allergen Reactions  . Penicillins Hives    Tolerated Zosyn and Cefazolin 04/2020    Medications:  I reviewed home medications   ROS:                                                                                                                                     14 systems reviewed and negative except above    Examination:                                                                                                      General: Appears well-developed and well-nourished.  Psych: Affect appropriate to situation Eyes: No scleral injection HENT: No OP obstrucion Head: Normocephalic.  Cardiovascular: Normal rate and regular rhythm.  Respiratory: Effort normal and breath sounds normal to anterior ascultation GI: Soft.  No distension. There is no tenderness.  Skin: WDI    Neurological Examination Mental Status: Alert, oriented, thought content appropriate.  Speech fluent without evidence of aphasia. Able to follow 3 step commands without difficulty. Cranial Nerves: II: Visual fields grossly normal,  III,IV, VI: ptosis not present, extra-ocular motions intact bilaterally, pupils equal, round, reactive to light and accommodation V,VII: smile symmetric, facial light touch sensation normal bilaterally VIII: hearing normal bilaterally IX,X: uvula rises symmetrically XI: bilateral shoulder shrug XII: midline tongue extension Motor: Right : Upper extremity    5/5    Left:     Upper extremity   5/5  Lower extremity   3/5     Lower extremity   3/5 Tone and bulk:normal tone throughout; no atrophy noted Sensory: Pinprick and light touch intact throughout, bilaterally Plantars: Right: downgoing   Left: downgoing Cerebellar: normal finger-to-nose    Lab Results: Basic Metabolic Panel: Recent Labs  Lab 05/10/20 0500 05/12/20 0426 05/15/20 0328  NA 138 135 136  K 3.8 4.0 4.2  CL 102 100 100  CO2 28 26 26   GLUCOSE 131* 124* 114*  BUN 9 8 12   CREATININE 0.45 0.50 0.53  CALCIUM 9.5 9.4 9.8  MG 2.0  --   --     CBC: Recent Labs  Lab 05/10/20 0500 05/15/20 0328  WBC 5.7 5.3  NEUTROABS 3.7 3.2  HGB 8.1* 8.5*  HCT 27.1* 27.8*  MCV 88.0 87.7  PLT 512* 417*    Coagulation Studies: No results for input(s): LABPROT, INR in the last 72 hours.  Imaging: CT HEAD WO CONTRAST  Addendum Date: 05/16/2020   ADDENDUM REPORT: 05/16/2020 08:42 ADDENDUM: Please note the first line of the impression should read: 3 cm focus of ill-defined hypodensity within the right frontoparietal white matter,  which is suspicious for recent white matter infarct or other acute intracranial process. Contrast-enhanced brain MRI is recommended for further evaluation. Electronically Signed   By: Kellie Simmering DO   On: 05/16/2020 08:42   Addendum Date: 05/15/2020   ADDENDUM REPORT: 05/15/2020 21:46 ADDENDUM: These results were called by telephone at the time of interpretation on 05/15/2020 at 9:45 pm to provider NP Danella Sensing, who verbally acknowledged these results. Electronically Signed   By: Kellie Simmering DO   On: 05/15/2020 21:46   Result Date: 05/16/2020 CLINICAL DATA:  Provided history: Mental status change, persistent or worsening effusion. Increased drowsiness. EXAM: CT HEAD WITHOUT CONTRAST TECHNIQUE: Contiguous axial images were obtained from the base of the skull through the vertex without intravenous contrast. COMPARISON:  No pertinent prior studies available for  comparison. FINDINGS: Brain: There is a 3 cm focus of hypodensity within the right frontoparietal white matter which is suspicious for recent white matter infarct or other acute intracranial process. Subtle ill-defined hypoattenuation is also present within the left parietooccipital subcortical white matter. 7 mm focus of hypodensity within the anterior genu of the corpus callosum/paramedian subcortical anterior left frontal lobe (series 3, image 12). There is no acute intracranial hemorrhage. No extra-axial fluid collection. No evidence of intracranial mass. No midline shift. Vascular: No hyperdense vessel. Skull: Normal. Negative for fracture or focal lesion. Sinuses/Orbits: Visualized orbits show no acute finding. No significant paranasal sinus disease or mastoid effusion at the imaged levels. IMPRESSION: 3 mm focus of ill-defined hypodensity within the right frontoparietal white matter, which is suspicious for recent white matter infarct or other acute intracranial process. Contrast-enhanced brain MRI is recommended for further evaluation. Additionally, there is subtle nonspecific ill-defined hypoattenuation within the left parietooccipital subcortical white matter. A fairly well circumscribed focus of hypoattenuation is present within the anterior genu of the corpus callosum/paramedian subcortical anterior left frontal lobe. These foci would also be better characterized with MRI. Electronically Signed: By: Kellie Simmering DO On: 05/15/2020 21:43   MR BRAIN W WO CONTRAST  Result Date: 05/16/2020 CLINICAL DATA:  Initial evaluation for acute encephalopathy, abnormal head CT. EXAM: MRI HEAD WITHOUT AND WITH CONTRAST TECHNIQUE: Multiplanar, multiecho pulse sequences of the brain and surrounding structures were obtained without and with intravenous contrast. CONTRAST:  5.55m GADAVIST GADOBUTROL 1 MMOL/ML IV SOLN COMPARISON:  Prior CT from 05/15/2020. FINDINGS: Brain: Cerebral volume within normal limits. Few  scattered foci of T2/FLAIR hyperintensity involving the periventricular and deep white matter both cerebral hemispheres noted common nonspecific, but most like related to minimal chronic microvascular ischemic disease. Superimposed small remote lacunar infarct present at the left anterior genu of the corpus callosum (series 10, image 14). 3.1 cm focus of T2/FLAIR signal abnormality seen involving the subcortical right parietal lobe, corresponding with abnormality seen on prior CT (series 10, image 16). Additional similar somewhat linear signal change measuring 2.2 cm seen at the subcortical parasagittal left frontal lobe (series 11, image 18). Additional smaller focus seen at the subcortical left parietal lobe (series 11, images 16, 14). Lesions demonstrate somewhat poorly defined and angular margins and places. Suggestion of underlying cystic bubbly change at the right parietal lesion (series 10, image 16). Associated diffusion signal abnormality seen about these lesions. No involvement of the overlying cortical gray matter. No associated susceptibility artifact to suggest hemorrhage. No significant mass effect or discernible enhancement seen following contrast administration. Findings are indeterminate. Remainder the brain is essentially normal in appearance. No evidence for acute or subacute infarct elsewhere within the  brain no encephalomalacia to suggest chronic cortical infarction. No evidence for acute or chronic intracranial hemorrhage. No other mass lesion, mass effect, or midline shift. No hydrocephalus or extra-axial fluid collection. Pituitary gland suprasellar region within normal limits. Midline structures intact. No other abnormal enhancement. Vascular: Major intracranial vascular flow voids are well maintained. Skull and upper cervical spine: Craniocervical junction within normal limits. Bone marrow signal intensity normal. No scalp soft tissue abnormality. Sinuses/Orbits: Globes and orbital soft  tissues within normal limits. Paranasal sinuses are clear. Trace right mastoid effusion noted, of doubtful significance. Inner ear structures grossly normal. Other: None. IMPRESSION: 1. Multifocal nonenhancing T2/FLAIR hyperintense lesions involving the subcortical right greater than left parietal lobes as well as the subcortical parasagittal left frontal lobe as above. Lesions are fairly unusual in appearance, and of uncertain etiology. Differential considerations include changes related to PML, multifocal encephalitis/cerebritis, or possibly multi centric glioma. A possible demyelinating process is considered, although felt to be less likely given appearance. Correlation with CSF analysis may be helpful for further characterization. 2. 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 3. No other acute intracranial abnormality identified. Electronically Signed   By: Jeannine Boga M.D.   On: 05/16/2020 02:24     I have reviewed the above imaging : MRI brain with and without contrast   ASSESSMENT AND PLAN   59 year old female with past medical history of substance abuse, admitted for intraspinal abscess resulting impaired versus, MSSA bacteremia with unusual appearance of MRI brain performed for altered mental status.   Multifocal nonenhancing T2 flair hyperintensities with unusual appearance D/D PML, cerebritis, multicentric glioma, possible demyelinating process (although less likely as he do not enhance and are not periventricular)    Recommendations -HIV, if positive CD4 count, hepatitis C, RPR -continue IV antibiotics for epidural abscess, MSSA bacteremia -Discussed with neurosurgery about safety of doing spinal tap, although this would likely be contaminated in the setting of abscess.     Kiegan Macaraeg [facility] Pager Number [phone]

## 2020-05-17 NOTE — Progress Notes (Signed)
Physical Therapy Session Note  Patient Details  Name: Debra Schmidt MRN: [id number] Date of Birth: 07/04/1961  Today's Date: 05/17/2020 PT Individual Time: 1350-1430 PT Individual Time Calculation (min): 40 min   Short Term Goals: Week 1:  PT Short Term Goal 1 (Week 1): Pt will perform bed mobility with min A PT Short Term Goal 1 - Progress (Week 1): Progressing toward goal PT Short Term Goal 2 (Week 1): Pt will perform transfers with min A PT Short Term Goal 2 - Progress (Week 1): Progressing toward goal PT Short Term Goal 3 (Week 1): Pt will ambulate 50 feet with RW and min A PT Short Term Goal 3 - Progress (Week 1): Progressing toward goal PT Short Term Goal 4 (Week 1): Pt will initiate stair training PT Short Term Goal 4 - Progress (Week 1): Progressing toward goal  Skilled Therapeutic Interventions/Progress Updates:  Patient received in bed, working on lunch- she reports that she feels like she had a spaghetti fight with how much pasta is in her bed. Willing to participate, but needed [place] for rolling and maxA for sidelying to sit, as well as min guard at EOB and cues to maintain back precautions. Very alert and appropriate with PT, PA came in and examined patient during session as well as neurologist. She then requested to go to the bathroom- tried stand-pivot with RW but unable to safely do so so performed squat pivots to/from [facility] and in bathroom with ModA and cues for safety. Unable to urinate. As session went on she remained alert but seemed to become more and more confused- stated that it was "junk month" and eventually clarified that it was June (reoriented to it being July), and required an increasing number of cues for safety as she kept trying to remove both gait belt and her back brace in the bathroom. AFter squat-pivot back to bed and return to supine, she told PT that there had been a number of people with a trifold flier who laid in her bed with her for a week, also needed  repeated explanation and sequencing for rolling to adjust underwear and pants. Left in bed with all needs met, bed alarm active this afternoon.   Therapy Documentation Precautions:  Precautions Precautions: Fall, Back Precaution Booklet Issued: No Precaution Comments:  (Reviewed back precautions) Required Braces or Orthoses: Spinal Brace Spinal Brace: Lumbar corset, Applied in sitting position Restrictions Weight Bearing Restrictions: No RUE Weight Bearing: Weight bearing as tolerated Other Position/Activity Restrictions: no overhead movement RUE Pain: Pain Assessment Pain Scale: Faces Faces Pain Scale: Hurts whole lot Pain Type: Surgical pain Pain Location: Back Pain Orientation: Lower Pain Descriptors / Indicators: Aching;Sharp Pain Onset: On-going Patients Stated Pain Goal: 0 Pain Intervention(s): Repositioned;Emotional support    Therapy/Group: Individual Therapy   Windell Norfolk, DPT, PN1   Supplemental Physical Therapist [place]    Pager [phone] Acute Rehab Office [phone]   05/17/2020, 3:42 PM

## 2020-05-17 NOTE — Progress Notes (Signed)
Subjective: Relatively alert.  She thinks that the episodes of confusion are temporally correlated to her pain/anxiety medicines.  Exam: Vitals:   05/17/20 0351 05/17/20 1333  BP: 95/66 97/78  Pulse: 92 98  Resp: 17 18  Temp: 98.5 F (36.9 C) 97.9 F (36.6 C)  SpO2: 98% 100%   Gen: In bed, NAD Resp: non-labored breathing, no acute distress Abd: soft, nt  Neuro: MS: Awake, alert, gives month as June, but otherwise oriented and appropriate. CN: Pupils equal round and reactive, extraocular movements intact Motor: She has bilateral lower extremity weakness, good strength in her uppers  Pertinent Labs: RPR-positive Hepatitis C-positive  Impression: 59 year old female who presented with lumbar epidural abscess status post surgical intervention.  She had some confusion, which at this point I think is most likely medication related, and had an MRI which is definitely abnormal.  Given that she does not have HIV I think that PML is markedly less likely.  Neurosyphilis I think is also relatively unlikely.  I am not sure that CSF sampling would be at all beneficial at this time given that I would expect marked abnormalities due to her recent infection.  Even if we did a CSF VDRL, if it was positive then this would be difficult to differentiate between breakdown of blood-brain barrier versus true positive.    Demyelinating disease I think is a possibility.  The presence of "holes" on the MRI make me strongly suspect that this is a chronic process, what ever it is.  I would favor repeating the imaging over a relatively short period, few weeks to a month to ensure stability.  Recommendations: 1) discuss with ID need for further testing or treatment regarding positive HCV and RPR 2) repeat MRI in 3-4 weeks, or sooner if symptoms indicate need.  Ritta Slot, MD [facility] [phone]  If 7pm- 7am, please page neurology on call as listed in AMION.

## 2020-05-17 NOTE — Progress Notes (Signed)
[place] PHYSICAL MEDICINE & REHABILITATION PROGRESS NOTE   Subjective/Complaints: No complaints this morning. Appears sleepier than usual but easily arousable.  Discussed with her husband yesterday the results of her imaging findings and plan of care.   ROS: Pt denies SOB, abd pain, CP, N/V/C/D, and vision changes  Objective:   CT HEAD WO CONTRAST  Addendum Date: 05/16/2020   ADDENDUM REPORT: 05/16/2020 08:42 ADDENDUM: Please note the first line of the impression should read: 3 cm focus of ill-defined hypodensity within the right frontoparietal white matter, which is suspicious for recent white matter infarct or other acute intracranial process. Contrast-enhanced brain MRI is recommended for further evaluation. Electronically Signed   By: Kellie Simmering DO   On: 05/16/2020 08:42   Addendum Date: 05/15/2020   ADDENDUM REPORT: 05/15/2020 21:46 ADDENDUM: These results were called by telephone at the time of interpretation on 05/15/2020 at 9:45 pm to provider NP Danella Sensing, who verbally acknowledged these results. Electronically Signed   By: Kellie Simmering DO   On: 05/15/2020 21:46   Result Date: 05/16/2020 CLINICAL DATA:  Provided history: Mental status change, persistent or worsening effusion. Increased drowsiness. EXAM: CT HEAD WITHOUT CONTRAST TECHNIQUE: Contiguous axial images were obtained from the base of the skull through the vertex without intravenous contrast. COMPARISON:  No pertinent prior studies available for comparison. FINDINGS: Brain: There is a 3 cm focus of hypodensity within the right frontoparietal white matter which is suspicious for recent white matter infarct or other acute intracranial process. Subtle ill-defined hypoattenuation is also present within the left parietooccipital subcortical white matter. 7 mm focus of hypodensity within the anterior genu of the corpus callosum/paramedian subcortical anterior left frontal lobe (series 3, image 12). There is no acute intracranial  hemorrhage. No extra-axial fluid collection. No evidence of intracranial mass. No midline shift. Vascular: No hyperdense vessel. Skull: Normal. Negative for fracture or focal lesion. Sinuses/Orbits: Visualized orbits show no acute finding. No significant paranasal sinus disease or mastoid effusion at the imaged levels. IMPRESSION: 3 mm focus of ill-defined hypodensity within the right frontoparietal white matter, which is suspicious for recent white matter infarct or other acute intracranial process. Contrast-enhanced brain MRI is recommended for further evaluation. Additionally, there is subtle nonspecific ill-defined hypoattenuation within the left parietooccipital subcortical white matter. A fairly well circumscribed focus of hypoattenuation is present within the anterior genu of the corpus callosum/paramedian subcortical anterior left frontal lobe. These foci would also be better characterized with MRI. Electronically Signed: By: Kellie Simmering DO On: 05/15/2020 21:43   MR BRAIN W WO CONTRAST  Result Date: 05/16/2020 CLINICAL DATA:  Initial evaluation for acute encephalopathy, abnormal head CT. EXAM: MRI HEAD WITHOUT AND WITH CONTRAST TECHNIQUE: Multiplanar, multiecho pulse sequences of the brain and surrounding structures were obtained without and with intravenous contrast. CONTRAST:  5.70m GADAVIST GADOBUTROL 1 MMOL/ML IV SOLN COMPARISON:  Prior CT from 05/15/2020. FINDINGS: Brain: Cerebral volume within normal limits. Few scattered foci of T2/FLAIR hyperintensity involving the periventricular and deep white matter both cerebral hemispheres noted common nonspecific, but most like related to minimal chronic microvascular ischemic disease. Superimposed small remote lacunar infarct present at the left anterior genu of the corpus callosum (series 10, image 14). 3.1 cm focus of T2/FLAIR signal abnormality seen involving the subcortical right parietal lobe, corresponding with abnormality seen on prior CT (series  10, image 16). Additional similar somewhat linear signal change measuring 2.2 cm seen at the subcortical parasagittal left frontal lobe (series 11, image 18). Additional smaller focus  seen at the subcortical left parietal lobe (series 11, images 16, 14). Lesions demonstrate somewhat poorly defined and angular margins and places. Suggestion of underlying cystic bubbly change at the right parietal lesion (series 10, image 16). Associated diffusion signal abnormality seen about these lesions. No involvement of the overlying cortical gray matter. No associated susceptibility artifact to suggest hemorrhage. No significant mass effect or discernible enhancement seen following contrast administration. Findings are indeterminate. Remainder the brain is essentially normal in appearance. No evidence for acute or subacute infarct elsewhere within the brain no encephalomalacia to suggest chronic cortical infarction. No evidence for acute or chronic intracranial hemorrhage. No other mass lesion, mass effect, or midline shift. No hydrocephalus or extra-axial fluid collection. Pituitary gland suprasellar region within normal limits. Midline structures intact. No other abnormal enhancement. Vascular: Major intracranial vascular flow voids are well maintained. Skull and upper cervical spine: Craniocervical junction within normal limits. Bone marrow signal intensity normal. No scalp soft tissue abnormality. Sinuses/Orbits: Globes and orbital soft tissues within normal limits. Paranasal sinuses are clear. Trace right mastoid effusion noted, of doubtful significance. Inner ear structures grossly normal. Other: None. IMPRESSION: 1. Multifocal nonenhancing T2/FLAIR hyperintense lesions involving the subcortical right greater than left parietal lobes as well as the subcortical parasagittal left frontal lobe as above. Lesions are fairly unusual in appearance, and of uncertain etiology. Differential considerations include changes related to  PML, multifocal encephalitis/cerebritis, or possibly multi centric glioma. A possible demyelinating process is considered, although felt to be less likely given appearance. Correlation with CSF analysis may be helpful for further characterization. 2. 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 3. No other acute intracranial abnormality identified. Electronically Signed   By: Jeannine Boga M.D.   On: 05/16/2020 02:24   Recent Labs    05/15/20 0328  WBC 5.3  HGB 8.5*  HCT 27.8*  PLT 417*   Recent Labs    05/15/20 0328 05/17/20 0347  NA 136 134*  K 4.2 4.2  CL 100 98  CO2 26 26  GLUCOSE 114* 125*  BUN 12 14  CREATININE 0.53 0.53  CALCIUM 9.8 9.7    Intake/Output Summary (Last 24 hours) at 05/17/2020 1326 Last data filed at 05/17/2020 0830 Gross per 24 hour  Intake 340 ml  Output 2100 ml  Net -1760 ml     Physical Exam: Vital Signs Blood pressure 95/66, pulse 92, temperature 98.5 F (36.9 C), resp. rate 17, height 5' 4"  (1.626 m), weight 54.4 kg, SpO2 98 %. General: Alert and oriented x 3, No apparent distress HEENT: Head is normocephalic, atraumatic, PERRLA, EOMI, sclera anicteric, oral mucosa pink and moist, dentition intact, ext ear canals clear, poor dentition  Neck: Muscles in upper traps and rhomboids tight still B/L Heart: Reg rate and rhythm. No murmurs rubs or gallops Chest: CTA bilaterally without wheezes, rales, or rhonchi; no distress Abdomen: Soft, non-tender, non-distended, bowel sounds positive. Extremities: No clubbing, cyanosis, or edema. Pulses are 2+ Skin: incision looks great- no drainage, no erythema- a little puffy, but good Neuro: Deltoids, biceps 4/5 B/L; Triceps, WE, grip and finger abd 4-/5 B/L LEs- HF 2+/5 on R; 2/5 on L; KE 2+/5, DF 2/5, and PF 3/5 B/L5/5.  Psych: Pt's affect is anxious secondary to pain  Assessment/Plan: 1. 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  Physiatrist is providing  close team supervision and 24 hour management of active medical problems listed below.  Physiatrist and rehab team continue to assess barriers to discharge/monitor patient progress toward functional and medical goals  Care Tool:  Bathing    Body parts bathed by patient: Chest, Abdomen, Front perineal area, Right upper leg, Left upper leg, Face   Body parts bathed by helper: Buttocks, Right arm, Left arm, Right lower leg, Left lower leg     Bathing assist Assist Level: Moderate Assistance - Patient 50 - 74%     Upper Body Dressing/Undressing Upper body dressing   What is the patient wearing?: Hospital gown only    Upper body assist Assist Level: Maximal Assistance - Patient 25 - 49%    Lower Body Dressing/Undressing Lower body dressing      What is the patient wearing?: Pants     Lower body assist Assist for lower body dressing: Moderate Assistance - Patient 50 - 74%     Toileting Toileting    Toileting assist Assist for toileting: Maximal Assistance - Patient 25 - 49%     Transfers Chair/bed transfer  Transfers assist     Chair/bed transfer assist level: 2 Helpers     Locomotion Ambulation   Ambulation assist      Assist level: 2 helpers Assistive device: Walker-rolling Max distance: 12f   Walk 10 feet activity   Assist     Assist level: 2 helpers Assistive device: Walker-rolling   Walk 50 feet activity   Assist Walk 50 feet with 2 turns activity did not occur: Safety/medical concerns         Walk 150 feet activity   Assist Walk 150 feet activity did not occur: Safety/medical concerns         Walk 10 feet on uneven surface  activity   Assist Walk 10 feet on uneven surfaces activity did not occur: Safety/medical concerns         Wheelchair     Assist Will  patient use wheelchair at discharge?:  (TBD)             Wheelchair 50 feet with 2 turns activity    Assist            Wheelchair 150 feet activity     Assist          Blood pressure 95/66, pulse 92, temperature 98.5 F (36.9 C), resp. rate 17, height 5' 4"  (1.626 m), weight 54.4 kg, SpO2 98 %.  Medical Problem List and Plan: 1.  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  Antithrombotics: -DVT/anticoagulation:  Mechanical: Sequential compression devices, below knee Bilateral lower extremities   - Dopplers negative.              -antiplatelet therapy: N/A 3. Pain Management: OxyContin 20 mg bid with oxycodone prn. Continues to have a lot of pain due to back spasms--robaxin ineffective. Will change to flexeril 566mtid scheduled. Will decrease valium to 2 mg bid prn. - appeared that RN's not giving prns very often, so, will see what she needs  6/30- increase Valium to 5 mg BID and make scheduled; con't oxycontin but move to 6am and 6pm- and con't flexeril and oxy prn.q3 H prn  7/1: added  Robaxin which she is asking for this morning.  7/2: Improved but still limiting therapy.. Schedule Tylenol.  7/3 sedated and confused, will back down on Valium to 63m - appears brighter and more alert today   7/5: appears more alert. Start Gabapentin 3041mTID for left sided sciatic pain that is currently most severe to her  7/6: states that Gabapentin was helpful. Increase long-acting Oxycontin to 2027m12H.   7/7: pain appears to be better controlled Flector patch to paraspinal area  4. Mood: LCSW to follow for evaluation and support.              -antipsychotic agents:  N/A 5. Neuropsych: This patient is capable of making decisions on her own behalf. 6. Skin/Wound Care:  Monitor incisions for healing. Continue Juven to promote wound healing.  7. Fluids/Electrolytes/Nutrition: Monitor I/O. Check lytes in am.  8. Disseminated MSSA infection with osteomyelitis, epidural abscess and R septic shoulder: Cefepime X 6 weeks with weekly CBC/BMP/ESR/CRP-end date 06/03/20.  9. HTN: Monitor BP tid--on Norvasc and Lisinopril daily.   6/30- BP well controlled;  will monitor for orthostatic hypotension  7/1: Hypotensive. Stop Amlodipine.   7/2: Hypotensive: Stop Lisinopril.  Vitals:   05/16/20 1936 05/17/20 0351  BP: 110/84 95/66  Pulse: 97 92  Resp: 17 17  Temp: 98.3 F (36.8 C) 98.5 F (36.9 C)  SpO2: 97% 98%  Soft BPs  May improve with reduction in valium  7/5: Bps continue to be soft. Stop Flomax.   7/6: Bps continue to be soft. Decrease Valium to 1mg29mD.   7/7: Bps continue to be soft and she continues to appear sedated. Decrease Valium to HS 10. Epidural abscess with diskitis: On IV antibiotics end date 06/03/20  11. Chronic pancreatitis: Abdominal symptoms have resolved. To follow with [facility] for Lap chole on outpatient basis.  12. Hypokalemia: Hypomagnesemia has resolved with supplement. Will continue to keep Kdur at 40 meq bid as tends to drop when decreased once a day. Will monitor lytes MWF for now.   6/30- K+ much better 3.8- will check frequently 13. Acute on chronic anemia: H/H stable. Recheck in am.  14. Neurogenic bladder/Urinary retention: Remove foley in am and start bladder training.  6/30- remove foley today  Requires I/O cath on flomax add low dose urecholine 10mg72m - pt thinks "bladder is ok" 15. Neurogenic bowel- will see if she needs bowel program- is likely since BM just "fell out" this AM when transferring- no control of it.   6/30- see if needs Bowel program  7/1: moving bowels regularly.  7/4 cont of bowel   7/5: Had BM this morning.  15. Impaired cognition: MRI ordered and shows subcortical lesions of unclear etiology: differential  includes PML, infarct, glioma- neurology and ID consulted. Dr. KirkpLeonel Ramsay not recommend LP due to discitis but questioned subacute stroke. Dr. ComerLinus Salmons not feel septic emboli is cause of changes and recommends no change in antibiotic regimen. Neurology to evaluate.   7/7: Appreciate neurology eval. HIV ordered and nonreactive. 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 Will discuss with ID   LOS: 8 days A FACE TO FACE EVALUATION WAS PERFORMED  KrutiMartha Clanulkar 05/17/2020, 1:26 PM

## 2020-05-17 NOTE — Progress Notes (Signed)
Occupational Therapy Session Note  Patient Details  Name: Debra Schmidt MRN: [id number] Date of Birth: 22-Jun-1961  Today's Date: 05/17/2020 OT Individual Time: 2202-5427 OT Individual Time Calculation (min): 45 min    Short Term Goals: Week 1:  OT Short Term Goal 1 (Week 1): Pt will perform toileting with min A overall. OT Short Term Goal 2 (Week 1): Pt will perform LB dressing with use of AE or modifcations with min A. OT Short Term Goal 3 (Week 1): Pt perform toilet transfer with min A. OT Short Term Goal 4 (Week 1): Pt will stand for 2 minutes with min A or less for balance during self care tasks.  Skilled Therapeutic Interventions/Progress Updates:    Pt received eating breakfast. Pt reporting 7/10 pain in her neck and lumbar spine, reporting she is premedicated. Pt also reporting she has already completed ADLs with nursing staff. Pt transitioned to EOB with max A. Lumbar corset donned max A. While EOB pt with posterior lean 2/2 pain and requiring fluctuating mod-max A for sitting balance. Pt completed squat pivot transfer to the w/c with mod A. Gown and underwear donned with max A. Pt then able to stand from w/c with min A, using sink for support. Suspect pain is limiting mobility more this session than weakness. Pt completed oral care at the sink with setup assist. Pt requested to return to bed. Min A for stand pivot this transfer. Mod A to return BLE to bed. Pt left supine with all needs met, bed alarm set. Rn notified about air in line in IV and request for more pain medication.   Therapy Documentation Precautions:  Precautions Precautions: Fall, Back Precaution Booklet Issued: No Precaution Comments:  (Reviewed back precautions) Required Braces or Orthoses: Spinal Brace Spinal Brace: Lumbar corset, Applied in sitting position Restrictions Weight Bearing Restrictions: No RUE Weight Bearing: Weight bearing as tolerated Other Position/Activity Restrictions: no overhead movement  RUE   Therapy/Group: Individual Therapy  Curtis Sites 05/17/2020, 7:16 AM

## 2020-05-18 ENCOUNTER — Inpatient Hospital Stay (HOSPITAL_COMMUNITY): Payer: Self-pay

## 2020-05-18 ENCOUNTER — Inpatient Hospital Stay (HOSPITAL_COMMUNITY): Payer: Self-pay | Admitting: Physical Therapy

## 2020-05-18 LAB — BASIC METABOLIC PANEL
Anion gap: 9 (ref 5–15)
BUN: 9 mg/dL (ref 6–20)
CO2: 27 mmol/L (ref 22–32)
Calcium: 9.8 mg/dL (ref 8.9–10.3)
Chloride: 98 mmol/L (ref 98–111)
Creatinine, Ser: 0.52 mg/dL (ref 0.44–1.00)
GFR calc Af Amer: >60 mL/min (ref >60)
GFR calc non Af Amer: >60 mL/min (ref >60)
Glucose, Bld: 116 mg/dL — ABNORMAL HIGH (ref 70–99)
Potassium: 4.1 mmol/L (ref 3.5–5.1)
Sodium: 134 mmol/L — ABNORMAL LOW (ref 135–145)

## 2020-05-18 LAB — T.PALLIDUM AB, TOTAL: T Pallidum Abs: NONREACTIVE

## 2020-05-18 MED ORDER — DIAZEPAM 2 MG PO TABS
1.0000 mg | ORAL_TABLET | Freq: Every evening | ORAL | Status: DC | PRN
  Administered 2020-06-03: 1 mg via ORAL
  Filled 2020-05-18 (×3): qty 1

## 2020-05-18 MED ORDER — CYCLOBENZAPRINE HCL 5 MG PO TABS
5.0000 mg | ORAL_TABLET | Freq: Two times a day (BID) | ORAL | Status: DC
  Administered 2020-05-18 – 2020-06-01 (×28): 5 mg via ORAL
  Filled 2020-05-18 (×29): qty 1

## 2020-05-18 MED ORDER — BACLOFEN 5 MG HALF TABLET
5.0000 mg | ORAL_TABLET | Freq: Every day | ORAL | Status: DC
  Administered 2020-05-18 – 2020-06-03 (×17): 5 mg via ORAL
  Filled 2020-05-18 (×19): qty 1

## 2020-05-18 NOTE — Progress Notes (Signed)
Occupational Therapy Session Note  Patient Details  Name: Debra Schmidt MRN: [id number] Date of Birth: 02-25-1961  Today's Date: 05/18/2020 OT Individual Time: 1829-9371 OT Individual Time Calculation (min): 45 min  and Today's Date: 05/18/2020 OT Missed Time: 15 Minutes Missed Time Reason: Pain   Short Term Goals: Week 2:  OT Short Term Goal 1 (Week 2): STG=LTG secondary to ELOS OT Short Term Goal 4 - Progress (Week 2): Progressing toward goal  Skilled Therapeutic Interventions/Progress Updates:    Pt sitting in w/c upon arrival.  OT intervention with focus on unsupported sitting balance, sit<>stand, standing balance, bed mobility, safety awareness, and activity tolerance. PT requires min A for sitting balance with BUE support.  Pt with hesitant to switch to unilateral UE support when sitting unsupported. Sit<>stand with min A. Standing balance with min/mod A with fatigue. Initiated stand pivot transfer but pt unable to come into upright position and squat pivot transfer completed with mod A. Pt required assistance placing BLE onto bed with max verbal cues for back precautions when returning to supine. Pt requested several times to go home for a couple of hours to visit her family.  Educated pt on hospital policies and visit home would not be possible. Pt verbalized understanding but continued to request visit home. Pt stated she couldn't do any more and remained in bed with bed alarm activated and all needs within reach. Pt requested pain meds and RN Carlena Sax notified.   Therapy Documentation Precautions:  Precautions Precautions: Fall, Back Precaution Booklet Issued: No Precaution Comments:  (Reviewed back precautions) Required Braces or Orthoses: Spinal Brace Spinal Brace: Lumbar corset, Applied in sitting position Restrictions Weight Bearing Restrictions: No RUE Weight Bearing: Weight bearing as tolerated Other Position/Activity Restrictions: no overhead movement  RUE General: General OT Amount of Missed Time: 15 Minutes   Pain:  PT c/o 8/10 pain in back and LLE; RN notified and repositioned   Therapy/Group: Individual Therapy  Rich Brave 05/18/2020, 1:14 PM

## 2020-05-18 NOTE — Progress Notes (Signed)
Results from neurology reviewed.  RPR positive at 1:1 but waiting on confirmatory testing with the T pallidum Ab.  If confirmed, I would recommend an LP for VDRL, cell count, protein, glucose for consideration of treatment for neurosyphilis, depending on the results.   I will check a hepatitis C RNA, though I do not suspect this is related to the CNS findings.   Gardiner Barefoot, MD

## 2020-05-18 NOTE — Progress Notes (Signed)
Occupational Therapy Session Note  Patient Details  Name: MARDY HOPPE MRN: [id number] Date of Birth: 1961/02/20  Today's Date: 05/18/2020 OT Individual Time: 2202-5427 OT Individual Time Calculation (min): 45 min    Short Term Goals: Week 1:  OT Short Term Goal 1 (Week 1): Pt will perform toileting with min A overall. OT Short Term Goal 1 - Progress (Week 1): Progressing toward goal OT Short Term Goal 2 (Week 1): Pt will perform LB dressing with use of AE or modifcations with min A. OT Short Term Goal 2 - Progress (Week 1): Progressing toward goal OT Short Term Goal 3 (Week 1): Pt perform toilet transfer with min A. OT Short Term Goal 3 - Progress (Week 1): Met OT Short Term Goal 4 (Week 1): Pt will stand for 2 minutes with min A or less for balance during self care tasks. OT Short Term Goal 4 - Progress (Week 1): Progressing toward goal  Skilled Therapeutic Interventions/Progress Updates:    1:1. Pt received in bed agreeable to OT with unrated back pain but RN alerted to deliver medicaiton. Pt completes supin>sitting with MOD A for trunk elevation. Pt much more clear cognitively this morning compaired to last time this clinician treated patient. Pt with less pain  Behaviors/no yelling with mobility. Min-mod A at EOB while pt threads BLE into button up shirt. MOD A provided to pull shirt around back and buton. Total A to don pants as no reacher present. MIN A provided to STS/SPT to w/c for CM. Pt grooms at sink with supervision. Pt educated on sock aide/reacher use. Pt able ot complete with mod VC. Exited sesison with pt seated in w/c, exit alarm on and call light in reach  Therapy Documentation Precautions:  Precautions Precautions: Fall, Back Precaution Booklet Issued: No Precaution Comments:  (Reviewed back precautions) Required Braces or Orthoses: Spinal Brace Spinal Brace: Lumbar corset, Applied in sitting position Restrictions Weight Bearing Restrictions: No RUE Weight  Bearing: Weight bearing as tolerated Other Position/Activity Restrictions: no overhead movement RUE General:   Vital Signs:   Pain: Pain Assessment Pain Scale: 0-10 Pain Score: Asleep Pain Type: Acute pain Pain Location: Back Pain Orientation: Lower Pain Descriptors / Indicators: Aching Pain Onset: On-going Pain Intervention(s): Medication (See eMAR) ADL:   Vision   Perception    Praxis   Exercises:   Other Treatments:     Therapy/Group: Individual Therapy  Tonny Branch 05/18/2020, 7:21 AM

## 2020-05-18 NOTE — Progress Notes (Signed)
Patient ID: Debra Schmidt, female   DOB: [birth year], 60 y.o.   MRN: [id number]   SW returned phone call/left message [name] [phone]) who had questions related to patient's discharge. SW encouraged follow-up.  Cecile Sheerer, MSW, LCSWA Office: [phone] Cell: [phone] Fax: [phone]

## 2020-05-18 NOTE — Progress Notes (Signed)
[facility] PHYSICAL MEDICINE & REHABILITATION PROGRESS NOTE   Subjective/Complaints: Patient positive for Syphilis and Hepatitis C antibody. Confirmatory tests pending. Patient tearful at this news and reassured her that confirmatory tests would be more accurate.  Mental status is much improved. She requests Valium HS prn for her anxiety and to help her sleep, but her pain has been tolerable despite the decrease in pain medications.   ROS: Pt denies SOB, abd pain, CP, N/V/C/D, and vision changes  Objective:   No results found. No results for input(s): WBC, HGB, HCT, PLT in the last 72 hours. Recent Labs    05/17/20 0347 05/18/20 0511  NA 134* 134*  K 4.2 4.1  CL 98 98  CO2 26 27  GLUCOSE 125* 116*  BUN 14 9  CREATININE 0.53 0.52  CALCIUM 9.7 9.8    Intake/Output Summary (Last 24 hours) at 05/18/2020 1147 Last data filed at 05/18/2020 0855 Gross per 24 hour  Intake 460 ml  Output 1850 ml  Net -1390 ml     Physical Exam: Vital Signs Blood pressure 101/69, pulse 97, temperature 97.8 F (36.6 C), temperature source Oral, resp. rate 16, height _0  (1.626 m), weight 54.4 kg, SpO2 97 %. General: Alert and oriented x 3, No apparent distress HEENT: Head is normocephalic, atraumatic, PERRLA, EOMI, sclera anicteric, oral mucosa pink and moist, poor dentition  Neck: Muscles in upper traps and rhomboids tight still B/L Heart: Reg rate and rhythm. No murmurs rubs or gallops Chest: CTA bilaterally without wheezes, rales, or rhonchi; no distress Abdomen: Soft, non-tender, non-distended, bowel sounds positive. Extremities: No clubbing, cyanosis, or edema. Pulses are 2+ Skin: incision looks great- no drainage, no erythema- a little puffy, but good Neuro: Deltoids, biceps 4/5 B/L; Triceps, WE, grip and finger abd 4-/5 B/L LEs- HF 2+/5 on R; 2/5 on L; KE 2+/5, DF 2/5, and PF 3/5 B/L5/5.  Psych: Pt's affect is anxious secondary to pain   Assessment/Plan: 1. 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  Physiatrist is providing close team supervision and 24 hour management of active medical problems listed below.  Physiatrist and rehab team continue to assess barriers to discharge/monitor patient progress toward functional and medical goals  Care Tool:  Bathing    Body parts bathed by patient: Chest, Abdomen, Front perineal area, Right upper leg, Left upper leg, Face   Body parts bathed by helper: Buttocks, Right arm, Left arm, Right lower leg, Left lower leg     Bathing assist Assist Level: Moderate Assistance - Patient 50 - 74%     Upper Body Dressing/Undressing Upper body dressing   What is the patient wearing?: Hospital gown only    Upper body assist Assist Level: Maximal Assistance - Patient 25 - 49%    Lower Body Dressing/Undressing Lower body dressing      What is the patient wearing?: Pants     Lower body assist Assist for lower body dressing: Moderate Assistance - Patient 50 - 74%     Toileting Toileting    Toileting assist Assist for toileting: Maximal Assistance - Patient 25 - 49%     Transfers Chair/bed transfer  Transfers assist     Chair/bed transfer assist level: 2 Helpers     Locomotion Ambulation   Ambulation assist      Assist level: 2 helpers Assistive device: [place] Max distance:  11f   Walk 10 feet activity   Assist     Assist level: 2 helpers Assistive device: Walker-rolling   Walk 50 feet activity   Assist Walk 50 feet with 2 turns activity did not occur: Safety/medical concerns         Walk 150 feet activity   Assist Walk 150 feet activity did not occur: Safety/medical concerns         Walk 10 feet on uneven surface  activity   Assist Walk 10 feet on uneven surfaces activity did not occur:  Safety/medical concerns         Wheelchair     Assist Will patient use wheelchair at discharge?:  (TBD)      Wheelchair assist level: Supervision/Verbal cueing Max wheelchair distance: 30 feet    Wheelchair 50 feet with 2 turns activity    Assist            Wheelchair 150 feet activity     Assist          Blood pressure 101/69, pulse 97, temperature 97.8 F (36.6 C), temperature source Oral, resp. rate 16, height _0  (1.626 m), weight 54.4 kg, SpO2 97 %.  Medical Problem List and Plan: 1.  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  Antithrombotics: -DVT/anticoagulation:  Mechanical: Sequential compression devices, below knee Bilateral lower extremities   - Dopplers negative.              -antiplatelet therapy: N/A 3. Pain Management: OxyContin 20 mg bid with oxycodone prn. Continues to have a lot of pain due to back spasms--robaxin ineffective. Will change to flexeril 559mtid scheduled. Will decrease valium to 2 mg bid prn. - appeared that RN's not giving prns very often, so, will see what she needs  6/30- increase Valium to 5 mg BID and make scheduled; con't oxycontin but move to 6am and 6pm- and con't flexeril and oxy prn.q3 H prn  7/1: added Robaxin which she is asking for this morning.  7/2: Improved but still limiting therapy.. Schedule Tylenol.  7/3 sedated and confused, will back down on Valium to 35m51m appears brighter and more alert today   7/5: appears more alert. Start Gabapentin 300m53mD for left sided sciatic pain that is currently most severe to her  7/6: states that Gabapentin was helpful. Increase long-acting Oxycontin to 20mg67mH.   7/7: pain appears to be better controlled  7/8: pain stable despite the decrease in  oxycontin Flector patch to paraspinal area  4. Mood: LCSW to follow for evaluation and support.              -antipsychotic agents:  N/A 5. Neuropsych: This patient is capable of making decisions on her own behalf. 6. Skin/Wound Care: Monitor incisions for healing. Continue Juven to promote wound healing.  7. Fluids/Electrolytes/Nutrition: Monitor I/O. Check lytes in am.  8. Disseminated MSSA infection with osteomyelitis, epidural abscess and R septic shoulder: Cefepime X 6 weeks with weekly CBC/BMP/ESR/CRP-end date 06/03/20.  9. HTN: Monitor BP tid--on Norvasc and Lisinopril daily.   6/30- BP well controlled;  will monitor for orthostatic hypotension  7/1: Hypotensive. Stop Amlodipine.   7/2: Hypotensive: Stop Lisinopril.  Vitals:   05/17/20 1930 05/18/20  0316  BP: 107/71 101/69  Pulse: 79 97  Resp: 18 16  Temp: 97.9 F (36.6 C) 97.8 F (36.6 C)  SpO2: 93% 97%  Soft BPs  May improve with reduction in valium  7/5: Bps continue to be soft. Stop Flomax.   7/6: Bps continue to be soft. Decrease Valium to 12m BID.   7/7: Bps continue to be soft and she continues to appear sedated. Decrease Valium to HS  7/8: Continues to be hypotensive but added back prn Valium HS as patient says this greatly helps with anxiety and ability to fall asleep.  10. Epidural abscess with diskitis: On IV antibiotics end date 06/03/20  11. Chronic pancreatitis: Abdominal symptoms have resolved. To follow with [facility] for Lap chole on outpatient basis.  12. Hypokalemia: Hypomagnesemia has resolved with supplement. Will continue to keep Kdur at 40 meq bid as tends to drop when decreased once a day. Will monitor lytes MWF for now.   6/30- K+ much better 3.8- will check frequently 13. Acute on chronic anemia: H/H stable. Recheck in am.  14. Neurogenic bladder/Urinary retention: Remove foley in am and start bladder training.  6/30- remove foley today  Requires I/O cath on flomax add low dose urecholine 137mTID - pt  thinks "bladder is ok" 15. Neurogenic bowel- will see if she needs bowel program- is likely since BM just "fell out" this AM when transferring- no control of it.   6/30- see if needs Bowel program  7/1: moving bowels regularly.  7/4 cont of bowel   7/5: Had BM this morning.  15. Impaired cognition: MRI ordered and shows subcortical lesions of unclear etiology: differential includes PML, infarct, glioma- neurology and ID consulted. Dr. KiLeonel Ramsayoes not recommend LP due to discitis but questioned subacute stroke. Dr. CoLinus Salmonsoes not feel septic emboli is cause of changes and recommends no change in antibiotic regimen. Neurology to evaluate.   7/7: Appreciate neurology eval. HIV ordered and nonreactive. 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 Will discuss with ID  7/8: Discussed diagnosis with patient. Confirmatory tests are pending. Left message with husband asking him to call me back- will discuss results with him then.   >35 minutes spent in care of patient including discussion of positive syphilis and Hepatitis C antibody tests; emotional support regarding these findings and reassurance that we needed to wait for confirmatory tests before we know whether she may have these infections; calling husband to discuss results given that he may need testing; discussion of pain and anxiety; discussion of disability paperwork.    LOS: 9 days A FACE TO FACE EVALUATION WAS PERFORMED  KrClide Deutscheraulkar 05/18/2020, 11:47 AM

## 2020-05-18 NOTE — Progress Notes (Signed)
Physical Therapy Session Note  Patient Details  Name: Debra Schmidt MRN: [id number] Date of Birth: 03/20/1961  Today's Date: 05/18/2020 PT Individual Time: 1345-1408 PT Individual Time Calculation (min): 23 min   Short Term Goals: Week 2:  PT Short Term Goal 1 (Week 2): STG = LTG d/t ELOS  Skilled Therapeutic Interventions/Progress Updates:    Pt seen for make up therapy session. Pt agreeable to participation, requesting to use the bathroom. Pt is mod A for rolling onto L side, max A to sit up to EOB for BLE management and trunk elevation. Pt is dependent to don lumbar corset while seated EOB. Squat pivot transfer to [facility] with max A due to significant pain level in low back. Pt is dependent for clothing management while seated on BSC. Pt unable to void. Squat pivot transfer back to bed with max A. Pt is dependent to doff lumbar corset. Sit to supine min A for some LE management. Rolling L/R in order to don underwear. Pt declines any further participation due to significant pain in her low back. Pain is not rated by patient, pt premedicated prior to start of therapy session. Use of repositioning during session to assist pt with pain management. Pt left semi-reclined in bed with needs in reach, bed alarm in place at end of session. Pt continues to be limited in her ability to functionally progress and participate in sessions due to her back pain and spasms. Pt also continues to exhibit confusion.  Therapy Documentation Precautions:  Precautions Precautions: Fall, Back Precaution Booklet Issued: No Precaution Comments:  (Reviewed back precautions) Required Braces or Orthoses: Spinal Brace Spinal Brace: Lumbar corset, Applied in sitting position Restrictions Weight Bearing Restrictions: No RUE Weight Bearing: Weight bearing as tolerated Other Position/Activity Restrictions: no overhead movement RUE    Therapy/Group: Individual Therapy   Peter Congo, PT, DPT  05/18/2020, 2:24 PM

## 2020-05-18 NOTE — Progress Notes (Signed)
Physical Therapy Session Note  Patient Details  Name: Debra Schmidt MRN: [id number] Date of Birth: November 30, 1960  Today's Date: 05/18/2020 PT Individual Time: 1100-1140 PT Individual Time Calculation (min): 40 min  Missed Time: 20 min Missed Time Reason: Significant low back pain  Short Term Goals: Week 2:  PT Short Term Goal 1 (Week 2): STG = LTG d/t ELOS  Skilled Therapeutic Interventions/Progress Updates:  Pt received semi-reclined in bed in room with sister and husband at bedside. Agreeable to therapy. Pt states she is having "some" low back pain at rest in bed prior to start of therapy session. Addressed patient's pain with positioning,frequent rest breaks, and distractionthroughout session. Supine to sit in bed with mod A and verbal cues required for hand placement on bed rail. While seated at EOB, pt uses B/L hands to brace herself and demonstrates significant posterior lean despite multiple verbal cues for upright posture. Lumbar corset donned with pt seated at EOB by therapist. Squat pivot transfer from EOB to W/C with mod A. Dependent W/C transfer from room to therapy gym for time conservation. In the parallel bars, performed sit to stand from W/C x2 reps with B/L knees blocked by therapist, pt holding onto both parallel bars, and min A. Pt reports onset of significant low back pain while standing so further exercise in the parallel bars was deferred. Pt unwilling to try propelling W/C using BUEs d/t significant low back pain. Dependent W/C transfer from therapy gym to room. On return to room, pt requests to use the bathroom. Squat pivot transfer from W/C to EOB with mod A. Squat pivot transfer from EOB to [facility] with mod A. Pt dependent for clothing management. Pt attempted to void but was unsuccessful. Squat pivot transfer from [facility] to EOB with mod A. Pt performed scooting laterally along EOB with close supervision. Lumbar corset doffed with pt seated at EOB by therapist. Sit to supine in bed  withmod A d/t LE management.Pt left semi-reclined in bed with all needs in reach.Pt appeared increasingly confused throughout session(saying she was outside with her family for [place] this past weekend) and exhibited difficulty following commands. Pt missed 20 minutes of skilled therapy d/t significant low back pain limiting participation throughout session.  Therapy Documentation Precautions:  Precautions Precautions: Fall, Back Precaution Booklet Issued: No Precaution Comments:  (Reviewed back precautions) Required Braces or Orthoses: Spinal Brace Spinal Brace: Lumbar corset, Applied in sitting position Restrictions Weight Bearing Restrictions: No RUE Weight Bearing: Weight bearing as tolerated Other Position/Activity Restrictions: no overhead movement RUE  Therapy/Group: Individual Therapy  Murrell Redden, SPT 05/18/2020, 12:01 PM

## 2020-05-18 NOTE — Progress Notes (Signed)
Occupational Therapy Weekly Progress Note  Patient Details  Name: Debra Schmidt MRN: [id number] Date of Birth: [birth year]  Beginning of progress report period: May 10, 2020 End of progress report period: May 18, 2020  Patient has met 1 of 4 short term goals.  Pt progress has been minimal during the past week.  Pt continues to be limited by increased pain during all transitional movements.  Pt has been unable to tolerate OOB activities for greater then 30 mins. Bathing with mod A. LB dressing with mod A at bed level.  Pt currently prefers hospital gown and requires mod a to don hospital gown.  Functional transfers with min A.  Pt requires max encouragement for active participation in therapies.  Patient continues to demonstrate the following deficits: muscle weakness, decreased cardiorespiratoy endurance, impaired timing and sequencing, unbalanced muscle activation and decreased coordination, decreased awareness, decreased problem solving, decreased safety awareness and decreased memory and decreased sitting balance, decreased standing balance, decreased postural control, decreased balance strategies and difficulty maintaining precautions and therefore will continue to benefit from skilled OT intervention to enhance overall performance with BADL and iADL.  Patient progressing toward long term goals..  Continue plan of care.  OT Short Term Goals Week 1:  OT Short Term Goal 1 (Week 1): Pt will perform toileting with min A overall. OT Short Term Goal 1 - Progress (Week 1): Progressing toward goal OT Short Term Goal 2 (Week 1): Pt will perform LB dressing with use of AE or modifcations with min A. OT Short Term Goal 2 - Progress (Week 1): Progressing toward goal OT Short Term Goal 3 (Week 1): Pt perform toilet transfer with min A. OT Short Term Goal 3 - Progress (Week 1): Met OT Short Term Goal 4 (Week 1): Pt will stand for 2 minutes with min A or less for balance during self care tasks. OT Short  Term Goal 4 - Progress (Week 1): Progressing toward goal Week 2:  OT Short Term Goal 1 (Week 2): STG=LTG secondary to ELOS   Leroy Libman 05/18/2020, 6:18 AM

## 2020-05-19 ENCOUNTER — Inpatient Hospital Stay (HOSPITAL_COMMUNITY): Payer: Self-pay | Admitting: Physical Therapy

## 2020-05-19 ENCOUNTER — Inpatient Hospital Stay (HOSPITAL_COMMUNITY): Payer: Self-pay | Admitting: Occupational Therapy

## 2020-05-19 ENCOUNTER — Inpatient Hospital Stay (HOSPITAL_COMMUNITY): Payer: Self-pay

## 2020-05-19 LAB — BASIC METABOLIC PANEL WITH GFR
Anion gap: 10 (ref 5–15)
BUN: 8 mg/dL (ref 6–20)
CO2: 25 mmol/L (ref 22–32)
Calcium: 9.8 mg/dL (ref 8.9–10.3)
Chloride: 101 mmol/L (ref 98–111)
Creatinine, Ser: 0.48 mg/dL (ref 0.44–1.00)
GFR calc Af Amer: >60 mL/min
GFR calc non Af Amer: >60 mL/min
Glucose, Bld: 113 mg/dL — ABNORMAL HIGH (ref 70–99)
Potassium: 4 mmol/L (ref 3.5–5.1)
Sodium: 136 mmol/L (ref 135–145)

## 2020-05-19 NOTE — Progress Notes (Signed)
   05/19/20 2006  Assess: MEWS Score  Temp (!) 97.5 F (36.4 C)  BP 121/75  Pulse Rate (!) 115  Resp 18  SpO2 99 %  O2 Device Room Air  Assess: MEWS Score  MEWS Temp 0  MEWS Systolic 0  MEWS Pulse 2  MEWS RR 0  MEWS LOC 0  MEWS Score 2  MEWS Score Color Yellow  Assess: if the MEWS score is Yellow or Red  Were vital signs taken at a resting state? Yes  Focused Assessment Documented focused assessment  Early Detection of Sepsis Score *See Row Information* Low  MEWS guidelines implemented *See Row Information* Yes  Treat  MEWS Interventions Administered scheduled meds/treatments  Take Vital Signs  Increase Vital Sign Frequency  Yellow: Q 2hr X 2 then Q 4hr X 2, if remains yellow, continue Q 4hrs  Escalate  MEWS: Escalate Yellow: discuss with charge nurse/RN and consider discussing with provider and RRT  Notify: Charge Nurse/RN  Name of Charge Nurse/RN Notified Alfredo Martinez, RN  Date Charge Nurse/RN Notified 05/19/20  Time Charge Nurse/RN Notified 2010  Notify: Provider  Provider Name/Title Allena Katz, MD  Date Provider Notified 05/19/20  Time Provider Notified 2225  Notification Type Call  Notification Reason Other (Comment) (Yellow MEWS score)  Response Other (Comment) (Monitor patient closely)  Date of Provider Response 05/19/20  Time of Provider Response 2225  Document  Patient Outcome Stabilized after interventions  Progress note created (see row info) Yes

## 2020-05-19 NOTE — Progress Notes (Signed)
Occupational Therapy Session Note  Patient Details  Name: Debra Schmidt MRN: [id number] Date of Birth: Sep 07, 1961  Today's Date: 05/19/2020 OT Individual Time: 1962-2297 OT Individual Time Calculation (min): 28 min    Short Term Goals: Week 2:  OT Short Term Goal 1 (Week 2): STG=LTG secondary to ELOS OT Short Term Goal 4 - Progress (Week 2): Progressing toward goal  Skilled Therapeutic Interventions/Progress Updates:    Treatment session with focus on simulated ADL retraining, carryover of back precautions, and activity endurance to promote increased ADL independence. Pt receive semi-reclined in bed reporting 7/10 pain in back. Pt reported she was not due for pain medication and she wanted to participate in therapy. Pt able to recall 2/3 precautions, remembering last precaution with cue to refer to sign in room. Pt completed supine to sit with modA due to intermittent pain spasms, with facilitation to maintain back precautions. Pt sat EOB while OTS donned lumbar corset total assistance. Pt sat EOB, completing simulated dressing tasks with theraband to target threading BLE through pants and pulling pants up over waist, completing with use of figure 4. Pt reported need for rest break, completed sit to supine with modA due to reported pain to advance legs on to bed. Ended session with pt semi-reclined in bed with all needs in reach and bed alarm on.  Therapy Documentation Precautions:  Precautions Precautions: Fall, Back Precaution Booklet Issued: No Precaution Comments:  (Reviewed back precautions) Required Braces or Orthoses: Spinal Brace Spinal Brace: Lumbar corset, Applied in sitting position Restrictions Weight Bearing Restrictions: No RUE Weight Bearing: Weight bearing as tolerated Other Position/Activity Restrictions: no overhead movement RUE Vital Signs: Therapy Vitals Temp: 97.8 F (36.6 C) Pulse Rate: 96 Resp: 16 BP: 112/80 Patient Position (if appropriate): Lying Oxygen  Therapy SpO2: 100 % O2 Device: Room Air Pain:  7/10 pain reported in back, respositioned  Therapy/Group: Individual Therapy  Blair Heys 05/19/2020, 3:07 PM

## 2020-05-19 NOTE — Progress Notes (Signed)
Physical Therapy Session Note  Patient Details  Name: Debra Schmidt MRN: [id number] Date of Birth: [birth year]  Today's Date: 05/19/2020 PT Individual Time: 7824-2353 PT Individual Time Calculation (min): 30 min   Short Term Goals: Week 1:  PT Short Term Goal 1 (Week 1): Pt will perform bed mobility with min A PT Short Term Goal 1 - Progress (Week 1): Progressing toward goal PT Short Term Goal 2 (Week 1): Pt will perform transfers with min A PT Short Term Goal 2 - Progress (Week 1): Progressing toward goal PT Short Term Goal 3 (Week 1): Pt will ambulate 50 feet with RW and min A PT Short Term Goal 3 - Progress (Week 1): Progressing toward goal PT Short Term Goal 4 (Week 1): Pt will initiate stair training PT Short Term Goal 4 - Progress (Week 1): Progressing toward goal Week 2:  PT Short Term Goal 1 (Week 2): STG = LTG d/t ELOS Week 3:     Skilled Therapeutic Interventions/Progress Updates:    PAIN 7/10 at rest, 9/10 back pain w/attempted sitting on side of bed  Pt initially supine and agreeable to treatment "if we try something easy".   Pt supine to L side w/min assist and cues for spinal precautions, very slow movement.  Pt remained in s/l extended time stating she needed to "get ready" and "avoid the pain grabbing me".   Attempted side to sit but pt yells "stop, stop, I can't" Attempted using bed controls to transition to sitting, progressed slowly w/elevation but only partial sitting achieved w/same result of pain.  Pt stayed in semi sitting and performed LAQs and heel raises x 15 each, constant cues to attend to task.   Returned to sidelying/supine w/max assist for LEs Supine therex including clamshells x 15, heel slides x 15, pelvic tilt x 5, transverse abd sets x 5 - pt states increased pain w/stabilization exercises. Stretching including single knee to chest w/hold at end range Gentle piriformis stretching w/increased tightness RvsL   Pt w/poor tolerance for this session due  to c/o back pain.  Somewhat hyperverbal and requires constant redirection/additional time to complete therex.    Pt left supine w/rails up x 4, alarm set, bed in lowest position, and needs in reach.   Therapy Documentation Precautions:  Precautions Precautions: Fall, Back Precaution Booklet Issued: No Precaution Comments:  (Reviewed back precautions) Required Braces or Orthoses: Spinal Brace Spinal Brace: Lumbar corset, Applied in sitting position Restrictions Weight Bearing Restrictions: No RUE Weight Bearing: Weight bearing as tolerated Other Position/Activity Restrictions: no overhead movement RUE    Therapy/Group: Individual Therapy  Rada Hay, PT   Shearon Balo 05/19/2020, 3:45 PM

## 2020-05-19 NOTE — Progress Notes (Signed)
PHARMACY CONSULT NOTE FOR:  OUTPATIENT  PARENTERAL ANTIBIOTIC THERAPY (OPAT)  Indication: MSSA bacteremia/epidural abscess Regimen: Cefazolin 2 gm IV q 8 hours End date: 06/03/20  IV antibiotic discharge orders are pended. To discharging provider:  please sign these orders via discharge navigator,  Select New Orders & click on the button choice - Manage This Unsigned Work.     Thank you for allowing pharmacy to be a part of this patient's care.  Sharin Mons, PharmD, BCPS, BCIDP Infectious Diseases Clinical Pharmacist Phone: [phone] 05/19/2020, 9:10 AM

## 2020-05-19 NOTE — Progress Notes (Signed)
[facility] PHYSICAL MEDICINE & REHABILITATION PROGRESS NOTE   Subjective/Complaints: Discussed with patient that treponema pallidus Ab was non-reactive and she was thrilled- called her husband right away. Pain is well controlled and much more lucid.   ROS: Pt denies SOB, abd pain, CP, N/V/C/D, and vision changes  Objective:   No results found. No results for input(s): WBC, HGB, HCT, PLT in the last 72 hours. Recent Labs    05/18/20 0511 05/19/20 0431  NA 134* 136  K 4.1 4.0  CL 98 101  CO2 27 25  GLUCOSE 116* 113*  BUN 9 8  CREATININE 0.52 0.48  CALCIUM 9.8 9.8    Intake/Output Summary (Last 24 hours) at 05/19/2020 1053 Last data filed at 05/19/2020 0750 Gross per 24 hour  Intake 470 ml  Output 2500 ml  Net -2030 ml     Physical Exam: Vital Signs Blood pressure 118/81, pulse 98, temperature 97.9 F (36.6 C), temperature source Oral, resp. rate 18, height 5' 4"  (1.626 m), weight 54.4 kg, SpO2 98 %. General: Alert and oriented x 3, No apparent distress HEENT: Head is normocephalic, atraumatic, PERRLA, EOMI, sclera anicteric, oral mucosa pink and moist, poor dentition  Neck: Muscles in upper traps and rhomboids tight still B/L Heart: Reg rate and rhythm. No murmurs rubs or gallops Chest: CTA bilaterally without wheezes, rales, or rhonchi; no distress Abdomen: Soft, non-tender, non-distended, bowel sounds positive. Extremities: No clubbing, cyanosis, or edema. Pulses are 2+ Skin: incision looks great- no drainage, no erythema- a little puffy, but good Neuro: Deltoids, biceps 4/5 B/L; Triceps, WE, grip and finger abd 4-/5 B/L LEs- HF 2+/5 on R; 2/5 on L; KE 2+/5, DF 2/5, and PF 3/5 B/L5/5.  Psych: Pt's affect is anxious secondary to pain  Assessment/Plan: 1. 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  Physiatrist is providing close team supervision and 24 hour management of active medical problems listed below.  Physiatrist and rehab team continue to assess barriers to discharge/monitor patient progress toward functional and medical goals  Care Tool:  Bathing    Body parts bathed by patient: Chest, Abdomen, Front perineal area, Right upper leg, Left upper leg, Face   Body parts bathed by helper: Buttocks, Right arm, Left arm, Right lower leg, Left lower leg     Bathing assist Assist Level: Moderate Assistance - Patient 50 - 74%     Upper Body Dressing/Undressing Upper body dressing   What is the patient wearing?: Hospital gown only    Upper body assist Assist Level: Maximal Assistance - Patient 25 - 49%    Lower Body Dressing/Undressing Lower body dressing      What is the patient wearing?: Pants     Lower body assist Assist for lower body dressing: Moderate Assistance - Patient 50 - 74%     Toileting Toileting    Toileting assist Assist for toileting: Maximal Assistance - Patient 25 - 49%     Transfers Chair/bed transfer  Transfers assist     Chair/bed transfer assist level: 2 Helpers     Locomotion Ambulation   Ambulation assist      Assist level: 2 helpers Assistive device: Walker-rolling Max distance: 54f   Walk 10 feet activity   ARaylevel: 2 helpers Assistive device: Walker-rolling   Walk 50 feet activity   Assist Walk 50  feet with 2 turns activity did not occur: Safety/medical concerns         Walk 150 feet activity   Assist Walk 150 feet activity did not occur: Safety/medical concerns         Walk 10 feet on uneven surface  activity   Assist Walk 10 feet on uneven surfaces activity did not occur: Safety/medical concerns         Wheelchair     Assist Will patient use wheelchair at discharge?:  (TBD)      Wheelchair assist level: Supervision/Verbal cueing Max wheelchair  distance: 30 feet    Wheelchair 50 feet with 2 turns activity    Assist            Wheelchair 150 feet activity     Assist          Blood pressure 118/81, pulse 98, temperature 97.9 F (36.6 C), temperature source Oral, resp. rate 18, height 5' 4"  (1.626 m), weight 54.4 kg, SpO2 98 %.  Medical Problem List and Plan: 1.  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  Antithrombotics: -DVT/anticoagulation:  Mechanical: Sequential compression devices, below knee Bilateral lower extremities   - Dopplers negative.              -antiplatelet therapy: N/A 3. Pain Management: OxyContin 20 mg bid with oxycodone prn. Continues to have a lot of pain due to back spasms--robaxin ineffective. Will change to flexeril 60m tid scheduled. Will decrease valium to 2 mg bid prn. - appeared that RN's not giving prns very often, so, will see what she needs  6/30- increase Valium to 5 mg BID and make scheduled; con't oxycontin but move to 6am and 6pm- and con't flexeril and oxy prn.q3 H prn  7/1: added Robaxin which she is asking for this morning.  7/2: Improved but still limiting therapy.. Schedule Tylenol.  7/3 sedated and confused, will back down on Valium to 238m- appears brighter and more alert today   7/5: appears more alert. Start Gabapentin 30071mID for left sided sciatic pain that is currently most severe to her  7/6: states that Gabapentin was helpful. Increase long-acting Oxycontin to 35m49m2H.   7/7: pain appears to be better controlled  7/8-7/9: pain stable despite the decrease in oxycontin Flector patch to paraspinal area  4. Mood: LCSW to follow for evaluation and support.              -antipsychotic agents:  N/A 5. Neuropsych: This patient is capable of making  decisions on her own behalf. 6. Skin/Wound Care: Monitor incisions for healing. Continue Juven to promote wound healing.  7. Fluids/Electrolytes/Nutrition: Monitor I/O. Check lytes in am.  8. Disseminated MSSA infection with osteomyelitis, epidural abscess and R septic shoulder: Cefepime X 6 weeks with weekly CBC/BMP/ESR/CRP-end date 06/03/20.  9. HTN: Monitor BP tid--on Norvasc and Lisinopril daily.   6/30- BP well controlled;  will monitor for orthostatic hypotension  7/1: Hypotensive. Stop Amlodipine.   7/2: Hypotensive: Stop Lisinopril.  Vitals:   05/18/20 1945 05/19/20 0458  BP: 107/74 118/81  Pulse: 91 98  Resp: 16 18  Temp: 97.7 F (36.5 C) 97.9 F (36.6 C)  SpO2: 99% 98%  Soft BPs  May  improve with reduction in valium  7/5: Bps continue to be soft. Stop Flomax.   7/6: Bps continue to be soft. Decrease Valium to 14m BID.   7/7: Bps continue to be soft and she continues to appear sedated. Decrease Valium to HS  7/8: Continues to be hypotensive but added back prn Valium HS as patient says this greatly helps with anxiety and ability to fall asleep.  10. Epidural abscess with diskitis: On IV antibiotics end date 06/03/20  11. Chronic pancreatitis: Abdominal symptoms have resolved. To follow with [facility] for Lap chole on outpatient basis.  12. Hypokalemia: Hypomagnesemia has resolved with supplement. Will continue to keep Kdur at 40 meq bid as tends to drop when decreased once a day. Will monitor lytes MWF for now.   6/30- K+ much better 3.8- will check frequently 13. Acute on chronic anemia: H/H stable. Recheck in am.  14. Neurogenic bladder/Urinary retention: Remove foley in am and start bladder training.  6/30- remove foley today  Requires I/O cath on flomax add low dose urecholine 174mTID - pt thinks "bladder is ok" 15. Neurogenic bowel- will see if she needs bowel program- is likely since BM just "fell out" this AM when transferring- no control of it.   6/30- see if needs Bowel  program  7/1: moving bowels regularly.  7/4 cont of bowel   7/5: Had BM this morning.  15. Impaired cognition: MRI ordered and shows subcortical lesions of unclear etiology: differential includes PML, infarct, glioma- neurology and ID consulted. Dr. KiLeonel Ramsayoes not recommend LP due to discitis but questioned subacute stroke. Dr. CoLinus Salmonsoes not feel septic emboli is cause of changes and recommends no change in antibiotic regimen. Neurology to evaluate.   7/7: Appreciate neurology eval. HIV ordered and nonreactive. 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 Will discuss with ID  7/8: Discussed diagnosis with patient. Confirmatory tests are pending. Left message with husband asking him to call me back- will discuss results with him then.   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 She was thrilled and called husband- they had both been understandably very worried about this.    LOS: 10 days A FACE TO FACE EVALUATION WAS PERFORMED  KrClide Deutscheraulkar 05/19/2020, 10:53 AM

## 2020-05-19 NOTE — Progress Notes (Signed)
She has a negative confirmatory test for syphilis making the initial RPR a false positive.  No indication for any treatment or LP.   HCV RNA pending.  If this is positive, we can consider treatment of this as an outpatient after her antibiotic treatment completion.   She has outpatient follow up with [place] on 7/21 and due to complete treatment on 7/24.  If she remains in rehab we can reevaluate her at that time for the need for continuation or stop antibiotics vs transition to oral therapy.   Will sign off Gardiner Barefoot, MD

## 2020-05-19 NOTE — Progress Notes (Signed)
Physical Therapy Session Note  Patient Details  Name: Debra Schmidt MRN: [id number] Date of Birth: 1961/07/16  Today's Date: 05/19/2020 PT Individual Time: 0815-0900 PT Individual Time Calculation (min): 45 min  PT Missed Time: 15 minutes Missed Time Reason: Toileting and RN care  Short Term Goals: Week 2:  PT Short Term Goal 1 (Week 2): STG = LTG d/t ELOS  Skilled Therapeutic Interventions/Progress Updates:  Pt received supine in bed after toileting with RN. Agreeable to therapy. Denies any pain at rest prior to start of therapy session. Supine to sit in bed with min A and verbal cues required for hand placement on bed rail. While seated at EOB, pt uses B/L hands to brace herself and demonstrates mild posterior lean with verbal cues required for upright posture. Denies any pain while seated at EOB. Lumbar corset donned with pt seated at EOB by therapist. Squat pivot transfer from EOB to W/C with min A. Pt begins to c/o mild LBP while seated in W/C so pillow was placed behind back for support with some relief of pain. Pain also addressed with positioning, frequent rest breaks, and distraction throughout remainder of session. Dependent W/C transfer from room to therapy gym for time conservation. In the parallel bars, performed sit to stand from W/C x4 reps with B/L knees blocked by therapist, pt holding onto both parallel bars, and min A. Pt was able to take 3-4 steps while standing in the parallel bars, but then began to c/o significant R hip pain so further activity in the parallel bars was deferred. Seated in the W/C, pt performed LAQs 2x5 each leg to strengthen LEs. Pt denies any pain while performing LAQs. Pt attempted to propel W/C using BUEs, but began to experience significant low back pain so further W/C activity was deferred. Dependent W/C transfer from therapy gym to room. Squat pivot transfer from W/C to EOB with min A. Lumbar corset doffed with pt seated at EOB by therapist. Sit to  supinein bedwithmod A d/t LE management.Pt left semi-reclined in bed with all needs in reach.Pt appeared lucid throughout today's session. Pt exhibited decreased pain with bed mobility and transfers this date, and demonstrated increased ability to participate throughout session. Pt missed 15 minutes of skilled therapy d/t toileting and RN care.  Therapy Documentation Precautions:  Precautions Precautions: Fall, Back Precaution Booklet Issued: No Precaution Comments:  (Reviewed back precautions) Required Braces or Orthoses: Spinal Brace Spinal Brace: Lumbar corset, Applied in sitting position Restrictions Weight Bearing Restrictions: No RUE Weight Bearing: Weight bearing as tolerated Other Position/Activity Restrictions: no overhead movement RUE  Therapy/Group: Individual Therapy  Murrell Redden, SPT 05/19/2020, 12:27 PM

## 2020-05-20 ENCOUNTER — Inpatient Hospital Stay (HOSPITAL_COMMUNITY): Payer: Self-pay | Admitting: Physical Therapy

## 2020-05-20 ENCOUNTER — Inpatient Hospital Stay (HOSPITAL_COMMUNITY): Payer: Self-pay | Admitting: Occupational Therapy

## 2020-05-20 DIAGNOSIS — D649 Anemia, unspecified: Secondary | ICD-10-CM

## 2020-05-20 DIAGNOSIS — Z8679 Personal history of other diseases of the circulatory system: Secondary | ICD-10-CM

## 2020-05-20 DIAGNOSIS — N319 Neuromuscular dysfunction of bladder, unspecified: Secondary | ICD-10-CM

## 2020-05-20 LAB — HCV RNA QUANT
HCV Quantitative Log: 5.316 log10 IU/mL (ref >1.70)
HCV Quantitative: 207000 IU/mL (ref >50)

## 2020-05-20 MED ORDER — SODIUM CHLORIDE 0.9 % IV SOLN
INTRAVENOUS | Status: DC | PRN
  Administered 2020-05-20 – 2020-05-24 (×2): 250 mL via INTRAVENOUS
  Administered 2020-05-28: 1000 mL via INTRAVENOUS

## 2020-05-20 MED ORDER — BETHANECHOL CHLORIDE 25 MG PO TABS
25.0000 mg | ORAL_TABLET | Freq: Three times a day (TID) | ORAL | Status: DC
  Administered 2020-05-20 – 2020-06-04 (×45): 25 mg via ORAL
  Filled 2020-05-20 (×46): qty 1

## 2020-05-20 NOTE — Progress Notes (Signed)
Physical Therapy Session Note  Patient Details  Name: Debra Schmidt MRN: [id number] Date of Birth: [birth year]  Today's Date: 05/20/2020 PT Individual Time: 5465-6812 PT Individual Time Calculation (min): 55 min   Short Term Goals: Week 2:  PT Short Term Goal 1 (Week 2): STG = LTG d/t ELOS  Skilled Therapeutic Interventions/Progress Updates: Pt presents sitting in w/c and reluctantly agreeable to therapy.  Per nursing, has received pain meds.  Pt required encouragement to participate, and she says "go easy".  Pt wheeled to gym and performed seated there ex w/ slow progression and constant need for re-direction to task.  Pt states seeing something run across the floor, nursing notified of same.  Pt performed calf raises, LAQ, abd/add 3 x 10 w/ re-direction as pt continues to talk about family coming to visit and forgetting to perform there ex.  Pt performed seated reaching,  building PVC plumbing task w/o trunk support, but only able to perform unilaterally and for short duration 2/2 c/o pain in LB.  Pt refused any standing activities or attempts at gait.  Pt returned to room and chair alarm donned w/ all needs in reach.  Spoke w/ nursing re: "hallucinations" and c/o pain.     Therapy Documentation Precautions:  Precautions Precautions: Fall, Back Precaution Booklet Issued: No Precaution Comments:  (Reviewed back precautions) Required Braces or Orthoses: Spinal Brace Spinal Brace: Lumbar corset, Applied in sitting position Restrictions Weight Bearing Restrictions: No RUE Weight Bearing: Weight bearing as tolerated Other Position/Activity Restrictions: no overhead movement RUE General:   Vital Signs:   Pain:8/10, w/ pain meds received prior to treatment. Pain Assessment Pain Scale: 0-10 Pain Score: 9  Pain Type: Acute pain Pain Location: Back Pain Orientation: Lower Pain Descriptors / Indicators: Shooting Pain Intervention(s): Medication (See eMAR) Mobility:      Therapy/Group: Individual Therapy  Lucio Edward 05/20/2020, 12:46 PM

## 2020-05-20 NOTE — Progress Notes (Signed)
Patient positioned to left side and digital stimulation done with small amount of mucus stool out. Peri care done.

## 2020-05-20 NOTE — Progress Notes (Signed)
Bowel program performed. Small hard stool passed, small volume, with dig stim. Then suppository placed.

## 2020-05-20 NOTE — Progress Notes (Signed)
Occupational Therapy Session Note  Patient Details  Name: Debra Schmidt MRN: [id number] Date of Birth: 04/15/1961  Today's Date: 05/20/2020 OT Individual Time: 5784-6962 OT Individual Time Calculation (min): 75 min    Short Term Goals: Week 2:  OT Short Term Goal 1 (Week 2): STG=LTG secondary to ELOS OT Short Term Goal 4 - Progress (Week 2): Progressing toward goal  Skilled Therapeutic Interventions/Progress Updates: Patient overall sessioned as follows:  - talked constantly and required reminders to work as she spoke.  She replied that she needs to take her time to work due to pain and that she needs to explain her situation and take her time.   So  This OT Practitioner concurred to work with patient priority and slower work pace. Patient stated she appreciated clinician working with her pace.  -periawashing attempting to circle sit= setup;   She stated she hurt too much to try to side ly and reach back to wash and stated she did not want clniician to do so either.  -bed mobility=extra time, slow and deliberate and stated she was guarding in order to not have too much pain.   Spine with head of bed elevated about 25 degrees (she stated not to lower the head because it hurt her body too much)  Supine to edge of bed=extra, extra time as she guarded and moderate assistance  Static sitting balance= poor and patient required both hands to support hold balance EOB.     Don corsett= total assist.   She stated she could not attempt to don it all  Stand pivot transfer bed to w/c= extra time and min A  Sitting balance and gentle reaching activities in chair but maintaining back precaustions= extra time and she stated she did not want to move as moving bilateral arms made her back hurt.   She stated reaching above head with both arms did not hurt.  Oral care= extra time to process the steps and locate items visually on sink.    She required encouragement to reach forward to obtain toothbrush  paste and to turn on water  Hair care= extra time due to complaints of "I am taking my time so my back does not hurt and I feel tired." and moderate assistance as patient stated her arms were betting to tired to brush and put on a hair band at cape of neck for pony tail.  Patient was assisted to mobilize her chair back to side of bell.   Alarm in place and call bell as well.   Continue OT Plan of care      Therapy Documentation Precautions:  Precautions Precautions: Fall, Back Precaution Booklet Issued: No Precaution Comments:  (Reviewed back precautions) Required Braces or Orthoses: Spinal Brace Spinal Brace: Lumbar corset, Applied in sitting position Restrictions Weight Bearing Restrictions: No RUE Weight Bearing: Weight bearing as tolerated Other Position/Activity Restrictions: no overhead movement RUE Pain: stated she could not get more pain medicine yet Pain Assessment Pain Scale: 0-10 Pain Score: 8  Pain Type: Acute pain Pain Location: Back Pain Orientation: Lower Pain Radiating Towards: legs Pain Descriptors / Indicators: Aching Pain Frequency: Constant Pain Onset: On-going Patients Stated Pain Goal: 2 Pain Intervention(s): Medication (See eMAR)   Therapy/Group: Individual Therapy  Bud Face [facility] 05/20/2020, 10:37 AM

## 2020-05-20 NOTE — Progress Notes (Signed)
PHYSICAL MEDICINE & REHABILITATION PROGRESS NOTE   Subjective/Complaints: Patient seen sitting up in bed this morning.  She states she slept well overnight.  She denies complaints.  ROS: Denies CP, SOB, N/V/D  Objective:   No results found. No results for input(s): WBC, HGB, HCT, PLT in the last 72 hours. Recent Labs    05/18/20 0511 05/19/20 0431  NA 134* 136  K 4.1 4.0  CL 98 101  CO2 27 25  GLUCOSE 116* 113*  BUN 9 8  CREATININE 0.52 0.48  CALCIUM 9.8 9.8    Intake/Output Summary (Last 24 hours) at 05/20/2020 1150 Last data filed at 05/20/2020 0630 Gross per 24 hour  Intake 354 ml  Output 2600 ml  Net -2246 ml     Physical Exam: Vital Signs Blood pressure 117/82, pulse 94, temperature 97.9 F (36.6 C), resp. rate 18, height 5' 4"  (1.626 m), weight 54.4 kg, SpO2 98 %. Constitutional: No distress . Vital signs reviewed. HENT: Normocephalic.  Atraumatic.   Eyes: EOMI. No discharge. Cardiovascular: No JVD. Respiratory: Normal effort.  No stridor. GI: Non-distended. Skin: Warm and dry.  Intact. Psych: Normal mood.  Normal behavior. Musc: No edema in extremities.  No tenderness in extremities. Neuro: Alert Motor: Bilateral upper extremities: 4+/5 proximal to distal  LEs- HF 2+/5 on R; 2/5 on L; KE 2+/5, DF 2/5, and PF 3/5 B/L5/5.   Assessment/Plan: 1. 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  Physiatrist is providing close team supervision and 24 hour management of active medical problems listed below.  Physiatrist and rehab team continue to assess barriers to discharge/monitor patient progress toward functional and medical goals  Care Tool:  Bathing    Body parts bathed by patient: Chest, Abdomen, Front perineal area, Right upper leg, Left upper leg, Face   Body parts  bathed by helper: Buttocks, Right arm, Left arm, Right lower leg, Left lower leg     Bathing assist Assist Level: Moderate Assistance - Patient 50 - 74%     Upper Body Dressing/Undressing Upper body dressing   What is the patient wearing?: Hospital gown only    Upper body assist Assist Level: Maximal Assistance - Patient 25 - 49%    Lower Body Dressing/Undressing Lower body dressing      What is the patient wearing?: Pants     Lower body assist Assist for lower body dressing: Moderate Assistance - Patient 50 - 74%     Toileting Toileting    Toileting assist Assist for toileting: Maximal Assistance - Patient 25 - 49%     Transfers Chair/bed transfer  Transfers assist     Chair/bed transfer assist level: 2 Helpers     Locomotion Ambulation   Ambulation assist      Assist level: 2 helpers Assistive device: Walker-rolling Max distance: 55f   Walk 10 feet activity   [place]: 2 helpers Assistive device: Walker-rolling   Walk 50 feet activity   Assist Walk 50 feet with 2 turns activity did not occur: Safety/medical concerns         Walk 150 feet activity   Assist Walk 150 feet activity did not occur: Safety/medical concerns         Walk 10 feet on uneven surface  activity   Assist Walk 10 feet on uneven surfaces activity did not  occur: Safety/medical concerns         Wheelchair     Assist Will patient use wheelchair at discharge?:  (TBD)      Wheelchair assist level: Supervision/Verbal cueing Max wheelchair distance: 30 feet    Wheelchair 50 feet with 2 turns activity    Assist            Wheelchair 150 feet activity     Assist          Blood pressure 117/82, pulse 94, temperature 97.9 F (36.6 C), resp. rate 18, height 5' 4"  (1.626 m), weight 54.4 kg, SpO2 98 %.  Medical Problem List and Plan: 1.  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  Antithrombotics: -DVT/anticoagulation:  Mechanical: Sequential compression devices, below knee Bilateral lower extremities   - Dopplers negative.              -antiplatelet therapy: N/A 3. Pain Management: OxyContin 20 mg bid with oxycodone prn.   Robaxin   Scheduled Tylenol.   Valium   Started Gabapentin 342m TID for left sided sciatic pain that is currently most severe to her  Increase long-acting Oxycontin to 281mq12H.   Flector patch to paraspinal area   Controlled with meds on 7/10 4. Mood: LCSW to follow for evaluation and support.              -antipsychotic agents:  N/A 5. Neuropsych: This patient is capable of making decisions on her own behalf. 6. Skin/Wound Care: Monitor incisions for healing. Continue Juven to promote wound healing.  7. Fluids/Electrolytes/Nutrition: Monitor I/O.  8. Disseminated MSSA infection with osteomyelitis, epidural abscess and R septic shoulder:  Cefepime X 6 weeks with weekly CBC/BMP/ESR/CRP-end date 06/03/20.  9. HTN: Monitor BP.   Stoped Amlodipine.   Stoped Lisinopril.  Vitals:   05/20/20 0009 05/20/20 0402  BP: 104/72 117/82  Pulse: 86 94  Resp: 18 18  Temp: 98 F (36.7 C) 97.9 F (36.6 C)  SpO2: 99% 98%   Stoped Flomax.   Controlled on 7/10 10. Epidural abscess with diskitis: On IV antibiotics end date 06/03/20  11. Chronic pancreatitis: Abdominal symptoms have resolved. To follow with [facility] for Lap chole on outpatient basis.  12. Hypokalemia: Hypomagnesemia has resolved with supplement. Will continue to keep Kdur at 40 meq bid as tends to drop when decreased once a day.   Potassium 4.0 on 7/9 13. Acute on chronic anemia: H/H stable.   Hemoglobin 8.5 on 7/5, labs ordered for Monday 14. Neurogenic bladder/Urinary retention:   low dose urecholine 1032mID, increased to 25 on 7/10 15. Neurogenic bowel-   Improving  15. Impaired cognition: MRI shows subcortical  lesions of unclear etiology: differential includes PML, infarct, glioma- neurology and ID consulted. Dr. KirLeonel Ramsayes not recommend LP due to discitis but questioned subacute stroke. Dr. ComLinus Salmonses not feel septic emboli is cause of changes and recommends no change in antibiotic regimen.   HIV ordered and nonreactive. 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 ID  Discussed nonreactive treponema pallidum test with patient.   LOS: 11 days A FACE TO FACE EVALUATION WAS PERFORMED  Cayton Cuevas AniLorie Phenix10/2021, 11:50 AM

## 2020-05-21 ENCOUNTER — Inpatient Hospital Stay (HOSPITAL_COMMUNITY): Payer: Self-pay | Admitting: Occupational Therapy

## 2020-05-21 ENCOUNTER — Inpatient Hospital Stay (HOSPITAL_COMMUNITY): Payer: Self-pay | Admitting: Physical Therapy

## 2020-05-21 DIAGNOSIS — M792 Neuralgia and neuritis, unspecified: Secondary | ICD-10-CM

## 2020-05-21 NOTE — Progress Notes (Signed)
PHYSICAL MEDICINE & REHABILITATION PROGRESS NOTE   Subjective/Complaints: Patient seen laying in bed this morning.  She states she slept well overnight.  She denies complaints.  ROS: Denies CP, SOB, N/V/D  Objective:   No results found. No results for input(s): WBC, HGB, HCT, PLT in the last 72 hours. Recent Labs    05/19/20 0431  NA 136  K 4.0  CL 101  CO2 25  GLUCOSE 113*  BUN 8  CREATININE 0.48  CALCIUM 9.8    Intake/Output Summary (Last 24 hours) at 05/21/2020 1551 Last data filed at 05/21/2020 1500 Gross per 24 hour  Intake 695.83 ml  Output 1950 ml  Net -1254.17 ml     Physical Exam: Vital Signs Blood pressure 103/81, pulse (!) 109, temperature 98.4 F (36.9 C), temperature source Oral, resp. rate 18, height _0  (1.626 m), weight 54.4 kg, SpO2 99 %. Constitutional: No distress . Vital signs reviewed. HENT: Normocephalic.  Atraumatic. Eyes: EOMI. No discharge. Cardiovascular: No JVD. Respiratory: Normal effort.  No stridor. GI: Non-distended. Skin: Warm and dry.  Intact. Psych: Normal mood.  Normal behavior. Musc: No edema in extremities.  No tenderness in extremities. Neuro: Alert Motor: Bilateral upper extremities: 4+/5 proximal to distal  Bilateral lower extremities: 4 -/5 proximal to distal   Assessment/Plan: 1. 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  Physiatrist is providing close team supervision and 24 hour management of active medical problems listed below.  Physiatrist and rehab team continue to assess barriers to discharge/monitor patient progress toward functional and medical goals  Care Tool:  Bathing    Body parts bathed by patient: Chest, Abdomen, Front perineal area, Right upper leg, Left upper leg, Face   Body parts bathed by helper: Buttocks,  Right arm, Left arm, Right lower leg, Left lower leg     Bathing assist Assist Level: Moderate Assistance - Patient 50 - 74%     Upper Body Dressing/Undressing Upper body dressing   What is the patient wearing?: Pull over shirt    Upper body assist Assist Level: Moderate Assistance - Patient 50 - 74%    Lower Body Dressing/Undressing Lower body dressing      What is the patient wearing?: Pants     Lower body assist Assist for lower body dressing: Moderate Assistance - Patient 50 - 74%     Toileting Toileting    Toileting assist Assist for toileting: Maximal Assistance - Patient 25 - 49%     Transfers Chair/bed transfer  Transfers assist     Chair/bed transfer assist level: 2 Helpers     Locomotion Ambulation   Ambulation assist      Assist level: 2 helpers Assistive device: Walker-rolling Max distance: 75f   Walk 10 feet activity   [place]: 2 helpers Assistive device: Walker-rolling   Walk 50 feet activity   Assist Walk 50 feet with 2 turns activity did not occur: Safety/medical concerns         Walk 150 feet activity   Assist Walk 150 feet activity did not occur: Safety/medical concerns         Walk 10 feet on uneven surface  activity   Assist Walk 10 feet on uneven surfaces activity did not occur: Safety/medical concerns         Wheelchair     Assist  Will patient use wheelchair at discharge?:  (TBD)      Wheelchair assist level: Supervision/Verbal cueing Max wheelchair distance: 30 feet    Wheelchair 50 feet with 2 turns activity    Assist            Wheelchair 150 feet activity     Assist          Blood pressure 103/81, pulse (!) 109, temperature 98.4 F (36.9 C), temperature source Oral, resp. rate 18, height _0  (1.626 m), weight 54.4 kg, SpO2 99 %.  Medical Problem List and Plan: 1.  Impaired functionsecondary to incomplete paraplegia due to osteomyelitis and epidural  abscess with neurogenic bowel and bladder  Continue CIR 15/7  2.  Antithrombotics: -DVT/anticoagulation:  Mechanical: Sequential compression devices, below knee Bilateral lower extremities   - Dopplers negative.              -antiplatelet therapy: N/A 3. Pain Management/neuropathic pain: OxyContin 20 mg bid with oxycodone prn.   Robaxin   Scheduled Tylenol.   Valium   Started Gabapentin 352m TID for left sided sciatic pain that is currently most severe to her  Increase long-acting Oxycontin to 218mq12H.   Flector patch to paraspinal area   Controlled with meds on 7/11 4. Mood: LCSW to follow for evaluation and support.              -antipsychotic agents:  N/A 5. Neuropsych: This patient is capable of making decisions on her own behalf. 6. Skin/Wound Care: Monitor incisions for healing. Continue Juven to promote wound healing.  7. Fluids/Electrolytes/Nutrition: Monitor I/O.  8. Disseminated MSSA infection with osteomyelitis, epidural abscess and R septic shoulder:  Cefepime X 6 weeks with weekly CBC/BMP/ESR/CRP-end date 06/03/20.  9. HTN: Monitor BP.   Stoped Amlodipine.   Stoped Lisinopril.  Vitals:   05/21/20 0333 05/21/20 1522  BP: 106/78 103/81  Pulse: 96 (!) 109  Resp: 16 18  Temp: 97.9 F (36.6 C) 98.4 F (36.9 C)  SpO2: 98% 99%   Stoped Flomax.   Controlled on 7/11 10. Epidural abscess with diskitis: On IV antibiotics end date 06/03/20  11. Chronic pancreatitis: Abdominal symptoms have resolved. To follow with [facility] for Lap chole on outpatient basis.  12. Hypokalemia: Hypomagnesemia has resolved with supplement. Will continue to keep Kdur at 40 meq bid as tends to drop when decreased once a day.   Potassium 4.0 on 7/9 13. Acute on chronic anemia: H/H stable.   Hemoglobin 8.5 on 7/5, labs ordered for tomorrow 14. Neurogenic bladder/Urinary retention:   low dose urecholine 1015mID, increased to 25 on 7/10  Continues to retain, consider further adjustments as  necessary 15. Neurogenic bowel-   Improving  15. 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 Dr. KirLeonel Ramsayes not recommend LP due to discitis but questioned subacute stroke. Dr. ComLinus Salmonses not feel septic emboli is cause of changes and recommends no change in antibiotic regimen.   HIV ordered and nonreactive. 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 ID  Discussed nonreactive treponema pallidum test with patient.   ?  Improving  LOS: 12 days A FACE TO FACE EVALUATION WAS PERFORMED  Kuzey Ogata AniLorie Phenix11/2021, 3:51 PM

## 2020-05-21 NOTE — Progress Notes (Addendum)
Occupational Therapy Session Note  Patient Details  Name: Debra Schmidt MRN: [id number] Date of Birth: [birth year]  Today's Date: 05/21/2020 OT Individual Time: 3151-7616 OT Individual Time Calculation (min): 72 min   Skilled Therapeutic Interventions/Progress Updates:    Pt greeted sitting on BSC with NT present, just finished transferring using RW. Donned her back brace at start of session. Pt able to complete hygiene in the front, Min A for standing balance while 2nd helper completed hygiene in the back. Min A of 1 for stand pivot<w/c after using RW. Pt threaded both LEs into pants with reacher, increased time, vcs and encouragement. CGA for sit<stand and Min A for standing balance while she attempted to assist OT with pulling pants over hips. Pt unable to assist with this aspect of LB dressing due to back pain. Mod A to doff button up shirt using overhead technique and the same assist required to don a new overhead shirt. Worked on increasing pts independence with doffing/donning back brace with vcs, specifically worked on her self managing the drawstrings. Total A for footwear. While sitting at the sink pt then completed hand hygiene. To work on UE strength and endurance, pt then assisted OT with doffing/donning pillowcases for her bed. Stand pivot<bed completed with Min A using RW. Pt then scooted herself up with supervision and OT doffed brace. Mod A to logroll back to supine. Pt remained in bed at end of session with bed alarm set and all needs within reach, awaiting NT for bladder scan. Tx focus placed on OOB tolerance, NMR, functional transfers, sit<stands, standing balance, and adaptive self care skills.   Note that pts most comfortable/pain minimizing sit<stand involved her keeping both hands on the RW handles during tx.    Therapy Documentation Precautions:  Precautions Precautions: Fall, Back Precaution Booklet Issued: No Precaution Comments:  (Reviewed back precautions) Required  Braces or Orthoses: Spinal Brace Spinal Brace: Lumbar corset, Applied in sitting position Restrictions Weight Bearing Restrictions: No RUE Weight Bearing: Weight bearing as tolerated Other Position/Activity Restrictions: no overhead movement RUE Pain: Pain Assessment Pain Scale: 0-10 Pain Score: 8  Pain Type: Acute pain Pain Location: Back Pain Orientation: Lower Pain Descriptors / Indicators: Discomfort Pain Frequency: Constant Pain Onset: On-going Patients Stated Pain Goal: 2 Pain Intervention(s): Repositioned ADL:        Therapy/Group: Individual Therapy  Ulices Maack A Alexi Geibel 05/21/2020, 12:39 PM

## 2020-05-21 NOTE — Progress Notes (Signed)
Physical Therapy Session Note  Patient Details  Name: Debra Schmidt MRN: [id number] Date of Birth: [birth year]  Today's Date: 05/21/2020 PT Individual Time: 1305-1400 PT Individual Time Calculation (min): 55 min   Short Term Goals: Week 2:  PT Short Term Goal 1 (Week 2): STG = LTG d/t ELOS  Skilled Therapeutic Interventions/Progress Updates: Pt presented in bed agreeable to therapy with encouragement. Pt indicates increased back pain after am therapy session and recently received pain meds but does not rate. Pt agreeable to attempt OOB activity but "will stop if the pain gets me" Pt hyperverbal and required redirection throughout session. Pt performed log roll to L with supervision an verbal cues to avoid rotating shoulders in "twisting" manner. Pt was able to drop legs but when attempted to push up through BUE pt with increased pain and stated did not want to get up. Pt was able to stay in leg dangled position for 2-3 min before being able to be re-directed to place legs back on the bed. Pt was able to perform scoot to [facility] with PTA bracing feet and pt pulling up using bed rails. Pt then performed rolling L/R to adjust/replace chuck pad. Pt indicated no increased pain with these activities. Pt then participated in [place] and [place] sets to initiate bridge without increase in pain. Pt repositioned to comfort at end of session and left with bed alarm on, call bell within reach and needs met.      Therapy Documentation Precautions:  Precautions Precautions: Fall, Back Precaution Booklet Issued: No Precaution Comments:  (Reviewed back precautions) Required Braces or Orthoses: Spinal Brace Spinal Brace: Lumbar corset, Applied in sitting position Restrictions Weight Bearing Restrictions: No RUE Weight Bearing: Weight bearing as tolerated Other Position/Activity Restrictions: no overhead movement RUE General:   Vital Signs: Therapy Vitals Temp: 98.4 F (36.9 C) Temp Source: Oral Pulse Rate:  (!) 109 Resp: 18 BP: 103/81 Patient Position (if appropriate): Lying Oxygen Therapy SpO2: 99 % O2 Device: Room Air Pain:   Mobility:   Locomotion :    Trunk/Postural Assessment :    Balance:   Exercises:   Other Treatments:      Therapy/Group: Individual Therapy  Deland Slocumb 05/21/2020, 4:23 PM

## 2020-05-22 ENCOUNTER — Inpatient Hospital Stay (HOSPITAL_COMMUNITY): Payer: Self-pay | Admitting: Physical Therapy

## 2020-05-22 ENCOUNTER — Inpatient Hospital Stay (HOSPITAL_COMMUNITY): Payer: Self-pay

## 2020-05-22 ENCOUNTER — Inpatient Hospital Stay (HOSPITAL_COMMUNITY): Payer: Self-pay | Admitting: Occupational Therapy

## 2020-05-22 LAB — CBC WITH DIFFERENTIAL/PLATELET
Abs Immature Granulocytes: 0.01 10*3/uL (ref 0.00–0.07)
Basophils Absolute: 0.1 10*3/uL (ref 0.0–0.1)
Basophils Relative: 1 %
Eosinophils Absolute: 0.4 10*3/uL (ref 0.0–0.5)
Eosinophils Relative: 10 %
HCT: 29.7 % — ABNORMAL LOW (ref 36.0–46.0)
Hemoglobin: 8.9 g/dL — ABNORMAL LOW (ref 12.0–15.0)
Immature Granulocytes: 0 %
Lymphocytes Relative: 30 %
Lymphs Abs: 1.3 10*3/uL (ref 0.7–4.0)
MCH: 26.4 pg (ref 26.0–34.0)
MCHC: 30 g/dL (ref 30.0–36.0)
MCV: 88.1 fL (ref 80.0–100.0)
Monocytes Absolute: 0.4 10*3/uL (ref 0.1–1.0)
Monocytes Relative: 9 %
Neutro Abs: 2.1 10*3/uL (ref 1.7–7.7)
Neutrophils Relative %: 50 %
Platelets: 335 10*3/uL (ref 150–400)
RBC: 3.37 MIL/uL — ABNORMAL LOW (ref 3.87–5.11)
RDW: 15.2 % (ref 11.5–15.5)
WBC: 4.3 10*3/uL (ref 4.0–10.5)
nRBC: 0 % (ref 0.0–0.2)

## 2020-05-22 LAB — BASIC METABOLIC PANEL
Anion gap: 9 (ref 5–15)
BUN: 10 mg/dL (ref 6–20)
CO2: 26 mmol/L (ref 22–32)
Calcium: 9.8 mg/dL (ref 8.9–10.3)
Chloride: 102 mmol/L (ref 98–111)
Creatinine, Ser: 0.65 mg/dL (ref 0.44–1.00)
GFR calc Af Amer: >60 mL/min (ref >60)
GFR calc non Af Amer: >60 mL/min (ref >60)
Glucose, Bld: 127 mg/dL — ABNORMAL HIGH (ref 70–99)
Potassium: 3.9 mmol/L (ref 3.5–5.1)
Sodium: 137 mmol/L (ref 135–145)

## 2020-05-22 LAB — SEDIMENTATION RATE: Sed Rate: >140 mm/hr — ABNORMAL HIGH (ref 0–22)

## 2020-05-22 LAB — C-REACTIVE PROTEIN: CRP: 2.2 mg/dL — ABNORMAL HIGH (ref <1.0)

## 2020-05-22 NOTE — Progress Notes (Signed)
Nurse educated patient on self catheterization. Patient taught sterile technique when inserting cath. Patient also taught how to use mirror while self cathing. Patient states an understanding but verbalizes she just needs more practice, nurse re assured patient she would get more practice before discharge. 550 ml out.

## 2020-05-22 NOTE — Progress Notes (Signed)
Physical Therapy Session Note  Patient Details  Name: Debra Schmidt MRN: [id number] Date of Birth: 08/17/1961  Today's Date: 05/22/2020 PT Individual Time: 0930-1000 PT Individual Time Calculation (min): 30 min   Short Term Goals: Week 2:  PT Short Term Goal 1 (Week 2): STG = LTG d/t ELOS  Skilled Therapeutic Interventions/Progress Updates:  Pt received supine in bed using bed pan. Agreeable to therapy. Denies any pain at rest prior to start of therapy session. Pt states she is feeling "good" today, and reports she would like to start performing gait and stairs in therapy sessions so that she can return home. Performed rolling R/L in bed with min A. Occasional verbal cues required for hand placement on bed rail. Pt continent of stool. Bed pan removed. Pt dependent for pericare, brief change, and clothing management at bed level. Supine to sit at EOB with min A. While seated at EOB, pt reports urinary urgency. Stand pivot transfer from EOB to [facility] with RW and mod A. Pt dependent for clothing management while standing in front of BSC with RW and mod A. Pt reports she is unable to void at this time. Attempted stand pivot transfer from [facility] to EOB with RW and mod A; however, while performing stand pivot transfer, pt's RLE "gave out" and pt experienced a near fall requiring A x 2 to safely return to sitting at EOB. Sit to supine in bed with min A. Pt left supine in bed with all needs in reach, resting comfortably. Pt appeared lucid throughout today's session. Pt exhibited decreased pain with bed mobility and transfers this date, and demonstrated increased motivation to participate throughout session.  Therapy Documentation Precautions:  Precautions Precautions: Fall, Back Precaution Booklet Issued: No Precaution Comments:  (Reviewed back precautions) Required Braces or Orthoses: Spinal Brace Spinal Brace: Lumbar corset, Applied in sitting position Restrictions Weight Bearing Restrictions: No RUE  Weight Bearing: Weight bearing as tolerated Other Position/Activity Restrictions: no overhead movement RUE   Therapy/Group: Individual Therapy  Murrell Redden, SPT 05/22/2020, 12:38 PM

## 2020-05-22 NOTE — Progress Notes (Signed)
Occupational Therapy Session Note  Patient Details  Name: Debra Schmidt MRN: [id number] Date of Birth: August 13, 1961  Today's Date: 05/22/2020 OT Individual Time: 3419-3790 OT Individual Time Calculation (min): 55 min    Short Term Goals: Week 2:  OT Short Term Goal 1 (Week 2): STG=LTG secondary to ELOS  Skilled Therapeutic Interventions/Progress Updates:    Pt resting in bed upon arrival.  Pt stated her pain was "manageable" when in supine with HOB elevated.  Pt agreeable to sitting EOB to wash up and change shirts.  Pt declined LB bathing and dressing this morning. Pt required mod verbal cues for attention to task this morning but easily redirected. Supine>sit EOB with HOB elevated with supervision using bed rails. Pt sat EOB for UB bathing/dressing.  Pt required min A for UB dressing tasks.  LSO donned and pt performed sit<>stand with min A. Standing balance with min A for ~30 seconds.  Pt discussed taking a shower when her husband brings in clean clothing. Pt agreed to taking shower tomorrow morning. Sit>supine in bed with supervision. Pt required min A for repositioning. Pt states she is looking forward to going home.  Reminded pt that she is currently not walking. Pt agrees but doesn't see that as a barrier. Pt with limited awareness of barriers and deficits.  Pt remained in bed with all needs within reach and bed alarm activated.   Therapy Documentation Precautions:  Precautions Precautions: Fall, Back Precaution Booklet Issued: No Precaution Comments:  (Reviewed back precautions) Required Braces or Orthoses: Spinal Brace Spinal Brace: Lumbar corset, Applied in sitting position Restrictions Weight Bearing Restrictions: No RUE Weight Bearing: Weight bearing as tolerated Other Position/Activity Restrictions: no overhead movement RUE Pain: Pain Assessment Pain Scale: 0-10 Pain Score: 5  Pain Type: Acute pain Pain Location: Back Pain Orientation: Lower Pain Descriptors /  Indicators: Discomfort Pain Frequency: Constant Pain Onset: On-going Patients Stated Pain Goal: 1 Pain Intervention(s):Meds admin prior to therapy, repositioned, emotional support   Therapy/Group: Individual Therapy  Rich Brave 05/22/2020, 8:58 AM

## 2020-05-22 NOTE — Plan of Care (Signed)
  Problem: Consults °Goal: RH SPINAL CORD INJURY PATIENT EDUCATION °Description:  See Patient Education module for education specifics.  °Outcome: Progressing °Goal: Skin Care Protocol Initiated - if Braden Score 18 or less °Description: If consults are not indicated, leave blank or document N/A °Outcome: Progressing °Goal: Nutrition Consult-if indicated °Outcome: Progressing °  °Problem: SCI BOWEL ELIMINATION °Goal: RH STG MANAGE BOWEL WITH ASSISTANCE °Description: STG Manage Bowel with max/total Assistance. °Outcome: Progressing °Goal: RH STG SCI MANAGE BOWEL WITH MEDICATION WITH ASSISTANCE °Description: STG SCI Manage bowel with medication with max/total assistance. °Outcome: Progressing °Goal: RH STG SCI MANAGE BOWEL PROGRAM W/ASSIST OR AS APPROPRIATE °Description: STG SCI Manage bowel program w/max/total assist or as appropriate. °Outcome: Progressing °  °Problem: SCI BLADDER ELIMINATION °Goal: RH STG MANAGE BLADDER WITH ASSISTANCE °Description: STG Manage Bladder With min Assistance °Outcome: Progressing °Goal: RH STG MANAGE BLADDER WITH MEDICATION WITH ASSISTANCE °Description: STG Manage Bladder With Medication With min Assistance. °Outcome: Progressing °  °Problem: RH SKIN INTEGRITY °Goal: RH STG SKIN FREE OF INFECTION/BREAKDOWN °Description: Skin to remain free from infection/breakdown while on rehab with min assist from staff. °Outcome: Progressing °Goal: RH STG MAINTAIN SKIN INTEGRITY WITH ASSISTANCE °Description: STG Maintain Skin Integrity With min Assistance. °Outcome: Progressing °Goal: RH STG ABLE TO PERFORM INCISION/WOUND CARE W/ASSISTANCE °Description: STG Able To Perform Incision/Wound Care With mod Assistance. °Outcome: Progressing °  °Problem: RH SAFETY °Goal: RH STG ADHERE TO SAFETY PRECAUTIONS W/ASSISTANCE/DEVICE °Description: STG Adhere to Safety Precautions With min Assistance and appropriate assistive Device. °Outcome: Progressing °  °Problem: RH PAIN MANAGEMENT °Goal: RH STG PAIN MANAGED  AT OR BELOW PT'S PAIN GOAL °Description: <4 on a 0-10 pain scale. °Outcome: Progressing °  °Problem: RH KNOWLEDGE DEFICIT SCI °Goal: RH STG INCREASE KNOWLEDGE OF SELF CARE AFTER SCI °Description: Patient will demonstrate knowledge of medication management, bladder program, bowel program, safety precautions, and follow up care with the MD post discharge with min assist from CIR staff. °Outcome: Progressing °  °

## 2020-05-22 NOTE — Progress Notes (Signed)
[place] PHYSICAL MEDICINE & REHABILITATION PROGRESS NOTE   S:  Pt reports still not urinating regularly- swelling of pubic area down some- wants to learn how to cath herself.  Can tell when needs to void, but unable to - feels "full".   Last cath was yesterday.      ROS:  Pt denies SOB, abd pain, CP, N/V/C/D, and vision changes  Objective:   No results found. Recent Labs    05/22/20 0407  WBC 4.3  HGB 8.9*  HCT 29.7*  PLT 335   Recent Labs    05/22/20 0407  NA 137  K 3.9  CL 102  CO2 26  GLUCOSE 127*  BUN 10  CREATININE 0.65  CALCIUM 9.8    Intake/Output Summary (Last 24 hours) at 05/22/2020 2010 Last data filed at 05/22/2020 1600 Gross per 24 hour  Intake 927.25 ml  Output 1725 ml  Net -797.75 ml     Physical Exam: Vital Signs Blood pressure 102/71, pulse (!) 102, temperature 97.8 F (36.6 C), resp. rate 16, height 5' 4"  (1.626 m), weight 54.4 kg, SpO2 97 %. Constitutional: No distress . Vital signs reviewed.laying in bed- appropriate this AM, NAD HENT: Normocephalic.  Atraumatic. Eyes:conjugate gaze Cardiovascular: no JVD. Respiratory: no accessory muscle use GI: Non-distended. Skin: Warm and dry.  Intact. Psych: appropriate Musc: No edema in extremities.  No tenderness in extremities. Neuro: Alert Motor: Bilateral upper extremities: 4+/5 proximal to distal  Bilateral lower extremities: 4 -/5 proximal to distal   Assessment/Plan: 1. 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  Physiatrist is providing close team supervision and 24 hour management of active medical problems listed below.  Physiatrist and rehab team continue to assess barriers to discharge/monitor patient progress toward functional and medical goals  Care Tool:  Bathing    Body parts bathed by patient:  Chest, Abdomen, Front perineal area, Right upper leg, Left upper leg, Face   Body parts bathed by helper: Buttocks, Right arm, Left arm, Right lower leg, Left lower leg     Bathing assist Assist Level: Moderate Assistance - Patient 50 - 74%     Upper Body Dressing/Undressing Upper body dressing   What is the patient wearing?: Pull over shirt    Upper body assist Assist Level: Moderate Assistance - Patient 50 - 74%    Lower Body Dressing/Undressing Lower body dressing      What is the patient wearing?: Pants     Lower body assist Assist for lower body dressing: Moderate Assistance - Patient 50 - 74%     Toileting Toileting    Toileting assist Assist for toileting: Maximal Assistance - Patient 25 - 49%     Transfers Chair/bed transfer  Transfers assist     Chair/bed transfer assist level: 2 Helpers     Locomotion Ambulation   Ambulation assist      Assist level: 2 helpers Assistive device: Walker-rolling Max distance: 98f   Walk 10 feet activity   APortagelevel: 2 helpers Assistive device: Walker-rolling   Walk 50 feet activity   Assist Walk 50 feet with 2 turns activity did not occur: Safety/medical concerns         Walk 150 feet activity   Assist Walk 150 feet activity did not occur: Safety/medical concerns         Walk  10 feet on uneven surface  activity   Assist Walk 10 feet on uneven surfaces activity did not occur: Safety/medical concerns         Wheelchair     Assist Will patient use wheelchair at discharge?:  (TBD)      Wheelchair assist level: Supervision/Verbal cueing Max wheelchair distance: 30 feet    Wheelchair 50 feet with 2 turns activity    Assist            Wheelchair 150 feet activity     Assist          Blood pressure 102/71, pulse (!) 102, temperature 97.8 F (36.6 C), resp. rate 16, height 5' 4"  (1.626 m), weight 54.4 kg, SpO2 97 %.  Medical Problem List and  Plan: 1.  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  Antithrombotics: -DVT/anticoagulation:  Mechanical: Sequential compression devices, below knee Bilateral lower extremities  7/12- need to check on Lovenox  - Dopplers negative.              -antiplatelet therapy: N/A 3. Pain Management/neuropathic pain: OxyContin 20 mg bid with oxycodone prn.   Robaxin   Scheduled Tylenol.   Valium   Started Gabapentin 336m TID for left sided sciatic pain that is currently most severe to her  Increase long-acting Oxycontin to 237mq12H.   Flector patch to paraspinal area   7/12- pain controlled- con't regimen 4. Mood: LCSW to follow for evaluation and support.              -antipsychotic agents:  N/A 5. Neuropsych: This patient is capable of making decisions on her own behalf. 6. Skin/Wound Care: Monitor incisions for healing. Continue Juven to promote wound healing.  7. Fluids/Electrolytes/Nutrition: Monitor I/O.  8. Disseminated MSSA infection with osteomyelitis, epidural abscess and R septic shoulder:  Cefepime X 6 weeks with weekly CBC/BMP/ESR/CRP-end date 06/03/20.  9. HTN: Monitor BP.   Stoped Amlodipine.   Stoped Lisinopril.  Vitals:   05/22/20 1523 05/22/20 1931  BP: 131/80 102/71  Pulse: (!) 102 (!) 102  Resp: 16   Temp: (!) 97 F (36.1 C) 97.8 F (36.6 C)  SpO2: 98% 97%   Stoped Flomax.   7/12- BP good once off flomax- can't restart 10. Epidural abscess with diskitis: On IV antibiotics end date 06/03/20  11. Chronic pancreatitis: Abdominal symptoms have resolved. To follow with [facility] for Lap chole on outpatient basis.  12. Hypokalemia: Hypomagnesemia has resolved with supplement. Will continue to keep Kdur at 40 meq bid as tends to drop when decreased once a day.   Potassium 4.0 on 7/9 13. Acute on chronic anemia: H/H stable.   Hemoglobin 8.5 on 7/5, labs ordered for  tomorrow 14. Neurogenic bladder/Urinary retention:   low dose urecholine 1027mID, increased to 25 on 7/10  Continues to retain, consider further adjustments as necessary  7/12- teach in/out caths- agree with pt.  15. Neurogenic bowel-   Improving  15. 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 Dr. KirLeonel Ramsayes not recommend LP due to discitis but questioned subacute stroke. Dr. ComLinus Salmonses not feel septic emboli is cause of changes and recommends no change in antibiotic regimen.   HIV ordered and nonreactive. 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 ID  Discussed nonreactive treponema pallidum test with patient.   7/12- pt able to  think of learning in/out caths on her own- thinks it's heading/improving?  LOS: 13 days A FACE TO FACE EVALUATION WAS PERFORMED  Debra Schmidt 05/22/2020, 8:10 PM

## 2020-05-22 NOTE — Progress Notes (Signed)
Occupational Therapy Session Note  Patient Details  Name: Debra Schmidt MRN: [id number] Date of Birth: 1961-04-06  Today's Date: 05/22/2020 OT Individual Time: 1015-1100 OT Individual Time Calculation (min): 45 min    Short Term Goals: Week 2:  OT Short Term Goal 1 (Week 2): STG=LTG secondary to ELOS OT Short Term Goal 4 - Progress (Week 2): Progressing toward goal  Skilled Therapeutic Interventions/Progress Updates:    Pt received in bed and agreeable to working on bed mobility and transfers.  Pt rolled to L side and sat to EOB with min A with cues for back precautions. Pt immediately has a spasm in R lower flank, after it did not calm down tried massaging it which seemed to help a little.  Donned brace on pt.  Pt was somewhat apprehensive about trying stand pivots with RW this morning and instead wanted to do a squat pivot. She completed squat pivot to wc with CGA. Pt completed oral care at sink.  Initially pt indicated that she wanted to sit up in wc but then after getting her completely set up with alarm, leg rests, etc pt stated she needed to lay down.   Squat pivot back to bed with CGA and then moved into sidelying with min A.   Pt adjusted in bed with all needs met.   Therapy Documentation Precautions:  Precautions Precautions: Fall, Back Precaution Booklet Issued: No Precaution Comments:  (Reviewed back precautions) Required Braces or Orthoses: Spinal Brace Spinal Brace: Lumbar corset, Applied in sitting position Restrictions Weight Bearing Restrictions: No RUE Weight Bearing: Weight bearing as tolerated Other Position/Activity Restrictions: no overhead movement RUE    Vital Signs: Therapy Vitals Temp: 98.3 F (36.8 C) Temp Source: Oral Pulse Rate: 100 Resp: 18 BP: 104/71 Patient Position (if appropriate): Lying Oxygen Therapy SpO2: 100 % O2 Device: Room Air Pain: Pain Assessment Pain Scale: 0-10 Pain Score: 5  Pain Type: Acute pain Pain Location: Back Pain  Orientation: Lower Pain Descriptors / Indicators: Discomfort Pain Frequency: Constant Pain Onset: On-going Patients Stated Pain Goal: 1 Pain Intervention(s): Medication (See eMAR)      Therapy/Group: Individual Therapy  [place] 05/22/2020, 8:25 AM

## 2020-05-22 NOTE — Progress Notes (Signed)
Patient ID: Debra Schmidt, female   DOB: 1960/11/17, 59 y.o.   MRN: [id number]  SW spoke with pt sister-in-lawLucy [phone]) who had questions related to patient's discharge. SW informed there will be follow-up after team conference tomorrow to provide more details on care needs. She states she is going to provide assistance as well when pt is d/c.   Cecile Sheerer, MSW, LCSWA Office: [phone] Cell: [phone] Fax: [phone]

## 2020-05-23 ENCOUNTER — Inpatient Hospital Stay (INDEPENDENT_AMBULATORY_CARE_PROVIDER_SITE_OTHER): Payer: Self-pay | Admitting: Primary Care

## 2020-05-23 ENCOUNTER — Inpatient Hospital Stay (HOSPITAL_COMMUNITY): Payer: Self-pay | Admitting: Physical Therapy

## 2020-05-23 ENCOUNTER — Inpatient Hospital Stay (HOSPITAL_COMMUNITY): Payer: Self-pay

## 2020-05-23 ENCOUNTER — Inpatient Hospital Stay (HOSPITAL_COMMUNITY): Payer: Self-pay | Admitting: Occupational Therapy

## 2020-05-23 MED ORDER — ENOXAPARIN SODIUM 40 MG/0.4ML ~~LOC~~ SOLN
40.0000 mg | SUBCUTANEOUS | Status: DC
  Administered 2020-05-23 – 2020-06-03 (×12): 40 mg via SUBCUTANEOUS
  Filled 2020-05-23 (×12): qty 0.4

## 2020-05-23 MED ORDER — ENOXAPARIN (LOVENOX) PATIENT EDUCATION KIT
PACK | Freq: Once | Status: AC
  Filled 2020-05-23: qty 1

## 2020-05-23 NOTE — Progress Notes (Signed)
Patient ID: Debra Schmidt, female   DOB: 1961/10/30, 59 y.o.   MRN: [id number]  SW met with pt in room to provide updates from team conference, and change in d/c date to 7/24.  SW called pt sister in law Debra Schmidt [phone]) to provide updates on above. She is aware pt should be finished with IVabx by date of d/c. SW informed pt currently I/O cath and SW will update if this will be required at d/c. SW informed will follow-up with her husband to discuss further. Reports the best contact number for his is [phone] which is patient's cellphone.   Loralee Pacas, MSW, [place] Office: [phone] Cell: [phone] Fax: [phone]

## 2020-05-23 NOTE — Plan of Care (Signed)
  Problem: Consults °Goal: RH SPINAL CORD INJURY PATIENT EDUCATION °Description:  See Patient Education module for education specifics.  °Outcome: Progressing °Goal: Skin Care Protocol Initiated - if Braden Score 18 or less °Description: If consults are not indicated, leave blank or document N/A °Outcome: Progressing °Goal: Nutrition Consult-if indicated °Outcome: Progressing °  °Problem: SCI BOWEL ELIMINATION °Goal: RH STG MANAGE BOWEL WITH ASSISTANCE °Description: STG Manage Bowel with max/total Assistance. °Outcome: Progressing °Goal: RH STG SCI MANAGE BOWEL WITH MEDICATION WITH ASSISTANCE °Description: STG SCI Manage bowel with medication with max/total assistance. °Outcome: Progressing °Goal: RH STG SCI MANAGE BOWEL PROGRAM W/ASSIST OR AS APPROPRIATE °Description: STG SCI Manage bowel program w/max/total assist or as appropriate. °Outcome: Progressing °  °Problem: SCI BLADDER ELIMINATION °Goal: RH STG MANAGE BLADDER WITH ASSISTANCE °Description: STG Manage Bladder With min Assistance °Outcome: Progressing °Goal: RH STG MANAGE BLADDER WITH MEDICATION WITH ASSISTANCE °Description: STG Manage Bladder With Medication With min Assistance. °Outcome: Progressing °  °Problem: RH SKIN INTEGRITY °Goal: RH STG SKIN FREE OF INFECTION/BREAKDOWN °Description: Skin to remain free from infection/breakdown while on rehab with min assist from staff. °Outcome: Progressing °Goal: RH STG MAINTAIN SKIN INTEGRITY WITH ASSISTANCE °Description: STG Maintain Skin Integrity With min Assistance. °Outcome: Progressing °Goal: RH STG ABLE TO PERFORM INCISION/WOUND CARE W/ASSISTANCE °Description: STG Able To Perform Incision/Wound Care With mod Assistance. °Outcome: Progressing °  °Problem: RH SAFETY °Goal: RH STG ADHERE TO SAFETY PRECAUTIONS W/ASSISTANCE/DEVICE °Description: STG Adhere to Safety Precautions With min Assistance and appropriate assistive Device. °Outcome: Progressing °  °Problem: RH PAIN MANAGEMENT °Goal: RH STG PAIN MANAGED  AT OR BELOW PT'S PAIN GOAL °Description: <4 on a 0-10 pain scale. °Outcome: Progressing °  °Problem: RH KNOWLEDGE DEFICIT SCI °Goal: RH STG INCREASE KNOWLEDGE OF SELF CARE AFTER SCI °Description: Patient will demonstrate knowledge of medication management, bladder program, bowel program, safety precautions, and follow up care with the MD post discharge with min assist from CIR staff. °Outcome: Progressing °  °

## 2020-05-23 NOTE — Patient Care Conference (Signed)
Inpatient RehabilitationTeam Conference and Plan of Care Update Date: 05/23/2020   Time: 3:27 PM    Patient Name: Debra Schmidt      Medical Record Number: [id number]  Date of Birth: 07-Apr-1961 Sex: Female         Room/Bed: 2V03J/0K93G-18 Payor Info: Payor: /    Admit Date/Time:  05/09/2020  4:12 PM  Primary Diagnosis:  Incomplete paraplegia [facility])  Hospital Problems: Principal Problem:   Incomplete paraplegia (HCC) Active Problems:   Abscess in epidural space of lumbar spine   Neurogenic bladder   Neurogenic bowel   Acute on chronic anemia   History of hypertension   Neuropathic pain    Expected Discharge Date: Expected Discharge Date: 06/03/20  Team Members Present: Physician leading conference: Dr. Genice Rouge Care Coodinator Present: Cecile Sheerer, LCSWA;Kaysa Roulhac Marlyne Beards, RN, BSN, CRRN Nurse Present: Mauro Kaufmann, LPN PT Present: Peter Congo, PT OT Present: Ardis Rowan, COTA;Jennifer Katrinka Blazing, OT SLP Present: Feliberto Gottron, SLP PPS Coordinator present : Edson Snowball, Park Breed, SLP     Current Status/Progress Goal Weekly Team Focus  Bowel/Bladder   Pt is having issues voiding but is continent with bowels. In/out cath q6 no void or with PVR's >350.  PT to void on her own.  Assess tolieting needs at least every 2 hours and cath per order if needed.   Swallow/Nutrition/ Hydration             ADL's   bed mobility-min A; functional transfers-min A/max A; UB bathing/dressing-min A; LB bathing/dressing-mod A/max A with multiple rest breaks; pain improving  supervsion overall  pain mgmt, activity tolerance, functional tranfsers, BADL retraining, safety awareness   Mobility   min A bed mobility, min to max A squat pivot, mod A to +2 stand pivot with RW, short distance amb in // bars  Supervision  pain management, therapy tolerance, participation   Communication             Safety/Cognition/ Behavioral Observations            Pain   Pt complains of lower  back pain, 8/10. Had scheduled tylenol and patches to back. Also has PRN's if needed.  To bring pain level below 2/10.  Assess pain q shift or prn.   Skin   Pt has scabbed area to left shoulder due to fall at home, surgical incision OTA on lower back.  To prevent skin breakdown and promote healing.  Assess skin q shift or prn.     Team Discussion:  Discharge Planning/Teaching Needs:  D/c to home with her husband who will provide intermittent support 9pm-6am; 17 y.o. son home during the day; sister in law [name] to provide assistance as well . Pt is uninsured and will require IVabx until 7/24.  Family education as recommended by therapy   Current Update:  none  Current Barriers to Discharge:  Incontinence  Possible Resolutions to Barriers: Patient is learning how to perform I&O cath's on herself. Nursing is doing the teaching.  Patient on target to meet rehab goals: yes, Pt has continent/incontinent episodes.   *See Care Plan and progress notes for long and short-term goals.   Revisions to Treatment Plan:  Changing back from 15/7 to regular schedule, MD beginning Lovenox for DVT prophylaxis. Nursing to begin teaching.    Medical Summary Current Status: uninsured- will arrange for IV ABX to finish here- can't void- is cathing- learning; bowel program currently- - pain is now controlled- Weekly Focus/Goal: PT- mod A- transfers;  Barriers  to Discharge: Decreased family/caregiver support;Home enviroment access/layout;Weight;Neurogenic Bowel & Bladder;IV antibiotics;Weight bearing restrictions  Barriers to Discharge Comments: d/c 7/24- extend- pain was limiting factor Possible Resolutions to Barriers: OT- shower today- encouraged now   Continued Need for Acute Rehabilitation Level of Care: The patient requires daily medical management by a physician with specialized training in physical medicine and rehabilitation for the following reasons: Direction of a multidisciplinary physical  rehabilitation program to maximize functional independence : Yes Medical management of patient stability for increased activity during participation in an intensive rehabilitation regime.: Yes Analysis of laboratory values and/or radiology reports with any subsequent need for medication adjustment and/or medical intervention. : Yes   I attest that I was present, lead the team conference, and concur with the assessment and plan of the team.   Tennis Must 05/23/2020, 3:27 PM

## 2020-05-23 NOTE — Progress Notes (Signed)
[place] PHYSICAL MEDICINE & REHABILITATION PROGRESS NOTE   S:  Pt reports learned to cath last night- was "pretty easy" but wants to practice- PT is asking for extension since didn't get to work much due to pain last week.   Pt reports pain doing much better overall. Explained about needing to start Lovenox due to increased risk of DVT.   ROS:   Pt denies SOB, abd pain, CP, N/V/C/D, and vision changes   Objective:   No results found. Recent Labs    05/22/20 0407  WBC 4.3  HGB 8.9*  HCT 29.7*  PLT 335   Recent Labs    05/22/20 0407  NA 137  K 3.9  CL 102  CO2 26  GLUCOSE 127*  BUN 10  CREATININE 0.65  CALCIUM 9.8    Intake/Output Summary (Last 24 hours) at 05/23/2020 1023 Last data filed at 05/23/2020 0620 Gross per 24 hour  Intake 220.53 ml  Output 1950 ml  Net -1729.47 ml     Physical Exam: Vital Signs Blood pressure 116/78, pulse 92, temperature 97.7 F (36.5 C), temperature source Oral, resp. rate 16, height 5' 4"  (1.626 m), weight 54.4 kg, SpO2 99 %. Constitutional: No distress . Vital signs reviewed.sitting up in manual w/c, in parallel bars, with PT, NAD HENT: Normocephalic.  Atraumatic. Eyes:conjugate gaze Cardiovascular: RRR Respiratory: CTA B/L- no W/R/R- good air movement GI: Soft, NT, ND, (+)BS  Skin: Warm and dry.  Intact. Psych: appropriate, vague Musc: No edema in extremities.  No tenderness in extremities. Neuro: Alert Motor: Bilateral upper extremities: 4+/5 proximal to distal  Bilateral lower extremities: 4 -/5 proximal to distal   Assessment/Plan: 1. 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  Physiatrist is providing close team supervision and 24 hour management of active medical problems listed below.  Physiatrist and rehab team continue to assess barriers  to discharge/monitor patient progress toward functional and medical goals  Care Tool:  Bathing    Body parts bathed by patient: Chest, Abdomen, Front perineal area, Right upper leg, Left upper leg, Face   Body parts bathed by helper: Buttocks, Right arm, Left arm, Right lower leg, Left lower leg     Bathing assist Assist Level: Moderate Assistance - Patient 50 - 74%     Upper Body Dressing/Undressing Upper body dressing   What is the patient wearing?: Pull over shirt    Upper body assist Assist Level: Moderate Assistance - Patient 50 - 74%    Lower Body Dressing/Undressing Lower body dressing      What is the patient wearing?: Pants     Lower body assist Assist for lower body dressing: Moderate Assistance - Patient 50 - 74%     Toileting Toileting    Toileting assist Assist for toileting: Maximal Assistance - Patient 25 - 49%     Transfers Chair/bed transfer  Transfers assist     Chair/bed transfer assist level: 2 Helpers     Locomotion Ambulation   Ambulation assist      Assist level: 2 helpers Assistive device: Walker-rolling Max distance: 51f   Walk 10 feet activity   ADaisylevel: 2 helpers Assistive device: Walker-rolling   Walk 50 feet activity   Assist Walk 50 feet with 2 turns activity did not occur: Safety/medical concerns  Walk 150 feet activity   Assist Walk 150 feet activity did not occur: Safety/medical concerns         Walk 10 feet on uneven surface  activity   Assist Walk 10 feet on uneven surfaces activity did not occur: Safety/medical concerns         Wheelchair     Assist Will patient use wheelchair at discharge?:  (TBD)      Wheelchair assist level: Supervision/Verbal cueing Max wheelchair distance: 30 feet    Wheelchair 50 feet with 2 turns activity    Assist            Wheelchair 150 feet activity     Assist          Blood pressure 116/78, pulse 92,  temperature 97.7 F (36.5 C), temperature source Oral, resp. rate 16, height 5' 4"  (1.626 m), weight 54.4 kg, SpO2 99 %.  Medical Problem List and Plan: 1.  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  Antithrombotics: -DVT/anticoagulation:  Mechanical: Sequential compression devices, below knee Bilateral lower extremities  7/12- need to check on Lovenox  7/13- will start lovneox- no contraindications- will need for 2 months from surgery  - Dopplers negative.              -antiplatelet therapy: N/A 3. Pain Management/neuropathic pain: OxyContin 20 mg bid with oxycodone prn.   Robaxin   Scheduled Tylenol.   Valium   Started Gabapentin 326m TID for left sided sciatic pain that is currently most severe to her  Increase long-acting Oxycontin to 247mq12H.   Flector patch to paraspinal area   7/13- pain doing better- con't regimen 4. Mood: LCSW to follow for evaluation and support.              -antipsychotic agents:  N/A 5. Neuropsych: This patient is capable of making decisions on her own behalf. 6. Skin/Wound Care: Monitor incisions for healing. Continue Juven to promote wound healing.  7. Fluids/Electrolytes/Nutrition: Monitor I/O.  8. Disseminated MSSA infection with osteomyelitis, epidural abscess and R septic shoulder:  Cefepime X 6 weeks with weekly CBC/BMP/ESR/CRP-end date 06/03/20.  9. HTN: Monitor BP.   Stoped Amlodipine.   Stoped Lisinopril.  Vitals:   05/22/20 1931 05/23/20 0525  BP: 102/71 116/78  Pulse: (!) 102 92  Resp:  16  Temp: 97.8 F (36.6 C) 97.7 F (36.5 C)  SpO2: 97% 99%   Stoped Flomax.   7/13- BP good- off flomax- - can't restart flomax 10. Epidural abscess with diskitis: On IV antibiotics end date 06/03/20  11. Chronic pancreatitis: Abdominal symptoms have resolved. To follow with [facility] for Lap chole on outpatient basis.  12. Hypokalemia:  Hypomagnesemia has resolved with supplement. Will continue to keep Kdur at 40 meq bid as tends to drop when decreased once a day.   Potassium 4.0 on 7/9 13. Acute on chronic anemia: H/H stable.   Hemoglobin 8.5 on 7/5, labs ordered for tomorrow 14. Neurogenic bladder/Urinary retention:   low dose urecholine 1062mID, increased to 25 on 7/10  Continues to retain, consider further adjustments as necessary  7/12- teach in/out caths- agree with pt.  7/13- started teaching of I/o caths- con'tteaching 15. Neurogenic bowel-   Improving  16. 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 Dr. KirLeonel Ramsayes not recommend LP due to discitis but questioned  subacute stroke. Dr. Linus Salmons does not feel septic emboli is cause of changes and recommends no change in antibiotic regimen.   HIV ordered and nonreactive. 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 ID  Discussed nonreactive treponema pallidum test with patient.   7/12- pt able to think of learning in/out caths on her own- thinks it's heading/improving? 17. Dispo  7/13- will try to extend pt to next week.   LOS: 14 days A FACE TO FACE EVALUATION WAS PERFORMED  Kristia Jupiter 05/23/2020, 10:23 AM

## 2020-05-23 NOTE — Progress Notes (Signed)
Physical Therapy Session Note  Patient Details  Name: Debra Schmidt MRN: [id number] Date of Birth: 1961/06/20  Today's Date: 05/23/2020 PT Individual Time: 9233-0076 PT Individual Time Calculation (min): 45 min   Short Term Goals: Week 2:  PT Short Term Goal 1 (Week 2): STG = LTG d/t ELOS  Skilled Therapeutic Interventions/Progress Updates:    Pt received seated in bed asleep, arousable and agreeable to PT session. Minimal complaints of pain this date with pain not a limiting factor in participation this session. Assisted pt with donning pants at bed level with min A for rolling R/L. Pt is min A for bed mobility and supine to sitting EOB via logroll. Assisted pt with donning lumbar corset while seated EOB. Squat pivot transfer to w/c with min A. Dependent transport via w/c to/from therapy gym for time conservation. Sit to stand in // bars with min A. Ambulation 3 x 5 ft in // bars with min A overall and close w/c follow for safety. Pt tends to stand in flexed hip and trunk posture with heavy reliance on BUE for standing balance. Pt does report instability in R knee in standing. Applied ACE wrap to R knee and pt reports improved stability during standing and gait tasks. Pt requests to return to bed at end of session. Squat pivot transfer back to bed with min A. Sit to supine min A. Pt left semi-reclined in bed with needs in reach, setup for breakfast. Pt continues to exhibit improved tolerance for therapy and participation in functional mobility tasks.  Therapy Documentation Precautions:  Precautions Precautions: Fall, Back Precaution Booklet Issued: No Precaution Comments:  (Reviewed back precautions) Required Braces or Orthoses: Spinal Brace Spinal Brace: Lumbar corset, Applied in sitting position Restrictions Weight Bearing Restrictions: No RUE Weight Bearing: Weight bearing as tolerated Other Position/Activity Restrictions: no overhead movement RUE    Therapy/Group: Individual  Therapy   Peter Congo, PT, DPT  05/23/2020, 12:21 PM

## 2020-05-23 NOTE — Progress Notes (Signed)
Occupational Therapy Session Note  Patient Details  Name: Debra Schmidt MRN: [id number] Date of Birth: [birth year]  Today's Date: 05/23/2020 OT Individual Time: 1330-1425 OT Individual Time Calculation (min): 55 min    Short Term Goals: Week 2:  OT Short Term Goal 1 (Week 2): STG=LTG secondary to ELOS OT Short Term Goal 4 - Progress (Week 2): Progressing toward goal  Skilled Therapeutic Interventions/Progress Updates:    Pt resting in bed upon arrival with IV running.  Pt initially wanted to take a shower when discussed yesterday but understands that a shower is not possible with IV running. Pt states that she thinks she might have had a bowel movement. Pt's brief was clean but pt stated she needed to get on [facility] because she could "feel it coming." Supine>sit EOB with min A.  Squat pivot transfer to [facility] with mod A. Pt remained seated on BSC and stated on serveral occasions that she thought she might have had a bowel movement because she could feel it coming down.  Pt with no bowel movement while seated. Pt started experiencing B hamstring cramps while seated and transferred back to EOB with mod A. Sit>supine with min A. Pt completed bed mobility to facilitate donning of brief.  Pt continued to state that she thought she had a bowel movement but no results noted.  Continued with discharge planning and discussion of LTG. Pt became tearful at one point.  Emotional support provided. Pt remained in bed with all needs within reach and bed alarm activated.   Therapy Documentation Precautions:  Precautions Precautions: Fall, Back Precaution Booklet Issued: No Precaution Comments:  (Reviewed back precautions) Required Braces or Orthoses: Spinal Brace Spinal Brace: Lumbar corset, Applied in sitting position Restrictions Weight Bearing Restrictions: No RUE Weight Bearing: Weight bearing as tolerated Other Position/Activity Restrictions: no overhead movement RUE Pain:  Pt c/o increased leg cramps  (hamstrings?) with knee and hip flexion seated on BSC and supine in bed; repositioned   Therapy/Group: Individual Therapy  Rich Brave 05/23/2020, 2:31 PM

## 2020-05-23 NOTE — Progress Notes (Signed)
Occupational Therapy Session Note  Patient Details  Name: Debra Schmidt MRN: [id number] Date of Birth: [birth year]  Today's Date: 05/23/2020 OT Individual Time: 1130-1200 OT Individual Time Calculation (min): 30 min    Short Term Goals: Week 1:  OT Short Term Goal 1 (Week 1): Pt will perform toileting with min A overall. OT Short Term Goal 1 - Progress (Week 1): Progressing toward goal OT Short Term Goal 2 (Week 1): Pt will perform LB dressing with use of AE or modifcations with min A. OT Short Term Goal 2 - Progress (Week 1): Progressing toward goal OT Short Term Goal 3 (Week 1): Pt perform toilet transfer with min A. OT Short Term Goal 3 - Progress (Week 1): Met OT Short Term Goal 4 (Week 1): Pt will stand for 2 minutes with min A or less for balance during self care tasks. OT Short Term Goal 4 - Progress (Week 1): Progressing toward goal Week 2:  OT Short Term Goal 1 (Week 2): STG=LTG secondary to ELOS OT Short Term Goal 4 - Progress (Week 2): Progressing toward goal     Skilled Therapeutic Interventions/Progress Updates:    Pt received in bed agreeable to therapy. Discussed her success with self cathing but how she still desires to toilet as normal. Pt stated she felt like she could urinate and wanted to try. Sat to EOB with min A, corset donned for her,  Used a squat pivot with min A to BSC. Sit to stand with min A to doff clothing over hips. Pt fearful of releasing one hand to push clothing down so therapist assisted. Pt sat but was unable to urinate.  Sit to stand to pull clothing up (therapist did so as pt did try to release one hand but felt she could not hold her balance).  Pt then took several step to bed and stepped to her L several times to sit down on bed close to [facility].  Min A to move back to sidelying then to supine. Pt set up with pillows and all needs met. Bed alarm set.  Pt tolerated session well.   Therapy Documentation Precautions:  Precautions Precautions: Fall,  Back Precaution Booklet Issued: No Precaution Comments:  (Reviewed back precautions) Required Braces or Orthoses: Spinal Brace Spinal Brace: Lumbar corset, Applied in sitting position Restrictions Weight Bearing Restrictions: No RUE Weight Bearing: Weight bearing as tolerated Other Position/Activity Restrictions: no overhead movement RUE    Vital Signs: Therapy Vitals Temp: 97.7 F (36.5 C) Temp Source: Oral Pulse Rate: 92 Resp: 16 BP: 116/78 Patient Position (if appropriate): Lying Oxygen Therapy SpO2: 99 % O2 Device: Room Air Pain: Pain Assessment Pain Scale: 0-10 Pain Score: 8  Pain Type: Acute pain Pain Location: Back Pain Orientation: Lower Pain Descriptors / Indicators: Discomfort Pain Frequency: Constant Pain Onset: On-going Patients Stated Pain Goal: 2 Pain Intervention(s): Medication (See eMAR) (scheduled oxy)  Therapy/Group: Individual Therapy  Joshua Tree 05/23/2020, 8:28 AM

## 2020-05-24 ENCOUNTER — Inpatient Hospital Stay (HOSPITAL_COMMUNITY): Payer: Self-pay | Admitting: Physical Therapy

## 2020-05-24 ENCOUNTER — Inpatient Hospital Stay (HOSPITAL_COMMUNITY): Payer: Self-pay

## 2020-05-24 ENCOUNTER — Inpatient Hospital Stay (HOSPITAL_COMMUNITY): Payer: Self-pay | Admitting: Occupational Therapy

## 2020-05-24 LAB — BASIC METABOLIC PANEL
Anion gap: 10 (ref 5–15)
BUN: 11 mg/dL (ref 6–20)
CO2: 27 mmol/L (ref 22–32)
Calcium: 10.1 mg/dL (ref 8.9–10.3)
Chloride: 100 mmol/L (ref 98–111)
Creatinine, Ser: 0.44 mg/dL (ref 0.44–1.00)
GFR calc Af Amer: >60 mL/min (ref >60)
GFR calc non Af Amer: >60 mL/min (ref >60)
Glucose, Bld: 100 mg/dL — ABNORMAL HIGH (ref 70–99)
Potassium: 4.2 mmol/L (ref 3.5–5.1)
Sodium: 137 mmol/L (ref 135–145)

## 2020-05-24 NOTE — Progress Notes (Signed)
Physical Therapy Session Note  Patient Details  Name: Debra Schmidt MRN: [id number] Date of Birth: [birth year]  Today's Date: 05/24/2020 PT Individual Time: 1347-1430 PT Individual Time Calculation (min): 43 min   Short Term Goals: Week 2:  PT Short Term Goal 1 (Week 2): STG = LTG d/t ELOS  Skilled Therapeutic Interventions/Progress Updates:     Patient in bed upon PT arrival. Patient alert and agreeable to PT session. Patient reported 6/10 L LE and back pain with spasms during session, RN made aware and provided pain medicine during session. PT provided repositioning, rest breaks, and distraction as pain interventions throughout session. Patient was verbose with tangential speech throughout session, required frequent redirection to maintain attention on therapeutic activities.  Therapeutic Activity: Bed Mobility: Patient performed supine to/from sit with mod-min A for trunk and LE control. Provided verbal cues for performing log rolling to maintain spinal precautions. PT donned LSO brace with patient sitting EOB and doffed with patient in supine with total A for time management and energy conservation. PT applied 4" ACE wraps to B knee for increased knee support with mobility due to patient report of buckling in standing.  Transfers: Patient performed lateral scoot transfers bed<>w/c with min A-CGA for initiation and balance. Provided cues for flexing at her hips with a straight back to maintain back precautions with mobility. She performed sit to/from stand x2 with min A-CGA in the // bars. Provided verbal cues for scooting forward and forward weight shift to stand and controlled descent to sit.  Gait Training:  Patient ambulated 4 feet or 6 steps on first trial and 2 steps on second trial using // bars with min A for support due to intermittent partial knee buckling/LE weakness. Ambulated with hip and knee flexion with small step length and height. Patient with increased pain/spasm in L LE  and anxiety due to fear of falling during gait training. Provided verbal cues for quad and gluteal activation on stance leg for reduced buckling and improved balance in SLS.  Wheelchair Mobility:  Patient was transported in the w/c with total A throughout session for energy conservation and time management.  Patient in bed at end of session with breaks locked, bed alarm set, and all needs within reach.    Therapy Documentation Precautions:  Precautions Precautions: Fall, Back Precaution Booklet Issued: No Precaution Comments:  (Reviewed back precautions) Required Braces or Orthoses: Spinal Brace Spinal Brace: Lumbar corset, Applied in sitting position Restrictions Weight Bearing Restrictions: No RUE Weight Bearing: Weight bearing as tolerated Other Position/Activity Restrictions: no overhead movement RUE   Therapy/Group: Individual Therapy  Tahra Hitzeman L Angelin Cutrone PT, DPT  05/24/2020, 8:43 PM

## 2020-05-24 NOTE — Progress Notes (Signed)
Physical Therapy Session Note  Patient Details  Name: Debra Schmidt MRN: [id number] Date of Birth: [birth year]  Today's Date: 05/24/2020 PT Individual Time: 0900-0945 PT Individual Time Calculation (min): 45 min   Short Term Goals: Week 2:  PT Short Term Goal 1 (Week 2): STG = LTG d/t ELOS  Skilled Therapeutic Interventions/Progress Updates:  Pt received semi-reclined in bed in room. Agreeable to therapy. States she is experiencing "a little" pain. Pain addressed with positioning and frequent rest breaks throughout session. Pt reports urinary urgency. Supine to sit in bed with min A. Verbal cues required for hand placement on bed rail. Squat pivot transfer EOB <> BSC with min A. Pt unable to void at this time. Dependent brief change and clothing management performed with pt standing in front of BSC with RW and min A. Lumbar corset donned with pt seated at EOB. From EOB, pt performed sit <> stand x4 reps with RW and min A to strengthen LEs and improve functional mobility. ACE wrap applied to R knee while performing sit to stand, and pt reports improved stability during standing. Lumbar corset doffed with pt seated at EOB. Sit to supine in bed with min A. Pt left semi-reclined in bed with all needs in reach. Long discussion with pt and Dr. Carlis Abbott at bedside regarding pt's concerns with extending length of stay as she is uninsured. Pt understands that it will be more difficult for her to continue abx therapy at home. Pt will ask her husband to come in this week for family education. Pt continues to exhibit improved tolerance for therapy and participation in functional mobility tasks.   Therapy Documentation Precautions:  Precautions Precautions: Fall, Back Precaution Booklet Issued: No Precaution Comments:  (Reviewed back precautions) Required Braces or Orthoses: Spinal Brace Spinal Brace: Lumbar corset, Applied in sitting position Restrictions Weight Bearing Restrictions: No RUE Weight  Bearing: Weight bearing as tolerated Other Position/Activity Restrictions: no overhead movement RUE  Therapy/Group: Individual Therapy  Murrell Redden, SPT 05/24/2020, 11:43 AM

## 2020-05-24 NOTE — Plan of Care (Signed)
°  Problem: Consults °Goal: RH SPINAL CORD INJURY PATIENT EDUCATION °Description:  See Patient Education module for education specifics.  °Outcome: Progressing °Goal: Skin Care Protocol Initiated - if Braden Score 18 or less °Description: If consults are not indicated, leave blank or document N/A °Outcome: Progressing °Goal: Nutrition Consult-if indicated °Outcome: Progressing °  °Problem: SCI BOWEL ELIMINATION °Goal: RH STG MANAGE BOWEL WITH ASSISTANCE °Description: STG Manage Bowel with max/total Assistance. °Outcome: Progressing °Goal: RH STG SCI MANAGE BOWEL WITH MEDICATION WITH ASSISTANCE °Description: STG SCI Manage bowel with medication with max/total assistance. °Outcome: Progressing °Goal: RH STG SCI MANAGE BOWEL PROGRAM W/ASSIST OR AS APPROPRIATE °Description: STG SCI Manage bowel program w/max/total assist or as appropriate. °Outcome: Progressing °  °Problem: SCI BLADDER ELIMINATION °Goal: RH STG MANAGE BLADDER WITH ASSISTANCE °Description: STG Manage Bladder With min Assistance °Outcome: Progressing °Goal: RH STG MANAGE BLADDER WITH MEDICATION WITH ASSISTANCE °Description: STG Manage Bladder With Medication With min Assistance. °Outcome: Progressing °  °Problem: RH SKIN INTEGRITY °Goal: RH STG SKIN FREE OF INFECTION/BREAKDOWN °Description: Skin to remain free from infection/breakdown while on rehab with min assist from staff. °Outcome: Progressing °Goal: RH STG MAINTAIN SKIN INTEGRITY WITH ASSISTANCE °Description: STG Maintain Skin Integrity With min Assistance. °Outcome: Progressing °Goal: RH STG ABLE TO PERFORM INCISION/WOUND CARE W/ASSISTANCE °Description: STG Able To Perform Incision/Wound Care With mod Assistance. °Outcome: Progressing °  °Problem: RH SAFETY °Goal: RH STG ADHERE TO SAFETY PRECAUTIONS W/ASSISTANCE/DEVICE °Description: STG Adhere to Safety Precautions With min Assistance and appropriate assistive Device. °Outcome: Progressing °  °Problem: RH PAIN MANAGEMENT °Goal: RH STG PAIN MANAGED  AT OR BELOW PT'S PAIN GOAL °Description: <4 on a 0-10 pain scale. °Outcome: Progressing °  °Problem: RH KNOWLEDGE DEFICIT SCI °Goal: RH STG INCREASE KNOWLEDGE OF SELF CARE AFTER SCI °Description: Patient will demonstrate knowledge of medication management, bladder program, bowel program, safety precautions, and follow up care with the MD post discharge with min assist from CIR staff. °Outcome: Progressing °  °

## 2020-05-24 NOTE — Progress Notes (Signed)
Occupational Therapy Session Note  Patient Details  Name: Debra Schmidt MRN: [id number] Date of Birth: [birth year]  Today's Date: 05/24/2020 OT Individual Time: 1130-1200 OT Individual Time Calculation (min): 30 min    Short Term Goals: Week 1:  OT Short Term Goal 1 (Week 1): Pt will perform toileting with min A overall. OT Short Term Goal 1 - Progress (Week 1): Progressing toward goal OT Short Term Goal 2 (Week 1): Pt will perform LB dressing with use of AE or modifcations with min A. OT Short Term Goal 2 - Progress (Week 1): Progressing toward goal OT Short Term Goal 3 (Week 1): Pt perform toilet transfer with min A. OT Short Term Goal 3 - Progress (Week 1): Met OT Short Term Goal 4 (Week 1): Pt will stand for 2 minutes with min A or less for balance during self care tasks. OT Short Term Goal 4 - Progress (Week 1): Progressing toward goal Week 2:  OT Short Term Goal 1 (Week 2): STG=LTG secondary to ELOS OT Short Term Goal 4 - Progress (Week 2): Progressing toward goal      Skilled Therapeutic Interventions/Progress Updates:    Pt seen this session to focus on bed mobility, standing balance. Pt worked on back precautions with rolling onto side and moving to sitting. Had a R flank back spasm. Applied her brace with min A and cues.  CGA with sit to stand to Rw.  Focused on side stepping to R 4x, L 4x and alternating forward toe taps with seated rest breaks when pt stated she needed to sit (which was after 1 min or less of standing).  Pt completed these exercises for 3 sets and then moved back to supine.  Pt resting in bed with all needs met.   Therapy Documentation Precautions:  Precautions Precautions: Fall, Back Precaution Booklet Issued: No Precaution Comments:  (Reviewed back precautions) Required Braces or Orthoses: Spinal Brace Spinal Brace: Lumbar corset, Applied in sitting position Restrictions Weight Bearing Restrictions: No RUE Weight Bearing: Weight bearing as  tolerated Other Position/Activity Restrictions: no overhead movement RUE  Pain: 8/10 back, premedicated     Therapy/Group: Individual Therapy  [place] 05/24/2020, 1:04 PM

## 2020-05-24 NOTE — Progress Notes (Signed)
[place] PHYSICAL MEDICINE & REHABILITATION PROGRESS NOTE   S: She is looking much better! She asks whether she may be discharged home over the weekend. Her reason is that she feels that her husband is under a lot of stress with her being in the hospital.   ROS:  Pt denies SOB, abd pain, CP, N/V/C/D, and vision changes   Objective:   No results found. Recent Labs    05/22/20 0407  WBC 4.3  HGB 8.9*  HCT 29.7*  PLT 335   Recent Labs    05/22/20 0407 05/24/20 0503  NA 137 137  K 3.9 4.2  CL 102 100  CO2 26 27  GLUCOSE 127* 100*  BUN 10 11  CREATININE 0.65 0.44  CALCIUM 9.8 10.1    Intake/Output Summary (Last 24 hours) at 05/24/2020 1324 Last data filed at 05/24/2020 1049 Gross per 24 hour  Intake 110 ml  Output 2900 ml  Net -2790 ml     Physical Exam: Vital Signs Blood pressure 117/81, pulse (!) 101, temperature 98.2 F (36.8 C), resp. rate 16, height 5' 4"  (1.626 m), weight 54.9 kg, SpO2 99 %. General: Alert and oriented x 3, No apparent distress HEENT: Head is normocephalic, atraumatic, PERRLA, EOMI, sclera anicteric, oral mucosa pink and moist, dentition intact, ext ear canals clear,  Neck: Supple without JVD or lymphadenopathy Heart: Reg rate and rhythm. No murmurs rubs or gallops Chest: CTA bilaterally without wheezes, rales, or rhonchi; no distress Abdomen: Soft, non-tender, non-distended, bowel sounds positive. Extremities: No clubbing, cyanosis, or edema. Pulses are 2+ Skin: Incision is clean dry and intact. Healing well.  Psych: appropriate, vague Musc: No edema in extremities.  No tenderness in extremities. Neuro: Alert Motor: Bilateral upper extremities: 4+/5 proximal to distal  Bilateral lower extremities: 4 -/5 proximal to distal     Assessment/Plan: 1. 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  Physiatrist is providing close team supervision and 24 hour management of active medical problems listed below.  Physiatrist and rehab team continue to assess barriers to discharge/monitor patient progress toward functional and medical goals  Care Tool:  Bathing    Body parts bathed by patient: Chest, Abdomen, Front perineal area, Right upper leg, Left upper leg, Face   Body parts bathed by helper: Buttocks, Right arm, Left arm, Right lower leg, Left lower leg     Bathing assist Assist Level: Moderate Assistance - Patient 50 - 74%     Upper Body Dressing/Undressing Upper body dressing   What is the patient wearing?: Pull over shirt    Upper body assist Assist Level: Moderate Assistance - Patient 50 - 74%    Lower Body Dressing/Undressing Lower body dressing      What is the patient wearing?: Pants     Lower body assist Assist for lower body dressing: Moderate Assistance - Patient 50 - 74%     Toileting Toileting    Toileting assist Assist for toileting: Maximal Assistance - Patient 25 - 49%     Transfers Chair/bed transfer  Transfers assist     Chair/bed transfer assist level: Minimal Assistance - Patient > 75%     Locomotion Ambulation   Ambulation assist      Assist level: 2 helpers Assistive device: Walker-rolling Max distance: 21f   Walk 10 feet activity   Assist     Assist  level: 2 helpers Assistive device: Walker-rolling   Walk 50 feet activity   Assist Walk 50 feet with 2 turns activity did not occur: Safety/medical concerns         Walk 150 feet activity   Assist Walk 150 feet activity did not occur: Safety/medical concerns         Walk 10 feet on uneven surface  activity   Assist Walk 10 feet on uneven surfaces activity did not occur: Safety/medical concerns         Wheelchair     Assist Will patient use wheelchair at discharge?:  (TBD)      Wheelchair assist  level: Supervision/Verbal cueing Max wheelchair distance: 30 feet    Wheelchair 50 feet with 2 turns activity    Assist            Wheelchair 150 feet activity     Assist          Blood pressure 117/81, pulse (!) 101, temperature 98.2 F (36.8 C), resp. rate 16, height 5' 4"  (1.626 m), weight 54.9 kg, SpO2 99 %.  Medical Problem List and Plan: 1.  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  Antithrombotics: -DVT/anticoagulation:  Mechanical: Sequential compression devices, below knee Bilateral lower extremities  7/12- need to check on Lovenox  7/13- will start lovneox- no contraindications- will need for 2 months from surgery  - Dopplers negative.              -antiplatelet therapy: N/A 3. Pain Management/neuropathic pain: OxyContin 20 mg bid with oxycodone prn.   Robaxin   Scheduled Tylenol.   Valium   Started Gabapentin 322m TID for left sided sciatic pain that is currently most severe to her  Increase long-acting Oxycontin to [id number].   Flector patch to paraspinal area   7/14: pain is well controlled 4. Mood: LCSW to follow for evaluation and support.              -antipsychotic agents:  N/A 5. Neuropsych: This patient is capable of making decisions on her own behalf. 6. Skin/Wound Care: Monitor incisions for healing. Continue Juven to promote wound healing.  7. Fluids/Electrolytes/Nutrition: Monitor I/O.  8. Disseminated MSSA infection with osteomyelitis, epidural abscess and R septic shoulder:  Cefepime X 6 weeks with weekly CBC/BMP/ESR/CRP-end date 06/03/20.  9. HTN: Monitor BP.   Stoped Amlodipine.   Stoped Lisinopril.  Vitals:   05/24/20 0437 05/24/20 1321  BP: (!) 110/94 117/81  Pulse: 100 (!) 101  Resp: 18 16  Temp: 98.6 F (37 C) 98.2 F (36.8 C)  SpO2: 100% 99%   Stoped Flomax.   7/13- BP good- off flomax- - can't restart  flomax  7/14: better controlled 10. Epidural abscess with diskitis: On IV antibiotics end date 06/03/20  11. Chronic pancreatitis: Abdominal symptoms have resolved. To follow with [facility] for Lap chole on outpatient basis.  12. Hypokalemia: Hypomagnesemia has resolved with supplement. Will continue to keep Kdur at 40 meq bid as tends to drop when decreased once a day.   Potassium 4.0 on 7/9 13. Acute on chronic anemia: H/H stable.   Hemoglobin 8.5 on 7/5, labs ordered for tomorrow 14. Neurogenic bladder/Urinary retention:   low dose urecholine 1016mID, increased to 25 on 7/10  Continues to retain, consider further adjustments as necessary  7/12- teach in/out caths- agree with pt.  7/14: continue I/O teaching 15. Neurogenic bowel-  Improving  16. 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 Dr. Leonel Ramsay does not recommend LP due to discitis but questioned subacute stroke. Dr. Linus Salmons does not feel septic emboli is cause of changes and recommends no change in antibiotic regimen.   HIV ordered and nonreactive. 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 ID  Discussed nonreactive treponema pallidum test with patient.   7/12- pt able to think of learning in/out caths on her own- thinks it's heading/improving? 17. Dispo  7/13- will try to extend pt to next week.   LOS: 15 days A FACE TO FACE EVALUATION WAS PERFORMED  Jolisa Intriago P Linda Biehn 05/24/2020, 1:24 PM

## 2020-05-24 NOTE — Progress Notes (Signed)
Occupational Therapy Session Note  Patient Details  Name: Debra Schmidt MRN: [id number] Date of Birth: 1961-04-26  Today's Date: 05/24/2020 OT Individual Time: 1535-1600 OT Individual Time Calculation (min): 25 min    Skilled Therapeutic Interventions/Progress Updates:    1;1. Pt received in bed agreeable to be level tx d/t "pulling in my back when I get up." Otherwise in supine with HOB slightly elevated pt feels no pain. Pt completes bed level LE therex to strengthen LEs in prep for functional transfers. Pt completes 2x10 SLR (AAROM-RLE), SAQ, ab/adduction, and heel slides. Pt verbose throughout session requiring VC for attention to task. Exited session with pt seated in bed, exit alarm on and call light in reach  Therapy Documentation Precautions:  Precautions Precautions: Fall, Back Precaution Booklet Issued: No Precaution Comments:  (Reviewed back precautions) Required Braces or Orthoses: Spinal Brace Spinal Brace: Lumbar corset, Applied in sitting position Restrictions Weight Bearing Restrictions: No RUE Weight Bearing: Weight bearing as tolerated Other Position/Activity Restrictions: no overhead movement RUE General:   Vital Signs: Therapy Vitals Temp: 98.2 F (36.8 C) Pulse Rate: (!) 101 Resp: 16 BP: 117/81 Patient Position (if appropriate): Lying Oxygen Therapy SpO2: 99 % O2 Device: Room Air Pain: Pain Assessment Pain Scale: 0-10 Pain Score: 0-No pain Faces Pain Scale: Hurts worst Pain Type: Acute pain;Surgical pain Pain Location: Back Pain Orientation: Lower ADL:   Vision   Perception    Praxis   Exercises:   Other Treatments:     Therapy/Group: Individual Therapy  Shon Hale 05/24/2020, 4:28 PM

## 2020-05-25 ENCOUNTER — Inpatient Hospital Stay (HOSPITAL_COMMUNITY): Payer: Self-pay

## 2020-05-25 ENCOUNTER — Inpatient Hospital Stay (HOSPITAL_COMMUNITY): Payer: Self-pay | Admitting: Occupational Therapy

## 2020-05-25 ENCOUNTER — Inpatient Hospital Stay (HOSPITAL_COMMUNITY): Payer: Self-pay | Admitting: Physical Therapy

## 2020-05-25 NOTE — Progress Notes (Signed)
Occupational Therapy Weekly Progress Note  Patient Details  Name: Debra Schmidt MRN: [id number] Date of Birth: 04/01/1961  Beginning of progress report period: May 18, 2020 End of progress report period: May 25, 2020  Pt has been limited by pain but has demonstrated improved participation over the past few days and is showing increased ability to perform BADLs and functional transfers.  Pt is mod A for bathing and dressing tasks and min A for squat pivot tranfsers to toilet and w/c.  Pt requires max A toileting tasks.  Pt requires multiple rest breaks as a result of increased pain when performing bathing/dressing tasks.  Pt is motivated to achieving LTGs and returning home at supervision level.  Patient continues to demonstrate the following deficits: muscle weakness and muscle joint tightness, decreased cardiorespiratoy endurance, abnormal tone, unbalanced muscle activation and decreased coordination, decreased problem solving, decreased memory and delayed processing and decreased standing balance, decreased postural control, decreased balance strategies and difficulty maintaining precautions and therefore will continue to benefit from skilled OT intervention to enhance overall performance with BADL.  Patient progressing toward long term goals..  Continue plan of care for most of her goals, but some have been downgraded from Supervision to Minimal A: Problem: RH Balance Goal: LTG Patient will maintain dynamic standing with ADLs (OT) Description: LTG:  Patient will maintain dynamic standing balance with assist during activities of daily living (OT)  Flowsheets (Taken 05/25/2020 1625) LTG: Pt will maintain dynamic standing balance during ADLs with: Minimal Assistance - Patient > 75% Note: LTG downgraded due to pain and spasms in LEs.   Problem: RH Dressing Goal: LTG Patient will perform lower body dressing w/assist (OT) Description: LTG: Patient will perform lower body dressing with assist,  with/without cues in positioning using equipment (OT) Flowsheets (Taken 05/25/2020 1625) LTG: Pt will perform lower body dressing with assistance level of: Minimal Assistance - Patient > 75% Note: LTG downgraded due to pain and spasms in LEs.   Problem: RH Toileting Goal: LTG Patient will perform toileting task (3/3 steps) with assistance level (OT) Description: LTG: Patient will perform toileting task (3/3 steps) with assistance level (OT)  Flowsheets (Taken 05/25/2020 1625) LTG: Pt will perform toileting task (3/3 steps) with assistance level: Minimal Assistance - Patient > 75% Note: LTG downgraded due to pain and spasms in LEs.   Problem: RH Toilet Transfers Goal: LTG Patient will perform toilet transfers w/assist (OT) Description: LTG: Patient will perform toilet transfers with assist, with/without cues using equipment (OT) Flowsheets (Taken 05/25/2020 1625) LTG: Pt will perform toilet transfers with assistance level of: Minimal Assistance - Patient > 75% Note: LTG downgraded due to pain and spasms in LEs.   Problem: RH Tub/Shower Transfers Goal: LTG Patient will perform tub/shower transfers w/assist (OT) Description: LTG: Patient will perform tub/shower transfers with assist, with/without cues using equipment (OT) Flowsheets (Taken 05/25/2020 1625) LTG: Pt will perform tub/shower stall transfers with assistance level of: Minimal Assistance - Patient > 75% Note: LTG downgraded due to pain and spasms in LEs.     OT Short Term Goals Week 2:  OT Short Term Goal 1 (Week 2): STG=LTG secondary to ELOS OT Short Term Goal 1 - Progress (Week 2): Progressing toward goal Week 3:  OT Short Term Goal 1 (Week 3): STG=LTG secondary to ELOS        Rich Brave 05/25/2020, 6:36 AM

## 2020-05-25 NOTE — Progress Notes (Signed)
Occupational Therapy Session Note  Patient Details  Name: JOLANTA CABEZA MRN: [id number] Date of Birth: 1961/10/10  Today's Date: 05/25/2020 OT Individual Time: 1130-1200 OT Individual Time Calculation (min): 30 min    Short Term Goals: Week 1:  OT Short Term Goal 1 (Week 1): Pt will perform toileting with min A overall. OT Short Term Goal 1 - Progress (Week 1): Progressing toward goal OT Short Term Goal 2 (Week 1): Pt will perform LB dressing with use of AE or modifcations with min A. OT Short Term Goal 2 - Progress (Week 1): Progressing toward goal OT Short Term Goal 3 (Week 1): Pt perform toilet transfer with min A. OT Short Term Goal 3 - Progress (Week 1): Met OT Short Term Goal 4 (Week 1): Pt will stand for 2 minutes with min A or less for balance during self care tasks. OT Short Term Goal 4 - Progress (Week 1): Progressing toward goal Week 2:  OT Short Term Goal 1 (Week 2): STG=LTG secondary to ELOS OT Short Term Goal 1 - Progress (Week 2): Progressing toward goal OT Short Term Goal 4 - Progress (Week 2): Progressing toward goal Week 3:  OT Short Term Goal 1 (Week 3): STG=LTG secondary to ELOS  Skilled Therapeutic Interventions/Progress Updates:    Pt seen this session to focus on bed mobility, standing balance. Pt worked on back precautions with rolling onto side and moving to sitting. Pt able to come to sitting with NO ASSIST needed. Pt applied her brace with verbal cues.    CGA with sit to stand to Rw.  Focused on side stepping R and L  and alternating forward toe taps with seated rest breaks when pt stated she needed to sit. Pt able to stand for 1. 5 minutes 1x and then 1 min or less of standing.  Pt completed these exercises for 3 sets and then moved back to supine with min A.  Pt resting in bed with all needs met.   Therapy Documentation Precautions:  Precautions Precautions: Fall, Back Precaution Booklet Issued: No Precaution Comments:  (Reviewed back  precautions) Required Braces or Orthoses: Spinal Brace Spinal Brace: Lumbar corset, Applied in sitting position Restrictions Weight Bearing Restrictions: No RUE Weight Bearing: Weight bearing as tolerated Other Position/Activity Restrictions: no overhead movement RUE    Pain: Pain Assessment Pain Scale: 0-10 Pain Score: 8  Pain Type: Acute pain Pain Location: Bladder Pain Intervention(s): Medication (See eMAR)   Therapy/Group: Individual Therapy  [place] 05/25/2020, 8:27 AM

## 2020-05-25 NOTE — Progress Notes (Signed)
Physical Therapy Weekly Progress Note  Patient Details  Name: Debra Schmidt MRN: [id number] Date of Birth: 1960/11/27  Beginning of progress report period: May 17, 2020 End of progress report period: May 25, 2020  Today's Date: 05/25/2020 PT Individual Time: 1400-1500 PT Individual Time Calculation (min): 60 min   STG not set d/t ELOS. Extended length of stay d/t slow progress and continued medical treatment. Pt is currently min A for bed mobility and min A for transfers. Pt has ambulated 6-8 steps in the parallel bars with both hands holding onto the bars and min A. Pt has not been able to continue gait training with RW since first week d/t pain. Have not yet initiated stair training. Pt unable to propel W/C d/t low back pain.  Pt demonstrates gradually increasing tolerance for functional activity as her pain is decreasing.  Patient continues to demonstrate the following deficits muscle weakness, decreased cardiorespiratoy endurance, decreased coordination and decreased motor planning and decreased sitting balance, decreased standing balance and decreased postural control and therefore will continue to benefit from skilled PT intervention to increase functional independence with mobility.  Patient progressing toward long term goals..  Continue plan of care.  PT Short Term Goals Week 2:  PT Short Term Goal 1 (Week 2): STG = LTG d/t ELOS PT Short Term Goal 1 - Progress (Week 2): Progressing toward goal Week 3:  PT Short Term Goal 1 (Week 3): STG = LTG d/t ELOS  Skilled Therapeutic Interventions/Progress Updates:  Pt received supine in bed in room. Agreeable to therapy. Denies any pain at rest prior to start of therapy session. ACE wraps in place to B/L knees for comfort. Supine to sit at EOB with min A. Verbal cues required for hand placement on bed rail. Squat pivot transfer from EOB to W/C with min A. Dependent W/C transfer from room to therapy gym for time conservation. In the parallel  bars, performed sit to stand from W/C x2 reps with CGA. Pt unable to take any steps while standing d/t B/L hamstring "cramps." With pt seated in W/C, performed passive stretching of B/L hamstrings 1 x 1 minute each side. In the parallel bars, performed sit to stand from W/C x1 rep with CGA. Pt still unable to take any steps while standing d/t B/L hamstring "cramps" so further activity in parallel bars deferred. Squat pivot transfer from W/C to mat table with min A. Pt performed sit <> stand with min A-CGA throughout remainder of session. Standing in front of mat table with RW, performed sidestepping R/L x10 feet each side with A x 2 with CGA provided by one person and RW managed by second person for safety. Pt reports urge to have bowel movement. Squat pivot transfer from mat table to W/C with min A. Dependent W/C transfer from therapy gym to room for time conservation. Squat pivot transfer from W/C to EOB with min A. Stand pivot transfer from EOB to [facility] with no device and min A. Pt dependent for clothing management standing in front of BSC. Pt unable to void at this time. Stand pivot transfer from [facility] to EOB with no device and min A. Sit to supine in bed with CGA. Pt reports urge to have bowel movement. Pt performs rolling R/L in bed with CGA. Pt dependent for clothing management at bed level. Bed pan placed at pt's request. Pt aware to contact RN when she has had BM, and not to stay on bed pan for an extended period of time.  Pt left supine in bed on bed pan. Call bell and all other needs in reach. All questions answered.  Therapy Documentation Precautions:  Precautions Precautions: Fall, Back Precaution Booklet Issued: No Precaution Comments:  (Reviewed back precautions) Required Braces or Orthoses: Spinal Brace Spinal Brace: Lumbar corset, Applied in sitting position Restrictions Weight Bearing Restrictions: No RUE Weight Bearing: Weight bearing as tolerated Other Position/Activity Restrictions: no  overhead movement RUE  Therapy/Group: Individual Therapy, SPT  Murrell Redden 05/25/2020, 4:22 PM

## 2020-05-25 NOTE — Progress Notes (Signed)
Physical Therapy Note  Patient Details  Name: [name] MRN: [id number] Date of Birth: May 01, 1961 Today's Date: 05/25/2020    After discussion with therapy and medical team patient to change back to regular therapy schedule as a progression from the 15/7 schedule due to improved tolerance and endurance for therapy sessions.    Peter Congo, PT, DPT  05/25/2020, 3:59 PM

## 2020-05-25 NOTE — Progress Notes (Signed)
Patient ID: Debra Schmidt, female   DOB: 11/06/1961, 59 y.o.   MRN: [id number]  SW received updates from medical team with reports on pt wanting to d/c this weekend.   SW met with pt and spoke with pt husband Debra Schmidt in room to discuss discharge. Pt is amenable to staying through current d/c date of 7/24 due to cost associated with IV abx, as well as possible catheters needed at discharge. SW discussed with pt husband family education next week. He will discuss with his sister Debra Schmidt and follow-up with SW.   SW spoke with pt sister in law Debra Schmidt to discuss above. She will follow-up about which dates they will come in for family education.   Loralee Pacas, MSW, [place] Office: [phone] Cell: [phone] Fax: [phone]

## 2020-05-25 NOTE — Plan of Care (Signed)
°  Problem: RH Balance Goal: LTG Patient will maintain dynamic standing with ADLs (OT) Description: LTG:  Patient will maintain dynamic standing balance with assist during activities of daily living (OT)  Flowsheets (Taken 05/25/2020 1625) LTG: Pt will maintain dynamic standing balance during ADLs with: Minimal Assistance - Patient > 75% Note: LTG downgraded due to pain and spasms in LEs.   Problem: RH Dressing Goal: LTG Patient will perform lower body dressing w/assist (OT) Description: LTG: Patient will perform lower body dressing with assist, with/without cues in positioning using equipment (OT) Flowsheets (Taken 05/25/2020 1625) LTG: Pt will perform lower body dressing with assistance level of: Minimal Assistance - Patient > 75% Note: LTG downgraded due to pain and spasms in LEs.   Problem: RH Toileting Goal: LTG Patient will perform toileting task (3/3 steps) with assistance level (OT) Description: LTG: Patient will perform toileting task (3/3 steps) with assistance level (OT)  Flowsheets (Taken 05/25/2020 1625) LTG: Pt will perform toileting task (3/3 steps) with assistance level: Minimal Assistance - Patient > 75% Note: LTG downgraded due to pain and spasms in LEs.   Problem: RH Toilet Transfers Goal: LTG Patient will perform toilet transfers w/assist (OT) Description: LTG: Patient will perform toilet transfers with assist, with/without cues using equipment (OT) Flowsheets (Taken 05/25/2020 1625) LTG: Pt will perform toilet transfers with assistance level of: Minimal Assistance - Patient > 75% Note: LTG downgraded due to pain and spasms in LEs.   Problem: RH Tub/Shower Transfers Goal: LTG Patient will perform tub/shower transfers w/assist (OT) Description: LTG: Patient will perform tub/shower transfers with assist, with/without cues using equipment (OT) Flowsheets (Taken 05/25/2020 1625) LTG: Pt will perform tub/shower stall transfers with assistance level of: Minimal Assistance -  Patient > 75% Note: LTG downgraded due to pain and spasms in LEs.

## 2020-05-25 NOTE — Progress Notes (Signed)
Occupational Therapy Session Note  Patient Details  Name: Debra Schmidt MRN: [id number] Date of Birth: October 22, 1961  Today's Date: 05/25/2020 OT Individual Time: 8295-6213 OT Individual Time Calculation (min): 70 min    Short Term Goals: Week 3:  OT Short Term Goal 1 (Week 3): STG=LTG secondary to ELOS  Skilled Therapeutic Interventions/Progress Updates:    Pt resting in bed upon arrival with CSW Auria present discussing discharge date and cost of services for antibiotics, RN care, etc if discharged prior to 7/24.  Pt verbalized understanding and agreeable to staying at hospital until 7/24. OT intervention with focus on bed mobility, sit<>stand, standing balance, functional transfers, toileting, discharge planning, and safety awareness to increase independence with BADLs. Pt requested use of BSC stating "I feel like it's getting ready to come out." Supine>sit EOB with min A. Stand pivot transfer with min A. Clothing management with tot A. Pt unable to void and stood with RW for clothing management. Stand pivot transfer with RW at min A/CGA. Pt performed sit<>stand X 4 from EOB with CGA to facilitate completing clothing management adequately. Sit>supine with min A to bring BLE onto bed.  Pt assisted with repositioning. Pt continued to talk about how she needs to return home because her husband is "struggling" at home without her. She stated that he works long hours during the day and has to take care of home when he returns from work.  Pt became tearful. Emotional support provided. Pt remained in bed with all needs within reach and bed alarm activated.   Therapy Documentation Precautions:  Precautions Precautions: Fall, Back Precaution Booklet Issued: No Precaution Comments:  (Reviewed back precautions) Required Braces or Orthoses: Spinal Brace Spinal Brace: Lumbar corset, Applied in sitting position Restrictions Weight Bearing Restrictions: No RUE Weight Bearing: Weight bearing as  tolerated Other Position/Activity Restrictions: no overhead movement RUE    Pain: Pt states her pain is better but she continues to get BLE "cramps" when sitting and with temperature changes; repositioned and emotional support.   Therapy/Group: Individual Therapy  Rich Brave 05/25/2020, 9:30 AM

## 2020-05-25 NOTE — Progress Notes (Signed)
[place] PHYSICAL MEDICINE & REHABILITATION PROGRESS NOTE   S: She is understanding that she will be staying until 7/24 when her IV antibiotics have been completed. She is tolerating therapy much better. Increase from 15/7 to regular. Discussed with her decreasing pain medications but she prefers to maintain at this time  ROS:  Pt denies SOB, abd pain, CP, N/V/C/D, and vision changes   Objective:   No results found. No results for input(s): WBC, HGB, HCT, PLT in the last 72 hours. Recent Labs    05/24/20 0503  NA 137  K 4.2  CL 100  CO2 27  GLUCOSE 100*  BUN 11  CREATININE 0.44  CALCIUM 10.1    Intake/Output Summary (Last 24 hours) at 05/25/2020 1243 Last data filed at 05/25/2020 1001 Gross per 24 hour  Intake 440 ml  Output 3300 ml  Net -2860 ml     Physical Exam: Vital Signs Blood pressure 107/75, pulse 99, temperature 98 F (36.7 C), resp. rate 14, height 5' 4"  (1.626 m), weight 56.9 kg, SpO2 98 %. General: Alert and oriented x 3, No apparent distress HEENT: Head is normocephalic, atraumatic, PERRLA, EOMI, sclera anicteric, oral mucosa pink and moist, dentition intact, ext ear canals clear,  Neck: Supple without JVD or lymphadenopathy Heart: Reg rate and rhythm. No murmurs rubs or gallops Chest: CTA bilaterally without wheezes, rales, or rhonchi; no distress Abdomen: Soft, non-tender, non-distended, bowel sounds positive. Extremities: No clubbing, cyanosis, or edema. Pulses are 2+ Skin: Incision is clean dry and intact. Healing well.  Psych: appropriate, vague Musc: No edema in extremities.  No tenderness in extremities. Neuro: Alert Motor: Bilateral upper extremities: 4+/5 proximal to distal  Bilateral lower extremities: 4 -/5 proximal to distal   Assessment/Plan: 1. 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  Physiatrist is providing close team supervision and 24 hour management of active medical problems listed below.  Physiatrist and rehab team continue to assess barriers to discharge/monitor patient progress toward functional and medical goals  Care Tool:  Bathing    Body parts bathed by patient: Chest, Abdomen, Front perineal area, Right upper leg, Left upper leg, Face   Body parts bathed by helper: Buttocks, Right arm, Left arm, Right lower leg, Left lower leg     Bathing assist Assist Level: Moderate Assistance - Patient 50 - 74%     Upper Body Dressing/Undressing Upper body dressing   What is the patient wearing?: Pull over shirt    Upper body assist Assist Level: Moderate Assistance - Patient 50 - 74%    Lower Body Dressing/Undressing Lower body dressing      What is the patient wearing?: Pants     Lower body assist Assist for lower body dressing: Moderate Assistance - Patient 50 - 74%     Toileting Toileting    Toileting assist Assist for toileting: Maximal Assistance - Patient 25 - 49%     Transfers Chair/bed transfer  Transfers assist     Chair/bed transfer assist level: Minimal Assistance - Patient > 75%     Locomotion Ambulation   Ambulation assist      Assist level: Minimal Assistance - Patient > 75% Assistive device: Parallel bars Max distance: 4 ft (6 steps)   Walk 10 feet activity   Assist     Assist level: 2 helpers Assistive device: [employer]  50 feet activity   Assist Walk 50 feet with 2 turns activity did not occur: Safety/medical concerns         Walk 150 feet activity   Assist Walk 150 feet activity did not occur: Safety/medical concerns         Walk 10 feet on uneven surface  activity   Assist Walk 10 feet on uneven surfaces activity did not occur: Safety/medical concerns         Wheelchair     Assist Will patient use wheelchair at discharge?:  (TBD)       Wheelchair assist level: Supervision/Verbal cueing Max wheelchair distance: 30 feet    Wheelchair 50 feet with 2 turns activity    Assist            Wheelchair 150 feet activity     Assist      Assist Level: Independent   Blood pressure 107/75, pulse 99, temperature 98 F (36.7 C), resp. rate 14, height 5' 4"  (1.626 m), weight 56.9 kg, SpO2 98 %.  Medical Problem List and Plan: 1.  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  Antithrombotics: -DVT/anticoagulation:  Mechanical: Sequential compression devices, below knee Bilateral lower extremities  7/15: Continue Lovenox 2 months from surgery  - Dopplers negative.              -antiplatelet therapy: N/A 3. Pain Management/neuropathic pain: OxyContin 20 mg bid with oxycodone prn. Well controlled  Robaxin   Scheduled Tylenol.   Valium   Started Gabapentin 334m TID for left sided sciatic pain that is currently most severe to her  Oxycontin to [id number].   Flector patch to paraspinal area  4. Mood: LCSW to follow for evaluation and support.              -antipsychotic agents:  N/A 5. Neuropsych: This patient is capable of making decisions on her own behalf. 6. Skin/Wound Care: Incision is healing well. Continue Juven to promote wound healing.  7. Fluids/Electrolytes/Nutrition: Monitor I/O.  8. Disseminated MSSA infection with osteomyelitis, epidural abscess and R septic shoulder:  Cefepime X 6 weeks with weekly CBC/BMP/ESR/CRP-end date 06/03/20.  9. HTN: Monitor BP.   Stoped Amlodipine.   Stoped Lisinopril.  Vitals:   05/24/20 1943 05/25/20 0351  BP: 105/80 107/75  Pulse: (!) 101 99  Resp: 18 14  Temp: 98.1 F (36.7 C) 98 F (36.7 C)  SpO2: 98% 98%   Stoped Flomax.   7/13- BP good- off flomax- - can't restart flomax  7/14: better controlled 10. Epidural abscess with diskitis: On IV antibiotics end date  06/03/20  11. Chronic pancreatitis: Abdominal symptoms have resolved. To follow with [facility] for Lap chole on outpatient basis.  12. Hypokalemia: Hypomagnesemia has resolved with supplement. Will continue to keep Kdur at 40 meq bid as tends to drop when decreased once a day.   Potassium 4.0 on 7/9 13. Acute on chronic anemia: H/H stable.   Hemoglobin 8.5 on 7/5, labs ordered for tomorrow 14. Neurogenic bladder/Urinary retention:   low dose urecholine 1093mID, increased to 25 on 7/10  Continues to retain, consider further adjustments as necessary  7/12- teach in/out caths- agree with pt.  7/14: continue I/O teaching 15. Neurogenic bowel-   Improving  16. 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 Dr. KirLeonel Ramsayes not recommend LP due to discitis  but questioned subacute stroke. Dr. Linus Salmons does not feel septic emboli is cause of changes and recommends no change in antibiotic regimen.   HIV ordered and nonreactive. 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 ID  Discussed nonreactive treponema pallidum test with patient.   7/12- pt able to think of learning in/out caths on her own- thinks it's heading/improving? 17. Dispo  7/13- will try to extend pt to next week.   LOS: 16 days A FACE TO FACE EVALUATION WAS PERFORMED  Clide Deutscher Luna Audia 05/25/2020, 12:43 PM

## 2020-05-26 ENCOUNTER — Inpatient Hospital Stay (HOSPITAL_COMMUNITY): Payer: Self-pay

## 2020-05-26 ENCOUNTER — Inpatient Hospital Stay (HOSPITAL_COMMUNITY): Payer: Self-pay | Admitting: Physical Therapy

## 2020-05-26 LAB — BASIC METABOLIC PANEL
Anion gap: 9 (ref 5–15)
BUN: 14 mg/dL (ref 6–20)
CO2: 22 mmol/L (ref 22–32)
Calcium: 8.8 mg/dL — ABNORMAL LOW (ref 8.9–10.3)
Chloride: 105 mmol/L (ref 98–111)
Creatinine, Ser: 0.52 mg/dL (ref 0.44–1.00)
GFR calc Af Amer: >60 mL/min (ref >60)
GFR calc non Af Amer: >60 mL/min (ref >60)
Glucose, Bld: 122 mg/dL — ABNORMAL HIGH (ref 70–99)
Potassium: 3.7 mmol/L (ref 3.5–5.1)
Sodium: 136 mmol/L (ref 135–145)

## 2020-05-26 MED ORDER — CALCIUM CITRATE 950 (200 CA) MG PO TABS
200.0000 mg | ORAL_TABLET | Freq: Every day | ORAL | Status: DC
  Administered 2020-05-26 – 2020-06-04 (×10): 200 mg via ORAL
  Filled 2020-05-26 (×10): qty 1

## 2020-05-26 NOTE — Progress Notes (Signed)
Occupational Therapy Note  Patient Details  Name: ALLEXIS BORDENAVE MRN: [id number] Date of Birth: [birth year]  Today's Date: 05/26/2020 OT Missed Time: 30 Minutes Missed Time Reason: Other (comment) (pt self cath)  Pt resting in bed upon arrival with RN present.  Pt requested catherization supplies from RN. Pt missed 30 mins skilled OT services.   Lavone Neri Annie Jeffrey [facility] 05/26/2020, 11:12 AM

## 2020-05-26 NOTE — Progress Notes (Signed)
Physical Therapy Session Note  Patient Details  Name: Debra Schmidt MRN: [id number] Date of Birth: 17-Nov-1960  Today's Date: 05/26/2020 PT Individual Time: 1452-1535 PT Individual Time Calculation (min): 43 min   Short Term Goals: Week 3:  PT Short Term Goal 1 (Week 3): STG = LTG d/t ELOS  Skilled Therapeutic Interventions/Progress Updates:    Attempted to see patient for scheduled therapy session, pt unavailable as she is having I/O cath performed by nursing. Pt missed 17 min of therapy session for nursing care. Pt agreeable to participate once I/O cathing completed. Pt is min A for bed mobility via logroll for supine to sit. Assisted pt with donning lumbar corset while seated EOB. Squat pivot transfer to w/c with min A. Dependent transport via w/c to/from therapy gym for time conservation. Squat pivot transfer w/c to/from Nustep with min A. Nustep level 1 x 5 min with use of BLE and LUE for global endurance training and forced use of LE musculature. Pt reports intermittent cramping in her BLE and low back throughout session that increases while performing Nustep. Further exercise on Nustep deferred. Assisted pt back to bed in similar manner as above. Sit to stand x 2 reps to RW with min A. Pt is able to take sidesteps to the L towards HOB with RW and min A. Assisted pt with doffing lumbar corset while seated EOB. Returned to supine with min A. Pt left semi-reclined in bed with needs in reach. RN notified at end of session that pt requesting muscle relaxer for spasms.  Therapy Documentation Precautions:  Precautions Precautions: Fall, Back Precaution Booklet Issued: No Precaution Comments:  (Reviewed back precautions) Required Braces or Orthoses: Spinal Brace Spinal Brace: Lumbar corset, Applied in sitting position Restrictions Weight Bearing Restrictions: No RUE Weight Bearing: Weight bearing as tolerated Other Position/Activity Restrictions: no overhead movement RUE General: PT Amount  of Missed Time (min): 17 Minutes PT Missed Treatment Reason: Nursing care    Therapy/Group: Individual Therapy   Peter Congo, PT, DPT  05/26/2020, 4:28 PM

## 2020-05-26 NOTE — Progress Notes (Signed)
[place] PHYSICAL MEDICINE & REHABILITATION PROGRESS NOTE   S: No complaints this morning. Pain has been well controlled. Tolerating therapy much better.  Performing I/I herself  ROS:  Pt denies SOB, abd pain, CP, N/V/C/D, and vision changes   Objective:   No results found. No results for input(s): WBC, HGB, HCT, PLT in the last 72 hours. Recent Labs    05/24/20 0503 05/26/20 0023  NA 137 136  K 4.2 3.7  CL 100 105  CO2 27 22  GLUCOSE 100* 122*  BUN 11 14  CREATININE 0.44 0.52  CALCIUM 10.1 8.8*    Intake/Output Summary (Last 24 hours) at 05/26/2020 1326 Last data filed at 05/26/2020 1225 Gross per 24 hour  Intake 380 ml  Output 2700 ml  Net -2320 ml    Physical Exam: Vital Signs Blood pressure 124/86, pulse 96, temperature 97.6 F (36.4 C), resp. rate 19, height 5' 4" (1.626 m), weight 56.4 kg, SpO2 97 %. General: Alert and oriented x 3, No apparent distress HEENT: Head is normocephalic, atraumatic, PERRLA, EOMI, sclera anicteric, oral mucosa pink and moist, dentition intact, ext ear canals clear,  Neck: Supple without JVD or lymphadenopathy Heart: Reg rate and rhythm. No murmurs rubs or gallops Chest: CTA bilaterally without wheezes, rales, or rhonchi; no distress Abdomen: Soft, non-tender, non-distended, bowel sounds positive. Extremities: No clubbing, cyanosis, or edema. Pulses are 2+ Skin: Incision is clean dry and intact. Healing well.  Psych: appropriate, vague Musc: No edema in extremities.  No tenderness in extremities. Neuro: Alert Motor: Bilateral upper extremities: 4+/5 proximal to distal  Bilateral lower extremities: 4 -/5 proximal to distal   Assessment/Plan: 1. 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  Physiatrist is providing close team supervision and 24 hour  management of active medical problems listed below.  Physiatrist and rehab team continue to assess barriers to discharge/monitor patient progress toward functional and medical goals  Care Tool:  Bathing    Body parts bathed by patient: Chest, Abdomen, Front perineal area, Right upper leg, Left upper leg, Face   Body parts bathed by helper: Buttocks, Right arm, Left arm, Right lower leg, Left lower leg     Bathing assist Assist Level: Moderate Assistance - Patient 50 - 74%     Upper Body Dressing/Undressing Upper body dressing   What is the patient wearing?: Button up shirt    Upper body assist Assist Level: Moderate Assistance - Patient 50 - 74%    Lower Body Dressing/Undressing Lower body dressing      What is the patient wearing?: Pants     Lower body assist Assist for lower body dressing: Minimal Assistance - Patient > 75%     Toileting Toileting    Toileting assist Assist for toileting: Maximal Assistance - Patient 25 - 49%     Transfers Chair/bed transfer  Transfers assist     Chair/bed transfer assist level: Minimal Assistance - Patient > 75%     Locomotion Ambulation   Ambulation assist      Assist level: Minimal Assistance - Patient > 75% Assistive device: Parallel bars Max distance: 4 ft (6 steps)   Walk 10 feet activity   Assist     Assist level: 2 helpers Assistive device: Walker-rolling   Walk 50 feet activity   Assist Walk 50 feet with 2 turns activity did not occur: Safety/medical  concerns         Walk 150 feet activity   Assist Walk 150 feet activity did not occur: Safety/medical concerns         Walk 10 feet on uneven surface  activity   Assist Walk 10 feet on uneven surfaces activity did not occur: Safety/medical concerns         Wheelchair     Assist Will patient use wheelchair at discharge?:  (TBD)      Wheelchair assist level: Supervision/Verbal cueing Max wheelchair distance: 30 feet     Wheelchair 50 feet with 2 turns activity    Assist            Wheelchair 150 feet activity     Assist      Assist Level: Independent   Blood pressure 124/86, pulse 96, temperature 97.6 F (36.4 C), resp. rate 19, height 5' 4" (1.626 m), weight 56.4 kg, SpO2 97 %.  Medical Problem List and Plan: 1.  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  Antithrombotics: -DVT/anticoagulation:  Mechanical: Sequential compression devices, below knee Bilateral lower extremities  7/15: Continue Lovenox 2 months from surgery  - Dopplers negative.              -antiplatelet therapy: N/A 3. Pain Management/neuropathic pain: OxyContin 20 mg bid with oxycodone prn. Well controlled  Robaxin   Scheduled Tylenol.   Valium   Started Gabapentin 372m TID for left sided sciatic pain that is currently most severe to her  Oxycontin to 231mq12H.   Flector patch to paraspinal area  4. Mood: LCSW to follow for evaluation and support.              -antipsychotic agents:  N/A 5. Neuropsych: This patient is capable of making decisions on her own behalf. 6. Skin/Wound Care: Incision is healing well. Continue Juven to promote wound healing.  7. Fluids/Electrolytes/Nutrition: Monitor I/O. Start calcium supplement for hypocalcemia. 7/16 BMP is otherwise stable.  8. Disseminated MSSA infection with osteomyelitis, epidural abscess and R septic shoulder:  Cefepime X 6 weeks with weekly CBC/BMP/ESR/CRP-end date 06/03/20.  9. HTN: Monitor BP.   Stoped Amlodipine.   Stoped Lisinopril.  Vitals:   05/25/20 1944 05/26/20 0418  BP: 109/86 124/86  Pulse: 99 96  Resp: 20 19  Temp: 98.8 F (37.1 C) 97.6 F (36.4 C)  SpO2: 99% 97%   Stoped Flomax.   7/13- BP good- off flomax- - can't restart flomax  7/14: better controlled 10. Epidural abscess with diskitis: On IV antibiotics end date 06/03/20  11.  Chronic pancreatitis: Abdominal symptoms have resolved. To follow with [facility] for Lap chole on outpatient basis.  12. Hypokalemia: Hypomagnesemia has resolved with supplement. Will continue to keep Kdur at 40 meq bid as tends to drop when decreased once a day.   Potassium 4.0 on 7/9 13. Acute on chronic anemia: H/H stable.   Hemoglobin 8.5 on 7/5, labs ordered for tomorrow 14. Neurogenic bladder/Urinary retention:   low dose urecholine 1049mID, increased to 25 on 7/10  Continues to retain, consider further adjustments as necessary  7/12- teach in/out caths- agree with pt.  7/14: continue I/O teaching 15. Neurogenic bowel-   Improving  16. 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 Dr. KirLeonel Ramsayes not recommend LP due to discitis but questioned subacute stroke. Dr. ComLinus Salmonses not  feel septic emboli is cause of changes and recommends no change in antibiotic regimen.   HIV ordered and nonreactive. 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 ID  Discussed nonreactive treponema pallidum test with patient.   7/12- pt able to think of learning in/out caths on her own- thinks it's heading/improving? 17. Dispo  7/13- will try to extend pt to next week.   LOS: 17 days A FACE TO FACE EVALUATION WAS PERFORMED  Martha Clan P Raulkar 05/26/2020, 1:26 PM

## 2020-05-26 NOTE — Progress Notes (Signed)
Occupational Therapy Session Note  Schmidt Details  Name: Debra Schmidt MRN: [id number] Date of Birth: August 07, 1961  Today's Date: 05/26/2020 OT Individual Time: 2836-6294 OT Individual Time Calculation (min): 70 min    Short Term Goals: Week 3:  OT Short Term Goal 1 (Week 3): STG=LTG secondary to ELOS  Skilled Therapeutic Interventions/Progress Updates:    Pt resting in bed upon arrival and ready to change clothing and get OOB. Pt requires ongoing emotional support and therapeutic listening throughout session and frequently becomes tearful. Supine>sit EOB with min A during power up from sidelying. Sit<>stand from EOB with CGA. Stand pivot transfer to w/c with CGA and min verbal cues for sequencing and safety awareness. Instructed pt to not sit down until she can feel chair against her legs.  Pt verbalized understanding but required verbal cues during subsequent sit<>stand. Pt required assistance fastening button up shirt.  Pt donned pants with sit<>stand. Pt completed grooming tasks with sit<>stand at sink and brief periods of standing at sink.  Pt relies on BUE support when sitting unsupported and standing with RW. Pt requires more then a reasonable amount of time to complete tasks and requires max verbal cues to redirect to task. Pt remained seated in w/c with belt alarm activated and all needs within request.   Therapy Documentation Precautions:  Precautions Precautions: Fall, Back Precaution Booklet Issued: No Precaution Comments:  (Reviewed back precautions) Required Braces or Orthoses: Spinal Brace Spinal Brace: Lumbar corset, Applied in sitting position Restrictions Weight Bearing Restrictions: No RUE Weight Bearing: Weight bearing as tolerated Other Position/Activity Restrictions: no overhead movement RUE    Pain:  Pt states her pain is "ok" this morning and her BLE cramps are better   Therapy/Group: Individual Therapy  Rich Brave 05/26/2020, 9:26 AM

## 2020-05-27 ENCOUNTER — Inpatient Hospital Stay (HOSPITAL_COMMUNITY): Payer: Self-pay

## 2020-05-27 NOTE — Progress Notes (Signed)
Physical Therapy Session Note 15 min missed treatment time due to nursing care  Patient Details  Name: Debra Schmidt MRN: [id number] Date of Birth: Aug 21, 1961  Today's Date: 05/27/2020 PT Individual Time:  -      Short Term Goals: Week 1:  PT Short Term Goal 1 (Week 1): Pt will perform bed mobility with min A PT Short Term Goal 1 - Progress (Week 1): Progressing toward goal PT Short Term Goal 2 (Week 1): Pt will perform transfers with min A PT Short Term Goal 2 - Progress (Week 1): Progressing toward goal PT Short Term Goal 3 (Week 1): Pt will ambulate 50 feet with RW and min A PT Short Term Goal 3 - Progress (Week 1): Progressing toward goal PT Short Term Goal 4 (Week 1): Pt will initiate stair training PT Short Term Goal 4 - Progress (Week 1): Progressing toward goal Week 2:  PT Short Term Goal 1 (Week 2): STG = LTG d/t ELOS PT Short Term Goal 1 - Progress (Week 2): Progressing toward goal Week 3:  PT Short Term Goal 1 (Week 3): STG = LTG d/t ELOS  Skilled Therapeutic Interventions/Progress Updates:    PAIN 8-9 left hip/back, nursing provided pain meds, rest breaks as needed, treatment to tolerance.  Pt w/nursing for Cath initially, missed 15 min treatment time.  Pt initially supine and agreeable to treatment.  Supine to side to sit w/min assist, additional time due to cramping type pain L hip.  LSO donned in sitting by therapist, cga for balance. STS w/RW w/cga, additional time. Returned to sitting due to pain.  Rested x 2 min then repeated and performed spt to wc w/rw w/min assist, additional time.  Pt transported to nurses station to request pain meds.  While awaiting meds pt performs STS from wc to RW x 2, stood x 1 min and performed lateral wt shifting, 30 sec and lateral wt shifting w/cga.  Rests in sitting due to pain.  Nursing provided pain meds.  Pt transported to gym for continued session. Worked on standing activities in parallel bars w/seated rest breaks every 45sec  to 1 min.  Worked on single step forward/back, single side step/alternating, progressed to 2 steps/forward/back, 3 steps forward/back.  Progression limited by "cramps"/pain.  Repeated total of 8X w/cga w/rest between.  Pt then performed hamstring and piriformis stretching/guided by therapist 2x each L/R.  At end of session, pt propelled wc back to room >146ft w/verbal cues for maintaining straight path, technique for turning wc.   Pt repositioned comfortably in wc.  Pt left oob in wc w/alarm belt set and needs in reach   Therapy Documentation Precautions:  Precautions Precautions: Fall, Back Precaution Booklet Issued: No Precaution Comments:  (Reviewed back precautions) Required Braces or Orthoses: Spinal Brace Spinal Brace: Lumbar corset, Applied in sitting position Restrictions Weight Bearing Restrictions: No RUE Weight Bearing: Weight bearing as tolerated Other Position/Activity Restrictions: no overhead movement RUE    Therapy/Group: Individual Therapy  Rada Hay, PT   Shearon Balo 05/27/2020, 7:44 AM

## 2020-05-27 NOTE — Progress Notes (Signed)
[place] PHYSICAL MEDICINE & REHABILITATION PROGRESS NOTE   S: No new complaints. Anxious to get home.   ROS: Patient denies fever, rash, sore throat, blurred vision, nausea, vomiting, diarrhea, cough, shortness of breath or chest pain, joint or back pain, headache, or mood change.   Objective:   No results found. No results for input(s): WBC, HGB, HCT, PLT in the last 72 hours. Recent Labs    05/26/20 0023  NA 136  K 3.7  CL 105  CO2 22  GLUCOSE 122*  BUN 14  CREATININE 0.52  CALCIUM 8.8*    Intake/Output Summary (Last 24 hours) at 05/27/2020 1146 Last data filed at 05/27/2020 0917 Gross per 24 hour  Intake 355 ml  Output 2225 ml  Net -1870 ml    Physical Exam: Vital Signs Blood pressure 110/82, pulse (!) 106, temperature 97.8 F (36.6 C), temperature source Oral, resp. rate 18, height 5' 4"  (1.626 m), weight 56 kg, SpO2 97 %. Constitutional: No distress . Vital signs reviewed. Frail appearing HEENT: EOMI, oral membranes moist Neck: supple Cardiovascular: RRR without murmur. No JVD    Respiratory/Chest: CTA Bilaterally without wheezes or rales. Normal effort    GI/Abdomen: BS +, non-tender, non-distended Ext: no clubbing, cyanosis, or edema Psych: pleasant and cooperative Skin: Incision is clean dry and intact. Healing well.  Psych: appropriate, vague Musc: No edema in extremities.  No tenderness in extremities. Neuro: Alert Motor: Bilateral upper extremities: 4+/5 proximal to distal  Bilateral lower extremities: 4 -/5 proximal to distal   Assessment/Plan: 1. 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  Physiatrist is providing close team supervision and 24 hour management of active medical problems listed below.  Physiatrist and rehab team continue to assess barriers to discharge/monitor  patient progress toward functional and medical goals  Care Tool:  Bathing    Body parts bathed by patient: Chest, Abdomen, Front perineal area, Right upper leg, Left upper leg, Face   Body parts bathed by helper: Buttocks, Right arm, Left arm, Right lower leg, Left lower leg     Bathing assist Assist Level: Moderate Assistance - Patient 50 - 74%     Upper Body Dressing/Undressing Upper body dressing   What is the patient wearing?: Button up shirt    Upper body assist Assist Level: Moderate Assistance - Patient 50 - 74%    Lower Body Dressing/Undressing Lower body dressing      What is the patient wearing?: Pants     Lower body assist Assist for lower body dressing: Minimal Assistance - Patient > 75%     Toileting Toileting    Toileting assist Assist for toileting: Maximal Assistance - Patient 25 - 49%     Transfers Chair/bed transfer  Transfers assist     Chair/bed transfer assist level: Minimal Assistance - Patient > 75%     Locomotion Ambulation   Ambulation assist      Assist level: Minimal Assistance - Patient > 75% Assistive device: Parallel bars Max distance: 4 ft (6 steps)   Walk 10 feet activity   Assist     Assist level: 2 helpers Assistive device: Walker-rolling   Walk 50 feet activity   Assist Walk 50 feet with 2 turns activity did not occur: Safety/medical concerns         Walk 150 feet activity   Assist Walk 150 feet activity  did not occur: Safety/medical concerns         Walk 10 feet on uneven surface  activity   Assist Walk 10 feet on uneven surfaces activity did not occur: Safety/medical concerns         Wheelchair     Assist Will patient use wheelchair at discharge?:  (TBD)      Wheelchair assist level: Supervision/Verbal cueing Max wheelchair distance: 30 feet    Wheelchair 50 feet with 2 turns activity    Assist            Wheelchair 150 feet activity     Assist       Assist Level: Independent   Blood pressure 110/82, pulse (!) 106, temperature 97.8 F (36.6 C), temperature source Oral, resp. rate 18, height 5' 4"  (1.626 m), weight 56 kg, SpO2 97 %.  Medical Problem List and Plan: 1.  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  Antithrombotics: -DVT/anticoagulation:  Mechanical: Sequential compression devices, below knee Bilateral lower extremities  7/15: Continue Lovenox 2 months from surgery  - Dopplers negative.              -antiplatelet therapy: N/A 3. Pain Management/neuropathic pain: OxyContin 20 mg bid with oxycodone prn. Well controlled  Robaxin   Scheduled Tylenol.   Valium   Started Gabapentin 368m TID for left sided sciatic pain that is currently most severe to her  Oxycontin to 242mq12H.   Flector patch to paraspinal area  4. Mood: LCSW to follow for evaluation and support.              -antipsychotic agents:  N/A 5. Neuropsych: This patient is capable of making decisions on her own behalf. 6. Skin/Wound Care: Incision is healing well. Continue Juven to promote wound healing.  7. Fluids/Electrolytes/Nutrition: Monitor I/O. Start calcium supplement for hypocalcemia. 7/16 BMP is otherwise stable.  8. Disseminated MSSA infection with osteomyelitis, epidural abscess and R septic shoulder:  Cefepime X 6 weeks with weekly CBC/BMP/ESR/CRP-end date 06/03/20.  9. HTN: Monitor BP.   Stoped Amlodipine.   Stoped Lisinopril.  Vitals:   05/26/20 2024 05/27/20 0517  BP: 117/80 110/82  Pulse: 99 (!) 106  Resp: 18 18  Temp: 97.6 F (36.4 C) 97.8 F (36.6 C)  SpO2: 100% 97%   Stoped Flomax.   7/13- BP good- off flomax- - can't restart flomax  7/17 controlled 10. Epidural abscess with diskitis: On IV antibiotics end date 06/03/20  11. Chronic  pancreatitis: Abdominal symptoms have resolved. To follow with [facility] for Lap chole on outpatient basis.  12. Hypokalemia: Hypomagnesemia has resolved with supplement. Will continue to keep Kdur at 40 meq bid as tends to drop when decreased once a day.   Potassium 4.0 on 7/9 13. Acute on chronic anemia: H/H stable.   Hemoglobin 8.9 on 7/14 14. Neurogenic bladder/Urinary retention:   low dose urecholine 1017mID, increased to 25 on 7/10  Continues to retain, consider further adjustments as necessary  7/12- teach in/out caths- agree with pt.  7/14-17: continue I/O teaching, still retaining urine 15. Neurogenic bowel-   Improving  16. 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 Dr. KirLeonel Ramsayes not recommend LP due to discitis but questioned subacute stroke. Dr. ComLinus Salmonses not feel septic emboli  is cause of changes and recommends no change in antibiotic regimen.   HIV ordered and nonreactive. 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 ID  Discussed nonreactive treponema pallidum test with patient.   7/12- pt able to think of learning in/out caths on her own- thinks it's heading/improving? 17. Dispo  7/13-   extend pt to next week.   LOS: 18 days A FACE TO FACE EVALUATION WAS PERFORMED  Meredith Staggers 05/27/2020, 11:46 AM

## 2020-05-27 NOTE — Plan of Care (Signed)
  Problem: Consults °Goal: RH SPINAL CORD INJURY PATIENT EDUCATION °Description:  See Patient Education module for education specifics.  °Outcome: Progressing °Goal: Skin Care Protocol Initiated - if Braden Score 18 or less °Description: If consults are not indicated, leave blank or document N/A °Outcome: Progressing °Goal: Nutrition Consult-if indicated °Outcome: Progressing °  °Problem: SCI BOWEL ELIMINATION °Goal: RH STG MANAGE BOWEL WITH ASSISTANCE °Description: STG Manage Bowel with max/total Assistance. °Outcome: Progressing °Goal: RH STG SCI MANAGE BOWEL WITH MEDICATION WITH ASSISTANCE °Description: STG SCI Manage bowel with medication with max/total assistance. °Outcome: Progressing °Goal: RH STG SCI MANAGE BOWEL PROGRAM W/ASSIST OR AS APPROPRIATE °Description: STG SCI Manage bowel program w/max/total assist or as appropriate. °Outcome: Progressing °  °Problem: SCI BLADDER ELIMINATION °Goal: RH STG MANAGE BLADDER WITH ASSISTANCE °Description: STG Manage Bladder With min Assistance °Outcome: Progressing °Goal: RH STG MANAGE BLADDER WITH MEDICATION WITH ASSISTANCE °Description: STG Manage Bladder With Medication With min Assistance. °Outcome: Progressing °  °Problem: RH SKIN INTEGRITY °Goal: RH STG SKIN FREE OF INFECTION/BREAKDOWN °Description: Skin to remain free from infection/breakdown while on rehab with min assist from staff. °Outcome: Progressing °Goal: RH STG MAINTAIN SKIN INTEGRITY WITH ASSISTANCE °Description: STG Maintain Skin Integrity With min Assistance. °Outcome: Progressing °Goal: RH STG ABLE TO PERFORM INCISION/WOUND CARE W/ASSISTANCE °Description: STG Able To Perform Incision/Wound Care With mod Assistance. °Outcome: Progressing °  °Problem: RH SAFETY °Goal: RH STG ADHERE TO SAFETY PRECAUTIONS W/ASSISTANCE/DEVICE °Description: STG Adhere to Safety Precautions With min Assistance and appropriate assistive Device. °Outcome: Progressing °  °Problem: RH PAIN MANAGEMENT °Goal: RH STG PAIN MANAGED  AT OR BELOW PT'S PAIN GOAL °Description: <4 on a 0-10 pain scale. °Outcome: Progressing °  °Problem: RH KNOWLEDGE DEFICIT SCI °Goal: RH STG INCREASE KNOWLEDGE OF SELF CARE AFTER SCI °Description: Patient will demonstrate knowledge of medication management, bladder program, bowel program, safety precautions, and follow up care with the MD post discharge with min assist from CIR staff. °Outcome: Progressing °  °

## 2020-05-28 NOTE — Progress Notes (Signed)
[facility] PHYSICAL MEDICINE & REHABILITATION PROGRESS NOTE   S: Slept well. No new complaints. Pain controlled  ROS: Patient denies fever, rash, sore throat, blurred vision, nausea, vomiting, diarrhea, cough, shortness of breath or chest pain, , headache, or mood change.    Objective:   No results found. No results for input(s): WBC, HGB, HCT, PLT in the last 72 hours. Recent Labs    05/26/20 0023  NA 136  K 3.7  CL 105  CO2 22  GLUCOSE 122*  BUN 14  CREATININE 0.52  CALCIUM 8.8*    Intake/Output Summary (Last 24 hours) at 05/28/2020 1126 Last data filed at 05/28/2020 0921 Gross per 24 hour  Intake 1229.66 ml  Output 2050 ml  Net -820.34 ml    Physical Exam: Vital Signs Blood pressure 101/75, pulse 97, temperature 97.6 F (36.4 C), temperature source Oral, resp. rate 14, height 5' 4"  (1.626 m), weight 56 kg, SpO2 98 %. Constitutional: No distress . Vital signs reviewed. Frail appearing HEENT: EOMI, oral membranes moist Neck: supple Cardiovascular: RRR without murmur. No JVD    Respiratory/Chest: CTA Bilaterally without wheezes or rales. Normal effort    GI/Abdomen: BS +, non-tender, non-distended Ext: no clubbing, cyanosis, or edema Psych: pleasant and cooperative Skin: Incision is clean dry and intact. Healing well.  Psych: appropriate, vague Musc: No edema in extremities.  No tenderness in extremities. Neuro: Alert Motor: Bilateral upper extremities: 4+/5 proximal to distal  Bilateral lower extremities: 4 -/5 proximal to distal   Assessment/Plan: 1. 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  Physiatrist is providing close team supervision and 24 hour management of active medical problems listed below.  Physiatrist and rehab team continue to assess barriers to discharge/monitor patient  progress toward functional and medical goals  Care Tool:  Bathing    Body parts bathed by patient: Chest, Abdomen, Front perineal area, Right upper leg, Left upper leg, Face   Body parts bathed by helper: Buttocks, Right arm, Left arm, Right lower leg, Left lower leg     Bathing assist Assist Level: Moderate Assistance - Patient 50 - 74%     Upper Body Dressing/Undressing Upper body dressing   What is the patient wearing?: Button up shirt    Upper body assist Assist Level: Moderate Assistance - Patient 50 - 74%    Lower Body Dressing/Undressing Lower body dressing      What is the patient wearing?: Pants     Lower body assist Assist for lower body dressing: Minimal Assistance - Patient > 75%     Toileting Toileting    Toileting assist Assist for toileting: Maximal Assistance - Patient 25 - 49%     Transfers Chair/bed transfer  Transfers assist     Chair/bed transfer assist level: Minimal Assistance - Patient > 75%     Locomotion Ambulation   Ambulation assist      Assist level: Minimal Assistance - Patient > 75% Assistive device: Parallel bars Max distance: 4 ft (6 steps)   Walk 10 feet activity   Assist     Assist level: 2 helpers Assistive device: Walker-rolling   Walk 50 feet activity   Assist Walk 50 feet with 2 turns activity did not occur: Safety/medical concerns         Walk 150 feet activity   Assist Walk 150 feet activity did not occur: Safety/medical  concerns         Walk 10 feet on uneven surface  activity   Assist Walk 10 feet on uneven surfaces activity did not occur: Safety/medical concerns         Wheelchair     Assist Will patient use wheelchair at discharge?:  (TBD)      Wheelchair assist level: Supervision/Verbal cueing Max wheelchair distance: 30 feet    Wheelchair 50 feet with 2 turns activity    Assist            Wheelchair 150 feet activity     Assist      Assist Level:  Independent   Blood pressure 101/75, pulse 97, temperature 97.6 F (36.4 C), temperature source Oral, resp. rate 14, height 5' 4"  (1.626 m), weight 56 kg, SpO2 98 %.  Medical Problem List and Plan: 1.  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  Antithrombotics: -DVT/anticoagulation:  Mechanical: Sequential compression devices, below knee Bilateral lower extremities  7/15: Continue Lovenox 2 months from surgery  - Dopplers negative.              -antiplatelet therapy: N/A 3. Pain Management/neuropathic pain: OxyContin 20 mg bid with oxycodone prn. Well controlled  Robaxin   Scheduled Tylenol.   Valium   Started Gabapentin 360m TID for left sided sciatic pain that is currently most severe to her  Oxycontin to 280mq12H.   Flector patch to paraspinal area  7/18 pain seems controlled  4. Mood: LCSW to follow for evaluation and support.              -antipsychotic agents:  N/A 5. Neuropsych: This patient is capable of making decisions on her own behalf. 6. Skin/Wound Care: Incision continues to heal well. Continue Juven to promote wound healing.  7. Fluids/Electrolytes/Nutrition: Monitor I/O. Start calcium supplement for hypocalcemia. 7/16 BMP is otherwise stable.  8. Disseminated MSSA infection with osteomyelitis, epidural abscess and R septic shoulder:  Cefepime X 6 weeks with weekly CBC/BMP/ESR/CRP-end date 06/03/20.  9. HTN: Monitor BP.   Stoped Amlodipine.   Stoped Lisinopril.  Vitals:   05/27/20 2017 05/28/20 0428  BP: 108/79 101/75  Pulse: (!) 105 97  Resp: 20 14  Temp: 98.2 F (36.8 C) 97.6 F (36.4 C)  SpO2: 100% 98%   Stoped Flomax.   7/13- BP good- off flomax- - can't restart flomax  7/18 controlled 10. Epidural abscess with diskitis: On IV antibiotics end date 06/03/20  11.  Chronic pancreatitis: Abdominal symptoms have resolved. To follow with [facility] for Lap chole on outpatient basis.  12. Hypokalemia: Hypomagnesemia has resolved with supplement. Will continue to keep Kdur at 40 meq bid as tends to drop when decreased once a day.   Potassium 4.0 on 7/9 13. Acute on chronic anemia: H/H stable.   Hemoglobin 8.9 on 7/14 14. Neurogenic bladder/Urinary retention:   low dose urecholine 1072mID, increased to 25 on 7/10  Continues to retain, consider further adjustments as necessary  7/12- teach in/out caths- agree with pt.  7/14-18: continue I/O teaching, still retaining urine 15. Neurogenic bowel-   Improving  16. 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 Dr. KirLeonel Ramsayes not recommend LP due to discitis but questioned subacute stroke. Dr. ComLinus Salmonses not feel septic  emboli is cause of changes and recommends no change in antibiotic regimen.   HIV ordered and nonreactive. 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 ID  Discussed nonreactive treponema pallidum test with patient.   7/12- pt able to think of learning in/out caths on her own- thinks it's heading/improving? 17. Dispo  7/13-   extend pt to next week.   LOS: 19 days A FACE TO FACE EVALUATION WAS PERFORMED  Meredith Staggers 05/28/2020, 11:26 AM

## 2020-05-28 NOTE — Plan of Care (Signed)
  Problem: Consults °Goal: RH SPINAL CORD INJURY PATIENT EDUCATION °Description:  See Patient Education module for education specifics.  °Outcome: Progressing °Goal: Skin Care Protocol Initiated - if Braden Score 18 or less °Description: If consults are not indicated, leave blank or document N/A °Outcome: Progressing °Goal: Nutrition Consult-if indicated °Outcome: Progressing °  °Problem: SCI BOWEL ELIMINATION °Goal: RH STG MANAGE BOWEL WITH ASSISTANCE °Description: STG Manage Bowel with max/total Assistance. °Outcome: Progressing °Goal: RH STG SCI MANAGE BOWEL WITH MEDICATION WITH ASSISTANCE °Description: STG SCI Manage bowel with medication with max/total assistance. °Outcome: Progressing °Goal: RH STG SCI MANAGE BOWEL PROGRAM W/ASSIST OR AS APPROPRIATE °Description: STG SCI Manage bowel program w/max/total assist or as appropriate. °Outcome: Progressing °  °Problem: SCI BLADDER ELIMINATION °Goal: RH STG MANAGE BLADDER WITH ASSISTANCE °Description: STG Manage Bladder With min Assistance °Outcome: Progressing °Goal: RH STG MANAGE BLADDER WITH MEDICATION WITH ASSISTANCE °Description: STG Manage Bladder With Medication With min Assistance. °Outcome: Progressing °  °Problem: RH SKIN INTEGRITY °Goal: RH STG SKIN FREE OF INFECTION/BREAKDOWN °Description: Skin to remain free from infection/breakdown while on rehab with min assist from staff. °Outcome: Progressing °Goal: RH STG MAINTAIN SKIN INTEGRITY WITH ASSISTANCE °Description: STG Maintain Skin Integrity With min Assistance. °Outcome: Progressing °Goal: RH STG ABLE TO PERFORM INCISION/WOUND CARE W/ASSISTANCE °Description: STG Able To Perform Incision/Wound Care With mod Assistance. °Outcome: Progressing °  °Problem: RH SAFETY °Goal: RH STG ADHERE TO SAFETY PRECAUTIONS W/ASSISTANCE/DEVICE °Description: STG Adhere to Safety Precautions With min Assistance and appropriate assistive Device. °Outcome: Progressing °  °Problem: RH PAIN MANAGEMENT °Goal: RH STG PAIN MANAGED  AT OR BELOW PT'S PAIN GOAL °Description: <4 on a 0-10 pain scale. °Outcome: Progressing °  °Problem: RH KNOWLEDGE DEFICIT SCI °Goal: RH STG INCREASE KNOWLEDGE OF SELF CARE AFTER SCI °Description: Patient will demonstrate knowledge of medication management, bladder program, bowel program, safety precautions, and follow up care with the MD post discharge with min assist from CIR staff. °Outcome: Progressing °  °

## 2020-05-29 ENCOUNTER — Inpatient Hospital Stay (HOSPITAL_COMMUNITY): Payer: Self-pay

## 2020-05-29 ENCOUNTER — Inpatient Hospital Stay (HOSPITAL_COMMUNITY): Payer: Self-pay | Admitting: Occupational Therapy

## 2020-05-29 ENCOUNTER — Inpatient Hospital Stay (HOSPITAL_COMMUNITY): Payer: Self-pay | Admitting: Physical Therapy

## 2020-05-29 LAB — CBC WITH DIFFERENTIAL/PLATELET
Abs Immature Granulocytes: 0.01 10*3/uL (ref 0.00–0.07)
Basophils Absolute: 0 10*3/uL (ref 0.0–0.1)
Basophils Relative: 1 %
Eosinophils Absolute: 0.4 10*3/uL (ref 0.0–0.5)
Eosinophils Relative: 10 %
HCT: 30.8 % — ABNORMAL LOW (ref 36.0–46.0)
Hemoglobin: 9.6 g/dL — ABNORMAL LOW (ref 12.0–15.0)
Immature Granulocytes: 0 %
Lymphocytes Relative: 34 %
Lymphs Abs: 1.3 10*3/uL (ref 0.7–4.0)
MCH: 27.2 pg (ref 26.0–34.0)
MCHC: 31.2 g/dL (ref 30.0–36.0)
MCV: 87.3 fL (ref 80.0–100.0)
Monocytes Absolute: 0.4 10*3/uL (ref 0.1–1.0)
Monocytes Relative: 11 %
Neutro Abs: 1.7 10*3/uL (ref 1.7–7.7)
Neutrophils Relative %: 44 %
Platelets: 375 10*3/uL (ref 150–400)
RBC: 3.53 MIL/uL — ABNORMAL LOW (ref 3.87–5.11)
RDW: 15.3 % (ref 11.5–15.5)
WBC: 3.8 10*3/uL — ABNORMAL LOW (ref 4.0–10.5)
nRBC: 0 % (ref 0.0–0.2)

## 2020-05-29 LAB — BASIC METABOLIC PANEL
Anion gap: 9 (ref 5–15)
BUN: 10 mg/dL (ref 6–20)
CO2: 28 mmol/L (ref 22–32)
Calcium: 10 mg/dL (ref 8.9–10.3)
Chloride: 101 mmol/L (ref 98–111)
Creatinine, Ser: 0.58 mg/dL (ref 0.44–1.00)
GFR calc Af Amer: >60 mL/min (ref >60)
GFR calc non Af Amer: >60 mL/min (ref >60)
Glucose, Bld: 112 mg/dL — ABNORMAL HIGH (ref 70–99)
Potassium: 3.9 mmol/L (ref 3.5–5.1)
Sodium: 138 mmol/L (ref 135–145)

## 2020-05-29 LAB — SEDIMENTATION RATE: Sed Rate: 123 mm/hr — ABNORMAL HIGH (ref 0–22)

## 2020-05-29 LAB — C-REACTIVE PROTEIN: CRP: 0.7 mg/dL (ref <1.0)

## 2020-05-29 NOTE — Progress Notes (Signed)
Patient ID: Debra Schmidt, female   DOB: 03-23-1961, 59 y.o.   MRN: [id number]  Sw received phone call from pt husband Debra Schmidt who reported that his sister was sick and she was unable to come in for family education. He wanted to know when he and their son could come in. SW discussed today's afternoon sessions and tomorrow 9am-11am. He intends to be present for both. He states pt did received 3in1 BSC and RW from acute hospital.   Cecile Sheerer, MSW, LCSWA Office: [phone] Cell: [phone] Fax: [phone]

## 2020-05-29 NOTE — Progress Notes (Signed)
Occupational Therapy Session Note  Patient Details  Name: Debra Schmidt MRN: [id number] Date of Birth: Apr 06, 1961  Today's Date: 05/29/2020 OT Individual Time: 1345-1430 OT Individual Time Calculation (min): 45 min    Short Term Goals: Week 2:  OT Short Term Goal 1 (Week 2): STG=LTG secondary to ELOS OT Short Term Goal 1 - Progress (Week 2): Progressing toward goal OT Short Term Goal 4 - Progress (Week 2): Progressing toward goal Week 3:  OT Short Term Goal 1 (Week 3): STG=LTG secondary to ELOS  Skilled Therapeutic Interventions/Progress Updates:    Pt resting in bed upon arrival.  Husband and son present for education.  Therapist demonstrated bed mobility and basic transfers with RW. Supine>sit EOB with no rails with min A for sidelying to sitting EOB. Pt performed stand pivot transfer with CGA and min verbal cues for safety awareness.  Pt returned to EOB.  Pt's husband demonstrated with pt stand pivot transfer with RW and return to bed.  Pt's husband doffed/donned LSO for pt. Pt's husband assisted pt to supine following back precautions.  Pt's husband will be returning tomorrow morning for continued education. Pt remained in bed with bed alarm activated and all needs within reach.   Therapy Documentation Precautions:  Precautions Precautions: Fall, Back Precaution Booklet Issued: No Precaution Comments:  (Reviewed back precautions) Required Braces or Orthoses: Spinal Brace Spinal Brace: Lumbar corset, Applied in sitting position Restrictions Weight Bearing Restrictions: No RUE Weight Bearing: Weight bearing as tolerated Other Position/Activity Restrictions: no overhead movement RUE Pain: Pt c/o increased "cramping" in BLE after activity with therapy; repositioned and RN notified  Therapy/Group: Individual Therapy  Rich Brave 05/29/2020, 2:42 PM

## 2020-05-29 NOTE — Plan of Care (Signed)
  Problem: RH SAFETY Goal: RH STG ADHERE TO SAFETY PRECAUTIONS W/ASSISTANCE/DEVICE Description: STG Adhere to Safety Precautions With min Assistance and appropriate assistive Device. Outcome: Progressing   Problem: RH PAIN MANAGEMENT Goal: RH STG PAIN MANAGED AT OR BELOW PT'S PAIN GOAL Description: <4 on a 0-10 pain scale. Outcome: Progressing

## 2020-05-29 NOTE — Progress Notes (Signed)
Physical Therapy Session Note  Patient Details  Name: Debra Schmidt MRN: [id number] Date of Birth: [birth year]  Today's Date: 05/29/2020 PT Individual Time: 1100-1200 PT Individual Time Calculation (min): 60 min   Short Term Goals: Week 3:  PT Short Term Goal 1 (Week 3): STG = LTG d/t ELOS  Skilled Therapeutic Interventions/Progress Updates:  Pt received seated in W/C in room with lumbar corset and ACE wraps to B/L thighs in place. Agreeable to therapy. Denies any pain at rest prior to start of therapy session. Mild erythematous rash noted to LUE as a result of known allergy to adhesive tape- RN already aware and monitoring. Dependent W/C transfer from room to therapy gym for time conservation. Pt performed sit <> stand with CGA throughout session. In the parallel bars, ambulated 3 x 10 feet with both hands on bars, stance knee blocked by therapist, CGA, and a step-through pattern. Pt demonstrates a narrow BOS and decreased step length with verbal cues required for upright posture. In the parallel bars, performed sit <> stand 2 x 5 with both hands on bars, B/L knees blocked by therapist, and CGA. In the hallway, pt propelled W/C 250 feet to room with close supervision overall (occasional min A required for steering). Squat pivot transfer from W/C to EOB with CGA. Lumbar corset doffed with pt seated at EOB. Sit to supine in bed with min A for LLE management. Pt left supine in bed with all needs in reach. All questions answered. Pt demonstrated tremendous improvement in performing functional activity today, and reports that exercises performed today did not aggravate her pain.  Therapy Documentation Precautions:  Precautions Precautions: Fall, Back Precaution Booklet Issued: No Precaution Comments:  (Reviewed back precautions) Required Braces or Orthoses: Spinal Brace Spinal Brace: Lumbar corset, Applied in sitting position Restrictions Weight Bearing Restrictions: No RUE Weight Bearing:  Weight bearing as tolerated Other Position/Activity Restrictions: no overhead movement RUE  Therapy/Group: Individual Therapy  Murrell Redden, SPT 05/29/2020, 12:12 PM

## 2020-05-29 NOTE — Progress Notes (Signed)
[facility] PHYSICAL MEDICINE & REHABILITATION PROGRESS NOTE   S:   Pt reports back aches a little, but "she expects that". Also having sports cramps as she calls them, in calves- ACE wraps help.   Took a few steps  ROS:  Pt denies SOB, abd pain, CP, N/V/C/D, and vision changes   Objective:   No results found. Recent Labs    05/29/20 0346  WBC 3.8*  HGB 9.6*  HCT 30.8*  PLT 375   Recent Labs    05/29/20 0346  NA 138  K 3.9  CL 101  CO2 28  GLUCOSE 112*  BUN 10  CREATININE 0.58  CALCIUM 10.0    Intake/Output Summary (Last 24 hours) at 05/29/2020 1520 Last data filed at 05/29/2020 1330 Gross per 24 hour  Intake 871.2 ml  Output 1850 ml  Net -978.8 ml    Physical Exam: Vital Signs Blood pressure 118/77, pulse 88, temperature 98.2 F (36.8 C), resp. rate 16, height 5' 4"  (1.626 m), weight 56 kg, SpO2 100 %. Constitutional: No distress . Vital signs reviewed. Sitting up in manual w/c at sink, washing face, NAD HEENT: EOMI, oral membranes moist Neck: supple Cardiovascular: RRR   Respiratory/Chest: CTA B/L- no W/R/R- good air movement   GI/Abdomen: Soft, NT, ND, (+)BS  Ext: no clubbing, cyanosis, or edema Psych: appropriate Skin: Incision is clean dry and intact. Healing well.  Musc: No edema in extremities.  No tenderness in extremities. Neuro: Alert- Motor: Bilateral upper extremities: 4+/5 proximal to distal  Bilateral lower extremities: 4 -/5 proximal to distal   Assessment/Plan: 1. 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  Physiatrist is providing close team supervision and 24 hour management of active medical problems listed below.  Physiatrist and rehab team continue to assess barriers to discharge/monitor patient progress toward functional and medical goals  Care  Tool:  Bathing    Body parts bathed by patient: Chest, Abdomen, Front perineal area, Right upper leg, Left upper leg, Face   Body parts bathed by helper: Buttocks, Right arm, Left arm, Right lower leg, Left lower leg     Bathing assist Assist Level: Moderate Assistance - Patient 50 - 74%     Upper Body Dressing/Undressing Upper body dressing   What is the patient wearing?: Button up shirt    Upper body assist Assist Level: Moderate Assistance - Patient 50 - 74%    Lower Body Dressing/Undressing Lower body dressing      What is the patient wearing?: Pants     Lower body assist Assist for lower body dressing: Minimal Assistance - Patient > 75%     Toileting Toileting    Toileting assist Assist for toileting: Maximal Assistance - Patient 25 - 49%     Transfers Chair/bed transfer  Transfers assist     Chair/bed transfer assist level: Minimal Assistance - Patient > 75%     Locomotion Ambulation   Ambulation assist      Assist level: Minimal Assistance - Patient > 75% Assistive device: Parallel bars Max distance: 4 ft (6 steps)   Walk 10 feet activity   Assist     Assist level: 2 helpers Assistive device: Walker-rolling   Walk 50 feet activity   Assist Walk 50 feet with 2 turns activity did not occur: Safety/medical concerns         Walk 150  feet activity   Assist Walk 150 feet activity did not occur: Safety/medical concerns         Walk 10 feet on uneven surface  activity   Assist Walk 10 feet on uneven surfaces activity did not occur: Safety/medical concerns         Wheelchair     Assist Will patient use wheelchair at discharge?:  (TBD)      Wheelchair assist level: Supervision/Verbal cueing Max wheelchair distance: 30 feet    Wheelchair 50 feet with 2 turns activity    Assist            Wheelchair 150 feet activity     Assist      Assist Level: Independent   Blood pressure 118/77, pulse 88,  temperature 98.2 F (36.8 C), resp. rate 16, height 5' 4"  (1.626 m), weight 56 kg, SpO2 100 %.  Medical Problem List and Plan: 1.  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  Antithrombotics: -DVT/anticoagulation:  Mechanical: Sequential compression devices, below knee Bilateral lower extremities  7/15: Continue Lovenox 2 months from surgery  - Dopplers negative.              -antiplatelet therapy: N/A 3. Pain Management/neuropathic pain: OxyContin 20 mg bid with oxycodone prn.   Robaxin   Scheduled Tylenol.   Valium   Started Gabapentin 328m TID for left sided sciatic pain that is currently most severe to her  Oxycontin to 281mq12H.   Flector patch to paraspinal area  7/18 pain seems controlled  7/19- doing much better- having calves cramps- ACE wraps help  4. Mood: LCSW to follow for evaluation and support.              -antipsychotic agents:  N/A 5. Neuropsych: This patient is capable of making decisions on her own behalf. 6. Skin/Wound Care: Incision continues to heal well. Continue Juven to promote wound healing.  7. Fluids/Electrolytes/Nutrition: Monitor I/O. Start calcium supplement for hypocalcemia. 7/16 BMP is otherwise stable.  8. Disseminated MSSA infection with osteomyelitis, epidural abscess and R septic shoulder:  Cefepime X 6 weeks with weekly CBC/BMP/ESR/CRP-end date 06/03/20.  9. HTN: Monitor BP.   Stoped Amlodipine.   Stoped Lisinopril.  Vitals:   05/29/20 0449 05/29/20 1329  BP: 120/77 118/77  Pulse: 88 88  Resp: 20 16  Temp: 97.8 F (36.6 C) 98.2 F (36.8 C)  SpO2: 99% 100%   Stoped Flomax.   7/13- BP good- off flomax- - can't restart flomax  7/19- BP controlled- no flomax 10. Epidural abscess with diskitis: On IV antibiotics end date 06/03/20  11. Chronic  pancreatitis: Abdominal symptoms have resolved. To follow with [facility] for Lap chole on outpatient basis.  12. Hypokalemia: Hypomagnesemia has resolved with supplement. Will continue to keep Kdur at 40 meq bid as tends to drop when decreased once a day.   Potassium 4.0 on 7/9  7/19- K+ 3.9 13. Acute on chronic anemia: H/H stable.   Hemoglobin 8.9 on 7/14 14. Neurogenic bladder/Urinary retention:   low dose urecholine 1074mID, increased to 25 on 7/10  Continues to retain, consider further adjustments as necessary  7/12- teach in/out caths- agree with pt.  7/14-18: continue I/O teaching, still retaining urine  7/19- learning in/out caths still 15. Neurogenic bowel-   Improving  16. Impaired cognition: MRI shows subcortical  lesions of unclear etiology: differential includes PML, infarct, glioma- neurology and ID consulted. Dr. Leonel Ramsay does not recommend LP due to discitis but questioned subacute stroke. Dr. Linus Salmons does not feel septic emboli is cause of changes and recommends no change in antibiotic regimen.   HIV ordered and nonreactive. 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 ID  Discussed nonreactive treponema pallidum test with patient.   7/12- pt able to think of learning in/out caths on her own- thinks it's heading/improving? 17. Dispo  7/13-   extend pt to next week.  7/19- d/c 7/24 after last dose of IV ABX   LOS: 20 days A FACE TO FACE EVALUATION WAS PERFORMED  Cherish Runde 05/29/2020, 3:20 PM

## 2020-05-29 NOTE — Plan of Care (Signed)
  Problem: RH Bed to Chair Transfers Goal: LTG Patient will perform bed/chair transfers w/assist (PT) Description: LTG: Patient will perform bed to chair transfers with assistance (PT). Flowsheets (Taken 05/29/2020 1607) LTG: Pt will perform Bed to Chair Transfers with assistance level: (downgrade due to slow progress) Contact Guard/Touching assist Note: downgrade due to slow progress   Problem: RH Car Transfers Goal: LTG Patient will perform car transfers with assist (PT) Description: LTG: Patient will perform car transfers with assistance (PT). Flowsheets (Taken 05/29/2020 1607) LTG: Pt will perform car transfers with assist:: (downgrade due to slow progress) Minimal Assistance - Patient > 75% Note: downgrade due to slow progress   Problem: RH Ambulation Goal: LTG Patient will ambulate in controlled environment (PT) Description: LTG: Patient will ambulate in a controlled environment, # of feet with assistance (PT). Flowsheets (Taken 05/29/2020 1607) LTG: Pt will ambulate in controlled environ  assist needed:: (downgrade due to slow progress) Minimal Assistance - Patient > 75% LTG: Ambulation distance in controlled environment: 10 ft with RW Note: downgrade due to slow progress Goal: LTG Patient will ambulate in home environment (PT) Description: LTG: Patient will ambulate in home environment, # of feet with assistance (PT). Flowsheets (Taken 05/29/2020 1607) LTG: Pt will ambulate in home environ  assist needed:: (d/c goal due to slow progress) -- Note: D/c goal due to slow progress   Problem: RH Stairs Goal: LTG Patient will ambulate up and down stairs w/assist (PT) Description: LTG: Patient will ambulate up and down # of stairs with assistance (PT) Flowsheets (Taken 05/29/2020 1607) LTG: Pt will ambulate up/down stairs assist needed:: (d/c goal due to slow progress) -- Note: D/c goal due to slow progress   Problem: Sit to Stand Goal: LTG:  Patient will perform sit to stand with  assistance level (PT) Description: LTG:  Patient will perform sit to stand with assistance level (PT) Flowsheets (Taken 05/29/2020 1607) LTG: PT will perform sit to stand in preparation for functional mobility with assistance level: Contact Guard/Touching assist   Problem: RH Wheelchair Mobility Goal: LTG Patient will propel w/c in controlled environment (PT) Description: LTG: Patient will propel wheelchair in controlled environment, # of feet with assist (PT) Flowsheets (Taken 05/29/2020 1610) LTG: Pt will propel w/c in controlled environ  assist needed:: (add goal due to progress) Supervision/Verbal cueing LTG: Propel w/c distance in controlled environment: 150 ft Note: Add goal due to progress Goal: LTG Patient will propel w/c in home environment (PT) Description: LTG: Patient will propel wheelchair in home environment, # of feet with assistance (PT). Flowsheets (Taken 05/29/2020 1610) LTG: Pt will propel w/c in home environ  assist needed:: (add goal due to progress) Supervision/Verbal cueing LTG: Propel w/c distance in home environment: 75 ft Note: Add goal due to progress

## 2020-05-29 NOTE — Progress Notes (Signed)
Occupational Therapy Session Note  Patient Details  Name: MELENIE MINNIEAR MRN: [id number] Date of Birth: 1961/03/06  Today's Date: 05/29/2020 OT Individual Time: 4627-0350 OT Individual Time Calculation (min): 60 min    Short Term Goals: Week 3:  OT Short Term Goal 1 (Week 3): STG=LTG secondary to ELOS  Skilled Therapeutic Interventions/Progress Updates:    OT intervention with focus on bed mobility, sit<>stand, standing balance, functional transfers, donning socks/shoes with AE, discharge planning, and safety awareness to increase independence with BADLs. Bed mobility with supervision using bed rails. Sit<>stand and stand pivot transfers with CGA and min verbal cues for positioning and safety awareness. Pt practiced doffing/donning socks/shoes using AE PRN. Pt able to doff socks without assistance crossing LE in figure 4 configuration. Pt able to don R sock and shoe with figure 4. Pt used sock aid and reacher to assist with LLE.  Discussed DME and pt states she already has BSC and RW. Confirmed with CSW. Pt remained in w/c with belt alarm activated and all needs within reach.   Therapy Documentation Precautions:  Precautions Precautions: Fall, Back Precaution Booklet Issued: No Precaution Comments:  (Reviewed back precautions) Required Braces or Orthoses: Spinal Brace Spinal Brace: Lumbar corset, Applied in sitting position Restrictions Weight Bearing Restrictions: No RUE Weight Bearing: Weight bearing as tolerated Other Position/Activity Restrictions: no overhead movement RUE    Pain:  Pt c/o some "cramping" in back; repositioned and LSO tightened with noted relief   Therapy/Group: Individual Therapy  Rich Brave 05/29/2020, 9:17 AM

## 2020-05-29 NOTE — Progress Notes (Signed)
Occupational Therapy Session Note  Patient Details  Name: MAKYA PHILLIS MRN: [id number] Date of Birth: [birth year]  Today's Date: 05/29/2020 OT Individual Time: 1530-1600 OT Individual Time Calculation (min): 30 min    Short Term Goals: Week 1:  OT Short Term Goal 1 (Week 1): Pt will perform toileting with min A overall. OT Short Term Goal 1 - Progress (Week 1): Progressing toward goal OT Short Term Goal 2 (Week 1): Pt will perform LB dressing with use of AE or modifcations with min A. OT Short Term Goal 2 - Progress (Week 1): Progressing toward goal OT Short Term Goal 3 (Week 1): Pt perform toilet transfer with min A. OT Short Term Goal 3 - Progress (Week 1): Met OT Short Term Goal 4 (Week 1): Pt will stand for 2 minutes with min A or less for balance during self care tasks. OT Short Term Goal 4 - Progress (Week 1): Progressing toward goal Week 2:  OT Short Term Goal 1 (Week 2): STG=LTG secondary to ELOS OT Short Term Goal 1 - Progress (Week 2): Progressing toward goal OT Short Term Goal 4 - Progress (Week 2): Progressing toward goal Week 3:  OT Short Term Goal 1 (Week 3): STG=LTG secondary to ELOS  Skilled Therapeutic Interventions/Progress Updates:    Pt received in bed stating her back was hurting from an earlier movement in bed.  She talked about her plans for going home.  Pt worked on a/arom exercises for her LE in bed. Pt tolerated well. Resting in bed with all needs met.  Bed alarm set.   Therapy Documentation Precautions:  Precautions Precautions: Fall, Back Precaution Booklet Issued: No Precaution Comments:  (Reviewed back precautions) Required Braces or Orthoses: Spinal Brace Spinal Brace: Lumbar corset, Applied in sitting position Restrictions Weight Bearing Restrictions: No RUE Weight Bearing: Weight bearing as tolerated Other Position/Activity Restrictions: no overhead movement RUE Therapy Vitals Temp: 98.2 F (36.8 C) Pulse Rate: 88 Resp: 16 BP:  118/77 Patient Position (if appropriate): Lying Oxygen Therapy SpO2: 100 % O2 Device: Room Air Pain: Pain Assessment Pain Scale: 0-10 Pain Score: 8  Faces Pain Scale: Hurts whole lot Pain Type: Acute pain;Surgical pain Pain Location: Back Pain Orientation: Lower;Medial Pain Descriptors / Indicators: Aching;Discomfort;Crying Pain Onset: On-going Patients Stated Pain Goal: 3 Pain Intervention(s): Medication (See eMAR)   Therapy/Group: Individual Therapy  [place] 05/29/2020, 4:17 PM

## 2020-05-30 ENCOUNTER — Telehealth: Payer: Self-pay

## 2020-05-30 ENCOUNTER — Inpatient Hospital Stay (HOSPITAL_COMMUNITY): Payer: Self-pay

## 2020-05-30 ENCOUNTER — Ambulatory Visit (HOSPITAL_COMMUNITY): Payer: Self-pay | Admitting: Physical Therapy

## 2020-05-30 ENCOUNTER — Encounter (HOSPITAL_COMMUNITY): Payer: Self-pay

## 2020-05-30 MED ORDER — CEPHALEXIN 250 MG PO CAPS
500.0000 mg | ORAL_CAPSULE | Freq: Four times a day (QID) | ORAL | Status: DC
  Administered 2020-06-04: 500 mg via ORAL
  Filled 2020-05-30: qty 2

## 2020-05-30 MED ORDER — ACETAMINOPHEN 325 MG PO TABS: 650.0000 mg | ORAL_TABLET | Freq: Three times a day (TID) | ORAL | Status: AC

## 2020-05-30 MED ORDER — CALCIUM CITRATE 950 (200 CA) MG PO TABS: 200.0000 mg | ORAL_TABLET | Freq: Every day | ORAL | Status: DC

## 2020-05-30 NOTE — Plan of Care (Signed)
  Problem: SCI BLADDER ELIMINATION Goal: RH STG MANAGE BLADDER WITH ASSISTANCE Description: STG Manage Bladder With min Assistance Outcome: Progressing Goal: RH STG MANAGE BLADDER WITH MEDICATION WITH ASSISTANCE Description: STG Manage Bladder With Medication With min Assistance. Outcome: Progressing   Problem: RH PAIN MANAGEMENT Goal: RH STG PAIN MANAGED AT OR BELOW PT'S PAIN GOAL Description: <4 on a 0-10 pain scale. Outcome: Progressing   

## 2020-05-30 NOTE — Progress Notes (Signed)
Physical Therapy Session Note  Patient Details  Name: Debra Schmidt MRN: [id number] Date of Birth: 1961-04-06  Today's Date: 05/30/2020 PT Individual Time: 1000-1100 PT Individual Time Calculation (min): 60 min   Short Term Goals: Week 3:  PT Short Term Goal 1 (Week 3): STG = LTG d/t ELOS  Skilled Therapeutic Interventions/Progress Updates:  Pt received supine in bed in room. Husband and sister-in-law present for family education. Agreeable to therapy. Pt states she is experiencing moderate low back pain at rest prior to start of therapy session. Pain addressed with positioning and frequent rest breaks throughout session. RN also made aware of patient's pain and brought pain medication during session. Supine to sit at EOB with min A. Verbal cues required for bending R knee and hand placement on handrail. Lumbar corset donned with pt seated at EOB by husband. Squat pivot transfer from elevated bed (to mimic pt's bed at home) <> W/C with CGA. Verbal cues required for correct hand placement while performing SPT. Pt propelled W/C x50 feet in hallway using BUEs with supervision before fatiguing; dependent W/C transfer remainder of distance to therapy gym for time conservation. Performed car transfer with car seat lowered (to mimic pt's car) with CGA. Verbal cues required for correct hand placement while performing car transfer. Reviewed W/C armrest, brake, foot rest, and anti-tipper management with family. Demonstrated navigating 4 inch curb (same height as step to patient's house) with pt seated in W/C. Dependent W/C transfer from therapy gym to room for time conservation. Squat pivot transfer from W/C to EOB with CGA. Sit to supine in bed with CGA. Pt left supine in bed with all needs in reach, family at bedside. Demonstrated all activities to family which they return demonstrated throughout session. Family is comfortable caring for pt on return home. All questions answered.  Therapy  Documentation Precautions:  Precautions Precautions: Fall, Back Precaution Booklet Issued: No Precaution Comments:  (Reviewed back precautions) Required Braces or Orthoses: Spinal Brace Spinal Brace: Lumbar corset, Applied in sitting position Restrictions Weight Bearing Restrictions: No RUE Weight Bearing: Weight bearing as tolerated Other Position/Activity Restrictions: no overhead movement RUE  Therapy/Group: Individual Therapy  Murrell Redden, SPT 05/30/2020, 12:36 PM

## 2020-05-30 NOTE — Telephone Encounter (Signed)
Spoke with patient's husband; ID provider has been in contact with rehab provider about medication recommendations and discharge plan. Husband verbalized understanding.  Jadan Hinojos [facility], RN

## 2020-05-30 NOTE — Progress Notes (Signed)
Occupational Therapy Session Note  Patient Details  Name: Debra Schmidt MRN: [id number] Date of Birth: [birth year]  Today's Date: 05/30/2020 OT Individual Time: 0900-1000 OT Individual Time Calculation (min): 60 min    Short Term Goals: Week 3:  OT Short Term Goal 1 (Week 3): STG=LTG secondary to ELOS  Skilled Therapeutic Interventions/Progress Updates:    Pt resting in bed upon arrival with husband and sister-in-law [name]) present for education. Focus on bed mobility, functional transfers, walk-in shower transfers, home safety, and safety awareness to prepare for discharge on 7/25. Husband and [name] assisted pt with shower transfers, bed mobility, and functional transfers.  Husband and Lucy independent with w/c setup and donning/doffing LSO. Pt returned to bed with all needs within reach and bed alarm activated. Pt completed all transfers with min A.   Therapy Documentation Precautions:  Precautions Precautions: Fall, Back Precaution Booklet Issued: No Precaution Comments:  (Reviewed back precautions) Required Braces or Orthoses: Spinal Brace Spinal Brace: Lumbar corset, Applied in sitting position Restrictions Weight Bearing Restrictions: No RUE Weight Bearing: Weight bearing as tolerated Other Position/Activity Restrictions: no overhead movement RUE    Pain: Pain Assessment Pain Scale: 0-10 Pain Score: 4  with increased spasms in LLE at end of session; repositioned, soft tissue mobilizations   Therapy/Group: Individual Therapy  Rich Brave 05/30/2020, 9:58 AM

## 2020-05-30 NOTE — Progress Notes (Signed)
Occupational Therapy Session Note  Patient Details  Name: VERMELL MADRID MRN: [id number] Date of Birth: 1961/04/17  Today's Date: 05/30/2020 OT Individual Time: 1300-1355 OT Individual Time Calculation (min): 55 min    Short Term Goals: Week 3:  OT Short Term Goal 1 (Week 3): STG=LTG secondary to ELOS  Skilled Therapeutic Interventions/Progress Updates:    Pt resting in bed upon arrival and agreeable to therapy.  Pain as noted below.  OT intervention with focus on bed mobility, sitting balance, sit<>stand, standing balance, BSC transfers and clothing management.  Sit<>stand from EOB X 5 with CGA and min verbal cues for safety awareness.  Pt continues to require verbal cues for BUE positioning and sequencing with sit<>stand. Stand pivot transfer to [facility] with min A and min verbal cues for safety and sequencing.  Pt required mod A for clothing management while standing at [facility]. Pt requires multiple rest breaks during session.  Pt returned to bed and remained in bed with all needs within reach and bed alarm activated.   Therapy Documentation Precautions:  Precautions Precautions: Fall, Back Precaution Booklet Issued: No Precaution Comments:  (Reviewed back precautions) Required Braces or Orthoses: Spinal Brace Spinal Brace: Lumbar corset, Applied in sitting position Restrictions Weight Bearing Restrictions: No RUE Weight Bearing: Weight bearing as tolerated Other Position/Activity Restrictions: no overhead movement RUE Pain:  Pt c/o LLE spasms; repositioned and heat applied with relief noted   Therapy/Group: Individual Therapy  Rich Brave 05/30/2020, 2:41 PM

## 2020-05-30 NOTE — Progress Notes (Signed)
Discussed patient's antibiotic regimen/follow up as well as the fact that she was still hospitalized with Dr. Daiva Eves. He recommends transitioning patient to Keflex 500 mg qid after completion of IV atbx (has been tolerating Cefazolin without SE) and follow up with Dr. Luciana Axe 8/12 at 9 am.

## 2020-05-30 NOTE — Progress Notes (Signed)
Physical Therapy Session Note  Patient Details  Name: Debra Schmidt MRN: [id number] Date of Birth: 1961-08-04  Today's Date: 05/30/2020 PT Individual Time: 1530-1600 PT Individual Time Calculation (min): 30 min   Short Term Goals: Week 3:  PT Short Term Goal 1 (Week 3): STG = LTG d/t ELOS  Skilled Therapeutic Interventions/Progress Updates:     Patient in bed upon PT arrival. Patient alert and agreeable to PT session. Patient reported mild-severe cramping B LE pain during session, RN made aware. PT provided repositioning, rest breaks, and distraction as pain interventions throughout session.   Therapeutic Activity: Bed Mobility: Patient performed supine to/from sit with supervision. Provided verbal cues for maintaining back precautions with mobility. Donned LSO with patient in sitting with total A. Transfers: Patient performed sit to/from stand x4 with min A-CGA using RW. Provided verbal cues for hand placement, patient reports she can only perform stand with B hands on the RW at this time due to pain.  Gait Training:  Patient performed pre-gait marching in standing x2 for <30 sec before onset of increased LE muscle cramps/spasms with CGA using RW. Provided cues for increased weight shift and reduced height of stepping in attempts to increase patient's tolerance of this activity. Patient ambulated 7 feet x2 with close w/c follow for safety using RW with CGA. Ambulated with decreased step length and height, crouched gait without knee buckling, increased use of UEs, and significant fear of falling and reports of increased muscle spasms limiting ambulation distance. Provided verbal cues for increased B quad activation, encouraged increased distance, and increased step height.  Patient continues to require increased time and rest breaks with mobility due to decreased activity tolerance and increased muscle spasms with mobility. Patient with tangential conversation throughout session requiring  redirection for maintaining attention to activities during session.  Patient in bed with LSO doffed at end of session with breaks locked, bed alarm set, and all needs within reach.    Therapy Documentation Precautions:  Precautions Precautions: Fall, Back Precaution Booklet Issued: No Precaution Comments:  (Reviewed back precautions) Required Braces or Orthoses: Spinal Brace Spinal Brace: Lumbar corset, Applied in sitting position Restrictions Weight Bearing Restrictions: No RUE Weight Bearing: Weight bearing as tolerated Other Position/Activity Restrictions: no overhead movement RUE    Therapy/Group: Individual Therapy  Dink Creps L Lyam Provencio PT, DPT  05/30/2020, 5:21 PM

## 2020-05-30 NOTE — Telephone Encounter (Signed)
-----   Message from [facility] sent at 05/30/2020 11:41 AM EDT ----- Patient is still in the hospital, husband said she will not get discharged until Sunday. If there was anyway a provider could go and see her before her discharge because she will not be about to leave her house for several day after.

## 2020-05-30 NOTE — Patient Care Conference (Signed)
Inpatient RehabilitationTeam Conference and Plan of Care Update Date: 05/30/2020   Time: 3:47 PM    Patient Name: Debra Schmidt      Medical Record Number: [id number]  Date of Birth: [birth year] Sex: Female         Room/Bed: 2Q33H/5K56Y-56 Payor Info: Payor: /    Admit Date/Time:  05/09/2020  4:12 PM  Primary Diagnosis:  Incomplete paraplegia [facility])  Hospital Problems: Principal Problem:   Incomplete paraplegia (HCC) Active Problems:   Abscess in epidural space of lumbar spine   Neurogenic bladder   Neurogenic bowel   Acute on chronic anemia   History of hypertension   Neuropathic pain    Expected Discharge Date: Expected Discharge Date: 06/04/20  Team Members Present: Physician leading conference: Dr. Genice Rouge Care Coodinator Present: Cecile Sheerer, LCSWA;Tyrel Lex Marlyne Beards, RN, BSN, CRRN Nurse Present: Harle Battiest, RN PT Present: Peter Congo, PT OT Present: Ardis Rowan, COTA PPS Coordinator present : Fae Pippin, SLP     Current Status/Progress Goal Weekly Team Focus  Bowel/Bladder   Inability to urinate. Q6H I&O self catherization schedule.  Pt to become more familiar with self catherization  Assess toileting needs PRN and continue assisting pt with self catherizations for home.   Swallow/Nutrition/ Hydration             ADL's   bed mobility-min A; transfers-min A; UB bathing/dressing-supervision; LB bathing/dressing-min A/mod A; min verbal cues for safety awareness  goals downgraded: toilet transfers/toileting-min A; LB dressing-min A; dynamic standing balance-min A; bathing and UB dressing-supervision  activity tolerance, BADL training, education, discharge planning   Mobility   min A overall bed mobility and transfers, short distance gait in // bars min A, Supervision w/c mobility  downgrade to min A overall at w/c level  family education, d/c planning, gait training as tolerated   Communication             Safety/Cognition/ Behavioral  Observations            Pain   Pt complains of back spasms and mild pain with movement.  Decrease pain level and get control of spasms  Assess pain QShift and PRN   Skin   Incision areas on back and shoulder. Insisions healed with no dressing.  Maintain good skin integrity  Assess QShift     Team Discussion:  Discharge Planning/Teaching Needs:  D/c to home with her husband who will provide intermittent support 9pm-6am; 60 y.o. son home during the day; sister in law Lucy to provide assistance as well . Pt is uninsured and will require IV abx until 7/24.  Family education as recommended by therapy; Fam edu on 7/20 9am-11am with pt husband and son.   Current Update:  none  Current Barriers to Discharge:  DVT prevention teaching.  Possible Resolutions to Barriers: Nursing to teach Lovenox injection administration to patient and family.  Patient on target to meet rehab goals: yes, Patient is performing her on I&O caths, performing her on Lovenox injections. Family has been in for family education. Patient is on target for discharge.  *See Care Plan and progress notes for long and short-term goals.   Revisions to Treatment Plan:  none    Medical Summary Current Status: cathing- bladder- bowel continent; need red rubber caths- need teaching on lovenox Weekly Focus/Goal: PT- CGA-min A- S w/c- family ed; 10 ft walking parallel bars  Barriers to Discharge: IV antibiotics;Decreased family/caregiver support;Home enviroment access/layout;Neurogenic Bowel & Bladder;Weight;Incontinence  Barriers to Discharge Comments: d/c 7/24 vs  7/25 based on IV ABX finish Possible Resolutions to Barriers: OT- on target for goals by d/c- family ed done   Continued Need for Acute Rehabilitation Level of Care: The patient requires daily medical management by a physician with specialized training in physical medicine and rehabilitation for the following reasons: Direction of a multidisciplinary physical  rehabilitation program to maximize functional independence : Yes Medical management of patient stability for increased activity during participation in an intensive rehabilitation regime.: Yes Analysis of laboratory values and/or radiology reports with any subsequent need for medication adjustment and/or medical intervention. : Yes   I attest that I was present, lead the team conference, and concur with the assessment and plan of the team.   Tennis Must 05/30/2020, 3:47 PM

## 2020-05-30 NOTE — Progress Notes (Signed)
Patient ID: [name], female   DOB: August 05, 1961, 59 y.o.   MRN: [id number]  SW waiting on updates from [facility] about HHPT/OT charity referral. SW waiting on follow-up.  *referral accepted. SW informed will follow-up on if SN is needed.   SW received updates PA, Pam Love indicating family had questions about Medicaid. SW met with pt husband and sister in room to discuss there will be follow-up on if pt will qualify for disability after team conference.   Wheelchair ordered through [employer] via parachute.   Team conference updates include change in d/c date to 7/25 to allow last dose of IV abx to be completed. Attending supports pt will have a disability. SW spoke with [place]/financial counseling [phone]) to inform on updates, and she will begin processing pt Medicaid application. SW informed on pt d/c date.   SW called pt husband Debra Schmidt [phone]) to confirm disability will be supported and financial counseling will follow-up with him to discuss Medicaid application. SW informed on changes in pt d/c date, [facility]) and [employer] to follow-up about DME. Pt husband reports he has received a phone call from [facility] already with regard to care, and [facility] about w/c. SW also informed pt will d/c with catheters and will be provided samples at d/c. SW met with pt in room to inform on change in d/c date and above details. Pt reported no concerns.   Sw spoke with [facility] and [facility] [phone]) to schedule hospital follow- Friday August 13 at 2:30pm with Dr. Karle Plumber, and new patient appointment on Thursday, September 9 at 3:30pm with Dr. Karle Plumber.    SW received updates from [employer] who will have customer service reach out to the husband to discuss mailing out sample catheters.   Loralee Pacas, MSW, [place] Office: [phone] Cell: [phone] Fax: [phone]

## 2020-05-30 NOTE — Progress Notes (Addendum)
[place] PHYSICAL MEDICINE & REHABILITATION PROGRESS NOTE   S:   Pt said only asking for pain meds 2x/day usually.   Can't see the muscle spasms in legs, but still has in calves.    ROS:   Pt denies SOB, abd pain, CP, N/V/C/D, and vision changes   Objective:   No results found. Recent Labs    05/29/20 0346  WBC 3.8*  HGB 9.6*  HCT 30.8*  PLT 375   Recent Labs    05/29/20 0346  NA 138  K 3.9  CL 101  CO2 28  GLUCOSE 112*  BUN 10  CREATININE 0.58  CALCIUM 10.0    Intake/Output Summary (Last 24 hours) at 05/30/2020 1006 Last data filed at 05/30/2020 0739 Gross per 24 hour  Intake 187 ml  Output 2411 ml  Net -2224 ml    Physical Exam: Vital Signs Blood pressure 114/78, pulse 86, temperature 97.6 F (36.4 C), temperature source Oral, resp. rate 17, height 5' 4"  (1.626 m), weight 56 kg, SpO2 97 %. Constitutional: No distress . Vital signs reviewed.sitting up in bed; appropriate, NAD HEENT: EOMI, oral membranes moist Neck: supple Cardiovascular: RRR Respiratory/Chest: CTA B/L- no W/R/R- good air movement GI/Abdomen: Soft, NT, ND, (+)BS   Ext: no clubbing, cyanosis, or edema Psych: appropriate Skin: Incision is clean dry and intact. Healing well. Back incision healed, R shoulder has 1 scab- looks good Musc: No edema in extremities.  No tenderness in extremities. Neuro: Alert- Motor: Bilateral upper extremities: 4+/5 proximal to distal  Bilateral lower extremities: 4 -/5 proximal to distal   Assessment/Plan: 1. 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  Physiatrist is providing close team supervision and 24 hour management of active medical problems listed below.  Physiatrist and rehab team continue to assess barriers to discharge/monitor patient progress toward functional and  medical goals  Care Tool:  Bathing    Body parts bathed by patient: Chest, Abdomen, Front perineal area, Right upper leg, Left upper leg, Face   Body parts bathed by helper: Buttocks, Right arm, Left arm, Right lower leg, Left lower leg     Bathing assist Assist Level: Moderate Assistance - Patient 50 - 74%     Upper Body Dressing/Undressing Upper body dressing   What is the patient wearing?: Button up shirt    Upper body assist Assist Level: Moderate Assistance - Patient 50 - 74%    Lower Body Dressing/Undressing Lower body dressing      What is the patient wearing?: Pants     Lower body assist Assist for lower body dressing: Minimal Assistance - Patient > 75%     Toileting Toileting    Toileting assist Assist for toileting: Maximal Assistance - Patient 25 - 49%     Transfers Chair/bed transfer  Transfers assist     Chair/bed transfer assist level: Contact Guard/Touching assist     Locomotion Ambulation   Ambulation assist      Assist level: Contact Guard/Touching assist Assistive device: Parallel bars Max distance: 10 feet   Walk 10 feet activity   Assist     Assist level: Contact Guard/Touching assist Assistive device: Parallel bars   Walk 50 feet activity   Assist Walk 50 feet with 2 turns activity did not occur: Safety/medical concerns         Walk 150 feet activity   Assist  Walk 150 feet activity did not occur: Safety/medical concerns         Walk 10 feet on uneven surface  activity   Assist Walk 10 feet on uneven surfaces activity did not occur: Safety/medical concerns         Wheelchair     Assist Will patient use wheelchair at discharge?: Yes Type of Wheelchair: Manual    Wheelchair assist level: Supervision/Verbal cueing Max wheelchair distance: 250 feet    Wheelchair 50 feet with 2 turns activity    Assist        Assist Level: Supervision/Verbal cueing   Wheelchair 150 feet activity      Assist      Assist Level: Supervision/Verbal cueing   Blood pressure 114/78, pulse 86, temperature 97.6 F (36.4 C), temperature source Oral, resp. rate 17, height 5' 4"  (1.626 m), weight 56 kg, SpO2 97 %.  Medical Problem List and Plan: 1.  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  Antithrombotics: -DVT/anticoagulation:  Mechanical: Sequential compression devices, below knee Bilateral lower extremities  7/15: Continue Lovenox 2 months from surgery- if at all possible to reduce risk of DVT/PE  - Dopplers negative.              -antiplatelet therapy: N/A 3. Pain Management/neuropathic pain: OxyContin 20 mg bid with oxycodone prn.   Robaxin   Scheduled Tylenol.   Valium   Started Gabapentin 378m TID for left sided sciatic pain that is currently most severe to her  Oxycontin to 291mq12H.   Flector patch to paraspinal area  7/18 pain seems controlled  7/19- doing much better- having calves cramps- ACE wraps help   7/20- no change- con't regimen 4. Mood: LCSW to follow for evaluation and support.              -antipsychotic agents:  N/A 5. Neuropsych: This patient is capable of making decisions on her own behalf. 6. Skin/Wound Care: incisions healed Continue Juven to promote wound healing.  7. Fluids/Electrolytes/Nutrition: Monitor I/O. Start calcium supplement for hypocalcemia. 7/16 BMP is otherwise stable.  8. Disseminated MSSA infection with osteomyelitis, epidural abscess and R septic shoulder:  Cefepime X 6 weeks with weekly CBC/BMP/ESR/CRP-end date 06/03/20.  9. HTN: Monitor BP.   Stoped Amlodipine.   Stoped Lisinopril.  Vitals:   05/29/20 1933 05/30/20 0327  BP: 110/78 114/78  Pulse: 94 86  Resp: 18 17  Temp: 97.7 F (36.5 C) 97.6 F (36.4 C)  SpO2: 99% 97%   Stoped  Flomax.   7/13- BP good- off flomax- - can't restart flomax  7/20- went over with pt- not on BP meds- con't regimen 10. Epidural abscess with diskitis: On IV antibiotics end date 06/03/20  11. Chronic pancreatitis: Abdominal symptoms have resolved. To follow with [facility] for Lap chole on outpatient basis.  12. Hypokalemia: Hypomagnesemia has resolved with supplement. Will continue to keep Kdur at 40 meq bid as tends to drop when decreased once a day.   Potassium 4.0 on 7/9  7/19- K+ 3.9 13. Acute on chronic anemia: H/H stable.   Hemoglobin 8.9 on 7/14 14. Neurogenic bladder/Urinary retention:   low dose urecholine 1073mID, increased to 25 on 7/10  Continues to retain, consider further adjustments as necessary  7/12- teach in/out caths- agree with pt.  7/14-18: continue I/O teaching, still retaining  urine  7/19- learning in/out caths still 15. Neurogenic bowel-   Improving  16. 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 Dr. Leonel Ramsay does not recommend LP due to discitis but questioned subacute stroke. Dr. Linus Salmons does not feel septic emboli is cause of changes and recommends no change in antibiotic regimen.   HIV ordered and nonreactive. 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 ID  Discussed nonreactive treponema pallidum test with patient.   7/12- pt able to think of learning in/out caths on her own- thinks it's heading/improving? 17. Dispo  7/13-   extend pt to next week.  7/19- d/c 7/24 after last dose of IV ABX   7/20- talked with husband about that pt is appropriate for disability at this time- will need a lot of help when discharged.    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   She is unable to walk with an assistive device at this time- is taking 10 steps MAXIMUM in the parallel bars at  this time and has neurogenic bowel and bladder- she requires PO bowel meds for bowel and in/out catheterizations to empty her bladder.  She might conitnue to improve, but it will be months to years until she's able to get around without a manual w/c, and I'm not sure if we can say that, guarantee that for her future.  I have discussed her prognosis with pt and husband- as well as SW.     LOS: 21 days A FACE TO FACE EVALUATION WAS PERFORMED  Milo Schreier 05/30/2020, 10:06 AM

## 2020-05-30 NOTE — Progress Notes (Signed)
Physical Therapy Note  Patient Details  Name: Debra Schmidt MRN: [id number] Date of Birth: June 12, 1961 Today's Date: 05/30/2020    Recommending the following equipment to increase functional independence with mobility: Standard lightweight manual wheelchair 16x16 hemi-height with standard leg rests   Patient measurements:    Hip Width: 15"    Seat Depth (-2"): 18"    Back Height: N/A   W/c cushion: standard foam    Peter Congo, PT, DPT  05/30/2020, 11:26 AM

## 2020-05-31 ENCOUNTER — Inpatient Hospital Stay (HOSPITAL_COMMUNITY): Payer: Self-pay | Admitting: Physical Therapy

## 2020-05-31 ENCOUNTER — Inpatient Hospital Stay (HOSPITAL_COMMUNITY): Payer: Self-pay

## 2020-05-31 ENCOUNTER — Ambulatory Visit: Payer: Self-pay | Admitting: Internal Medicine

## 2020-05-31 LAB — BASIC METABOLIC PANEL
Anion gap: 9 (ref 5–15)
BUN: 9 mg/dL (ref 6–20)
CO2: 28 mmol/L (ref 22–32)
Calcium: 10.2 mg/dL (ref 8.9–10.3)
Chloride: 101 mmol/L (ref 98–111)
Creatinine, Ser: 0.57 mg/dL (ref 0.44–1.00)
GFR calc Af Amer: >60 mL/min (ref >60)
GFR calc non Af Amer: >60 mL/min (ref >60)
Glucose, Bld: 120 mg/dL — ABNORMAL HIGH (ref 70–99)
Potassium: 4.1 mmol/L (ref 3.5–5.1)
Sodium: 138 mmol/L (ref 135–145)

## 2020-05-31 NOTE — Progress Notes (Signed)
[place] PHYSICAL MEDICINE & REHABILITATION PROGRESS NOTE   S:   Pt reports still having calves muscle spasms- like muscle soreness after working out.   Discussed with pt and RN, need to teach her to cath with red rubber catheter since not disposable- can continue to use until get medicaid-   Explained this to pt and RN. ROS:   Pt denies SOB, abd pain, CP, N/V/C/D, and vision changes    Objective:   No results found. Recent Labs    05/29/20 0346  WBC 3.8*  HGB 9.6*  HCT 30.8*  PLT 375   Recent Labs    05/29/20 0346 05/31/20 0355  NA 138 138  K 3.9 4.1  CL 101 101  CO2 28 28  GLUCOSE 112* 120*  BUN 10 9  CREATININE 0.58 0.57  CALCIUM 10.0 10.2    Intake/Output Summary (Last 24 hours) at 05/31/2020 2021 Last data filed at 05/31/2020 1900 Gross per 24 hour  Intake 714 ml  Output 1600 ml  Net -886 ml    Physical Exam: Vital Signs Blood pressure 108/72, pulse 97, temperature (!) 97.4 F (36.3 C), temperature source Oral, resp. rate 19, height _0  (1.626 m), weight 56 kg, SpO2 97 %. Constitutional: No distress . Vital signs reviewed.sitting up in bed- brighter affect, <RN at bedside, NAD HEENT: EOMI, oral membranes moist Neck: supple Cardiovascular: RRR Respiratory/Chest: CTA B/L- no W/R/R- good air movement GI/Abdomen: Soft, NT, ND, (+)BS   Ext: no clubbing, cyanosis, or edema Psych: appropriate- talkative- understanding about red rubber caths Skin: Incision is clean dry and intact. Healing well. Back incision healed, R shoulder has 1 scab- looks good Musc: No edema in extremities.  No tenderness in extremities. Neuro: Alert- Motor: Bilateral upper extremities: 4+/5 proximal to distal  Bilateral lower extremities: 4 -/5 proximal to distal   Assessment/Plan: 1. 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  Physiatrist is providing close team supervision and 24 hour management of active medical problems listed below.  Physiatrist and rehab team continue to assess barriers to discharge/monitor patient progress toward functional and medical goals  Care Tool:  Bathing    Body parts bathed by patient: Right arm, Left arm, Chest, Abdomen, Front perineal area, Right upper leg, Left upper leg, Face   Body parts bathed by helper: Buttocks, Right lower leg, Left lower leg     Bathing assist Assist Level: Minimal Assistance - Patient > 75%     Upper Body Dressing/Undressing Upper body dressing   What is the patient wearing?: Pull over shirt    Upper body assist Assist Level: Minimal Assistance - Patient > 75%    Lower Body Dressing/Undressing Lower body dressing      What is the patient wearing?: Pants     Lower body assist Assist for lower body dressing: Moderate Assistance - Patient 50 - 74%     Toileting Toileting    Toileting assist Assist for toileting: Moderate Assistance - Patient 50 - 74%     Transfers Chair/bed transfer  Transfers assist     Chair/bed transfer assist level: Minimal Assistance - Patient > 75%     Locomotion Ambulation   Ambulation assist      Assist level: Contact Guard/Touching assist Assistive device: Parallel bars Max distance: 6 ft   Walk 10 feet activity   Assist  Assist level: Contact Guard/Touching assist Assistive device: Parallel bars   Walk 50 feet activity   Assist Walk 50 feet with 2 turns activity did not occur: Safety/medical concerns         Walk 150 feet activity   Assist Walk 150 feet activity did not occur: Safety/medical concerns         Walk 10 feet on uneven surface  activity   Assist Walk 10 feet on uneven surfaces activity did not occur: Safety/medical concerns         Wheelchair     Assist Will patient use wheelchair at discharge?: Yes Type of  Wheelchair: Manual    Wheelchair assist level: Supervision/Verbal cueing Max wheelchair distance: 50 feet    Wheelchair 50 feet with 2 turns activity    Assist        Assist Level: Supervision/Verbal cueing   Wheelchair 150 feet activity     Assist      Assist Level: Supervision/Verbal cueing   Blood pressure 108/72, pulse 97, temperature (!) 97.4 F (36.3 C), temperature source Oral, resp. rate 19, height _0  (1.626 m), weight 56 kg, SpO2 97 %.  Medical Problem List and Plan: 1.  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  Antithrombotics: -DVT/anticoagulation:  Mechanical: Sequential compression devices, below knee Bilateral lower extremities  7/15: Continue Lovenox 2 months from surgery- if at all possible to reduce risk of DVT/PE  - Dopplers negative.              -antiplatelet therapy: N/A 3. Pain Management/neuropathic pain: OxyContin 20 mg bid with oxycodone prn.   Robaxin   Scheduled Tylenol.   Valium   Started Gabapentin 373m TID for left sided sciatic pain that is currently most severe to her  Oxycontin to 236mq12H.   Flector patch to paraspinal area  7/18 pain seems controlled  7/19- doing much better- having calves cramps- ACE wraps help   7/20- no change- con't regimen 4. Mood: LCSW to follow for evaluation and support.              -antipsychotic agents:  N/A 5. Neuropsych: This patient is capable of making decisions on her own behalf. 6. Skin/Wound Care: incisions healed Continue Juven to promote wound healing.  7. Fluids/Electrolytes/Nutrition: Monitor I/O. Start calcium supplement for hypocalcemia. 7/16 BMP is otherwise stable.  8. Disseminated MSSA infection with osteomyelitis, epidural abscess and R septic shoulder:  Cefepime X 6 weeks with weekly  CBC/BMP/ESR/CRP-end date 06/03/20.  9. HTN: Monitor BP.   Stoped Amlodipine.   Stoped Lisinopril.  Vitals:   05/31/20 1324 05/31/20 1957  BP: 117/82 108/72  Pulse: (!) 106 97  Resp: 20 19  Temp: 97.7 F (36.5 C) (!) 97.4 F (36.3 C)  SpO2: 97% 97%   Stoped Flomax.   7/13- BP good- off flomax- - can't restart flomax  7/20- went over with pt- not on BP meds- con't regimen 10. Epidural abscess with diskitis: On IV antibiotics end date 06/03/20  11. Chronic pancreatitis: Abdominal symptoms have resolved. To follow with [facility] for Lap chole on outpatient basis.  12. Hypokalemia: Hypomagnesemia has resolved with supplement. Will continue to keep Kdur at 40 meq bid as tends to drop when decreased once a day.   Potassium 4.0 on 7/9  7/19- K+ 3.9  7/21- K+ 4.1- con't  regimen 13. Acute on chronic anemia: H/H stable.   Hemoglobin 8.9 on 7/14 14. Neurogenic bladder/Urinary retention:   low dose urecholine 31m TID, increased to 25 on 7/10  Continues to retain, consider further adjustments as necessary  7/12- teach in/out caths- agree with pt.  7/14-18: continue I/O teaching, still retaining urine  7/19- learning in/out caths still  7/21- want to to learn to use red rubber caths since can reuse at home- since doesn't have insurance currently. D/w pt and RN 15. Neurogenic bowel-   Improving  16. 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 Dr. KLeonel Ramsaydoes not recommend LP due to discitis but questioned subacute stroke. Dr. CLinus Salmonsdoes not feel septic emboli is cause of changes and recommends no change in antibiotic regimen.   HIV ordered and nonreactive. 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 ID  Discussed nonreactive treponema pallidum test with patient.   7/12- pt able to think of learning in/out caths on her own- thinks it's heading/improving? 17. Dispo  7/13-   extend pt to next week.  7/19-  d/c 7/24 after last dose of IV ABX   7/20- talked with husband about that pt is appropriate for disability at this time- will need a lot of help when discharged.   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   She is unable to walk with an assistive device at this time- is taking 10 steps MAXIMUM in the parallel bars at this time and has neurogenic bowel and bladder- she requires PO bowel meds for bowel and in/out catheterizations to empty her bladder.  She might conitnue to improve, but it will be months to years until she's able to get around without a manual w/c, and I'm not sure if we can say that, guarantee that for her future.  I have discussed her prognosis with pt and husband- as well as SW.     LOS: 22 days A FACE TO FACE EVALUATION WAS PERFORMED  Debra Schmidt 05/31/2020, 8:21 PM

## 2020-05-31 NOTE — Progress Notes (Signed)
Occupational Therapy Session Note  Patient Details  Name: RHYLIE STEHR MRN: [id number] Date of Birth: 1961-05-05  Today's Date: 05/31/2020 OT Individual Time: 0900-1000 OT Individual Time Calculation (min): 60 min    Short Term Goals: Week 3:  OT Short Term Goal 1 (Week 3): STG=LTG secondary to ELOS  Skilled Therapeutic Interventions/Progress Updates:    OT intervention with focus on bed mobility, sitt<>stand, functional transfers, bathing at shower level, and dressing with sit<>stand for clothing management.  See Care Tool for assist levels.  All transfers with min A.  Pt continues to use BUE for support when sitting but was able to use unilateral UE for bathing tasks while other UE provided support. Pt able to cross BLE to bathe. Pt required assistance pulling pants over hips after threading while seated. Pt required min verbal cues to redirect to task during bathing/dressing.  Pt required more then a reasonable amount of time to complete all tasks.  Pt returned to bed and remained in bed with all needs within reach and bed alarm activated.   Therapy Documentation Precautions:  Precautions Precautions: Fall, Back Precaution Booklet Issued: No Precaution Comments:  (Reviewed back precautions) Required Braces or Orthoses: Spinal Brace Spinal Brace: Lumbar corset, Applied in sitting position Restrictions Weight Bearing Restrictions: No RUE Weight Bearing: Weight bearing as tolerated Other Position/Activity Restrictions: no overhead movement RUE   Pain:  Pt c/o increased leg spasms at end of sessin; repositioned   Therapy/Group: Individual Therapy  Rich Brave 05/31/2020, 10:02 AM

## 2020-05-31 NOTE — Plan of Care (Signed)
  Problem: SCI BLADDER ELIMINATION Goal: RH STG MANAGE BLADDER WITH ASSISTANCE Description: STG Manage Bladder With min Assistance Outcome: Progressing Goal: RH STG MANAGE BLADDER WITH MEDICATION WITH ASSISTANCE Description: STG Manage Bladder With Medication With min Assistance. Outcome: Progressing   Problem: RH PAIN MANAGEMENT Goal: RH STG PAIN MANAGED AT OR BELOW PT'S PAIN GOAL Description: <4 on a 0-10 pain scale. Outcome: Progressing

## 2020-05-31 NOTE — Progress Notes (Signed)
Occupational Therapy Session Note  Patient Details  Name: [name] MRN: [id number] Date of Birth: 1961/10/30  Today's Date: 05/31/2020 OT Individual Time: 1345-1425 OT Individual Time Calculation (min): 40 min    Short Term Goals: Week 3:  OT Short Term Goal 1 (Week 3): STG=LTG secondary to ELOS  Skilled Therapeutic Interventions/Progress Updates:    OT intervention with focus on BSC transfers, toileting, bed mobility, sit<>stand, standing balance, and safety awareness to increase independence with BADLs. LSO donned/doffed seated EOB. Pt directs donning and requires assistance. Supine<>sit EOB with min A.  Bed<>BSC transfer with min A. Toileting with mod A.  Sit<>stand with min A and min A for standing balance.  Pt unable to remove BUE from RW to assist with clothing management when standing. Discussed home safety and recommendation for 24 hour assistance/supervision.  Pt remained in bed with all needs within reach and bed alarm activated.   Therapy Documentation Precautions:  Precautions Precautions: Fall, Back Precaution Booklet Issued: No Precaution Comments:  (Reviewed back precautions) Required Braces or Orthoses: Spinal Brace Spinal Brace: Lumbar corset, Applied in sitting position Restrictions Weight Bearing Restrictions: No RUE Weight Bearing: Weight bearing as tolerated Other Position/Activity Restrictions: no overhead movement RUE General:   Vital Signs: Therapy Vitals Temp: 97.7 F (36.5 C) Temp Source: Oral Pulse Rate: (!) 106 Resp: 20 BP: 117/82 Patient Position (if appropriate): Lying Oxygen Therapy SpO2: 97 % O2 Device: Room Air Pain:   ADL:   Vision   Perception    Praxis   Exercises:   Other Treatments:     Therapy/Group: Individual Therapy  Rich Brave 05/31/2020, 2:32 PM

## 2020-05-31 NOTE — Progress Notes (Signed)
Physical Therapy Session Note  Patient Details  Name: Debra Schmidt MRN: [id number] Date of Birth: [birth year]  Today's Date: 05/31/2020 PT Individual Time: 7867-6720 PT Individual Time Calculation (min): 54 min   Short Term Goals: Week 3:  PT Short Term Goal 1 (Week 3): STG = LTG d/t ELOS  Skilled Therapeutic Interventions/Progress Updates:  Pt received in bed & agreeable to tx. Supine>L sidelying with supervision & bed rails, sidelying>sitting with min assist to upright trunk. PT provides total assist for donning LSO with pt sitting EOB & pt performs squat pivot bed>w/c with min assist. Pt propels w/c ~50 ft at a time before low back pain requires her to take a rest break, with pt completing 3-4 trials. Kinetron in sitting for BLE strengthening with cuing for increased speed but pt limited by pain. Sit<>stand in parallel bars with cuing to push up from w/c and CGA, gait x 3 trials x 3 ft forwards + 3 ft backwards with CGA and therapist providing tactile cuing at L knee but no buckling noted. At end of session pt left in w/c with chair alarm donned, call bell in reach, & blanket on BLE for warmth to help reduce pain.  Therapy Documentation Precautions:  Precautions Precautions: Fall, Back Precaution Booklet Issued: No Precaution Comments:  (Reviewed back precautions) Required Braces or Orthoses: Spinal Brace Spinal Brace: Lumbar corset, Applied in sitting position Restrictions Weight Bearing Restrictions: No RUE Weight Bearing: Weight bearing as tolerated Other Position/Activity Restrictions: no overhead movement RUE  Pain: Pt c/o 7-8/10 aching in low back & cramping in L thigh - rest breaks & repositioning provided, pain meds requested at beginning of session but nurse had not administered them by end of session.   Therapy/Group: Individual Therapy  Sandi Mariscal 05/31/2020, 12:16 PM

## 2020-05-31 NOTE — Progress Notes (Signed)
Patient ID: Debra Schmidt, female   DOB: [birth year], 59 y.o.   MRN: [id number]  Met with patient and spoke about her discharge needs. She has an understanding of her In & Out Caths, and her Lovenox injections. She reported to this RN that the nursing staff has been teaching her daily and she has been performing both of these tasks. She is very confident that she can do this at home. She is understanding as to why her discharge date had to be moved to 06/04/20. Patient is in good spirits and ready for discharge.  Dorthula Nettles, RN, BSN, [place] Office [phone] Cell [phone]

## 2020-06-01 ENCOUNTER — Inpatient Hospital Stay (HOSPITAL_COMMUNITY): Payer: Self-pay

## 2020-06-01 ENCOUNTER — Inpatient Hospital Stay (HOSPITAL_COMMUNITY): Payer: Self-pay | Admitting: Physical Therapy

## 2020-06-01 MED ORDER — LIDOCAINE HCL URETHRAL/MUCOSAL 2 % EX GEL: CUTANEOUS | 0 refills | Status: DC | PRN

## 2020-06-01 MED ORDER — JUVEN PO PACK: 1.0000 | PACK | Freq: Two times a day (BID) | ORAL | 0 refills | Status: DC

## 2020-06-01 MED ORDER — BETHANECHOL CHLORIDE 25 MG PO TABS: 25.0000 mg | ORAL_TABLET | Freq: Three times a day (TID) | ORAL | 0 refills | Status: DC

## 2020-06-01 MED ORDER — CYCLOBENZAPRINE HCL 5 MG PO TABS: 5.0000 mg | ORAL_TABLET | Freq: Three times a day (TID) | ORAL | 0 refills | Status: DC

## 2020-06-01 MED ORDER — CEPHALEXIN 500 MG PO CAPS: 500.0000 mg | ORAL_CAPSULE | Freq: Four times a day (QID) | ORAL | 0 refills | Status: DC

## 2020-06-01 MED ORDER — DOCUSATE SODIUM 100 MG PO CAPS: 200.0000 mg | ORAL_CAPSULE | Freq: Every day | ORAL | 0 refills | Status: DC

## 2020-06-01 MED ORDER — PANTOPRAZOLE SODIUM 40 MG PO TBEC: 40.0000 mg | DELAYED_RELEASE_TABLET | Freq: Every day | ORAL | 0 refills | Status: DC

## 2020-06-01 MED ORDER — OXYCODONE HCL ER 10 MG PO T12A: 10.0000 mg | EXTENDED_RELEASE_TABLET | Freq: Two times a day (BID) | ORAL | 0 refills | Status: DC

## 2020-06-01 MED ORDER — MAGNESIUM OXIDE 400 (241.3 MG) MG PO TABS: 200.0000 mg | ORAL_TABLET | Freq: Two times a day (BID) | ORAL | 0 refills | Status: DC

## 2020-06-01 MED ORDER — OXYCODONE HCL 10 MG PO TABS: 5.0000 mg | ORAL_TABLET | Freq: Two times a day (BID) | ORAL | 0 refills | Status: DC | PRN

## 2020-06-01 MED ORDER — BACLOFEN 5 MG PO TABS: 5.0000 mg | ORAL_TABLET | Freq: Every day | ORAL | 0 refills | Status: DC

## 2020-06-01 MED ORDER — DICLOFENAC EPOLAMINE 1.3 % EX PTCH: 1.0000 | MEDICATED_PATCH | Freq: Two times a day (BID) | CUTANEOUS | 0 refills | Status: DC

## 2020-06-01 MED ORDER — POLYETHYLENE GLYCOL 3350 17 G PO PACK: 17.0000 g | PACK | Freq: Every day | ORAL | 0 refills | Status: DC | PRN

## 2020-06-01 MED ORDER — DIAZEPAM 2 MG PO TABS: 1.0000 mg | ORAL_TABLET | Freq: Every day | ORAL | 0 refills | Status: DC

## 2020-06-01 MED ORDER — POTASSIUM CHLORIDE CRYS ER 20 MEQ PO TBCR: 40.0000 meq | EXTENDED_RELEASE_TABLET | Freq: Every day | ORAL | 0 refills | Status: DC

## 2020-06-01 MED ORDER — CYCLOBENZAPRINE HCL 5 MG PO TABS
5.0000 mg | ORAL_TABLET | Freq: Three times a day (TID) | ORAL | Status: DC
  Administered 2020-06-01 – 2020-06-02 (×3): 5 mg via ORAL
  Filled 2020-06-01 (×2): qty 1

## 2020-06-01 MED ORDER — ADULT MULTIVITAMIN W/MINERALS CH: 1.0000 | ORAL_TABLET | Freq: Every day | ORAL | 0 refills | Status: DC

## 2020-06-01 MED ORDER — ENOXAPARIN SODIUM 40 MG/0.4ML ~~LOC~~ SOLN: 40.0000 mg | SUBCUTANEOUS | 0 refills | Status: DC

## 2020-06-01 MED ORDER — CYCLOBENZAPRINE HCL 5 MG PO TABS
5.0000 mg | ORAL_TABLET | Freq: Once | ORAL | Status: AC
  Administered 2020-06-01: 5 mg via ORAL
  Filled 2020-06-01: qty 1

## 2020-06-01 MED FILL — POLYETHYLENE GLYCOL 3350 PO: 17 | 30 days supply | Qty: 510 | Fill #0

## 2020-06-01 MED FILL — ENOXAPARIN SODIUM 40 MG/0.4: 40 | 30 days supply | Qty: 12 | Fill #0

## 2020-06-01 MED FILL — CYCLOBENZAPRINE HCL 5 MG TA: 5 | 30 days supply | Qty: 90 | Fill #0

## 2020-06-01 MED FILL — OxyCONTIN 10 MG T12A: 10 | 14 days supply | Qty: 14 | Fill #0

## 2020-06-01 MED FILL — CEPHALEXIN 500 MG CAPS: 500 | 30 days supply | Qty: 120 | Fill #0

## 2020-06-01 MED FILL — BACLOFEN 10 MG TABS: 10 | 30 days supply | Qty: 15 | Fill #0

## 2020-06-01 MED FILL — PANTOPRAZOLE SOD DR 40 MG T: 40 | 30 days supply | Qty: 30 | Fill #0

## 2020-06-01 MED FILL — oxyCODONE HCL 10 MG TABS: 10 | 7 days supply | Qty: 14 | Fill #0

## 2020-06-01 MED FILL — diazePAM 2 MG TABS: 2 | 30 days supply | Qty: 15 | Fill #0

## 2020-06-01 MED FILL — POTASSIUM CHLORIDE 20meqER: 20 | 30 days supply | Qty: 60 | Fill #0

## 2020-06-01 MED FILL — DOCUSATE SODIUM 100 MG CAPS: 100 | 30 days supply | Qty: 60 | Fill #0

## 2020-06-01 MED FILL — BETHANECHOL 25 MG TABLET: 25 | 30 days supply | Qty: 90 | Fill #0

## 2020-06-01 MED FILL — MAGNESIUM OXIDE 400 MG TABS: 400 | 30 days supply | Qty: 30 | Fill #0

## 2020-06-01 NOTE — Progress Notes (Signed)
Patient ID: Debra Schmidt, female   DOB: Jan 27, 1961, 59 y.o.   MRN: [id number]  Pt set up for [employer] medication assistance program.  Cecile Sheerer, MSW, LCSWA Office: [phone] Cell: [phone] Fax: [phone]

## 2020-06-01 NOTE — Progress Notes (Signed)
[place] PHYSICAL MEDICINE & REHABILITATION PROGRESS NOTE   S:   Pt reports miralax helping to work- peed x1 ~ 30cc- and did 3 more times- more helpful than stool softners; now taking pain meds 3x/day on average- needs to have BM now.    ROS:   Pt denies SOB, abd pain, CP, N/V/C/D, and vision changes   Objective:   No results found. No results for input(s): WBC, HGB, HCT, PLT in the last 72 hours. Recent Labs    05/31/20 0355  NA 138  K 4.1  CL 101  CO2 28  GLUCOSE 120*  BUN 9  CREATININE 0.57  CALCIUM 10.2    Intake/Output Summary (Last 24 hours) at 06/01/2020 0920 Last data filed at 06/01/2020 3474 Gross per 24 hour  Intake 724 ml  Output 1725 ml  Net -1001 ml    Physical Exam: Vital Signs Blood pressure 107/83, pulse 97, temperature 98.5 F (36.9 C), temperature source Oral, resp. rate 16, height '5\' 4"'$  (1.626 m), weight 56 kg, SpO2 96 %. Constitutional: No distress . Vital signs reviewed. Sitting up in bed- appropriate, NAD HEENT: EOMI, oral membranes moist Neck: supple Cardiovascular: RRR Respiratory/Chest: CTA B/L- no W/R/R- good air movement GI/Abdomen: Soft, NT, ND, (+)BS  Ext: no clubbing, cyanosis, or edema Psych: appropriate Skin: Incision is clean dry and intact. Healing well. Back incision healed, R shoulder has 1 scab- looks good Musc: No edema in extremities.  No tenderness in extremities. Neuro: Alert- Motor: Bilateral upper extremities: 4+/5 proximal to distal  Bilateral lower extremities: 4 -/5 proximal to distal   Assessment/Plan: 1. 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  Physiatrist is providing close team supervision and 24 hour management of active medical problems listed below.  Physiatrist and rehab team continue to assess barriers to discharge/monitor  patient progress toward functional and medical goals  Care Tool:  Bathing    Body parts bathed by patient: Right arm, Left arm, Chest, Abdomen, Front perineal area, Right upper leg, Left upper leg, Face   Body parts bathed by helper: Buttocks, Right lower leg, Left lower leg     Bathing assist Assist Level: Minimal Assistance - Patient > 75%     Upper Body Dressing/Undressing Upper body dressing   What is the patient wearing?: Pull over shirt    Upper body assist Assist Level: Minimal Assistance - Patient > 75%    Lower Body Dressing/Undressing Lower body dressing      What is the patient wearing?: Pants     Lower body assist Assist for lower body dressing: Moderate Assistance - Patient 50 - 74%     Toileting Toileting    Toileting assist Assist for toileting: Moderate Assistance - Patient 50 - 74%     Transfers Chair/bed transfer  Transfers assist     Chair/bed transfer assist level: Minimal Assistance - Patient > 75%     Locomotion Ambulation   Ambulation assist      Assist level: Contact Guard/Touching assist Assistive device: Parallel bars Max distance: 6 ft   Walk 10 feet activity   Assist     Assist level: Contact Guard/Touching assist Assistive device: Parallel bars   Walk 50 feet activity   Assist Walk 50 feet with 2 turns activity did not occur: Safety/medical concerns         Walk 150 feet activity  Assist Walk 150 feet activity did not occur: Safety/medical concerns         Walk 10 feet on uneven surface  activity   Assist Walk 10 feet on uneven surfaces activity did not occur: Safety/medical concerns         Wheelchair     Assist Will patient use wheelchair at discharge?: Yes Type of Wheelchair: Manual    Wheelchair assist level: Supervision/Verbal cueing Max wheelchair distance: 50 feet    Wheelchair 50 feet with 2 turns activity    Assist        Assist Level: Supervision/Verbal cueing    Wheelchair 150 feet activity     Assist      Assist Level: Supervision/Verbal cueing   Blood pressure 107/83, pulse 97, temperature 98.5 F (36.9 C), temperature source Oral, resp. rate 16, height '5\' 4"'$  (1.626 m), weight 56 kg, SpO2 96 %.  Medical Problem List and Plan: 1.  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  Antithrombotics: -DVT/anticoagulation:  Mechanical: Sequential compression devices, below knee Bilateral lower extremities  7/15: Continue Lovenox 2 months from surgery- if at all possible to reduce risk of DVT/PE  7/22- will need at d/c.   - Dopplers negative.              -antiplatelet therapy: N/A 3. Pain Management/neuropathic pain: OxyContin 20 mg bid with oxycodone prn.   Robaxin   Scheduled Tylenol.   Valium   Started Gabapentin '300mg'$  TID for left sided sciatic pain that is currently most severe to her  Oxycontin to '20mg'$  q12H.   Flector patch to paraspinal area  7/18 pain seems controlled  7/19- doing much better- having calves cramps- ACE wraps help   7/20- no change- con't regimen 4. Mood: LCSW to follow for evaluation and support.              -antipsychotic agents:  N/A 5. Neuropsych: This patient is capable of making decisions on her own behalf. 6. Skin/Wound Care: incisions healed Continue Juven to promote wound healing.  7. Fluids/Electrolytes/Nutrition: Monitor I/O. Start calcium supplement for hypocalcemia. 7/16 BMP is otherwise stable.  8. Disseminated MSSA infection with osteomyelitis, epidural abscess and R septic shoulder:  Cefepime X 6 weeks with weekly CBC/BMP/ESR/CRP-end date 06/03/20.  9. HTN: Monitor BP.   Stoped Amlodipine.   Stoped Lisinopril.  Vitals:   05/31/20 1957 06/01/20 0342  BP: 108/72 107/83  Pulse: 97 97  Resp: 19 16  Temp: (!)  97.4 F (36.3 C) 98.5 F (36.9 C)  SpO2: 97% 96%   Stoped Flomax.   7/13- BP good- off flomax- - can't restart flomax  7/22- BP controlled- con't regimen 10. Epidural abscess with diskitis: On IV antibiotics end date 06/03/20  11. Chronic pancreatitis: Abdominal symptoms have resolved. To follow with [facility] for Lap chole on outpatient basis.  12. Hypokalemia: Hypomagnesemia has resolved with supplement. Will continue to keep Kdur at 40 meq bid as tends to drop when decreased once a day.   Potassium 4.0 on 7/9  7/19- K+ 3.9  7/21- K+ 4.1- con't regimen 13. Acute on chronic anemia: H/H stable.   Hemoglobin 8.9 on 7/14 14. Neurogenic bladder/Urinary retention:   low dose urecholine '10mg'$  TID, increased to 25 on 7/10  Continues to retain, consider further adjustments as necessary  7/12- teach in/out caths- agree  with pt.  7/14-18: continue I/O teaching, still retaining urine  7/19- learning in/out caths still  7/21- want to to learn to use red rubber caths since can reuse at home- since doesn't have insurance currently. D/w pt and RN  7/22- con't cathing- but has now voided a few time very small amounts <30cc  15. Neurogenic bowel-   Improving   7/22- going to have BM this AM- any minute 16. 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 Dr. Leonel Ramsay does not recommend LP due to discitis but questioned subacute stroke. Dr. Linus Salmons does not feel septic emboli is cause of changes and recommends no change in antibiotic regimen.   HIV ordered and nonreactive. 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 ID  Discussed nonreactive treponema pallidum test with patient.   7/12- pt able to think of learning in/out caths on her own- thinks it's heading/improving? 17. Dispo  7/13-   extend pt to next week.  7/19- d/c 7/24 after last dose of IV ABX   7/20- talked with husband about that pt is appropriate for  disability at this time- will need a lot of help when discharged.   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   She is unable to walk with an assistive device at this time- is taking 10 steps MAXIMUM in the parallel bars at this time and has neurogenic bowel and bladder- she requires PO bowel meds for bowel and in/out catheterizations to empty her bladder.  She might conitnue to improve, but it will be months to years until she's able to get around without a manual w/c, and I'm not sure if we can say that, guarantee that for her future.  I have discussed her prognosis with pt and husband- as well as SW.     LOS: 23 days A FACE TO FACE EVALUATION WAS PERFORMED  Aedan Geimer 06/01/2020, 9:20 AM

## 2020-06-01 NOTE — Progress Notes (Signed)
Physical Therapy Note  Patient Details  Name: Debra Schmidt MRN: [id number] Date of Birth: 08/18/1961 Today's Date: 06/01/2020    1355: Attempted to see patient for PT session. Patient reported NT bringing equipment to perform in/out catheterization. Due to short length of session, PT to attempt session later this afternoon.   1535: Patient asleep in her room, easily aroused to verbal stimuli. Reporting increased pain earlier and finally getting comfortable enough to sleep. Patient missed 30 min of skilled PT due to fatigue/nursing care, RN made aware. Will attempt to make-up missed time as able.      Adil Tugwell L Jalea Bronaugh PT, DPT  06/01/2020, 4:37 PM

## 2020-06-01 NOTE — Progress Notes (Signed)
Physical Therapy Session Note  Patient Details  Name: VEE BAHE MRN: [id number] Date of Birth: 05/06/1961  Today's Date: 06/01/2020 PT Individual Time: 1000-1100 PT Individual Time Calculation (min): 60 min   Short Term Goals: Week 3:  PT Short Term Goal 1 (Week 3): STG = LTG d/t ELOS  Skilled Therapeutic Interventions/Progress Updates:  Pt received supine in bed in room. Agreeable to therapy. Denies any pain at rest prior to start of therapy session. Pt did experience occasional LLE "cramps" throughout session, but pain resolved with positioning and frequent rest breaks. ACE wraps in place to B/L thighs. Supine to sit at EOB with min A. Lumbar corset donned with pt seated at EOB by therapist. Squat pivot transfer from EOB to W/C with CGA. Verbal cues required for correct hand placement while performing SPT. Dependent W/C transfer to therapy gym for time conservation. Pt performed sit <> stand with RW and CGA throughout session. Ambulated x10 feet, x40 feet, x40 feet with RW, min A, and close W/C follow. Pt demonstrates a narrow BOS and decreased step length during gait. Advised pt that despite her progress today, it is unsafe for her to ambulate alone or with family as she is at increased risk for falls. Pt understands and agrees that she will only perform gait with HHPT upon discharge. Pt also able to verbalize spinal precautions. Seated in the W/C, performed sit <> stand x5 reps with RW and CGA. Pt propelled W/C 75 feet to room using BUEs with close supervision overall (occasional min A needed for steering). Squat pivot transfer from W/C to EOB with CGA. Lumbar corset doffed with pt seated at EOB by therapist. Sit to supine in bed with mod A d/t LE management. Pt left supine in bed with all needs in reach. All questions answered.  Therapy Documentation Precautions:  Precautions Precautions: Fall, Back Precaution Booklet Issued: No Precaution Comments:  (Reviewed back  precautions) Required Braces or Orthoses: Spinal Brace Spinal Brace: Lumbar corset, Applied in sitting position Restrictions Weight Bearing Restrictions: No RUE Weight Bearing: Weight bearing as tolerated Other Position/Activity Restrictions: no overhead movement RUE  Therapy/Group: Individual Therapy  Murrell Redden, SPT 06/01/2020, 11:56 AM

## 2020-06-01 NOTE — Plan of Care (Signed)
  Problem: SCI BOWEL ELIMINATION Goal: RH STG MANAGE BOWEL WITH ASSISTANCE Description: STG Manage Bowel with max/total Assistance. Outcome: Progressing Goal: RH STG SCI MANAGE BOWEL WITH MEDICATION WITH ASSISTANCE Description: STG SCI Manage bowel with medication with max/total assistance. Outcome: Progressing Goal: RH STG SCI MANAGE BOWEL PROGRAM W/ASSIST OR AS APPROPRIATE Description: STG SCI Manage bowel program w/max/total assist or as appropriate. Outcome: Progressing

## 2020-06-01 NOTE — Progress Notes (Signed)
Occupational Therapy Session Note  Patient Details  Name: Debra Schmidt MRN: [id number] Date of Birth: September 06, 1961  Today's Date: 06/01/2020 OT Individual Time: 0700-0810 OT Individual Time Calculation (min): 70 min    Short Term Goals: Week 3:  OT Short Term Goal 1 (Week 3): STG=LTG secondary to ELOS  Skilled Therapeutic Interventions/Progress Updates:    OT intervention with focus on bed mobility, BLE stretching, and sitting balance.  Pt with increased BLE spasms this morning and focus on relief to engage in functional tasks. Supine>sit EOB with min A.  Sitting balance with supervisoin and BUE support.  Pt continues to require UE support when sitting unsupported. Continued with discharge planning.  Pt with anxiety regarding discharge home even though she is looking forward.  Emotional support and therapeutic listening provided. Sit>supine with min A. Rolling in bed with supervisoin using bed rails. Pt stated spasm in BLE decreased during activity. Pt remained in bed with all needs within reach and bed alarm activated.   Therapy Documentation Precautions:  Precautions Precautions: Fall, Back Precaution Booklet Issued: No Precaution Comments:  (Reviewed back precautions) Required Braces or Orthoses: Spinal Brace Spinal Brace: Lumbar corset, Applied in sitting position Restrictions Weight Bearing Restrictions: No RUE Weight Bearing: Weight bearing as tolerated Other Position/Activity Restrictions: no overhead movement RUE   Pain: Pt c/o BLE (thigh and groin) spasms; stretching and heat  Therapy/Group: Individual Therapy  Rich Brave 06/01/2020, 9:32 AM

## 2020-06-02 ENCOUNTER — Inpatient Hospital Stay (HOSPITAL_COMMUNITY): Payer: Self-pay | Admitting: Physical Therapy

## 2020-06-02 ENCOUNTER — Inpatient Hospital Stay (HOSPITAL_COMMUNITY): Payer: Self-pay

## 2020-06-02 LAB — BASIC METABOLIC PANEL
Anion gap: 9 (ref 5–15)
BUN: 14 mg/dL (ref 6–20)
CO2: 28 mmol/L (ref 22–32)
Calcium: 10 mg/dL (ref 8.9–10.3)
Chloride: 100 mmol/L (ref 98–111)
Creatinine, Ser: 0.59 mg/dL (ref 0.44–1.00)
GFR calc Af Amer: >60 mL/min (ref >60)
GFR calc non Af Amer: >60 mL/min (ref >60)
Glucose, Bld: 105 mg/dL — ABNORMAL HIGH (ref 70–99)
Potassium: 4.1 mmol/L (ref 3.5–5.1)
Sodium: 137 mmol/L (ref 135–145)

## 2020-06-02 MED ORDER — CYCLOBENZAPRINE HCL 5 MG PO TABS
5.0000 mg | ORAL_TABLET | Freq: Once | ORAL | Status: AC
  Administered 2020-06-02: 5 mg via ORAL
  Filled 2020-06-02: qty 1

## 2020-06-02 MED ORDER — CYCLOBENZAPRINE HCL 10 MG PO TABS
10.0000 mg | ORAL_TABLET | Freq: Three times a day (TID) | ORAL | Status: DC
  Administered 2020-06-02 – 2020-06-04 (×6): 10 mg via ORAL
  Filled 2020-06-02 (×6): qty 1

## 2020-06-02 MED ORDER — CYCLOBENZAPRINE HCL 10 MG PO TABS: 10.0000 mg | ORAL_TABLET | Freq: Three times a day (TID) | ORAL | 0 refills | Status: DC

## 2020-06-02 MED FILL — CYCLOBENZAPRINE 10 MG TAB: 10 | 30 days supply | Qty: 90 | Fill #0

## 2020-06-02 MED FILL — DICLOFENAC EPOLAMINE 1.3 %: 1.3 | 30 days supply | Qty: 60 | Fill #0

## 2020-06-02 NOTE — Progress Notes (Signed)
Occupational Therapy Session Note  Patient Details  Name: Debra Schmidt MRN: [id number] Date of Birth: 1961/10/28  Today's Date: 06/02/2020 OT Individual Time: 1000-1030 OT Individual Time Calculation (min): 30 min  and Today's Date: 06/02/2020 OT Missed Time: 30 Minutes Missed Time Reason: Nursing care   Short Term Goals: Week 3:  OT Short Term Goal 1 (Week 3): STG=LTG secondary to ELOS  Skilled Therapeutic Interventions/Progress Updates:    Pt missed 30 mins skilled OT services to allow time for pt to perform self cath with RN present. OT intervention with focus on bed mobility, sit<>stand, and standing balance. Supine>sit EOB with supervision without bed rails. Sitting balance with supervision while LSO donned dependently. Sit<>stand X 5 with supervisoin. Standing balance with min A for approx 30 secs each trial. Pt returned to bed and remained in bed with all needs within reach and bed alarm activated.   Therapy Documentation Precautions:  Precautions Precautions: Fall, Back Precaution Booklet Issued: No Precaution Comments:  (Reviewed back precautions) Required Braces or Orthoses: Spinal Brace Spinal Brace: Lumbar corset, Applied in sitting position Restrictions Weight Bearing Restrictions: No RUE Weight Bearing: Weight bearing as tolerated Other Position/Activity Restrictions: no overhead movement RUE General: General OT Amount of Missed Time: 30 Minutes   Pain: Pt states her pain is "manageable"  Therapy/Group: Individual Therapy  Rich Brave 06/02/2020, 10:28 AM

## 2020-06-02 NOTE — Progress Notes (Signed)
[place] PHYSICAL MEDICINE & REHABILITATION PROGRESS NOTE   S:   Pt reports some screws missing from [facility]- needs it fixed per PT.  Also flexeril increase helped muscle spasms- on 5 mg- will change to 10 mg prn TID-  Peed 3x yesterday- small amounts- didn't empty- used urinal.   Couldn't complete therapy this AM due to muscle spasms pain.    ROS:   Pt denies SOB, abd pain, CP, N/V/C/D, and vision changes   Objective:   No results found. No results for input(s): WBC, HGB, HCT, PLT in the last 72 hours. Recent Labs    05/31/20 0355 06/02/20 0433  NA 138 137  K 4.1 4.1  CL 101 100  CO2 28 28  GLUCOSE 120* 105*  BUN 9 14  CREATININE 0.57 0.59  CALCIUM 10.2 10.0    Intake/Output Summary (Last 24 hours) at 06/02/2020 0940 Last data filed at 06/02/2020 0730 Gross per 24 hour  Intake 970 ml  Output 1580 ml  Net -610 ml    Physical Exam: Vital Signs Blood pressure 110/80, pulse 93, temperature (!) 97.5 F (36.4 C), temperature source Oral, resp. rate 16, height 5' 4"  (1.626 m), weight 56 kg, SpO2 97 %. Constitutional: No distress . Vital signs reviewed. Laying in bed- appropriate, but clutching legs due ot muscle spasms, NAD HEENT: EOMI, oral membranes moist Neck: supple Cardiovascular: RRR Respiratory/Chest: CTA B/L- no W/R/R- good air movement GI/Abdomen: Soft, NT, ND, (+)BS   Ext: no clubbing, cyanosis, or edema Psych: appropriate Skin: Incision is clean dry and intact. Healing well. Back incision healed, R shoulder has 1 scab- looks good- no change Musc: No edema in extremities.  No tenderness in extremities. Neuro: Alert- Motor: Bilateral upper extremities: 4+/5 proximal to distal  Bilateral lower extremities: 4 -/5 proximal to distal   Assessment/Plan: 1. 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  Physiatrist is providing close team supervision and 24 hour management of active medical problems listed below.  Physiatrist and rehab team continue to assess barriers to discharge/monitor patient progress toward functional and medical goals  Care Tool:  Bathing    Body parts bathed by patient: Right arm, Left arm, Chest, Abdomen, Front perineal area, Right upper leg, Left upper leg, Face, Right lower leg   Body parts bathed by helper: Buttocks, Left lower leg     Bathing assist Assist Level: Minimal Assistance - Patient > 75%     Upper Body Dressing/Undressing Upper body dressing   What is the patient wearing?: Pull over shirt    Upper body assist Assist Level: Set up assist    Lower Body Dressing/Undressing Lower body dressing      What is the patient wearing?: Pants, Underwear/pull up     Lower body assist Assist for lower body dressing: Minimal Assistance - Patient > 75%     Toileting Toileting    Toileting assist Assist for toileting: Minimal Assistance - Patient > 75%     Transfers Chair/bed transfer  Transfers assist     Chair/bed transfer assist level: Contact Guard/Touching assist     Locomotion Ambulation   Ambulation assist      Assist level: Minimal Assistance - Patient > 75% Assistive device: Walker-rolling Max distance: 40 feet   Walk 10 feet activity   Assist     Assist level: Minimal Assistance - Patient >  75% Assistive device: Walker-rolling   Walk 50 feet activity   Assist Walk 50 feet with 2 turns activity did not occur: Safety/medical concerns         Walk 150 feet activity   Assist Walk 150 feet activity did not occur: Safety/medical concerns         Walk 10 feet on uneven surface  activity   Assist Walk 10 feet on uneven surfaces activity did not occur: Safety/medical concerns         Wheelchair     Assist Will patient use wheelchair at discharge?: Yes Type of Wheelchair: Manual     Wheelchair assist level: Supervision/Verbal cueing Max wheelchair distance: 75 feet    Wheelchair 50 feet with 2 turns activity    Assist        Assist Level: Supervision/Verbal cueing   Wheelchair 150 feet activity     Assist      Assist Level: Supervision/Verbal cueing   Blood pressure 110/80, pulse 93, temperature (!) 97.5 F (36.4 C), temperature source Oral, resp. rate 16, height 5' 4"  (1.626 m), weight 56 kg, SpO2 97 %.  Medical Problem List and Plan: 1.  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  Antithrombotics: -DVT/anticoagulation:  Mechanical: Sequential compression devices, below knee Bilateral lower extremities  7/15: Continue Lovenox 2 months from surgery- if at all possible to reduce risk of DVT/PE  7/22- will need at d/c.   - Dopplers negative.              -antiplatelet therapy: N/A 3. Pain Management/neuropathic pain: OxyContin 20 mg bid with oxycodone prn.   Robaxin   Scheduled Tylenol.   Valium   Started Gabapentin 362m TID for left sided sciatic pain that is currently most severe to her  Oxycontin to [id number].   Flector patch to paraspinal area  7/18 pain seems controlled  7/19- doing much better- having calves cramps- ACE wraps help   7/20- no change- con't regimen  7/23- will increase flexeril to 10 mg TID prn 4. Mood: LCSW to follow for evaluation and support.              -antipsychotic agents:  N/A 5. Neuropsych: This patient is capable of making decisions on her own behalf. 6. Skin/Wound Care: incisions healed Continue Juven to promote wound healing.  7. Fluids/Electrolytes/Nutrition: Monitor I/O. Start calcium supplement for hypocalcemia. 7/16 BMP is otherwise stable.  8. Disseminated MSSA infection with osteomyelitis, epidural abscess and R  septic shoulder:  Cefepime X 6 weeks with weekly CBC/BMP/ESR/CRP-end date 06/03/20.  9. HTN: Monitor BP.   Stoped Amlodipine.   Stoped Lisinopril.  Vitals:   06/01/20 1951 06/02/20 0505  BP: 98/76 110/80  Pulse: 98 93  Resp: 16 16  Temp: 97.9 F (36.6 C) (!) 97.5 F (36.4 C)  SpO2: 98% 97%   Stoped Flomax.   7/13- BP good- off flomax- - can't restart flomax  7/22- BP controlled- con't regimen 10. Epidural abscess with diskitis: On IV antibiotics end date 06/03/20  11. Chronic pancreatitis: Abdominal symptoms have resolved. To follow with [facility] for Lap chole on outpatient basis.  12. Hypokalemia: Hypomagnesemia has resolved with supplement. Will continue to keep Kdur at 40 meq bid as tends to drop when decreased once a day.   Potassium 4.0 on 7/9  7/19- K+ 3.9  7/21- K+ 4.1- con't regimen  7/23- K+ normal- con't regimen 13. Acute on chronic anemia: H/H stable.   Hemoglobin 8.9 on 7/14 14. Neurogenic bladder/Urinary retention:   low dose urecholine 71m TID, increased to 25 on 7/10  Continues to retain, consider further adjustments as necessary  7/12- teach in/out caths- agree with pt.  7/14-18: continue I/O teaching, still retaining urine  7/19- learning in/out caths still  7/21- want to to learn to use red rubber caths since can reuse at home- since doesn't have insurance currently. D/w pt and RN  7/22- con't cathing- but has now voided a few time very small amounts <30cc   7/23- con't cathing- trying to get pt more red rubber catheters at d/c.  15. Neurogenic bowel-   Improving   7/22- going to have BM this AM- any minute 16. 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 Dr. KLeonel Ramsaydoes not recommend LP due to discitis but questioned subacute stroke. Dr. CLinus Salmonsdoes not feel septic emboli is cause of changes and recommends no change in antibiotic regimen.   HIV ordered and  nonreactive. 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 ID  Discussed nonreactive treponema pallidum test with patient.   7/12- pt able to think of learning in/out caths on her own- thinks it's heading/improving? 17. Dispo  7/13-   extend pt to next week.  7/19- d/c 7/24 after last dose of IV ABX   7/20- talked with husband about that pt is appropriate for disability at this time- will need a lot of help when discharged.   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   She is unable to walk with an assistive device at this time- is taking 10 steps MAXIMUM in the parallel bars at this time and has neurogenic bowel and bladder- she requires PO bowel meds for bowel and in/out catheterizations to empty her bladder.  She might conitnue to improve, but it will be months to years until she's able to get around without a manual w/c, and I'm not sure if we can say that, guarantee that for her future.  I have discussed her prognosis with pt and husband- as well as SW.     LOS: 24 days A FACE TO FACE EVALUATION WAS PERFORMED  Lindsi Bayliss 06/02/2020, 9:40 AM

## 2020-06-02 NOTE — Progress Notes (Signed)
Physical Therapy Session Note  Patient Details  Name: Debra Schmidt MRN: [id number] Date of Birth: [birth year]  Today's Date: 06/02/2020 PT Individual Time: 1330-1430 PT Individual Time Calculation (min): 60 min   Short Term Goals: Week 3:  PT Short Term Goal 1 (Week 3): STG = LTG d/t ELOS  Skilled Therapeutic Interventions/Progress Updates:  Pt received supine in bed in room. Agreeable to therapy. Denies any pain at rest prior to start of therapy session. ACE wraps in place to B/L thighs. Performed supine to sit at EOB with min A. Pt reports onset of severe LLE muscle spasm while performing bed mobility. Pain addressed with positioning and frequent rest breaks throughout session. RN also made aware of patient's pain and brought muscle relaxer to patient during session. Lumbar corset donned with pt seated at EOB by therapist. Squat pivot transfer from EOB to W/C with CGA. Verbal cues required for correct hand placement while performing SPT. Dependent W/C transfer from room to therapy gym for time conservation. Performed car transfer with car seat lowered (to mimic pt's car) with min A. Verbal cues required for correct hand placement while performing car transfer. Seated in W/C with close supervision, performed LAQs 1 x 10 each leg. Pt performed sit <> stand with RW and CGA throughout remainder of session. Ambulated x53 feet, x25 feet with RW, min A, and close W/C follow. Pt demonstrates a narrow BOS and decreased step length during gait. Dependent W/C transfer from therapy gym to room for time conservation. Squat pivot transfer from W/C to EOB with CGA. Lumbar corset doffed with pt seated at EOB by therapist. Sit to supine in bed with mod A d/t LE management. Pt left supine in bed with all needs in reach, pillow placed under B/L knees for comfort. All questions answered.  Therapy Documentation Precautions:  Precautions Precautions: Fall, Back Precaution Booklet Issued: No Precaution Comments:   (Reviewed back precautions) Required Braces or Orthoses: Spinal Brace Spinal Brace: Lumbar corset, Applied in sitting position Restrictions Weight Bearing Restrictions: No RUE Weight Bearing: Weight bearing as tolerated Other Position/Activity Restrictions: no overhead movement RUE  Therapy/Group: Individual Therapy  Debra Schmidt, SPT 06/02/2020, 3:41 PM

## 2020-06-02 NOTE — Progress Notes (Signed)
Occupational Therapy Discharge Summary  Patient Details  Name: Debra Schmidt MRN: [id number] Date of Birth: 05-Jan-1961  Patient has met 10 of 10 long term goals due to improved activity tolerance, improved balance, postural control and ability to compensate for deficits.  Pt made slow but steady progress during this admission.  Pt LTG downgraded to supervision/min A overall during this admission.  Bathing at bed level with supervision.  LB dressing with sit<>stand at min A. Functional transfers with min A and toileting with min A.  Pt's husband and sister-in-law have participated in therapy and provide the appropriate level of assistance/supervision. Patient to discharge at [facility] Assist level.  Patient's care partner is independent to provide the necessary physical assistance at discharge.      Recommendation:  Patient will benefit from ongoing skilled OT services in home health setting to continue to advance functional skills in the area of BADL and Reduce care partner burden.  Equipment: BSC, shower seat  Reasons for discharge: treatment goals met and discharge from hospital  Patient/family agrees with progress made and goals achieved: Yes  OT Discharge   Vision Baseline Vision/History: Wears glasses Wears Glasses: Reading only Patient Visual Report: No change from baseline Vision Assessment?: No apparent visual deficits Perception  Perception: Within Functional Limits Praxis Praxis: Intact Cognition Overall Cognitive Status: Within Functional Limits for tasks assessed Arousal/Alertness: Awake/alert Orientation Level: Oriented X4 Attention: Focused;Sustained Focused Attention: Appears intact Memory: Impaired Memory Impairment: Decreased short term memory Awareness: Appears intact Problem Solving: Appears intact Safety/Judgment: Appears intact Sensation Sensation Light Touch: Appears Intact Hot/Cold: Appears Intact Proprioception: Appears Intact Stereognosis:  Not tested Coordination Gross Motor Movements are Fluid and Coordinated: No Fine Motor Movements are Fluid and Coordinated: No Coordination and Movement Description: Impaired d/t pain Motor  Motor Motor: Paraplegia;Abnormal postural alignment and control Motor - Skilled Clinical Observations: Incomplete paraplegia    Trunk/Postural Assessment  Cervical Assessment Cervical Assessment:  (forward head) Thoracic Assessment Thoracic Assessment:  (kyphotic) Lumbar Assessment Lumbar Assessment:  (lumbar corset-back precautions) Postural Control Postural Control: Deficits on evaluation  Balance Static Sitting Balance Static Sitting - Balance Support: Feet unsupported;Bilateral upper extremity supported Static Sitting - Level of Assistance: 5: Stand by assistance Dynamic Sitting Balance Sitting balance - Comments: Using Bil UEs to support self EOB (to help with pain)-min A Extremity/Trunk Assessment RUE Assessment RUE Assessment: Not tested Passive Range of Motion (PROM) Comments: no overhead movement per orders LUE Assessment LUE Assessment: Within Functional Limits   Leroy Libman 06/02/2020, 6:48 AM

## 2020-06-02 NOTE — Progress Notes (Signed)
Occupational Therapy Session Note  Patient Details  Name: Debra Schmidt MRN: [id number] Date of Birth: 05-Jan-1961  Today's Date: 06/02/2020 OT Individual Time: 0700-0810 OT Individual Time Calculation (min): 70 min    Short Term Goals: Week 3:  OT Short Term Goal 1 (Week 3): STG=LTG secondary to ELOS  Skilled Therapeutic Interventions/Progress Updates:    Pt resting in bed upon arrival eating breakfast.  OT intervention with focus on bed mobility, sitting balance, doffing/donning socks with AE, activity toleerance, and safety awareness to increase independence with BADLs. Supine>sit EOB with supervision using bed rails. Sitting balance with supervision and BUE support. Pt required assistance donning LSO. Sit>stand with supervision. Stand pivot transfer using RW with CGA. Pt completed grooming tasks seated in w/c at sink.  Pt used reacher to remove socks. Pt donned L sock crossing legs. Pt used sock aide to don R sock. After donning socks pt c/o increased spasms in LLE and L hip.  Pt performed squat pivot transfer back to bed with min A Pt repositioned in bet with min A.  Hot packs provided. Stretching and massage for pain management. Pt remained in bed with all needs within reach and bed alarm activated. RN notified for pain meds.   Therapy Documentation Precautions:  Precautions Precautions: Fall, Back Precaution Booklet Issued: No Precaution Comments:  (Reviewed back precautions) Required Braces or Orthoses: Spinal Brace Spinal Brace: Lumbar corset, Applied in sitting position Restrictions Weight Bearing Restrictions: No RUE Weight Bearing: Weight bearing as tolerated Other Position/Activity Restrictions: no overhead movement RUE Pain:  Pt with "slight" LLE spasm but relieved with hot pack; increased LLE hip spasm at end of sessin; hot pack and repositioned   Therapy/Group: Individual Therapy  Rich Brave 06/02/2020, 8:14 AM

## 2020-06-02 NOTE — Progress Notes (Signed)
Patient ID: Debra Schmidt, female   DOB: [birth year], 60 y.o.   MRN: [id number] [facility] for lovenox, prefer heparin. Marland Kitchen

## 2020-06-02 NOTE — Discharge Summary (Signed)
Physician Discharge Summary  Patient ID: Debra Schmidt MRN: [id number] DOB/AGE: August 09, 1961 59 y.o.  Admit date: 05/09/2020 Discharge date: 06/02/2020  Discharge Diagnoses:  Principal Problem:   Neuropathic pain Active Problems:   Hypokalemia   Incomplete paraplegia (HCC)   Neurogenic bladder   Neurogenic bowel   Acute on chronic anemia   History of hypertension   Muscle spasms of both lower extremities   Abnormal finding on MRI of brain   Hepatitis C antibody test positive   Discharged Condition: stable   Significant Diagnostic Studies: CT HEAD WO CONTRAST  Addendum Date: 05/16/2020   ADDENDUM REPORT: 05/16/2020 08:42 ADDENDUM: Please note the first line of the impression should read: 3 cm focus of ill-defined hypodensity within the right frontoparietal white matter, which is suspicious for recent white matter infarct or other acute intracranial process. Contrast-enhanced brain MRI is recommended for further evaluation. Electronically Signed   By: Jackey Loge DO   On: 05/16/2020 08:42   Addendum Date: 05/15/2020   ADDENDUM REPORT: 05/15/2020 21:46 ADDENDUM: These results were called by telephone at the time of interpretation on 05/15/2020 at 9:45 pm to provider NP Jacalyn Lefevre, who verbally acknowledged these results. Electronically Signed   By: Jackey Loge DO   On: 05/15/2020 21:46   Result Date: 05/16/2020 CLINICAL DATA:  Provided history: Mental status change, persistent or worsening effusion. Increased drowsiness. EXAM: CT HEAD WITHOUT CONTRAST TECHNIQUE: Contiguous axial images were obtained from the base of the skull through the vertex without intravenous contrast. COMPARISON:  No pertinent prior studies available for comparison. FINDINGS: Brain: There is a 3 cm focus of hypodensity within the right frontoparietal white matter which is suspicious for recent white matter infarct or other acute intracranial process. Subtle ill-defined hypoattenuation is also present within the left  parietooccipital subcortical white matter. 7 mm focus of hypodensity within the anterior genu of the corpus callosum/paramedian subcortical anterior left frontal lobe (series 3, image 12). There is no acute intracranial hemorrhage. No extra-axial fluid collection. No evidence of intracranial mass. No midline shift. Vascular: No hyperdense vessel. Skull: Normal. Negative for fracture or focal lesion. Sinuses/Orbits: Visualized orbits show no acute finding. No significant paranasal sinus disease or mastoid effusion at the imaged levels. IMPRESSION: 3 mm focus of ill-defined hypodensity within the right frontoparietal white matter, which is suspicious for recent white matter infarct or other acute intracranial process. Contrast-enhanced brain MRI is recommended for further evaluation. Additionally, there is subtle nonspecific ill-defined hypoattenuation within the left parietooccipital subcortical white matter. A fairly well circumscribed focus of hypoattenuation is present within the anterior genu of the corpus callosum/paramedian subcortical anterior left frontal lobe. These foci would also be better characterized with MRI. Electronically Signed: By: Jackey Loge DO On: 05/15/2020 21:43   MR BRAIN W WO CONTRAST  Result Date: 05/16/2020 CLINICAL DATA:  Initial evaluation for acute encephalopathy, abnormal head CT. EXAM: MRI HEAD WITHOUT AND WITH CONTRAST TECHNIQUE: Multiplanar, multiecho pulse sequences of the brain and surrounding structures were obtained without and with intravenous contrast. CONTRAST:  5.72mL GADAVIST GADOBUTROL 1 MMOL/ML IV SOLN COMPARISON:  Prior CT from 05/15/2020. FINDINGS: Brain: Cerebral volume within normal limits. Few scattered foci of T2/FLAIR hyperintensity involving the periventricular and deep white matter both cerebral hemispheres noted common nonspecific, but most like related to minimal chronic microvascular ischemic disease. Superimposed small remote lacunar infarct present at the  left anterior genu of the corpus callosum (series 10, image 14). 3.1 cm focus of T2/FLAIR signal abnormality seen involving the  subcortical right parietal lobe, corresponding with abnormality seen on prior CT (series 10, image 16). Additional similar somewhat linear signal change measuring 2.2 cm seen at the subcortical parasagittal left frontal lobe (series 11, image 18). Additional smaller focus seen at the subcortical left parietal lobe (series 11, images 16, 14). Lesions demonstrate somewhat poorly defined and angular margins and places. Suggestion of underlying cystic bubbly change at the right parietal lesion (series 10, image 16). Associated diffusion signal abnormality seen about these lesions. No involvement of the overlying cortical gray matter. No associated susceptibility artifact to suggest hemorrhage. No significant mass effect or discernible enhancement seen following contrast administration. Findings are indeterminate. Remainder the brain is essentially normal in appearance. No evidence for acute or subacute infarct elsewhere within the brain no encephalomalacia to suggest chronic cortical infarction. No evidence for acute or chronic intracranial hemorrhage. No other mass lesion, mass effect, or midline shift. No hydrocephalus or extra-axial fluid collection. Pituitary gland suprasellar region within normal limits. Midline structures intact. No other abnormal enhancement. Vascular: Major intracranial vascular flow voids are well maintained. Skull and upper cervical spine: Craniocervical junction within normal limits. Bone marrow signal intensity normal. No scalp soft tissue abnormality. Sinuses/Orbits: Globes and orbital soft tissues within normal limits. Paranasal sinuses are clear. Trace right mastoid effusion noted, of doubtful significance. Inner ear structures grossly normal. Other: None. IMPRESSION: 1. Multifocal nonenhancing T2/FLAIR hyperintense lesions involving the subcortical right greater  than left parietal lobes as well as the subcortical parasagittal left frontal lobe as above. Lesions are fairly unusual in appearance, and of uncertain etiology. Differential considerations include changes related to PML, multifocal encephalitis/cerebritis, or possibly multi centric glioma. A possible demyelinating process is considered, although felt to be less likely given appearance. Correlation with CSF analysis may be helpful for further characterization. 2. 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 3. No other acute intracranial abnormality identified. Electronically Signed   By: Rise Mu M.D.   On: 05/16/2020 02:24   VAS [place] LOWER EXTREMITY VENOUS (DVT)  Result Date: 05/11/2020  Lower Venous DVTStudy Indications: Immobility.  Risk Factors: None identified. Limitations: Poor ultrasound/tissue interface. Comparison Study: No prior studies. Performing Technologist: Chanda Busing RVT  Examination Guidelines: A complete evaluation includes B-mode imaging, spectral Doppler, color Doppler, and power Doppler as needed of all accessible portions of each vessel. Bilateral testing is considered an integral part of a complete examination. Limited examinations for reoccurring indications may be performed as noted. The reflux portion of the exam is performed with the patient in reverse Trendelenburg.  +---------+---------------+---------+-----------+----------+-------------------+ RIGHT    CompressibilityPhasicitySpontaneityPropertiesThrombus Aging      +---------+---------------+---------+-----------+----------+-------------------+ CFV      Full           Yes      Yes                                      +---------+---------------+---------+-----------+----------+-------------------+ SFJ      Full                                                             +---------+---------------+---------+-----------+----------+-------------------+ FV Prox  Full                                                              +---------+---------------+---------+-----------+----------+-------------------+  FV Mid   Full                                                             +---------+---------------+---------+-----------+----------+-------------------+ FV DistalFull                                                             +---------+---------------+---------+-----------+----------+-------------------+ PFV      Full                                                             +---------+---------------+---------+-----------+----------+-------------------+ POP      Full           Yes      Yes                                      +---------+---------------+---------+-----------+----------+-------------------+ PTV                                                   patency shown with                                                        color doppler       +---------+---------------+---------+-----------+----------+-------------------+ PERO     Full                                                             +---------+---------------+---------+-----------+----------+-------------------+   +---------+---------------+---------+-----------+----------+--------------+ LEFT     CompressibilityPhasicitySpontaneityPropertiesThrombus Aging +---------+---------------+---------+-----------+----------+--------------+ CFV      Full           Yes      Yes                                 +---------+---------------+---------+-----------+----------+--------------+ SFJ      Full                                                        +---------+---------------+---------+-----------+----------+--------------+ FV Prox  Full                                                        +---------+---------------+---------+-----------+----------+--------------+  FV Mid   Full                                                         +---------+---------------+---------+-----------+----------+--------------+ FV DistalFull                                                        +---------+---------------+---------+-----------+----------+--------------+ PFV      Full                                                        +---------+---------------+---------+-----------+----------+--------------+ POP      Full           Yes      Yes                                 +---------+---------------+---------+-----------+----------+--------------+ PTV      Full                                                        +---------+---------------+---------+-----------+----------+--------------+ PERO     Full                                                        +---------+---------------+---------+-----------+----------+--------------+     Summary: RIGHT: - There is no evidence of deep vein thrombosis in the lower extremity. However, portions of this examination were limited- see technologist comments above.  - No cystic structure found in the popliteal fossa.  LEFT: - There is no evidence of deep vein thrombosis in the lower extremity.  - No cystic structure found in the popliteal fossa.  *See table(s) above for measurements and observations. Electronically signed by Lemar Livings MD on 05/11/2020 at 4:33:12 PM.    Final     Labs:  Basic Metabolic Panel: Recent Labs  Lab 05/31/20 0355 06/02/20 0433  NA 138 137  K 4.1 4.1  CL 101 100  CO2 28 28  GLUCOSE 120* 105*  BUN 9 14  CREATININE 0.57 0.59  CALCIUM 10.2 10.0    CBC: CBC Latest Ref Rng & Units 05/29/2020 05/22/2020 05/15/2020  WBC 4.0 - 10.5 K/uL 3.8(L) 4.3 5.3  Hemoglobin 12.0 - 15.0 g/dL 1.6(X) 8.9(L) 8.5(L)  Hematocrit 36 - 46 % 30.8(L) 29.7(L) 27.8(L)  Platelets 150 - 400 K/uL 375 335 417(H)    CBG: No results for input(s): GLUCAP in the last 168 hours.  Brief HPI:   Debra Schmidt is a 59 y.o. female with history of back pain, substance  abuse in the past otherwise in good health who was admitted on 04/24/2019 1 to  72-hour history of worsening of pain, back spasms, urinary incontinence with weakness and difficulty walking.  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  UDS was negative.  She was taken to the OR emergently for L1-L2 laminectomy with evacuation of epidural abscess and note made of L1/L2 discitis by Dr. Mikal Plane.  She was started on broad-spectrum antibiotics and wound culture was positive for staph aureus.  She was found to have right shoulder drainage with CT shoulder showing soft tissue ulceration with underlying AC septic arthritis and osteomyelitis of acromion and distal clavicle.  She underwent I&D of AC joint by Dr. Eulah Pont on 06/17.  2D echo done showing EF of 65 to 70% with no wall or valvular abnormality.  Dr. Orvan Falconer recommended at least 6 weeks of IV cefazolin for disseminated MSSA infection with end date of 07/24.  Hospital course was significant for intermittent episodes of significant hypokalemia and hypomagnesemia, tachycardia, tachypnea as well as issues with abdominal pain.  CTA chest was negative for PE but showed incidental findings of acute on chronic pancreatitis with fatty liver and gallbladder sludge.  She was found to have elevated lipase and was treated with fluid boluses and made NPO.  Dr. Marca Ancona recommended supportive care and MRCP done revealing acute on chronic pancreatitis, pancreas diverse and, mild biliary dilatation of 1 cm but no definite choledocholithiasis.  CCS consulted and recommended outpatient follow-up for input on lap chole in the future.  As symptoms improved she was started on low-fat diet.  Norvasc and lisinopril  added due to elevated blood pressures.  Pain control was improving with addition of OxyContin.  Foley in place due to urinary retention and constipation has been an issue.  Therapy was ongoing however she continued to have limitations due to [facility] deficits as well as weakness affecting ADLs and mobility.  CIR was recommended due to functional decline.   Hospital Course: KAYLIANA CODD was admitted to rehab 05/09/2020 for inpatient therapies to consist of PT and OT at least three hours five days a week. Past admission physiatrist, therapy team and rehab RN have worked together to provide customized collaborative inpatient rehab.  Her GI symptoms have resolved and she was tolerating p.o. intake without difficulty.Blood pressures were monitored on TID basis and with close monitoring for orthostatic symptoms. Amlodipine and Lisinopril were discontinued due to hypotension. BLE Dopplers were negative for DVT and she was maintained on subcu Lovenox for DVT prophylaxis.  Due to spinal cord injury, she is to continue on 2 additional months of Lovenox to complete her DVT prophylaxis course.  She had issues with significant back spasms as well as pain. Flexeril was scheduled and Valium was increased to 5 mg twice daily.  Due to neuropathy gabapentin was added in addition to oxycodone prn.     She did develop issues with hallucinations and lethargy and husband reported issues with cognition for a few weeks prior to admission.  MRI of brain was done to rule out stroke and showed multiple nonenhancing hyperintense lesions involving subcortical right greater than left parietal lobe as well as subcortical parasagittal left frontal lobe of uncertain etiology.  Neurology was consulted for input and recommended full work up with labs to rule out HIV,  hepatitis C and RPR.  RPR positive  however T palladium antibody nonreactive and work-up positive for hepatitis C.  Lethargy/hallucination felt to be due to polypharmacy and medications  were adjusted with steady improvement in mentation. ID did not feel changes secondary to septic emboli and no change in antibiotic regimen. Dr. Petra KubaKilpatrick did not recommend LP for further work up due to epidural abscess and felt that demyelinating disease was a possibility but suspected changes in brain were due to chronic process and recommended repeating MRI in 3-4  weeks to ensure stability.   With medication adjustment, hallucinations and lethargy have resolved. Pain is currently controlled on OxyContin bid with oxycodone used bid prn basis. Mood and activity tolerance have improved.  She was maintained on IV cefepime through 06/03/2020 and is tolerating this without side effects.  ID was consulted for input prior to discharge and recommended transitioning patient to Keflex 4 times daily with follow-up on outpatient basis. Hypokalemia/Hypomagnesemia have resolved with supplementation. Follow up labs showed that H/H is improving, CRP/sed reate trending down to 0.7/123 respectively and  WBC stable.   Protein supplements were added to promote wound healing and for low calorie malnutrition. . Foley was removed and she has had urinary retention due to neurogenic bladder. She has been educated and is proficient on performing I/O cath 4-5 times a day to keep bladder volumes <300 cc.  She is continent of bowel and will likely need Miralax 2-3 times a week to avoid constipation.  Patient has been making slow and steady progress to min assist level. Family is supportive and will assist after discharge. Prescriptions filled by [facility] as able and husband instructed on management of medications. She will continue to receive follow up HHPT, HHOT and HHRN by after discharge.     Rehab course: During patient's stay in rehab weekly team conferences were held to monitor patient's progress, set goals and discuss barriers to discharge. At admission, patient required mod to total assist for ADL tasks and mod assist  with mobility. She  has had improvement in activity tolerance, balance, postural control as well as ability to compensate for deficits.  She is able to complete ADLs with supervision to min assist. She requires supervision for bed mobility, CGA for transfer and has been able to ambulate 25-53' with min assist and rest breaks. Family education completed regarding all aspects of safety and care.   Disposition: Home   Diet: Regular.   Special Instructions: 1. Follow up with ID for input on Hep C. 2. Will need repeat MRI brain with contrast in 2-3 weeks.  3. [facility] next 5 days for refill of Oxycodone/Oxycontin. MATCH was extended to 7 days week in order for addition 3 weeks of coverage at [facility].  4. Repeat CBC/BMET in 1-2 weeks.    Discharge Instructions    Ambulatory referral to Physical Medicine Rehab   Complete by: As directed    1-2 weeks TC appt     Allergies as of 06/04/2020      Reactions   Penicillins Hives   Tolerated Zosyn and Cefazolin 04/2020      Medication List    STOP taking these medications   ibuprofen 200 MG tablet Commonly known as: ADVIL     TAKE these medications   acetaminophen 325 MG tablet Commonly known as: TYLENOL Take 2 tablets (650 mg total) by mouth 3 (three) times daily.   Baclofen 5 MG Tabs Take 5 mg by mouth at bedtime.  bethanechol 25 MG tablet Commonly known as: URECHOLINE Take 1 tablet (25 mg total) by mouth 3 (three) times daily.   calcium citrate 950 (200 Ca) MG tablet Commonly known as: CALCITRATE - dosed in mg elemental calcium Take 1 tablet (200 mg of elemental calcium total) by mouth daily.   cephALEXin 500 MG capsule Commonly known as: KEFLEX Take 1 capsule (500 mg total) by mouth every 6 (six) hours.   cyclobenzaprine 10 MG tablet Commonly known as: FLEXERIL Take 1 tablet (10 mg total) by mouth 3 (three) times daily.   diazepam 2 MG tablet Commonly known as:  VALIUM Take 0.5 tablets (1 mg total) by mouth at bedtime.   diclofenac 1.3 % Ptch Commonly known as: FLECTOR Place 1 patch onto the skin 2 (two) times daily.   docusate sodium 100 MG capsule Commonly known as: COLACE Take 2 capsules (200 mg total) by mouth daily.   enoxaparin 40 MG/0.4ML injection Commonly known as: LOVENOX Inject 0.4 mLs (40 mg total) into the skin daily.   lidocaine 2 % jelly Commonly known as: XYLOCAINE Apply topically as needed (Use with in and out catheter).   magnesium oxide 400 (241.3 Mg) MG tablet Commonly known as: MAG-OX Take 0.5 tablets (200 mg total) by mouth 2 (two) times daily.   multivitamin with minerals Tabs tablet Take 1 tablet by mouth daily.   nutrition supplement (JUVEN) Pack Take 1 packet by mouth 2 (two) times daily between meals.   oxyCODONE 10 mg 12 hr tablet--Rx# 14 pills Commonly known as: OXYCONTIN Take 1 tablet (10 mg total) by mouth every 12 (twelve) hours.   Oxycodone HCl 10 MG Tabs--Rx # 14 pills Take 0.5-1 tablets (5-10 mg total) by mouth 2 (two) times daily as needed for severe pain.   pantoprazole 40 MG tablet Commonly known as: PROTONIX Take 1 tablet (40 mg total) by mouth at bedtime.   phenazopyridine 95 MG tablet Commonly known as: PYRIDIUM Take 95 mg by mouth 3 (three) times daily as needed for pain.   polyethylene glycol 17 g packet Commonly known as: MIRALAX / GLYCOLAX Take 17 g by mouth daily as needed for mild constipation.   potassium chloride SA 20 MEQ tablet Commonly known as: KLOR-CON Take 2 tablets (40 mEq total) by mouth daily.       Follow-up Information    Genice Rouge, MD Follow up on 06/08/2020.   Specialty: Physical Medicine and Rehabilitation Why: Be there at 9 am for 9:20 appointment/Your office evaluated by Jacalyn Lefevre NP.   Contact information: [address] [place] [address] [phone]        Coletta Memos, MD. Call.   Specialty: Neurosurgery Why: Monday  for follow up on back Contact information: [address] [place] [address] [phone]        Sheral Apley, MD. Call.   Specialty: Orthopedic Surgery Why: MONDAY for follow up on collar bone. Contact information: [address] [place] [address] [phone]        Kerin Salen, MD. Call.   Specialty: Gastroenterology Why: as needed for GI issues Contact information: [address] [place] [address] [phone]        Diamantina Monks, MD. Call.   Specialty: Surgery Why: as needed for follow up on gallbladder. Contact information: [address] [place] [address] [phone]        Marcine Matar, MD Follow up  on 06/23/2020.   Specialty: Internal Medicine Why: Appointment at 2:30 pm Contact information: [address] [place] [address] [phone]        Gardiner Barefoot, MD Follow up on 06/22/2020.   Specialty: Infectious Diseases Why: Appointment at 9 am Contact information: [address] [place] [address] [phone]               Signed: Jacquelynn Cree 06/06/2020, 10:04 PM

## 2020-06-03 ENCOUNTER — Inpatient Hospital Stay (HOSPITAL_COMMUNITY): Payer: Self-pay | Admitting: Physical Therapy

## 2020-06-03 ENCOUNTER — Encounter (HOSPITAL_COMMUNITY): Payer: Self-pay | Admitting: Occupational Therapy

## 2020-06-03 DIAGNOSIS — R0989 Other specified symptoms and signs involving the circulatory and respiratory systems: Secondary | ICD-10-CM

## 2020-06-03 DIAGNOSIS — E876 Hypokalemia: Secondary | ICD-10-CM

## 2020-06-03 NOTE — Progress Notes (Signed)
[place] PHYSICAL MEDICINE & REHABILITATION PROGRESS NOTE   Subjective: Patient seen sitting up in bed this morning.  She states she slept well overnight.  She is very appreciative of her care.  She feels like she is getting stronger.  She also notes improvement in bladder function as well as spasms.  ROS: Denies CP, SOB, N/V/D  Objective:   No results found. No results for input(s): WBC, HGB, HCT, PLT in the last 72 hours. Recent Labs    06/02/20 0433  NA 137  K 4.1  CL 100  CO2 28  GLUCOSE 105*  BUN 14  CREATININE 0.59  CALCIUM 10.0    Intake/Output Summary (Last 24 hours) at 06/03/2020 1334 Last data filed at 06/03/2020 1015 Gross per 24 hour  Intake 471.3 ml  Output 1275 ml  Net -803.7 ml    Physical Exam: Vital Signs Blood pressure (!) 133/89, pulse 92, temperature 97.7 F (36.5 C), temperature source Oral, resp. rate 16, height 5' 4"  (1.626 m), weight 56 kg, SpO2 98 %. Constitutional: No distress . Vital signs reviewed. HENT: Normocephalic.  Atraumatic. Eyes: EOMI. No discharge. Cardiovascular: No JVD.  RRR. Respiratory: Normal effort.  No stridor.  Bilateral clear to auscultation. GI: Non-distended.  BS +. Skin: Warm and dry.  Intact. Psych: Normal mood.  Normal behavior.  Pleasant. Musc: Left hip with tenderness.  No edema. Neuro: Alert Motor: RLE: 4/5 hip flexion, knee extension, 4+/5 ankle dorsiflexion LLE: Hip flexion, knee extension 4 -/5 (pain inhibition), ankle dorsiflexion 4+/5  Assessment/Plan: 1. 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  Physiatrist is providing close team supervision and 24 hour management of active medical problems listed below.  Physiatrist and rehab team continue to assess barriers to discharge/monitor patient progress toward functional and medical  goals  Care Tool:  Bathing    Body parts bathed by patient: Right arm, Left arm, Chest, Abdomen, Front perineal area, Right upper leg, Left upper leg, Face, Right lower leg   Body parts bathed by helper: Buttocks, Left lower leg     Bathing assist Assist Level: Minimal Assistance - Patient > 75%     Upper Body Dressing/Undressing Upper body dressing   What is the patient wearing?: Pull over shirt    Upper body assist Assist Level: Set up assist    Lower Body Dressing/Undressing Lower body dressing      What is the patient wearing?: Pants, Underwear/pull up     Lower body assist Assist for lower body dressing: Minimal Assistance - Patient > 75%     Toileting Toileting    Toileting assist Assist for toileting: Minimal Assistance - Patient > 75%     Transfers Chair/bed transfer  Transfers assist     Chair/bed transfer assist level: Contact Guard/Touching assist     Locomotion Ambulation   Ambulation assist      Assist level: Minimal Assistance - Patient > 75% Assistive device: Walker-rolling Max distance: 53 feet   Walk 10 feet activity   Assist     Assist level: Minimal Assistance - Patient > 75% Assistive device: Walker-rolling   Walk 50 feet activity   Assist Walk 50 feet with 2 turns activity did not occur: Safety/medical concerns  Assist level: Minimal Assistance - Patient > 75% Assistive device: Walker-rolling    Walk 150 feet activity   Assist Walk 150 feet activity  did not occur: Safety/medical concerns         Walk 10 feet on uneven surface  activity   Assist Walk 10 feet on uneven surfaces activity did not occur: Safety/medical concerns         Wheelchair     Assist Will patient use wheelchair at discharge?: Yes Type of Wheelchair: Manual    Wheelchair assist level: Supervision/Verbal cueing Max wheelchair distance: 75 feet    Wheelchair 50 feet with 2 turns activity    Assist        Assist  Level: Supervision/Verbal cueing   Wheelchair 150 feet activity     Assist      Assist Level: Supervision/Verbal cueing   Blood pressure (!) 133/89, pulse 92, temperature 97.7 F (36.5 C), temperature source Oral, resp. rate 16, height 5' 4"  (1.626 m), weight 56 kg, SpO2 98 %.  Medical Problem List and Plan: 1.  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  Antithrombotics: -DVT/anticoagulation:  Mechanical: Sequential compression devices, below knee Bilateral lower extremities  7/15: Continue Lovenox 2 months from surgery- if at all possible to reduce risk of DVT/PE  7/22- will need at d/c.   - Dopplers negative.              -antiplatelet therapy: N/A 3. Pain Management/neuropathic pain: OxyContin 20 mg bid with oxycodone prn.   Robaxin   Scheduled Tylenol.   Valium   Gabapentin 386m TID for left sided sciatic pain   Oxycontin to 235mq12H.   Flector patch to paraspinal area, increased to 10 3 times daily.  Controlled on 7/24 4. Mood: LCSW to follow for evaluation and support.              -antipsychotic agents:  N/A 5. Neuropsych: This patient is capable of making decisions on her own behalf. 6. Skin/Wound Care: incisions healed. Continue Juven to promote wound healing.  7. Fluids/Electrolytes/Nutrition: Monitor I/Os.   Started calcium supplement for hypocalcemia.  8. Disseminated MSSA infection with osteomyelitis, epidural abscess and R septic shoulder:  Cefepime X 6 weeks with weekly CBC/BMP/ESR/CRP-end date 06/03/20.  9. HTN: Monitor BP.   Stoped Amlodipine.   Stoped Lisinopril.  Vitals:   06/02/20 1925 06/03/20 0329  BP: 117/80 (!) 133/89  Pulse: 99 92  Resp: 16 16  Temp: (!) 97.5 F (36.4 C) 97.7 F (36.5 C)  SpO2: 99% 98%   Stoped Flomax.   Labile, but asymptomatic on 7/24 10. Epidural abscess with diskitis: On IV antibiotics end date 06/03/20  11. Chronic  pancreatitis: Abdominal symptoms have resolved. To follow with [facility] for Lap chole on outpatient basis.  12. Hypokalemia: Hypomagnesemia has resolved with supplement. Will continue to keep Kdur at 40 meq bid as tends to drop when decreased once a day.   Potassium 4.1 on 7/23 13. Acute on chronic anemia: H/H stable.   Hemoglobin 9.6 on 7/19 14. Neurogenic bladder/Urinary retention:   low dose urecholine 107mID, increased to 25 on 7/10  Continues to retain, consider further adjustments as necessary  Continue cathing- trying to get pt more red rubber catheters at d/c.  15. Neurogenic bowel-   Improving 16. 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 Dr. KirLeonel Ramsayes not recommend LP due to discitis but questioned subacute stroke. Dr. ComLinus Salmonses not feel septic emboli is cause of changes and recommends no  change in antibiotic regimen.   HIV ordered and nonreactive. 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 ID  Discussed nonreactive treponema pallidum test with patient.   Improving  LOS: 25 days A FACE TO FACE EVALUATION WAS PERFORMED  Kiyla Ringler Lorie Phenix 06/03/2020, 1:34 PM

## 2020-06-03 NOTE — Progress Notes (Signed)
Physical Therapy Discharge Summary  Patient Details  Name: Debra Schmidt MRN: [id number] Date of Birth: 01/09/1961  Today's Date: 06/03/2020 PT Individual Time: 1500-1550 PT Individual Time Calculation (min): 50 min    Patient has met 7 of 7 long term goals due to improved activity tolerance, improved balance, improved postural control, increased strength, decreased pain and ability to compensate for deficits.  Patient to discharge at a wheelchair level [facility].   Patient's care partner is independent to provide the necessary physical assistance at discharge. Patient's family has completed hands-on education and is safe to assist pt upon d/c home.  Reasons goals not met: Patient has met all rehab goals.   Recommendation:  Patient will benefit from ongoing skilled PT services in home health setting to continue to advance safe functional mobility, address ongoing impairments in endurance, strength, balance, safety, independence with functional mobility, pain management, and minimize fall risk.  Equipment: 16x16 hemi height w/c; pt already received RW  Reasons for discharge: treatment goals met and discharge from hospital  Patient/family agrees with progress made and goals achieved: Yes   Skilled Intervention: Pt received seated in bed, agreeable to PT session. Pt reports pain is well controlled this PM. Pt requesting to use the bathroom. Bed mobility with Supervision. Stand pivot transfer bed to [facility] with CGA. Adjusted pt's BSC height for improved fit. Pt is able to continently void while seated on BSC, see Flowsheet for details. Transfer back to bed with CGA. Sit to supine Supervision. Per pt report she has not yet received her w/c that she will need upon d/c home. Contacted [employer] [phone]) and they initially report her equipment will be delivered on Monday 7/26. Informed company that plan is for pt to d/c home on 7/25 and she will need her equipment delivered prior to d/c home. [employer]  unable to provide hemi-height 16x16 w/c due to pt dimensions but can receive regular height 16x16 manual w/c to be delivered to her room prior to d/c home tomorrow. Updated patient and RN on plan for equipment. Pt to update her husband. Pt left supine in bed with needs in reach, bed alarm in place at end of session.  PT Discharge Precautions/Restrictions Precautions Precautions: Fall;Back Precaution Comments: reviewed precautions Required Braces or Orthoses: Spinal Brace Spinal Brace: Lumbar corset;Applied in sitting position Restrictions Other Position/Activity Restrictions: no overhead movement RUE Vision/Perception  Perception Perception: Within Functional Limits Praxis Praxis: Intact  Cognition Overall Cognitive Status: Within Functional Limits for tasks assessed Arousal/Alertness: Awake/alert Attention: Focused;Sustained Focused Attention: Appears intact Sustained Attention: Appears intact Memory: Impaired Memory Impairment: Decreased short term memory Awareness: Appears intact Problem Solving: Appears intact Safety/Judgment: Appears intact Sensation Sensation Light Touch: Appears Intact Proprioception: Appears Intact Coordination Gross Motor Movements are Fluid and Coordinated: No Fine Motor Movements are Fluid and Coordinated: Yes Coordination and Movement Description: impaired 2/2 pain Motor  Motor Motor: Paraplegia;Abnormal postural alignment and control Motor - Skilled Clinical Observations: Incomplete paraplegia Motor - Discharge Observations: incomplete paraplegia  Mobility Bed Mobility Bed Mobility: Rolling Right;Rolling Left;Supine to Sit;Sit to Supine Rolling Right: Supervision/verbal cueing Rolling Left: Supervision/Verbal cueing Supine to Sit: Supervision/Verbal cueing Sit to Supine: Supervision/Verbal cueing Transfers Transfers: Sit to Stand;Stand Pivot Transfers;Squat Pivot Transfers Sit to Stand: Contact Guard/Touching assist Stand to Sit: Contact  Guard/Touching assist Stand Pivot Transfers: Contact Guard/Touching assist Stand Pivot Transfer Details: Tactile cues for posture;Visual cues/gestures for sequencing;Verbal cues for technique;Verbal cues for gait pattern Squat Pivot Transfers: Contact Guard/Touching assist Transfer (Assistive device): Rolling walker  Locomotion  Gait Ambulation: Yes Gait Assistance: 2 Helpers Assistive device: Building surveyor Assistance Details: Verbal cues for technique;Verbal cues for precautions/safety;Verbal cues for safe use of DME/AE Gait Gait: Yes Gait Pattern: Impaired Gait Pattern: Step-to pattern;Decreased step length - right;Decreased step length - left;Antalgic;Trunk flexed;Narrow base of support Gait velocity: decreased Stairs / Additional Locomotion Stairs: No Architect: Yes Wheelchair Assistance: Chartered loss adjuster: Both upper extremities Wheelchair Parts Management: Needs assistance  Trunk/Postural Assessment  Cervical Assessment Cervical Assessment: Exceptions to [facility] (forward head) Thoracic Assessment Thoracic Assessment: Exceptions to [facility] (kyphotic) Lumbar Assessment Lumbar Assessment: Exceptions to [facility] (spinal precautions; lumbar corset) Postural Control Postural Control: Deficits on evaluation  Balance Balance Balance Assessed: Yes Static Sitting Balance Static Sitting - Balance Support: No upper extremity supported;Feet supported Static Sitting - Level of Assistance: 5: Stand by assistance Static Standing Balance Static Standing - Balance Support: Bilateral upper extremity supported;During functional activity Static Standing - Level of Assistance: 4: Min assist Dynamic Standing Balance Dynamic Standing - Balance Support: Bilateral upper extremity supported;During functional activity Dynamic Standing - Level of Assistance: 4: Min assist Extremity Assessment   RLE Assessment RLE Assessment: Exceptions to  [facility] General Strength Comments: impaired; see below RLE Strength Right Hip Flexion: 4+/5 Right Knee Flexion: 3+/5 Right Knee Extension: 4+/5 Right Ankle Dorsiflexion: 4/5 LLE Assessment LLE Assessment: Exceptions to [facility] General Strength Comments: impaired; see below LLE Strength Left Hip Flexion: 4+/5 Left Knee Flexion: 3+/5 Left Knee Extension: 4-/5 Left Ankle Dorsiflexion: 3+/5     Excell Seltzer, PT, DPT 06/03/2020, 4:00 PM

## 2020-06-03 NOTE — Progress Notes (Signed)
Occupational Therapy Session Note  Patient Details  Name: Debra Schmidt MRN: [id number] Date of Birth: [birth year]  Today's Date: 06/03/2020 OT Group Time: 1045-1200 OT Group Time Calculation (min): 75 min  Skilled Therapeutic Interventions/Progress Updates:    Pt engaged in therapeutic w/c level dance group focusing on patient choice, UE/LE strengthening, salience, activity tolerance, and social participation. Pt was guided through various dance-based exercises involving UEs/LEs and trunk. All music was selected by group members. Emphasis placed on NMR and activity tolerance while adhering to back precautions. Pt was very participative in group, requesting music, socially engaging with others, and singing aloud while moving UEs/LEs in beat to music. Pt mindful of her precautions when dancing. She declined standing though was provided with this opportunity. At end of session pt was left with RN to return to room.     Therapy Documentation Precautions:  Precautions Precautions: Fall, Back Precaution Booklet Issued: No Precaution Comments: reviewed precautions Required Braces or Orthoses: Spinal Brace Spinal Brace: Lumbar corset, Applied in sitting position Restrictions Weight Bearing Restrictions: Yes RUE Weight Bearing: Weight bearing as tolerated Other Position/Activity Restrictions: no overhead movement RUE Vital Signs: Therapy Vitals Temp: 98.5 F (36.9 C) Pulse Rate: (!) 109 Resp: 18 BP: 103/75 Patient Position (if appropriate): Lying Oxygen Therapy SpO2: 98 % O2 Device: Room Air Pain: no s/s pain during tx, reported onset of leg cramping when standing and therefore deferred standing during session Pain Assessment Pain Scale: 0-10 Pain Score: 8  Pain Type: Acute pain Pain Location: Back Pain Orientation: Lower Pain Descriptors / Indicators: Aching Pain Frequency: Constant Pain Onset: On-going Pain Intervention(s): Medication (See eMAR) ADL: :     Therapy/Group:  Group Therapy  Elven Laboy A Trejan Buda 06/03/2020, 4:52 PM

## 2020-06-04 NOTE — Progress Notes (Signed)
[place] PHYSICAL MEDICINE & REHABILITATION PROGRESS NOTE   Subjective: Patient seen sitting up in bed this morning.  She states she slept well overnight.  She is appreciative of her care.  She is ready for discharge.  She notes improvement in spasms.  ROS: Denies CP, SOB, N/V/D  Objective:   No results found. No results for input(s): WBC, HGB, HCT, PLT in the last 72 hours. Recent Labs    06/02/20 0433  NA 137  K 4.1  CL 100  CO2 28  GLUCOSE 105*  BUN 14  CREATININE 0.59  CALCIUM 10.0    Intake/Output Summary (Last 24 hours) at 06/04/2020 1316 Last data filed at 06/04/2020 0825 Gross per 24 hour  Intake 594 ml  Output 2100 ml  Net -1506 ml    Physical Exam: Vital Signs Blood pressure 96/80, pulse 88, temperature (!) 97.5 F (36.4 C), resp. rate 17, height 5' 4"  (1.626 m), weight 56 kg, SpO2 99 %.  Constitutional: No distress . Vital signs reviewed. HENT: Normocephalic.  Atraumatic. Eyes: EOMI. No discharge. Cardiovascular: No JVD. Respiratory: Normal effort.  No stridor. GI: Non-distended. Skin: Warm and dry.  Intact. Psych: Normal mood.  Normal behavior. Musc: Left hip tenderness Neuro: Alert Motor: RLE: 4/5 hip flexion, knee extension, 4+/5 ankle dorsiflexion LLE: Hip flexion, knee extension 4 -/5 (pain inhibition), ankle dorsiflexion 4+/5  Assessment/Plan: 1. 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  Physiatrist is providing close team supervision and 24 hour management of active medical problems listed below.  Physiatrist and rehab team continue to assess barriers to discharge/monitor patient progress toward functional and medical goals  Care Tool:  Bathing    Body parts bathed by patient: Right arm, Left arm, Chest, Abdomen, Front perineal area, Right upper leg, Left upper leg,  Face, Right lower leg   Body parts bathed by helper: Buttocks, Left lower leg     Bathing assist Assist Level: Minimal Assistance - Patient > 75%     Upper Body Dressing/Undressing Upper body dressing   What is the patient wearing?: Pull over shirt    Upper body assist Assist Level: Set up assist    Lower Body Dressing/Undressing Lower body dressing      What is the patient wearing?: Pants, Underwear/pull up     Lower body assist Assist for lower body dressing: Minimal Assistance - Patient > 75%     Toileting Toileting    Toileting assist Assist for toileting: Minimal Assistance - Patient > 75%     Transfers Chair/bed transfer  Transfers assist     Chair/bed transfer assist level: Contact Guard/Touching assist     Locomotion Ambulation   Ambulation assist      Assist level: Minimal Assistance - Patient > 75% Assistive device: Walker-rolling Max distance: 53 feet   Walk 10 feet activity   Assist     Assist level: Minimal Assistance - Patient > 75% Assistive device: Walker-rolling   Walk 50 feet activity   Assist Walk 50 feet with 2 turns activity did not occur: Safety/medical concerns  Assist level: Minimal Assistance - Patient > 75% Assistive device: Walker-rolling    Walk 150 feet activity   Assist Walk 150 feet activity did not occur: Safety/medical concerns         Walk 10 feet on uneven surface  activity   Assist Walk 10 feet  on uneven surfaces activity did not occur: Safety/medical concerns         Wheelchair     Assist Will patient use wheelchair at discharge?: Yes Type of Wheelchair: Manual    Wheelchair assist level: Supervision/Verbal cueing Max wheelchair distance: 150'    Wheelchair 50 feet with 2 turns activity    Assist        Assist Level: Supervision/Verbal cueing   Wheelchair 150 feet activity     Assist      Assist Level: Supervision/Verbal cueing   Blood pressure 96/80, pulse  88, temperature (!) 97.5 F (36.4 C), resp. rate 17, height 5' 4"  (1.626 m), weight 56 kg, SpO2 99 %.  Medical Problem List and Plan: 1.  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  Antithrombotics: -DVT/anticoagulation:  Mechanical: Sequential compression devices, below knee Bilateral lower extremities  7/15: Continue Lovenox 2 months from surgery- if at all possible to reduce risk of DVT/PE  7/22- will need at d/c.   - Dopplers negative.              -antiplatelet therapy: N/A 3. Pain Management/neuropathic pain: OxyContin 20 mg bid with oxycodone prn.   Robaxin   Scheduled Tylenol.   Valium   Gabapentin 328m TID for left sided sciatic pain   Oxycontin to 268mq12H.   Flector patch to paraspinal area, increased to 10 3 times daily.  Improving on 7/25 4. Mood: LCSW to follow for evaluation and support.              -antipsychotic agents:  N/A 5. Neuropsych: This patient is capable of making decisions on her own behalf. 6. Skin/Wound Care: incisions healed. Continue Juven to promote wound healing.  7. Fluids/Electrolytes/Nutrition: Monitor I/Os.   Started calcium supplement for hypocalcemia.  8. Disseminated MSSA infection with osteomyelitis, epidural abscess and R septic shoulder:  Cefepime X 6 weeks with weekly CBC/BMP/ESR/CRP completed on 7/24.  PICC line DC'd.  9. HTN: Monitor BP.   Stoped Amlodipine.   Stoped Lisinopril.  Vitals:   06/03/20 2031 06/04/20 0409  BP: 113/77 96/80  Pulse: 104 88  Resp: 18 17  Temp: 97.9 F (36.6 C) (!) 97.5 F (36.4 C)  SpO2: 99% 99%   Stoped Flomax.   Relatively controlled on 7/25 10. Epidural abscess with diskitis: On IV antibiotics end date 06/03/20  11. Chronic pancreatitis: Abdominal symptoms have resolved. To follow with [facility] for Lap chole on outpatient  basis.  12. Hypokalemia: Hypomagnesemia has resolved with supplement. Will continue to keep Kdur at 40 meq bid as tends to drop when decreased once a day.   Potassium 4.1 on 7/23 13. Acute on chronic anemia: H/H stable.   Hemoglobin 9.6 on 7/19 14. Neurogenic bladder/Urinary retention:   low dose urecholine 1061mID, increased to 25 on 7/10  Appears improving  Continue cathing- trying to get pt more red rubber catheters at d/c.  15. Neurogenic bowel-   Improving 16. 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 Dr. KirLeonel Ramsayes not recommend LP due to discitis but questioned subacute stroke. Dr. ComLinus Salmonses not feel septic emboli is cause of changes and recommends no change in antibiotic regimen.   HIV ordered and nonreactive. 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 ID  Discussed nonreactive treponema  pallidum test with patient.   Improved  LOS: 26 days A FACE TO FACE EVALUATION WAS PERFORMED  Debra Schmidt 06/04/2020, 1:16 PM

## 2020-06-04 NOTE — Progress Notes (Signed)
Patient d/c home with family members at 1000. She was transported to the lobby in the wheelchair by nursing staff. A&O x4 at the time of d/c, denied pain or discomfort. At questions and concerns were answered to her satisfaction. We continue to monitor.

## 2020-06-05 NOTE — Discharge Instructions (Signed)
Inpatient Rehab Discharge Instructions  Mckenzee Beem Pankratz Discharge date and time:  06/04/20 Activities/Precautions/ Functional Status: Activity: no lifting, driving, or strenuous exercise till cleared by MD Diet: regular diet--Monitor amount of sweet intake.  Wound Care: keep wound clean and dry   Functional status:  ___ No restrictions     ___ Walk up steps independently _X__ 24/7 supervision/assistance   ___ Walk up steps with assistance ___ Intermittent supervision/assistance  ___ Bathe/dress independently ___ Walk with walker     ___ Bathe/dress with assistance ___ Walk Independently    ___ Shower independently ___ Walk with assistance    ___ Shower with assistance ___ No alcohol     ___ Return to work/school ________   COMMUNITY REFERRALS UPON DISCHARGE:    Home Health:   PT    OT       RN                                         Agency: [facility]    Phone: [phone] *Please expect follow-up to discuss scheduling your home visit. If you have not received follow-up be sure to contact the agency directly to discuss further.*   Medical Equipment/Items Ordered: wheelchair                                                 Agency/Supplier: [employer] [phone]  Medical Equipment/Items Ordered: catheters 83fr                                                 Agency/Supplier: [employer] [phone] *samples catheters will be mailed out to the home. If you have not received, be sure to contact the vendor directly.*   GENERAL COMMUNITY RESOURCES FOR PATIENT/FAMILY: 1) Hospital follow- Friday August 13 at 2:30pm with Dr. Jonah Blue [facility] [address] [place], [place] [address] [phone]  2) New patient Appointment- Thursday, September 9 at 3:30pm with Dr. Jonah Blue  [facility] [address] [place], [place] [address] [phone]  *Be sure to discuss  financial assistance and orange card at time of appointments.   Special Instructions: 1. Husband/sister in law to manage medications.  2. Need to cath every 4-6 hours to keep volumes less than 300 cc. Keep a record of intake and output   My questions have been answered and I understand these instructions. I will adhere to these goals and the provided educational materials after my discharge from the hospital.  Patient/Caregiver Signature _______________________________ Date __________  Clinician Signature _______________________________________ Date __________  Please bring this form and your medication list with you to all your follow-up doctor's appointments.

## 2020-06-05 NOTE — Progress Notes (Signed)
Inpatient Rehabilitation Care Coordinator  Discharge Note  The overall goal for the admission was met for:   Discharge location: Yes. D/c to home with support from husband, son, and sister in law [place].   Length of Stay: Yes. 25 days.   Discharge activity level: Yes. Min Assist.   Home/community participation: Yes. Limited.   Services provided included: MD, RD, PT, OT, SLP, RN, CM, TR, Pharmacy, Neuropsych and SW  Financial Services: Other: Uninsured  Follow-up services arranged: Home Health: [facility] for HHPT/OT/SLP (charity) and DME: [facility] for 3in1 BSC, RW, wheelchair  [employer] for catheters 78f (sample package to be mailed to home).  1) Hospital follow- Friday August 13 at 2:30pm [facility] [phone] 2) New patient Appointment- Thursday, September 9 at 3:30pm with Dr. [facility] DriveAFussels [place] [phone] Comments (or additional information): contact pt husband JCharlotte Crumb#[phone] sister in law LLorre Nick#[phone] Patient/Family verbalized understanding of follow-up arrangements: Yes  Individual responsible for coordination of the follow-up plan: Pt to have assistance with coordinating care needs.   Confirmed correct DME delivered: ARana Snare7/26/2021    ARana Snare

## 2020-06-06 DIAGNOSIS — M62838 Other muscle spasm: Secondary | ICD-10-CM

## 2020-06-06 DIAGNOSIS — B182 Chronic viral hepatitis C: Secondary | ICD-10-CM

## 2020-06-06 DIAGNOSIS — R9089 Other abnormal findings on diagnostic imaging of central nervous system: Secondary | ICD-10-CM

## 2020-06-08 ENCOUNTER — Other Ambulatory Visit: Payer: Self-pay

## 2020-06-08 ENCOUNTER — Encounter: Payer: Medicaid Other | Attending: Registered Nurse | Admitting: Registered Nurse

## 2020-06-08 ENCOUNTER — Encounter: Payer: Self-pay | Admitting: Registered Nurse

## 2020-06-08 VITALS — BP 112/80 | HR 123 | Temp 98.9°F

## 2020-06-08 DIAGNOSIS — Z8679 Personal history of other diseases of the circulatory system: Secondary | ICD-10-CM | POA: Diagnosis not present

## 2020-06-08 DIAGNOSIS — R7881 Bacteremia: Secondary | ICD-10-CM | POA: Diagnosis not present

## 2020-06-08 DIAGNOSIS — M62838 Other muscle spasm: Secondary | ICD-10-CM | POA: Insufficient documentation

## 2020-06-08 DIAGNOSIS — G8222 Paraplegia, incomplete: Secondary | ICD-10-CM | POA: Diagnosis not present

## 2020-06-08 DIAGNOSIS — K592 Neurogenic bowel, not elsewhere classified: Secondary | ICD-10-CM | POA: Insufficient documentation

## 2020-06-08 DIAGNOSIS — N319 Neuromuscular dysfunction of bladder, unspecified: Secondary | ICD-10-CM | POA: Insufficient documentation

## 2020-06-08 DIAGNOSIS — G8918 Other acute postprocedural pain: Secondary | ICD-10-CM | POA: Insufficient documentation

## 2020-06-08 DIAGNOSIS — M009 Pyogenic arthritis, unspecified: Secondary | ICD-10-CM | POA: Insufficient documentation

## 2020-06-08 DIAGNOSIS — B9561 Methicillin susceptible Staphylococcus aureus infection as the cause of diseases classified elsewhere: Secondary | ICD-10-CM | POA: Diagnosis not present

## 2020-06-08 MED ORDER — OXYCODONE HCL 10 MG PO TABS: 5.0000 mg | ORAL_TABLET | Freq: Two times a day (BID) | ORAL | 0 refills | Status: DC | PRN

## 2020-06-08 MED ORDER — OXYCODONE HCL ER 10 MG PO T12A: 10.0000 mg | EXTENDED_RELEASE_TABLET | Freq: Two times a day (BID) | ORAL | 0 refills | Status: DC

## 2020-06-08 MED FILL — oxyCODONE HCL 10 MG TABS: 10 | 23 days supply | Qty: 46 | Fill #0

## 2020-06-08 MED FILL — OxyCONTIN 10 MG T12A: 10 | 23 days supply | Qty: 46 | Fill #0

## 2020-06-08 NOTE — Progress Notes (Signed)
Subjective:    Patient ID: Hendricks Schmidt, female    DOB: [birth year], 60 y.o.   MRN: [id number]  HPI: Debra Schmidt is a 59 y.o. female who is here for hospital follow up appointment of her Incomplete Paraplegia, Neurogenic Bladder,Neurogenic Bowel, History of Hypertension and Muscle Spasms of bilateral lower extremities. Debra Schmidt presented to the emergency room on 04/23/2020 with complaint of weakness in bilateral lower extremities and urinary incontinence. Also states she had long history of back pain with a 72 hour history of worsening pain. Neurosurgery and Infectious Disease was consulted.  MR Lumbar Spine:  IMPRESSION: 1. 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 Septic arthritis of the L1-L2 facets is also possible.  2. 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  3. Superimposed lateral paraspinal soft tissue abscesses, greater on the right maximal at L1-L2 and individually up to 2.4 cm.  MR Thoracic Spine:  IMPRESSION: 1. Positive for compression of the lower thoracic spinal cord and conus by what appears to be a bulky multiloculated intraspinal fluid collection emanating from the abnormal lumbar L1-L2 level (detailed separately). 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  2. Severe abnormal signal in the spinal cord - especially from T8 inferiorly - could be cord edema from compression, but consider also Venous Cord Infarction from septic spinal venous thrombophlebitis.  3. Recommend Neurosurgery, Neurology, and Infectious Disease Consultation. Dr Orvan Falconer recommended at least 6 weeks of IV Cefazolin for MSSA infection, with a discontinuation date of 06/03/2020. She was discharged on Keflex.   She underwent Lumbar One-Two LAMINECTOMY for Epidural  Abscess Evacuation by Dr Franky Macho.   CT Right Shoulder:  IMPRESSION: 1. Soft tissue ulceration on the top of the shoulder with underlying acromioclavicular septic arthritis and osteomyelitis of the acromion and distal clavicle. 2. Small amount of fluid in the subacromial/subdeltoid bursa. Septic bursitis is not excluded. 3. No glenohumeral erosive changes or joint effusion.  She underwent : see below by Dr Eulah Pont on 04/25/2020. Open debridement R AC joint; Distal clavicle repair Right General  ACROMIO-CLAVICULAR JOINT REPAIR Right General  RESECTION DISTAL CLAVICAL      MR Brain:  IMPRESSION: 1. Multifocal nonenhancing T2/FLAIR hyperintense lesions involving the subcortical right greater than left parietal lobes as well as the subcortical parasagittal left frontal lobe as above. Lesions are fairly unusual in appearance, and of uncertain etiology. Differential considerations include changes related to PML, multifocal encephalitis/cerebritis, or possibly multi centric glioma. A possible demyelinating process is considered, although felt to be less likely given appearance. Correlation with CSF analysis may be helpful for further characterization. 2. 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 3. No other acute intracranial abnormality identified.  Debra Schmidt was admitted to Inpatient Rehabilitation on 05/09/2020 and discharged home on 06/02/2020. She has an order to receive Home Health Therapy with [facility]), she states she hasn;t received a call from [facility]. This provider sent an e-mail to Cecile Sheerer LCSW regarding the above, asking Auria to call Debra Schmidt with an update, she verbalizes understanding. Also states she was given a wheelchair that is too small, e-mail sent to [facility] regarding the wheelchair as well, she verbalizes understanding. This provider will speak with Dr Berline Chough regarding the above when she's in the office on  06/09/2020, Debra Schmidt verbalizes understanding.  [name] reports her pain is located in her lower back. She rates her pain 10.   Debra Schmidt Morphine equivalent is 45 MME.    She  is also prescribed Diazepam by [facility]. We have discussed the black box warning of using opioids and benzodiazepines. I highlighted the dangers of using these drugs together and discussed the adverse events including respiratory suppression, overdose, cognitive impairment and importance of compliance with current regimen. We will continue to monitor and adjust as indicated.   Debra Schmidt arrived in wheelchair.   Husband in room, all questions answered. Spoke with Mr. Fredric Mare, answering his questions and was instructed to call to schedule HFU appointments, he verbalizes understanding.     Pain Inventory Average Pain 7 Pain Right Now 10 My pain is constant, stabbing and cramping  In the last 24 hours, has pain interfered with the following? General activity 6 Relation with others 6 Enjoyment of life 6 What TIME of day is your pain at its worst? varies Sleep (in general) Good  Pain is worse with: walking, inactivity and some activites Pain improves with: medication Relief from Meds: 9  Mobility walk with assistance use a walker ability to climb steps?  no do you drive?  no use a wheelchair  Function not employed: date last employed .  Neuro/Psych spasms anxiety  Prior Studies hospital f/u  Physicians involved in your care hospital f/u   Family History  Problem Relation Age of Onset  . Cancer Mother   . Cancer Father    Social History   Socioeconomic History  . Marital status: Legally Separated    Spouse name: Not on file  . Number of children: Not on file  . Years of education: Not on file  . Highest education level: Not on file  Occupational History  . Not on file  Tobacco Use  . Smoking status: Current Every Day Smoker    Packs/day: 0.50    Types: Cigarettes  .  Smokeless tobacco: Never Used  Substance and Sexual Activity  . Alcohol use: Not Currently  . Drug use: Not Currently  . Sexual activity: Not on file  Other Topics Concern  . Not on file  Social History Narrative  . Not on file   Social Determinants of Health   Financial Resource Strain:   . Difficulty of Paying Living Expenses:   Food Insecurity:   . Worried About Programme researcher, broadcasting/film/video in the Last Year:   . Barista in the Last Year:   Transportation Needs:   . Freight forwarder (Medical):   Marland Kitchen Lack of Transportation (Non-Medical):   Physical Activity:   . Days of Exercise per Week:   . Minutes of Exercise per Session:   Stress:   . Feeling of Stress :   Social Connections:   . Frequency of Communication with Friends and Family:   . Frequency of Social Gatherings with Friends and Family:   . Attends Religious Services:   . Active Member of Clubs or Organizations:   . Attends Banker Meetings:   Marland Kitchen Marital Status:    Past Surgical History:  Procedure Laterality Date  . ACROMIO-CLAVICULAR JOINT REPAIR Right 04/25/2020   Procedure: ACROMIO-CLAVICULAR JOINT REPAIR;  Surgeon: Sheral Apley, MD;  Location: [facility] OR;  Service: Orthopedics;  Laterality: Right;  . I & D EXTREMITY Right 04/25/2020   Procedure: Open debridement R AC joint; Distal clavicle repair;  Surgeon: Sheral Apley, MD;  Location:  [facility] OR;  Service: Orthopedics;  Laterality: Right;  . LUMBAR LAMINECTOMY/DECOMPRESSION MICRODISCECTOMY N/A 04/23/2020   Procedure: Lumbar One-Two LAMINECTOMY for Epidural Abscess Evacuation;  Surgeon: Coletta Memos, MD;  Location: [facility] OR;  Service: Neurosurgery;  Laterality: N/A;  . RESECTION DISTAL CLAVICAL Right 04/25/2020   Procedure: RESECTION DISTAL CLAVICAL;  Surgeon: Sheral Apley, MD;  Location: [facility] OR;  Service: Orthopedics;  Laterality: Right;   History reviewed. No pertinent past medical history. BP 112/80   Pulse (!) 123   Temp 98.9 F (37.2 C)    SpO2 97%   Opioid Risk Score:   Fall Risk Score:  `1  Depression screen PHQ 2/9  No flowsheet data found.  Review of Systems  Constitutional: Negative.   HENT: Negative.   Eyes: Negative.   Respiratory: Negative.   Gastrointestinal: Positive for constipation.  Endocrine: Negative.   Genitourinary: Negative.   Musculoskeletal: Positive for back pain and gait problem.       Spasms   Skin: Negative.   Allergic/Immunologic: Negative.   Hematological: Negative.   Psychiatric/Behavioral: The patient is nervous/anxious.   All other systems reviewed and are negative.      Objective:   Physical Exam Vitals and nursing note reviewed.  Constitutional:      Appearance: Normal appearance.  Cardiovascular:     Rate and Rhythm: Normal rate and regular rhythm.     Pulses: Normal pulses.     Heart sounds: Normal heart sounds.  Pulmonary:     Effort: Pulmonary effort is normal.     Breath sounds: Normal breath sounds.  Musculoskeletal:     Cervical back: Normal range of motion and neck supple.     Comments: Normal Muscle Bulk and Muscle Testing Reveals:  Upper Extremities: Full ROM and Muscle Strength 5/5  Lumbar Hypersensitivity Lower Extremities: Full ROM and Muscle Strength 5/5 Arrived in wheelchair  Skin:    General: Skin is warm and dry.  Neurological:     Mental Status: She is alert and oriented to person, place, and time.  Psychiatric:        Mood and Affect: Mood normal.        Behavior: Behavior normal.           Assessment & Plan:  1. Incomplete Paraplegia: E-mail sent to Cecile Sheerer LCSW regarding [facility] ( charity)she verbalizes understanding.Debra Schmidt was instructed to call Dr. Franky Macho office to schedule HFU appointment. She and her husband verbalize understanding. 2. Neurogenic Bladder: Continue with In and Out Cath's, she states she is performing 3 in and out cath's daily, unable to give exact amount of output. Continue to monitor.    3.Neurogenic Bowel: Continue medication regimen. Continue to monitor. 4. History of Hypertension: Controlled at this time. She has a scheduled appointment with PCP. Continue to Monitor.  4.Muscle Spasms of bilateral lower extremities: Continue current medication regimen. Continue to monitor.  5. MSSA Bactremia: She has a scheduled appointment with ID. Continue current medication regimen.  6. Acute Post-Operative Pain: RX: Oxycontin 10 mg one tablet every 12 hours. #46 and Oxycodone 10 mg 0.5- 1 tablet two times daily as needed for pain. #46.  We will continue the opioid monitoring program, this consists of regular clinic visits, examinations, urine drug screen, pill counts as well as use of [place] Controlled Substance Reporting system. We will obtain UDS next visit if Dr Berline Chough continues with Analgesics. If Dr Berline Chough agrees.   7. Septic arthritis of Right Acromioclavicular Joint.: Continue Antibiotics. Has  a scheduled appointment with ID. Continue to Monitor.   F/U with Dr Berline ChoughLovorn in 3 weeks  40 minutes of face to face patient care time was spent during this visit. All questions were encouraged and answered.

## 2020-06-09 ENCOUNTER — Telehealth: Payer: Self-pay | Admitting: *Deleted

## 2020-06-09 NOTE — Telephone Encounter (Signed)
Verbal orders given for  [name] RN with [facility] to visit once a week  for 4 weeks. Follow up Pain management & pain medication management.

## 2020-06-09 NOTE — Telephone Encounter (Signed)
[name] from [facility] called for skilled nursing 1x week for wound care (closed) catheter supplies, med management. Left a message to call back on VM. No identified on VM so no details left.

## 2020-06-22 ENCOUNTER — Other Ambulatory Visit: Payer: Self-pay

## 2020-06-22 ENCOUNTER — Telehealth: Payer: Self-pay | Admitting: Pharmacy Technician

## 2020-06-22 ENCOUNTER — Encounter: Payer: Self-pay | Admitting: Internal Medicine

## 2020-06-22 ENCOUNTER — Ambulatory Visit (INDEPENDENT_AMBULATORY_CARE_PROVIDER_SITE_OTHER): Payer: Medicaid Other | Admitting: Internal Medicine

## 2020-06-22 VITALS — BP 122/82 | HR 132 | Temp 98.7°F

## 2020-06-22 DIAGNOSIS — M009 Pyogenic arthritis, unspecified: Secondary | ICD-10-CM | POA: Diagnosis not present

## 2020-06-22 DIAGNOSIS — G062 Extradural and subdural abscess, unspecified: Secondary | ICD-10-CM | POA: Diagnosis not present

## 2020-06-22 DIAGNOSIS — B182 Chronic viral hepatitis C: Secondary | ICD-10-CM

## 2020-06-22 DIAGNOSIS — M869 Osteomyelitis, unspecified: Secondary | ICD-10-CM | POA: Diagnosis not present

## 2020-06-22 NOTE — Assessment & Plan Note (Addendum)
This was a new finding while in the hospital with confirmatory HCV RNA. Will complete lab work up and get her started on treatment.   Ultrasound done with some fatty liver.  No cirrhosis noted.   Also had MRCP and no obvious cirrhosis.  She will follow up with me in about 5-6 weeks.

## 2020-06-22 NOTE — Telephone Encounter (Signed)
[facility] Patient Advocate Encounter  Met with patient and got needed applications filled out with signatures  [name] [place] [facility] for Infectious Disease Phone: [phone] Fax:  [phone]

## 2020-06-22 NOTE — Assessment & Plan Note (Signed)
Improving well and no clinical concerns. She is going to follow up with Dr. Franky Macho as well.  She will continue with the oral Keflex and I will check inflammatory markers today.  If they are reassuring, she will stop the Keflex after this current refill.   No further follow up for this needed.

## 2020-06-22 NOTE — Assessment & Plan Note (Signed)
This has healed well and good ROM of shoulder and closed incision.

## 2020-06-22 NOTE — Progress Notes (Signed)
   Subjective:    Patient ID: Debra Schmidt, female    DOB: 09/25/1961, 59 y.o.   MRN: [id number]  HPI She is here for hsfu She was hospitalized in June after a fall and found to have disseminated MSSA bacteremia with shoulder septic arthritis and thoracic and lumbar epidural abscess requiring operative debridement with laminectomy by Dr. Franky Macho and treated with prolonged IV cefazolin through 7/24.  She was to follow up on 7/21 but remained in rehab and is here today for follow up.  She also was found to be hepatitis C positive with a positive RNA during the hospitalization.     Review of Systems  Constitutional: Negative for fever.  Gastrointestinal: Negative for diarrhea.  Skin: Negative for rash.       Objective:   Physical Exam Eyes:     General: No scleral icterus. Pulmonary:     Effort: Pulmonary effort is normal.  Musculoskeletal:     Comments: Right shoulder with normal ROM  Neurological:     Mental Status: She is alert.   SH: + tobacco        Assessment & Plan:

## 2020-06-23 ENCOUNTER — Ambulatory Visit: Payer: MEDICAID | Attending: Internal Medicine | Admitting: Internal Medicine

## 2020-06-23 ENCOUNTER — Telehealth: Payer: Self-pay | Admitting: *Deleted

## 2020-06-23 DIAGNOSIS — K5900 Constipation, unspecified: Secondary | ICD-10-CM

## 2020-06-23 DIAGNOSIS — D649 Anemia, unspecified: Secondary | ICD-10-CM

## 2020-06-23 DIAGNOSIS — M4646 Discitis, unspecified, lumbar region: Secondary | ICD-10-CM | POA: Diagnosis not present

## 2020-06-23 DIAGNOSIS — M869 Osteomyelitis, unspecified: Secondary | ICD-10-CM

## 2020-06-23 DIAGNOSIS — Z87891 Personal history of nicotine dependence: Secondary | ICD-10-CM

## 2020-06-23 DIAGNOSIS — B182 Chronic viral hepatitis C: Secondary | ICD-10-CM

## 2020-06-23 DIAGNOSIS — Z09 Encounter for follow-up examination after completed treatment for conditions other than malignant neoplasm: Secondary | ICD-10-CM

## 2020-06-23 DIAGNOSIS — N319 Neuromuscular dysfunction of bladder, unspecified: Secondary | ICD-10-CM

## 2020-06-23 DIAGNOSIS — M62838 Other muscle spasm: Secondary | ICD-10-CM

## 2020-06-23 DIAGNOSIS — M4626 Osteomyelitis of vertebra, lumbar region: Secondary | ICD-10-CM | POA: Diagnosis not present

## 2020-06-23 DIAGNOSIS — Z7901 Long term (current) use of anticoagulants: Secondary | ICD-10-CM

## 2020-06-23 DIAGNOSIS — G8222 Paraplegia, incomplete: Secondary | ICD-10-CM

## 2020-06-23 DIAGNOSIS — E876 Hypokalemia: Secondary | ICD-10-CM

## 2020-06-23 DIAGNOSIS — G061 Intraspinal abscess and granuloma: Secondary | ICD-10-CM

## 2020-06-23 DIAGNOSIS — K861 Other chronic pancreatitis: Secondary | ICD-10-CM | POA: Diagnosis not present

## 2020-06-23 DIAGNOSIS — M4656 Other infective spondylopathies, lumbar region: Secondary | ICD-10-CM

## 2020-06-23 DIAGNOSIS — B9561 Methicillin susceptible Staphylococcus aureus infection as the cause of diseases classified elsewhere: Secondary | ICD-10-CM | POA: Diagnosis not present

## 2020-06-23 DIAGNOSIS — K592 Neurogenic bowel, not elsewhere classified: Secondary | ICD-10-CM

## 2020-06-23 DIAGNOSIS — Z9181 History of falling: Secondary | ICD-10-CM

## 2020-06-23 DIAGNOSIS — E46 Unspecified protein-calorie malnutrition: Secondary | ICD-10-CM

## 2020-06-23 DIAGNOSIS — M48061 Spinal stenosis, lumbar region without neurogenic claudication: Secondary | ICD-10-CM

## 2020-06-23 DIAGNOSIS — Z79891 Long term (current) use of opiate analgesic: Secondary | ICD-10-CM

## 2020-06-23 DIAGNOSIS — G629 Polyneuropathy, unspecified: Secondary | ICD-10-CM

## 2020-06-23 DIAGNOSIS — I10 Essential (primary) hypertension: Secondary | ICD-10-CM

## 2020-06-23 DIAGNOSIS — R930 Abnormal findings on diagnostic imaging of skull and head, not elsewhere classified: Secondary | ICD-10-CM | POA: Diagnosis not present

## 2020-06-23 DIAGNOSIS — R937 Abnormal findings on diagnostic imaging of other parts of musculoskeletal system: Secondary | ICD-10-CM

## 2020-06-23 DIAGNOSIS — M4804 Spinal stenosis, thoracic region: Secondary | ICD-10-CM

## 2020-06-23 DIAGNOSIS — Z792 Long term (current) use of antibiotics: Secondary | ICD-10-CM

## 2020-06-23 MED ORDER — BACLOFEN 5 MG PO TABS: 5.0000 mg | ORAL_TABLET | Freq: Three times a day (TID) | ORAL | 5 refills | Status: DC

## 2020-06-23 MED ORDER — OXYCODONE HCL 10 MG PO TABS: 5.0000 mg | ORAL_TABLET | Freq: Two times a day (BID) | ORAL | 0 refills | Status: DC | PRN

## 2020-06-23 MED ORDER — OXYCODONE HCL ER 10 MG PO T12A: 10.0000 mg | EXTENDED_RELEASE_TABLET | Freq: Two times a day (BID) | ORAL | 0 refills | Status: DC

## 2020-06-23 NOTE — Telephone Encounter (Signed)
Debra Schmidt called at 3:42 to request refills on her pain medication. Per PMP both were last filled 06/08/20.

## 2020-06-23 NOTE — Telephone Encounter (Signed)
Refilled pain meds- as well as increased baclofen to 5 mg 3x/day- 5 RFs

## 2020-06-23 NOTE — Progress Notes (Signed)
Virtual Visit via Telephone Note Due to current restrictions/limitations of in-office visits due to the COVID-19 pandemic, this scheduled clinical appointment was converted to a telehealth visit.  This was supposed to be an in person visit but patient decided to make it a telephone visit because her sister-in-law who was supposed to be her transportation was sick.  I connected with Debra Schmidt on 06/23/20 at 3:05 p.m by telephone and verified that I am speaking with the correct person using two identifiers.  I am in my office.  The patient is at home.  Only the patient and myself participated in this encounter.  I discussed the limitations, risks, security and privacy concerns of performing an evaluation and management service by telephone and the availability of in person appointments. I also discussed with the patient that there may be a patient responsible charge related to this service. The patient expressed understanding and agreed to proceed.   History of Present Illness: Patient with history of tob dep, , incomplete paraplegia, L1-L2 discitis with osteomyelitis, intra-spinal abscess s/p laminectomy L1-L2, chronic hep C, neurogenic bladder, osteomyelitis of right clavicle,   Purpose of today's visit is to establish care and for hospital follow-up. Patient was admitted to the hospital 6/13-29/2021 with worsening back pain and spasms, urinary incontinence with lower extremity weakness.  She was found to have symptomatic epidural abscess with discitis in the L1-L2 region.  She was taken to the operating room urgently and underwent L1-L2 laminectomy with evacuation of epidural abscess.  She was also found to have osteomyelitis of the right shoulder and underwent open debridement and distal clavicle repair.  2D echo revealed normal ejection fraction with no valvular abnormality.  She was followed by infectious disease.  She was treated for 6 weeks with cefazolin IV for disseminated MSSA infection.   End date of antibiotics was 06/03/2020.  Patient was sent to inpatient rehab unit from 6/29-7/23/2021.  Hospital course complicated with abdominal pain.  Found to have acute on chronic pancreatitis on imaging study.  Had MRCP  By Dr. Marca Ancona.  This revealed acute on chronic pancreatitis, pancreatic divisum and mild biliary dilatation of 1 cm but no definite choledocholithiasis.  [facility] was consulted and recommended outpatient follow-up for possible laparoscopic cholecystectomy in the future.  She was diagnosed with chronic hepatitis C.  RPR positive but treponema palladium antibody nonreactive.  She did develop episodes of hallucination and lethargy.  MRI of the brain was done to rule out stroke.  This showed multiple nonenhancing hyperintense lesions involving the subcortical right greater than left parietal lobe as well as subcortical parasagittal left frontal lobe of uncertain etiology.  She was seen by the neurologist.  ID did not feel that the changes were secondary to septic emboli and no change was made in antibiotic regimen.  Plan is for repeat MRI of the head with contrast in 2 to 3 weeks.  Today:  Discitis/osteomyelitis: Since hospital discharge she has followed up with physical medicine and rehab.  They are managing her pain medication.  She has also seen the infectious disease specialist Dr. Lucianne Muss yesterday.  She appears to be healing well.  She is currently on oral antibiotics.  She is receiving home physical therapy 2 times a week.   -Still has weakness in her legs and gets a lot of muscle spasms. Has follow-up with the neurosurgeon Dr. Franky Macho next Wednesday. -In regards to the neurogenic bladder, she reports that she is not having to straight cath herself as much  anymore as some of her bladder function is returning. -She was a smoker but states she has quit since leaving the hospital. -She is wanting refills on her medications sent to our  pharmacy.   Observations/Objective:  Lab Results  Component Value Date   WBC 7.6 06/22/2020   HGB 14.5 06/22/2020   HCT 44.9 06/22/2020   MCV 86.3 06/22/2020   PLT 389 06/22/2020     Chemistry      Component Value Date/Time   NA 137 06/22/2020 0955   K 4.4 06/22/2020 0955   CL 100 06/22/2020 0955   CO2 27 06/22/2020 0955   BUN 10 06/22/2020 0955   CREATININE 0.71 06/22/2020 0955      Component Value Date/Time   CALCIUM 10.5 (H) 06/22/2020 0955   ALKPHOS 80 05/10/2020 0500   AST 27 06/22/2020 0955   ALT 20 06/22/2020 0955   BILITOT 0.3 06/22/2020 0955      Assessment and Plan: 1. Hospital discharge follow-up   2. Discitis of lumbar region/intraspinal abscess 3. Osteomyelitis of clavicle (HCC) -Followed by infectious disease.  She saw Dr. Luciana Axe in follow-up yesterday.  Plan reviewed. -She remains on Lovenox for DVT prophylaxis.  Per discharge note plan is to keep her on it for a total of about 2 months.  4. Neurogenic bladder She is regaining some of her bladder function.  She will use the straight cath when she is not able to urinate.  5. Chronic hepatitis C without hepatic coma (HCC) Plan for treatment through ID  6. Former smoker Commended her on quitting.  Encouraged her to remain tobacco free  7. Chronic pancreatitis, unspecified pancreatitis type (HCC) We will have her follow-up with GI once current infections have been treated  8. Multiple lesions on CT of brain and spine - MR BRAIN W CONTRAST; Future   Follow Up Instructions:   6 wks I discussed the assessment and treatment plan with the patient. The patient was provided an opportunity to ask questions and all were answered. The patient agreed with the plan and demonstrated an understanding of the instructions.   The patient was advised to call back or seek an in-person evaluation if the symptoms worsen or if the condition fails to improve as anticipated.  I provided 20 minutes of non-face-to-face  time during this encounter.   Jonah Blue, MD

## 2020-06-25 ENCOUNTER — Other Ambulatory Visit: Payer: Self-pay | Admitting: Internal Medicine

## 2020-06-25 MED ORDER — POLYETHYLENE GLYCOL 3350 17 G PO PACK: 17.0000 g | PACK | Freq: Every day | ORAL | 2 refills | Status: DC | PRN

## 2020-06-25 MED ORDER — DOCUSATE SODIUM 100 MG PO CAPS: 200.0000 mg | ORAL_CAPSULE | Freq: Every day | ORAL | 3 refills | Status: DC

## 2020-06-25 MED ORDER — MAGNESIUM OXIDE 400 (241.3 MG) MG PO TABS: 200.0000 mg | ORAL_TABLET | Freq: Two times a day (BID) | ORAL | 2 refills | Status: DC

## 2020-06-25 MED ORDER — POTASSIUM CHLORIDE CRYS ER 20 MEQ PO TBCR: 40.0000 meq | EXTENDED_RELEASE_TABLET | Freq: Every day | ORAL | 0 refills | Status: DC

## 2020-06-25 MED ORDER — PANTOPRAZOLE SODIUM 40 MG PO TBEC: 40.0000 mg | DELAYED_RELEASE_TABLET | Freq: Every day | ORAL | 3 refills | Status: DC

## 2020-06-25 MED ORDER — ADULT MULTIVITAMIN W/MINERALS CH: 1.0000 | ORAL_TABLET | Freq: Every day | ORAL | 1 refills | Status: DC

## 2020-06-25 MED ORDER — DICLOFENAC EPOLAMINE 1.3 % EX PTCH: 1.0000 | MEDICATED_PATCH | Freq: Two times a day (BID) | CUTANEOUS | 1 refills | Status: DC

## 2020-06-26 MED FILL — MAGNESIUM OXIDE 400 MG TAB: 400 | 30 days supply | Qty: 30 | Fill #0

## 2020-06-26 MED FILL — DICLOFENAC EPOLAMINE 1.3 %: 1.3 | 30 days supply | Qty: 60 | Fill #0

## 2020-06-27 ENCOUNTER — Other Ambulatory Visit: Payer: Self-pay | Admitting: Internal Medicine

## 2020-06-27 LAB — CBC WITH DIFFERENTIAL/PLATELET
Absolute Monocytes: 532 cells/uL (ref 200–950)
Basophils Absolute: 91 cells/uL (ref 0–200)
Basophils Relative: 1.2 %
Eosinophils Absolute: 350 cells/uL (ref 15–500)
Eosinophils Relative: 4.6 %
HCT: 44.9 % (ref 35.0–45.0)
Hemoglobin: 14.5 g/dL (ref 11.7–15.5)
Lymphs Abs: 1794 cells/uL (ref 850–3900)
MCH: 27.9 pg (ref 27.0–33.0)
MCHC: 32.3 g/dL (ref 32.0–36.0)
MCV: 86.3 fL (ref 80.0–100.0)
MPV: 9.7 fL (ref 7.5–12.5)
Monocytes Relative: 7 %
Neutro Abs: 4834 cells/uL (ref 1500–7800)
Neutrophils Relative %: 63.6 %
Platelets: 389 10*3/uL (ref 140–400)
RBC: 5.2 10*6/uL — ABNORMAL HIGH (ref 3.80–5.10)
RDW: 14.3 % (ref 11.0–15.0)
Total Lymphocyte: 23.6 %
WBC: 7.6 10*3/uL (ref 3.8–10.8)

## 2020-06-27 LAB — COMPLETE METABOLIC PANEL WITH GFR
AG Ratio: 0.9 (calc) — ABNORMAL LOW (ref 1.0–2.5)
ALT: 20 U/L (ref 6–29)
AST: 27 U/L (ref 10–35)
Albumin: 4.2 g/dL (ref 3.6–5.1)
Alkaline phosphatase (APISO): 148 U/L (ref 37–153)
BUN: 10 mg/dL (ref 7–25)
CO2: 27 mmol/L (ref 20–32)
Calcium: 10.5 mg/dL — ABNORMAL HIGH (ref 8.6–10.4)
Chloride: 100 mmol/L (ref 98–110)
Creat: 0.71 mg/dL (ref 0.50–1.05)
GFR, Est African American: 108 mL/min/{1.73_m2} (ref >=60)
GFR, Est Non African American: 93 mL/min/{1.73_m2} (ref >=60)
Globulin: 4.5 g/dL (calc) — ABNORMAL HIGH (ref 1.9–3.7)
Glucose, Bld: 153 mg/dL — ABNORMAL HIGH (ref 65–99)
Potassium: 4.4 mmol/L (ref 3.5–5.3)
Sodium: 137 mmol/L (ref 135–146)
Total Bilirubin: 0.3 mg/dL (ref 0.2–1.2)
Total Protein: 8.7 g/dL — ABNORMAL HIGH (ref 6.1–8.1)

## 2020-06-27 LAB — LIVER FIBROSIS, FIBROTEST-ACTITEST
ALT: 20 U/L (ref 6–29)
Alpha-2-Macroglobulin: 342 mg/dL — ABNORMAL HIGH (ref 106–279)
Apolipoprotein A1: 141 mg/dL (ref 101–198)
Bilirubin: 0.2 mg/dL (ref 0.2–1.2)
Fibrosis Score: 0.24
GGT: 31 U/L (ref 3–70)
Haptoglobin: 209 mg/dL (ref 43–212)
Necroinflammat ACT Score: 0.07
Reference ID: 3514200

## 2020-06-27 LAB — HIV ANTIBODY (ROUTINE TESTING W REFLEX): HIV 1&2 Ab, 4th Generation: NONREACTIVE

## 2020-06-27 LAB — HEPATITIS A ANTIBODY, TOTAL: Hepatitis A AB,Total: NONREACTIVE

## 2020-06-27 LAB — HEPATITIS B CORE ANTIBODY, TOTAL: Hep B Core Total Ab: REACTIVE — AB

## 2020-06-27 LAB — C-REACTIVE PROTEIN: CRP: 6.6 mg/L (ref <8.0)

## 2020-06-27 LAB — SEDIMENTATION RATE: Sed Rate: 28 mm/h (ref 0–30)

## 2020-06-27 LAB — HEPATITIS C GENOTYPE

## 2020-06-27 LAB — HEPATITIS B SURFACE ANTIBODY,QUALITATIVE: Hep B S Ab: REACTIVE — AB

## 2020-06-27 LAB — HEPATITIS B SURFACE ANTIGEN: Hepatitis B Surface Ag: NONREACTIVE

## 2020-06-27 NOTE — Addendum Note (Signed)
Addended by: Jonah Blue B on: 06/27/2020 03:09 PM   Modules accepted: Orders

## 2020-06-28 ENCOUNTER — Other Ambulatory Visit: Payer: Self-pay | Admitting: Internal Medicine

## 2020-06-28 ENCOUNTER — Telehealth: Payer: Self-pay | Admitting: Pharmacy Technician

## 2020-06-28 DIAGNOSIS — B182 Chronic viral hepatitis C: Secondary | ICD-10-CM

## 2020-06-28 MED ORDER — MAVYRET 100-40 MG PO TABS: 3.0000 | ORAL_TABLET | Freq: Every day | ORAL | 1 refills | Status: DC

## 2020-06-28 NOTE — Telephone Encounter (Addendum)
[facility] Patient Advocate Encounter  Completed and sent MYABBVIE application for Mavyret for this patient who is uninsured.    Patient assistance phone number for follow up is [phone].   Status is APPROVED.  Patient prefers medication be shipped to [facility] for pick up.  We will continue to follow.   Netty Starring. Dimas Aguas CPhT Specialty Pharmacy Patient [facility] for Infectious Disease Phone: [phone] Fax:  [phone]

## 2020-06-29 ENCOUNTER — Telehealth: Payer: Self-pay

## 2020-06-29 NOTE — Telephone Encounter (Signed)
Debra Schmidt has not received a call from Neurology, Neuro Surgery, or [facility]. For hospital discharge services.    Please advise patient.   Call back ph# [phone].

## 2020-06-29 NOTE — Progress Notes (Unsigned)
Sw received follow-up office from Rehab clinic staff reporting pt states she has not received any updates about HH and no follow-up from neurology. SW received updates from [facility] reporting pt has been receiving services, beginning on 7/30. Pt was a poor historian/alot of confusion at times while in the hospital, and best contacts are the husband Bethann Berkshire or sister in Retail buyer. SW also informed clinical staff SW does not schedule neurology appointments.   SW spoke with Lucy/sisterin law [phone]) to discuss above. SW reviewed current listed appointments, and provided contact information for neurology. She also reports that she does continue to help with pt care but not there all the time. She supports pt continues to present with confusion at times. She asked about a new w/c. SW instructed to follow-up with [facility]. Reports that [facility] informed her the physician would need to put in a new order. States that this w/c is too small. SW informed this was charity, and encouraged her to address again when in clinic and see what options are available.

## 2020-06-29 NOTE — Telephone Encounter (Signed)
Last night Debra Schmidt  experienced tingling & numbness in her lower back and legs. It was as if the areas went to sleep. After movement the sensation went away. She was sitting at the table when the event happen.   Please advise.   (Patient had not been contacted by [facility], Neuro Surgery or Neurology. Message sent to Auria SW for follow up appointments.)

## 2020-06-29 NOTE — Telephone Encounter (Signed)
Actually, pt has seen [facility] according to family- have a note from inpt SW telling me and I've had H/H across my desk saying they've seen her- she doesn't remember. and pt having a lot of confusion, per family/SW- she needs to see Neurology, which I thought they made appointment when she left Rehab inpatient.   I don't have an easy answer, but when see her in clinic, will be able to give her more of an answer, since can examine her. Thank you

## 2020-07-03 ENCOUNTER — Telehealth: Payer: Self-pay | Admitting: *Deleted

## 2020-07-03 ENCOUNTER — Encounter: Payer: Medicaid Other | Attending: Registered Nurse | Admitting: Physical Medicine and Rehabilitation

## 2020-07-03 ENCOUNTER — Encounter: Payer: Self-pay | Admitting: Physical Medicine and Rehabilitation

## 2020-07-03 ENCOUNTER — Other Ambulatory Visit: Payer: Self-pay

## 2020-07-03 DIAGNOSIS — B9561 Methicillin susceptible Staphylococcus aureus infection as the cause of diseases classified elsewhere: Secondary | ICD-10-CM | POA: Insufficient documentation

## 2020-07-03 DIAGNOSIS — G062 Extradural and subdural abscess, unspecified: Secondary | ICD-10-CM | POA: Diagnosis not present

## 2020-07-03 DIAGNOSIS — G8918 Other acute postprocedural pain: Secondary | ICD-10-CM | POA: Insufficient documentation

## 2020-07-03 DIAGNOSIS — Z8679 Personal history of other diseases of the circulatory system: Secondary | ICD-10-CM | POA: Insufficient documentation

## 2020-07-03 DIAGNOSIS — M792 Neuralgia and neuritis, unspecified: Secondary | ICD-10-CM | POA: Diagnosis not present

## 2020-07-03 DIAGNOSIS — M009 Pyogenic arthritis, unspecified: Secondary | ICD-10-CM | POA: Insufficient documentation

## 2020-07-03 DIAGNOSIS — N319 Neuromuscular dysfunction of bladder, unspecified: Secondary | ICD-10-CM | POA: Insufficient documentation

## 2020-07-03 DIAGNOSIS — M62838 Other muscle spasm: Secondary | ICD-10-CM | POA: Insufficient documentation

## 2020-07-03 DIAGNOSIS — G8222 Paraplegia, incomplete: Secondary | ICD-10-CM | POA: Insufficient documentation

## 2020-07-03 DIAGNOSIS — R7881 Bacteremia: Secondary | ICD-10-CM | POA: Insufficient documentation

## 2020-07-03 DIAGNOSIS — K592 Neurogenic bowel, not elsewhere classified: Secondary | ICD-10-CM | POA: Insufficient documentation

## 2020-07-03 MED FILL — POTASSIUM CL ER 20 MEQ TAB: 20 | 30 days supply | Qty: 60 | Fill #0

## 2020-07-03 MED FILL — PANTOPRAZOLE SOD DR 40 MG T: 40 | 30 days supply | Qty: 30 | Fill #0

## 2020-07-03 MED FILL — DICLOFENAC EPOLAMINE 1.3 %: 1.3 | 30 days supply | Qty: 60 | Fill #0

## 2020-07-03 MED FILL — MAGNESIUM OXIDE 400 MG TAB: 400 | 30 days supply | Qty: 30 | Fill #0

## 2020-07-03 MED FILL — oxyCODONE HCL 10 MG TABS: 10 | 30 days supply | Qty: 60 | Fill #0

## 2020-07-03 NOTE — Progress Notes (Signed)
Subjective:    Patient ID: Debra Schmidt, female    DOB: 05-Sep-1961, 59 y.o.   MRN: [id number]  HPI    Due to national recommendations of social distancing because of COVID 74, an audio/video tele-health visit is felt to be the most appropriate encounter for this patient at this time. See MyChart message from today for the patient's consent to a tele-health encounter with [facility] Physical Medicine & Rehabilitation. This is a follow up tele-visit via Webex. The patient is at home. MD is at office.  Has been exposed to COVID, so cannot come to visit this week.    Patient with history of tob dep, , incomplete paraplegia, L1-L2 discitis with osteomyelitis, intra-spinal abscess s/p laminectomy L1-L2, chronic hep C, neurogenic bladder, osteomyelitis of right clavicle,  Got therapy-  Girl that came last week, someone walking with her- with RW- did well.   Son staying with her during the day- Christiane Ha.   Only had 1 nurse came out. But PT and OT have come out 4-5x total.   Still gets sore, when walks too much and wobbly when walked to much.   Bowel- was constipated for 1-2 weeks- started Miralax- 1 capful/day- mixes in water- works well and takes most days- unless doesn't need it.    Bladder- hasn't had to cath more than 1x in last 2-3 times-    Has 4 of Lovenox shots left.    Muscle spasms- comes and goes- usually hits when works out too much. When overdoes it and back will hurt    Pain Inventory Average Pain 10 Pain Right Now 8 My pain is aching  In the last 24 hours, has pain interfered with the following? General activity 8 Relation with others 8 Enjoyment of life 8 What TIME of day is your pain at its worst? evening and night Sleep (in general) Fair  Pain is worse with: sitting and some activites Pain improves with: medication Relief from Meds: 7  Family History  Problem Relation Age of Onset  . Cancer Mother   . Cancer Father    Social History    Socioeconomic History  . Marital status: Legally Separated    Spouse name: Not on file  . Number of children: Not on file  . Years of education: Not on file  . Highest education level: Not on file  Occupational History  . Not on file  Tobacco Use  . Smoking status: Current Every Day Smoker    Packs/day: 0.30    Types: Cigarettes  . Smokeless tobacco: Never Used  Substance and Sexual Activity  . Alcohol use: Not Currently  . Drug use: Not Currently  . Sexual activity: Not on file  Other Topics Concern  . Not on file  Social History Narrative  . Not on file   Social Determinants of Health   Financial Resource Strain:   . Difficulty of Paying Living Expenses: Not on file  Food Insecurity:   . Worried About Programme researcher, broadcasting/film/video in the Last Year: Not on file  . Ran Out of Food in the Last Year: Not on file  Transportation Needs:   . Lack of Transportation (Medical): Not on file  . Lack of Transportation (Non-Medical): Not on file  Physical Activity:   . Days of Exercise per Week: Not on file  . Minutes of Exercise per Session: Not on file  Stress:   . Feeling of Stress : Not on file  Social Connections:   .  Frequency of Communication with Friends and Family: Not on file  . Frequency of Social Gatherings with Friends and Family: Not on file  . Attends Religious Services: Not on file  . Active Member of Clubs or Organizations: Not on file  . Attends Banker Meetings: Not on file  . Marital Status: Not on file   Past Surgical History:  Procedure Laterality Date  . ACROMIO-CLAVICULAR JOINT REPAIR Right 04/25/2020   Procedure: ACROMIO-CLAVICULAR JOINT REPAIR;  Surgeon: Sheral Apley, MD;  Location: [facility] OR;  Service: Orthopedics;  Laterality: Right;  . I & D EXTREMITY Right 04/25/2020   Procedure: Open debridement R AC joint; Distal clavicle repair;  Surgeon: Sheral Apley, MD;  Location: [facility] OR;  Service: Orthopedics;  Laterality: Right;  . LUMBAR  LAMINECTOMY/DECOMPRESSION MICRODISCECTOMY N/A 04/23/2020   Procedure: Lumbar One-Two LAMINECTOMY for Epidural Abscess Evacuation;  Surgeon: Coletta Memos, MD;  Location: [facility] OR;  Service: Neurosurgery;  Laterality: N/A;  . RESECTION DISTAL CLAVICAL Right 04/25/2020   Procedure: RESECTION DISTAL CLAVICAL;  Surgeon: Sheral Apley, MD;  Location: [facility] OR;  Service: Orthopedics;  Laterality: Right;   Past Surgical History:  Procedure Laterality Date  . ACROMIO-CLAVICULAR JOINT REPAIR Right 04/25/2020   Procedure: ACROMIO-CLAVICULAR JOINT REPAIR;  Surgeon: Sheral Apley, MD;  Location: [facility] OR;  Service: Orthopedics;  Laterality: Right;  . I & D EXTREMITY Right 04/25/2020   Procedure: Open debridement R AC joint; Distal clavicle repair;  Surgeon: Sheral Apley, MD;  Location[facility] OR;  Service: Orthopedics;  Laterality: Right;  . LUMBAR LAMINECTOMY/DECOMPRESSION MICRODISCECTOMY N/A 04/23/2020   Procedure: Lumbar One-Two LAMINECTOMY for Epidural Abscess Evacuation;  Surgeon: Coletta Memos, MD;  Location: [facility] OR;  Service: Neurosurgery;  Laterality: N/A;  . RESECTION DISTAL CLAVICAL Right 04/25/2020   Procedure: RESECTION DISTAL CLAVICAL;  Surgeon: Sheral Apley, MD;  Location: [facility] OR;  Service: Orthopedics;  Laterality: Right;   No past medical history on file. There were no vitals taken for this visit.  Opioid Risk Score:   Fall Risk Score:  `1  Depression screen PHQ 2/9  Depression screen PHQ 2/9 06/08/2020  Decreased Interest 0  Down, Depressed, Hopeless 0  PHQ - 2 Score 0  Altered sleeping 0  Tired, decreased energy 0  Change in appetite 0  Feeling bad or failure about yourself  0  Trouble concentrating 0  Moving slowly or fidgety/restless 0  Suicidal thoughts 0  PHQ-9 Score 0    Review of Systems  Neurological: Positive for tremors.       Tingling  All other systems reviewed and are negative.      Objective:   Physical Exam  webex      Assessment & Plan:     Patient with history of tob dep (start smoking!!!) , incomplete paraplegia, L1-L2 discitis with osteomyelitis, intra-spinal abscess s/p laminectomy L1-L2, chronic hep C, neurogenic bladder, osteomyelitis of right clavicle, S/p IV ABX as of 06/03/20   1. Bladder return has occurred- not cathing  2. Hasn't refilled meds from 8/13- Oxycodone, Oxycontin and Baclofen called in.  [facility] Will renew monthly  3. con't Miralax as needed-  For constipation.   4. F/u 2 months  I spent a total of 20 minutes on visit- as detailed above.

## 2020-07-03 NOTE — Patient Instructions (Signed)
Patient with history of tob dep (start smoking!!!) , incomplete paraplegia, L1-L2 discitis with osteomyelitis, intra-spinal abscess s/p laminectomy L1-L2, chronic hep C, neurogenic bladder, osteomyelitis of right clavicle, S/p IV ABX as of 06/03/20   1. Bladder return has occurred- not cathing- con't to void and call if problems  2. Hasn't refilled meds from 8/13- Oxycodone, Oxycontin and Baclofen called in.  [facility] Will renew monthly  3. con't Miralax as needed-  For constipation.   4. F/u 2 months

## 2020-07-03 NOTE — Telephone Encounter (Signed)
Debra Schmidt called multiple times about her medications.  [facility] has the oxycodone 10 mg #60 for $19.33  The Oxycontin 10 mg they only have #44 and it will be $345 for the #60.  I have looked at good rx and she can get it for less at [facility] with coupon. I have spoken with Debra Schmidt and she is going to go ahead and get the #44 at [facility] because her significant other is on his way to get it. She will eventually need to call us for the remainder of the rx (#22) to be sent over when she needs it

## 2020-07-04 NOTE — Telephone Encounter (Signed)
Ok thanks for letting me know

## 2020-07-04 NOTE — Telephone Encounter (Signed)
Return Debra Schmidt call, she states she has an appointment with the Neurosurgeon and is walking short distances. Also reports she is scheduled for MRI.

## 2020-07-05 ENCOUNTER — Ambulatory Visit (HOSPITAL_COMMUNITY): Admission: RE | Admit: 2020-07-05 | Payer: Self-pay | Source: Ambulatory Visit

## 2020-07-10 ENCOUNTER — Telehealth: Payer: Self-pay

## 2020-07-10 MED ORDER — OXYCODONE HCL ER 10 MG PO T12A: 10.0000 mg | EXTENDED_RELEASE_TABLET | Freq: Two times a day (BID) | ORAL | 0 refills | Status: DC

## 2020-07-10 NOTE — Telephone Encounter (Signed)
Oxycontin 10mg  BID  #60 sent to pharmacy- [facility] [address] Per pt request.

## 2020-07-10 NOTE — Telephone Encounter (Signed)
Patient has asked to send Oxycontin 10 mg 12hr to [facility]. She can get it cheaper there. Cancelled prescription at [facility].

## 2020-07-10 NOTE — Telephone Encounter (Signed)
Patient never picked prescription at [facility]. I called and they still had it on hold for her, I told them to delete it.

## 2020-07-10 NOTE — Telephone Encounter (Signed)
She said she was going to get at [facility] last week- I spoke to Longoria directly about this issue-and she said pt was picking up that day- can we figure out if she actually picked it up?  Then I know how pills to send in? Thanks

## 2020-07-12 ENCOUNTER — Telehealth: Payer: Self-pay | Admitting: *Deleted

## 2020-07-12 NOTE — Telephone Encounter (Addendum)
Debra Schmidt called and says she cannot afford the oxycontin ($300) and they will not take coupons for narcotics.  I called her back and she said she is willing to try something else --maybe morphine?  I explained to her that Dr Berline Chough is off the rest of the day and will not address until tomorrow.  The oxycontin will need to be cancelled if it is ultimately changed.

## 2020-07-13 NOTE — Telephone Encounter (Signed)
Called and left message for pt- didn't answer x2-  Explained to let me know if she wants the MS contin- which is Morphine - can call in 15 mg 2x/day to the pharmacy of her choice- just let me know- I asked her to call the clinic back to let me know.

## 2020-07-13 NOTE — Telephone Encounter (Signed)
Cordelia Pen called back yesterday before office closed and asked that she be called by Dr Berline Chough before she orders a different medication for the oxycodone.  [phone].

## 2020-07-14 ENCOUNTER — Other Ambulatory Visit: Payer: Self-pay

## 2020-07-14 ENCOUNTER — Ambulatory Visit (HOSPITAL_COMMUNITY)
Admission: RE | Admit: 2020-07-14 | Discharge: 2020-07-14 | Disposition: A | Discharge status: Home / Self Care | Payer: Medicaid Other | Source: Ambulatory Visit | Attending: Internal Medicine | Admitting: Internal Medicine

## 2020-07-14 DIAGNOSIS — R930 Abnormal findings on diagnostic imaging of skull and head, not elsewhere classified: Secondary | ICD-10-CM

## 2020-07-14 DIAGNOSIS — R937 Abnormal findings on diagnostic imaging of other parts of musculoskeletal system: Secondary | ICD-10-CM | POA: Diagnosis not present

## 2020-07-14 IMAGING — MR MR HEAD WO/W CM
3 of 11 series · 6 of 48 positions shown · IV contrast (gadavist)
Comparison: Brain MRI [DATE]

CLINICAL DATA: Multiple brain lesions.

EXAM:
MRI HEAD WITHOUT AND WITH CONTRAST
TECHNIQUE: Multiplanar, multiecho pulse sequences of the brain and surrounding
structures were obtained without and with intravenous contrast.
CONTRAST:  5mL GADAVIST GADOBUTROL 1 MMOL/ML IV SOLN

[Series 4: FLAIR · sagittal · 5.0mm · 0.23mm/px · 3 of 22 slices shown (1 of 2)]
[im 1/22]
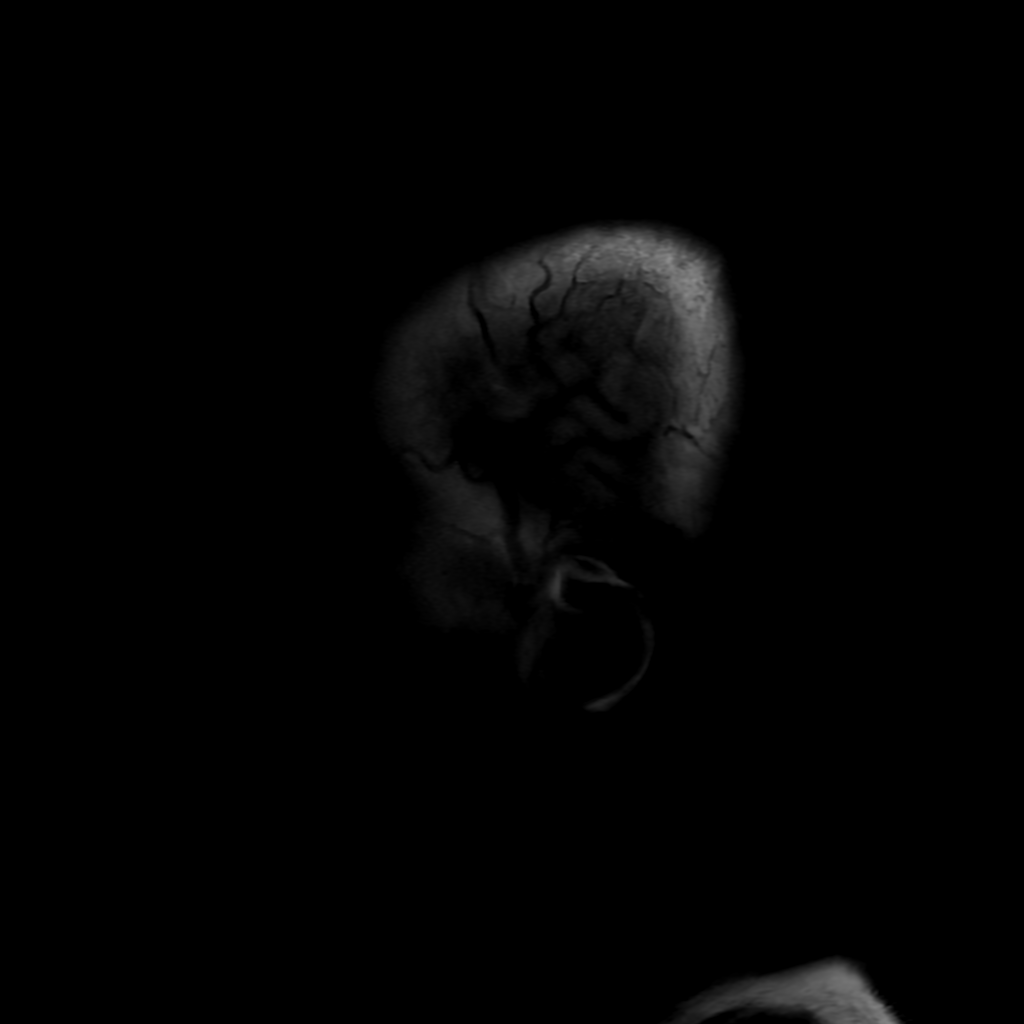
[im 11/22]
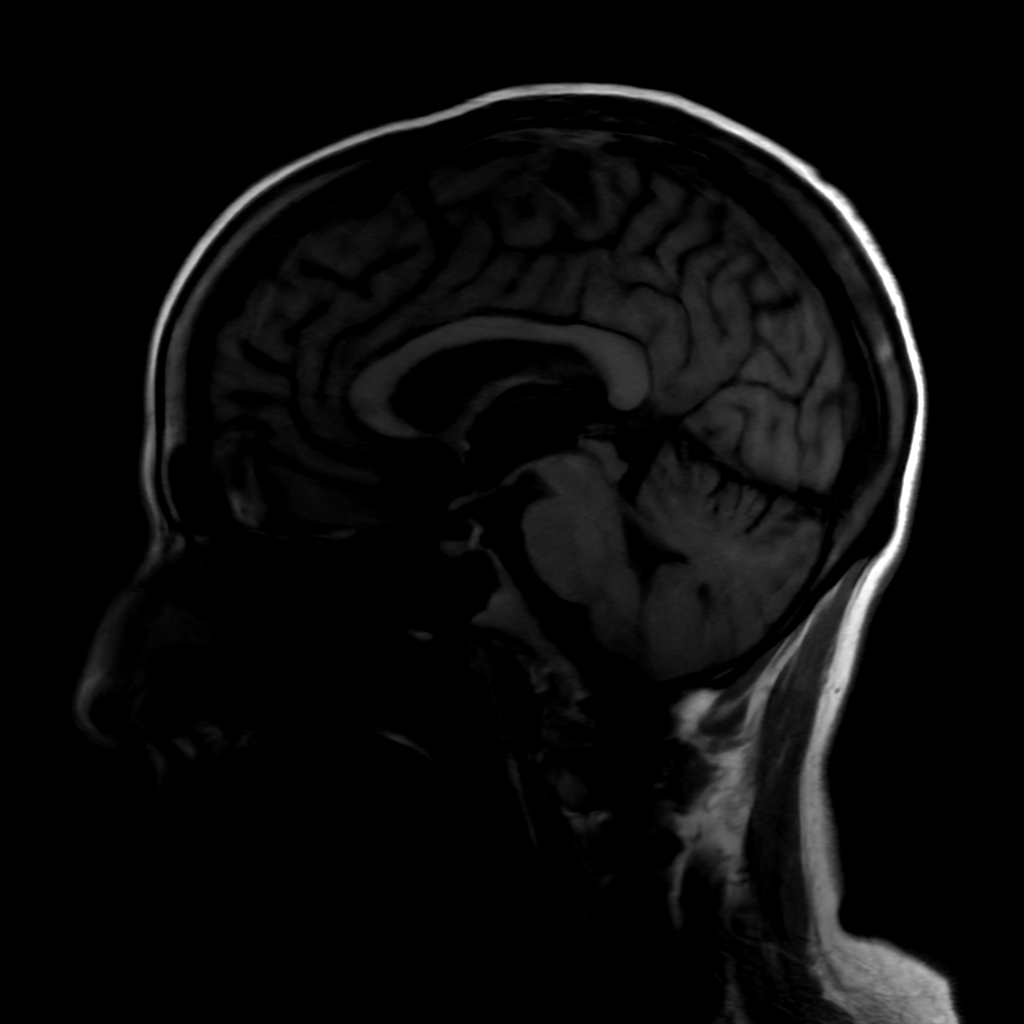
[im 22/22]
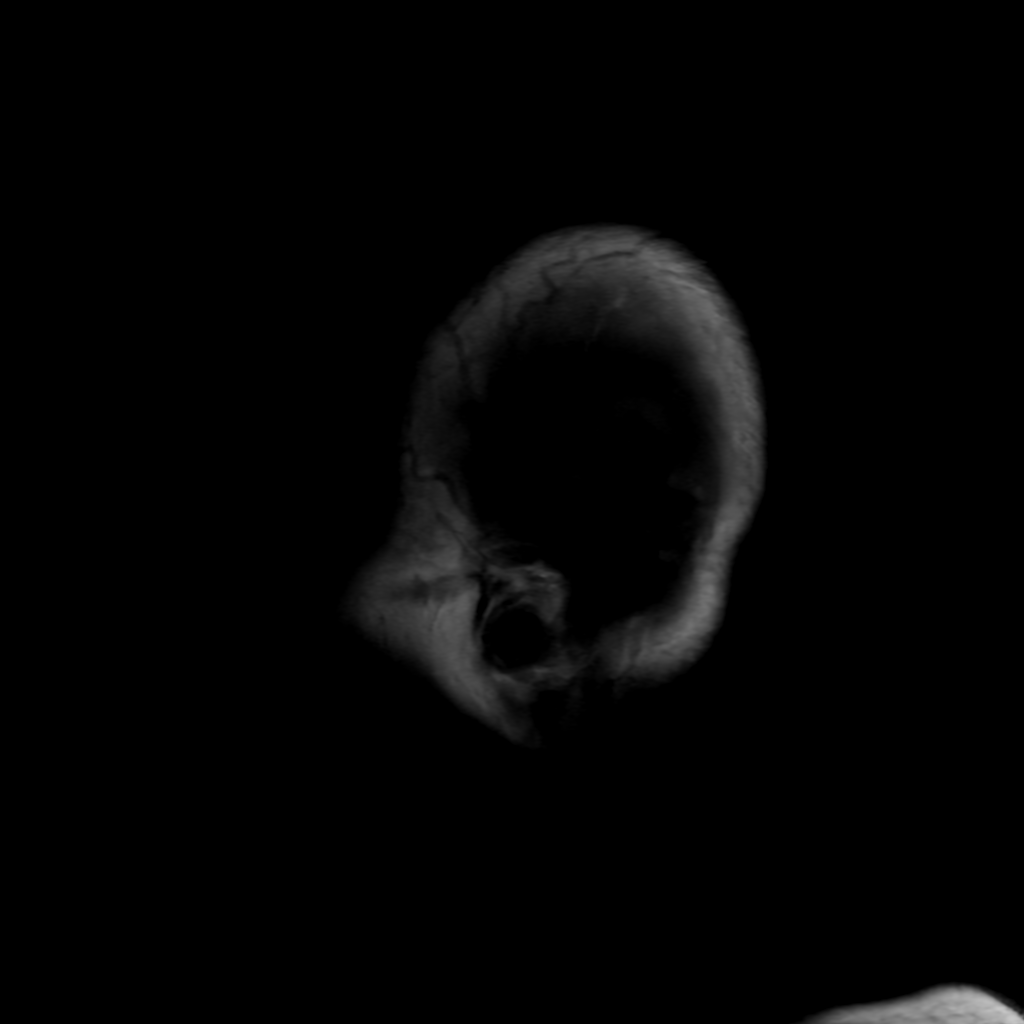

[Series 6: FLAIR · axial · 3.0mm · 0.47mm/px · z∈[-126,+22]mm · 2 of 26 slices shown (2 of 2)]
[im 1/26]
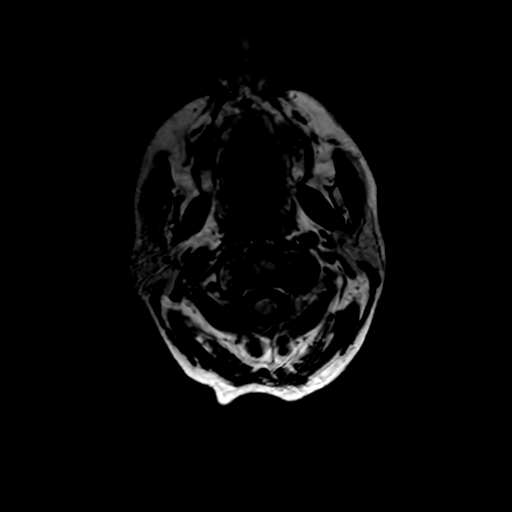
[im 26/26]
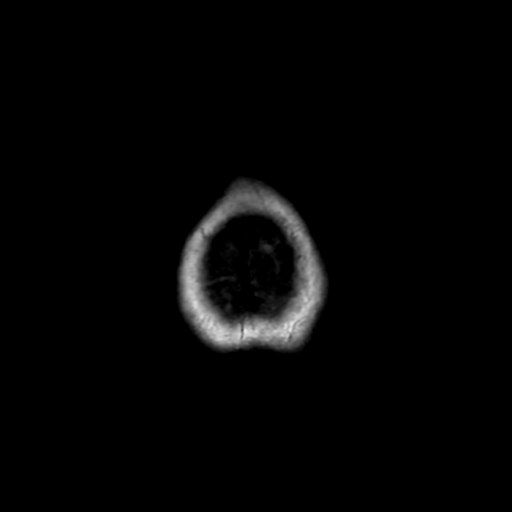

[Series 12: FLAIR post-contrast · sagittal · 5.0mm · 0.23mm/px · 1 of 22 slices shown]
[im 1/22]
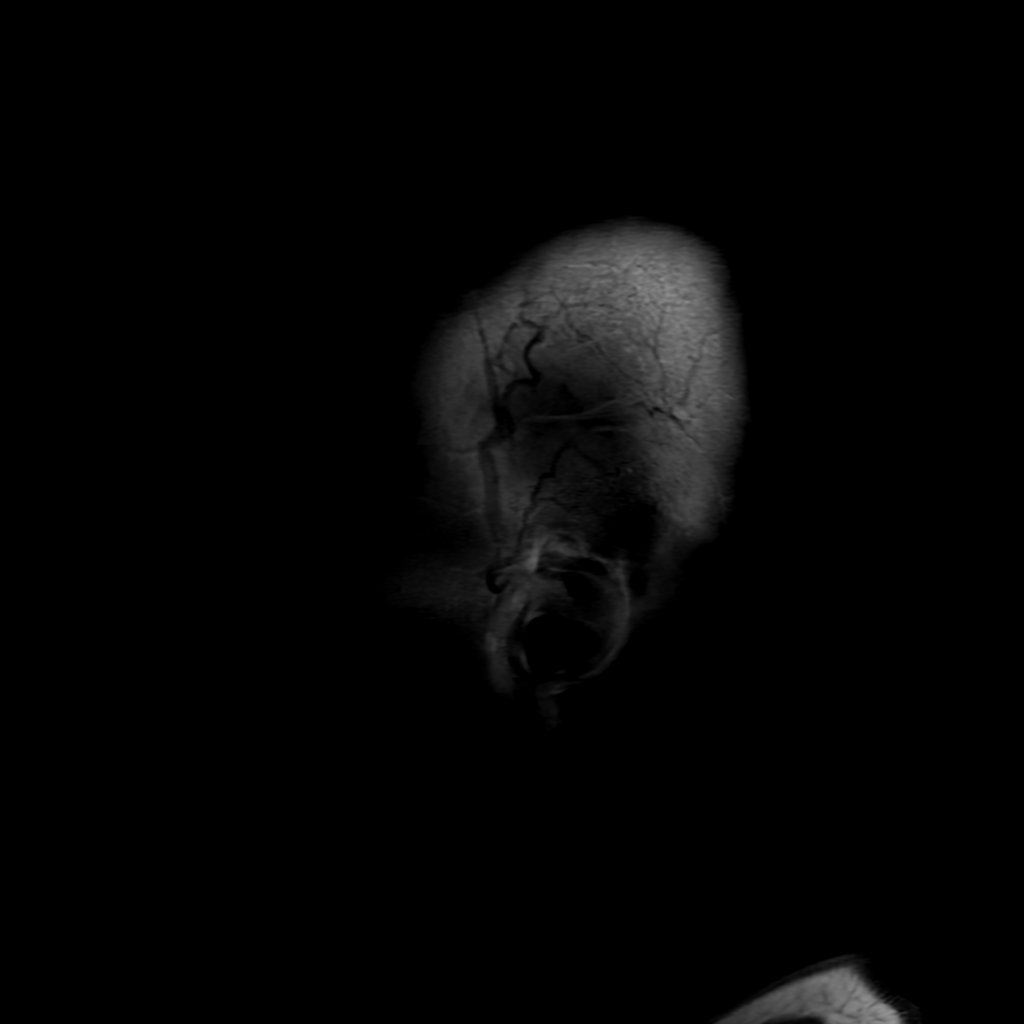

[6 of 48 positions shown; findings below may reference images not displayed]

FINDINGS: Brain: No acute infarct, acute hemorrhage or extra-axial collection.
Multiple bilateral areas of white matter hyperintense T2-weighted
signal are unchanged. Normal volume of CSF spaces. No chronic
microhemorrhage. Normal midline structures. There is no abnormal
contrast enhancement.

Vascular: Normal flow voids.

Skull and upper cervical spine: Normal marrow signal.

Sinuses/Orbits: Negative.

Other: None.
IMPRESSION: Unchanged bilateral areas of abnormal white matter signal intensity
without contrast enhancement. The etiology of the lesions remains
unclear, but vasculopathy, infection and demyelinating disease are
primary considerations.

## 2020-07-14 MED ORDER — GADOBUTROL 1 MMOL/ML IV SOLN
5.0000 mL | Freq: Once | INTRAVENOUS | Status: AC | PRN
  Administered 2020-07-14: 5 mL via INTRAVENOUS

## 2020-07-17 ENCOUNTER — Telehealth: Payer: Self-pay | Admitting: Internal Medicine

## 2020-07-17 DIAGNOSIS — R930 Abnormal findings on diagnostic imaging of skull and head, not elsewhere classified: Secondary | ICD-10-CM

## 2020-07-17 NOTE — Telephone Encounter (Signed)
PC placed to pt this a.m.  Informed pt that MRI of brain reveals that she still has lesions in the brain.  Will need to refer to neurology to eval further.  Pt expressewd understanding and agrees with the plan.

## 2020-07-18 ENCOUNTER — Other Ambulatory Visit: Payer: Self-pay

## 2020-07-18 ENCOUNTER — Encounter: Payer: Self-pay | Admitting: Neurology

## 2020-07-18 MED ORDER — CEPHALEXIN 500 MG PO CAPS
500.0000 mg | ORAL_CAPSULE | Freq: Four times a day (QID) | ORAL | 0 refills | Status: DC
Start: 2020-07-18 — End: 2020-07-27

## 2020-07-20 ENCOUNTER — Ambulatory Visit: Payer: Self-pay | Admitting: Internal Medicine

## 2020-07-21 ENCOUNTER — Other Ambulatory Visit: Payer: Self-pay

## 2020-07-22 ENCOUNTER — Other Ambulatory Visit: Payer: Self-pay

## 2020-07-24 MED ORDER — OXYCODONE HCL 10 MG PO TABS
5.0000 mg | ORAL_TABLET | Freq: Two times a day (BID) | ORAL | 0 refills | Status: DC | PRN
Start: 2020-07-24 — End: 2020-08-18

## 2020-07-24 NOTE — Telephone Encounter (Signed)
Per PMP last refill on oxycodone was 07/03/20 #60.  She has not been able to get her oxycontin due to cost. (06/08/20).

## 2020-07-26 ENCOUNTER — Telehealth: Payer: Self-pay

## 2020-07-26 NOTE — Telephone Encounter (Signed)
COVID-19 Pre-Screening Questions:07/26/20 ° °Do you currently have a fever (>100 °F), chills or unexplained body aches? NO ° °Are you currently experiencing new cough, shortness of breath, sore throat, runny nose? NO °•  °Have you been in contact with someone that is currently pending confirmation of Covid19 testing or has been confirmed to have the Covid19 virus? NO ° °**If the patient answers NO to ALL questions -  advise the patient to please call the clinic before coming to the office should any symptoms develop.  ° ° °

## 2020-07-27 ENCOUNTER — Encounter: Payer: Self-pay | Admitting: Internal Medicine

## 2020-07-27 ENCOUNTER — Telehealth (INDEPENDENT_AMBULATORY_CARE_PROVIDER_SITE_OTHER): Payer: Medicaid Other | Admitting: Internal Medicine

## 2020-07-27 ENCOUNTER — Other Ambulatory Visit: Payer: Self-pay

## 2020-07-27 DIAGNOSIS — B182 Chronic viral hepatitis C: Secondary | ICD-10-CM

## 2020-07-27 DIAGNOSIS — G062 Extradural and subdural abscess, unspecified: Secondary | ICD-10-CM | POA: Diagnosis not present

## 2020-07-27 NOTE — Progress Notes (Signed)
   Subjective:    Patient ID: Debra Schmidt, female    DOB: Jun 17, 1961, 59 y.o.   MRN: [id number]  HPI I connected with  Silver L Rufino on 07/27/20 by phone and verified that I am speaking with the correct person using two identifiers.   I discussed the limitations of evaluation and management by telemedicine. The patient expressed understanding and agreed to proceed.  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  She feels better and moving more.  No fever, no worsening pain or other concerns.   She also was found to have chronic hepatitis C and medication has been approved. She has not picked it up.      Review of Systems  Constitutional: Negative for chills and fever.  Gastrointestinal: Negative for diarrhea.  Skin: Negative for rash.       Objective:   Physical Exam Neurological:     Mental Status: She is alert.  Psychiatric:        Mood and Affect: Mood normal.           Assessment & Plan:

## 2020-07-27 NOTE — Assessment & Plan Note (Signed)
She continues to do well now off of treatment.  Resolved.

## 2020-07-27 NOTE — Assessment & Plan Note (Addendum)
Will get her the medication mailed to her and arrange follow up with the pharmacy team  20 minutes spent on the visit

## 2020-08-01 NOTE — Telephone Encounter (Signed)
Medication will be shipped to [facility] 08/03/2020.  Netty Starring. Dimas Aguas CPhT Specialty Pharmacy Patient [facility] for Infectious Disease Phone: [phone] Fax:  [phone]

## 2020-08-03 ENCOUNTER — Telehealth: Payer: Self-pay | Admitting: Pharmacy Technician

## 2020-08-03 NOTE — Telephone Encounter (Addendum)
[facility] Patient Advocate Encounter  Patient's medication Mavyret has been shipped to [facility] for pick up.  I left HIPPA compliant voicemail asking her to call.  She will need to be set up on pharmacy schedule to pick up and get counseling on how to take the medication properly.  Netty Starring. Dimas Aguas CPhT Specialty Pharmacy Patient [facility] for Infectious Disease Phone: [phone] Fax:  [phone]

## 2020-08-04 ENCOUNTER — Ambulatory Visit: Payer: Self-pay | Admitting: Family Medicine

## 2020-08-07 ENCOUNTER — Encounter: Payer: Self-pay | Admitting: Pharmacy Technician

## 2020-08-07 ENCOUNTER — Other Ambulatory Visit: Payer: Self-pay

## 2020-08-07 ENCOUNTER — Telehealth: Payer: Self-pay | Admitting: Pharmacist

## 2020-08-07 MED ORDER — MORPHINE SULFATE ER 15 MG PO TBCR
15.0000 mg | EXTENDED_RELEASE_TABLET | Freq: Two times a day (BID) | ORAL | 0 refills | Status: DC
Start: 2020-08-07 — End: 2020-09-05

## 2020-08-07 NOTE — Telephone Encounter (Signed)
Patient is approved to receive Mavyret x 8 weeks for chronic Hepatitis C infection. Counseled patient to take all three tablets of Mavyret daily with food.  Counseled patient the need to take all three tablets together and to not separate them out during the day. Encouraged patient not to miss any doses and explained how their chance of cure could go down with each dose missed. Counseled patient on what to do if dose is missed - if it is closer to the missed dose take immediately; if closer to next dose then skip dose and take the next dose at the usual time.   Counseled patient on common side effects such as headache, fatigue, and nausea and that these normally decrease with time. I reviewed patient medications and found no drug interactions. Discussed with patient that there are several drug interactions with Mavyret and instructed patient to call the clinic if she wishes to start a new medication during course of therapy. Also advised patient to call if she experiences any side effects. Patient will follow-up with me in the pharmacy clinic on 11/4.

## 2020-08-07 NOTE — Telephone Encounter (Signed)
[facility] [address] [place], [place] Called in  MS contin 15 mg BID since cannot afford Oxycontin- Good Rx got price down to ~ $25. If this doesn't work, can increase her Oxycodone to 10 mg 4x/day, if I'm not here.

## 2020-08-18 ENCOUNTER — Other Ambulatory Visit: Payer: Self-pay

## 2020-08-18 MED ORDER — OXYCODONE HCL 10 MG PO TABS: 5.0000 mg | ORAL_TABLET | Freq: Three times a day (TID) | ORAL | 0 refills | Status: DC | PRN

## 2020-08-21 NOTE — Progress Notes (Deleted)
NEUROLOGY CONSULTATION NOTE  Debra CUMBO MRN: [id number] DOB: 02-11-1961  Referring provider: Jonah Blue, MD Primary care provider: Jonah Blue, MD  Reason for consult:  Abnormal brain MRI  HISTORY OF PRESENT ILLNESS: Debra Schmidt is a 59 year old ***-handed female with *** who presents for abnormal brain MRI.   History supplemented by hospital records and referring provider's note.  She was admitted to [facility] from 04/23/2020 to 05/09/2020 for worsening back pain and spasms with bilateral lower extremity weakness and urinary incontinence.  Imaging demonstrated epidural abscess with discitis at L1-L2 and underwent L1-L2 laminectomy with evacuation of the abscess.  Other complications and conditions diagnosed included osteomyelitis of right shoulder requiring open debridement and distal clavicle repair and acute on chronic pancreatitis, and chronic hepatitis C.  During inpatient rehab, she had episodes of hallucinations and lethargy.  She had a CT head on 05/15/2020 which showed 3 cm hypodense focus within the right frontoparietal white matter, subtle ill-defined hypoattenuation within the left parietooccipital subcortical white matter and 7 mm hypodense focus within the anterior genu of the corpus callosum/paramedian subcortical anterior left frontal lobe.  Follow up MRI of brain with and without contrast showed multifocal nonenhancing T2/FLAIR hyperintense lesion involving the subcortical right greater than left parietal lobe and left frontal lobe of uncertain etiology.  She had a repeat MRI of brain with and without contrast on 07/15/2020 which was unchanged.  PAST MEDICAL HISTORY: No past medical history on file.  PAST SURGICAL HISTORY: Past Surgical History:  Procedure Laterality Date  . ACROMIO-CLAVICULAR JOINT REPAIR Right 04/25/2020   Procedure: ACROMIO-CLAVICULAR JOINT REPAIR;  Surgeon: Sheral Apley, MD;  Location: [facility] OR;  Service: Orthopedics;  Laterality:  Right;  . I & D EXTREMITY Right 04/25/2020   Procedure: Open debridement R AC joint; Distal clavicle repair;  Surgeon: Sheral Apley, MD;  Location: [facility] OR;  Service: Orthopedics;  Laterality: Right;  . LUMBAR LAMINECTOMY/DECOMPRESSION MICRODISCECTOMY N/A 04/23/2020   Procedure: Lumbar One-Two LAMINECTOMY for Epidural Abscess Evacuation;  Surgeon: Coletta Memos, MD;  Location: [facility] OR;  Service: Neurosurgery;  Laterality: N/A;  . RESECTION DISTAL CLAVICAL Right 04/25/2020   Procedure: RESECTION DISTAL CLAVICAL;  Surgeon: Sheral Apley, MD;  Location: [facility] OR;  Service: Orthopedics;  Laterality: Right;    MEDICATIONS: Current Outpatient Medications on File Prior to Visit  Medication Sig Dispense Refill  . acetaminophen (TYLENOL) 325 MG tablet Take 2 tablets (650 mg total) by mouth 3 (three) times daily.    . Baclofen 5 MG TABS Take 5 mg by mouth 3 (three) times daily. 45 tablet 5  . bethanechol (URECHOLINE) 25 MG tablet Take 1 tablet (25 mg total) by mouth 3 (three) times daily. (Patient not taking: Reported on 07/27/2020) 90 tablet 0  . calcium citrate (CALCITRATE - DOSED IN MG ELEMENTAL CALCIUM) 950 (200 Ca) MG tablet Take 1 tablet (200 mg of elemental calcium total) by mouth daily.    . cyclobenzaprine (FLEXERIL) 10 MG tablet Take 1 tablet (10 mg total) by mouth 3 (three) times daily. (Patient not taking: Reported on 07/27/2020) 90 tablet 0  . diazepam (VALIUM) 2 MG tablet Take 0.5 tablets (1 mg total) by mouth at bedtime. (Patient not taking: Reported on 07/27/2020) 15 tablet 0  . diclofenac (FLECTOR) 1.3 % PTCH Place 1 patch onto the skin 2 (two) times daily. (Patient not taking: Reported on 07/27/2020) 60 patch 1  . docusate sodium (COLACE) 100 MG capsule Take 2 capsules (200 mg total)  by mouth daily. 60 capsule 3  . enoxaparin (LOVENOX) 40 MG/0.4ML injection Inject 0.4 mLs (40 mg total) into the skin daily. (Patient not taking: Reported on 07/27/2020) 12 mL 0  . Glecaprevir-Pibrentasvir  (MAVYRET) 100-40 MG TABS Take 3 tablets by mouth daily. (Patient not taking: Reported on 07/27/2020) 84 tablet 1  . lidocaine (XYLOCAINE) 2 % jelly Apply topically as needed (Use with in and out catheter). 82 mL 0  . magnesium oxide (MAG-OX) 400 (241.3 Mg) MG tablet Take 0.5 tablets (200 mg total) by mouth 2 (two) times daily. 30 tablet 2  . morphine (MS CONTIN) 15 MG 12 hr tablet Take 1 tablet (15 mg total) by mouth every 12 (twelve) hours. To take place of Oxycontin - for chronic pain 60 tablet 0  . Multiple Vitamin (MULTIVITAMIN WITH MINERALS) TABS tablet Take 1 tablet by mouth daily. 100 tablet 1  . nutrition supplement, JUVEN, (JUVEN) PACK Take 1 packet by mouth 2 (two) times daily between meals. 60 packet 0  . oxyCODONE (OXYCONTIN) 10 mg 12 hr tablet Take 1 tablet (10 mg total) by mouth every 12 (twelve) hours. (Patient not taking: Reported on 07/27/2020) 46 tablet 0  . oxyCODONE (OXYCONTIN) 10 mg 12 hr tablet Take 1 tablet (10 mg total) by mouth every 12 (twelve) hours. (Patient not taking: Reported on 07/27/2020) 60 tablet 0  . oxyCODONE (OXYCONTIN) 10 mg 12 hr tablet Take 1 tablet (10 mg total) by mouth every 12 (twelve) hours. (Patient not taking: Reported on 07/27/2020) 60 tablet 0  . Oxycodone HCl 10 MG TABS Take 0.5-1 tablets (5-10 mg total) by mouth 3 (three) times daily as needed. 100 tablet 0  . pantoprazole (PROTONIX) 40 MG tablet Take 1 tablet (40 mg total) by mouth at bedtime. (Patient not taking: Reported on 07/27/2020) 30 tablet 3  . phenazopyridine (PYRIDIUM) 95 MG tablet Take 95 mg by mouth 3 (three) times daily as needed for pain. (Patient not taking: Reported on 07/27/2020)    . polyethylene glycol (MIRALAX / GLYCOLAX) 17 g packet Take 17 g by mouth daily as needed for mild constipation. 30 each 2  . potassium chloride SA (KLOR-CON) 20 MEQ tablet Take 2 tablets (40 mEq total) by mouth daily. 60 tablet 0   No current facility-administered medications on file prior to visit.     ALLERGIES: Allergies  Allergen Reactions  . Adhesive [Tape]   . Penicillins Hives    Tolerated Zosyn and Cefazolin 04/2020    FAMILY HISTORY: Family History  Problem Relation Age of Onset  . Cancer Mother   . Cancer Father    SOCIAL HISTORY: Social History   Socioeconomic History  . Marital status: Legally Separated    Spouse name: Not on file  . Number of children: Not on file  . Years of education: Not on file  . Highest education level: Not on file  Occupational History  . Not on file  Tobacco Use  . Smoking status: Current Every Day Smoker    Packs/day: 0.30    Types: Cigarettes  . Smokeless tobacco: Never Used  Substance and Sexual Activity  . Alcohol use: Not Currently  . Drug use: Not Currently  . Sexual activity: Not on file  Other Topics Concern  . Not on file  Social History Narrative  . Not on file   Social Determinants of Health   Financial Resource Strain:   . Difficulty of Paying Living Expenses: Not on file  Food Insecurity:   . Worried  About Running Out of Food in the Last Year: Not on file  . Ran Out of Food in the Last Year: Not on file  Transportation Needs:   . Lack of Transportation (Medical): Not on file  . Lack of Transportation (Non-Medical): Not on file  Physical Activity:   . Days of Exercise per Week: Not on file  . Minutes of Exercise per Session: Not on file  Stress:   . Feeling of Stress : Not on file  Social Connections:   . Frequency of Communication with Friends and Family: Not on file  . Frequency of Social Gatherings with Friends and Family: Not on file  . Attends Religious Services: Not on file  . Active Member of Clubs or Organizations: Not on file  . Attends Banker Meetings: Not on file  . Marital Status: Not on file  Intimate Partner Violence:   . Fear of Current or Ex-Partner: Not on file  . Emotionally Abused: Not on file  . Physically Abused: Not on file  . Sexually Abused: Not on file     PHYSICAL EXAM: *** General: No acute distress.  Patient appears well-groomed.  Head:  Normocephalic/atraumatic Eyes:  fundi examined but not visualized Neck: supple, no paraspinal tenderness, full range of motion Back: No paraspinal tenderness Heart: regular rate and rhythm Lungs: Clear to auscultation bilaterally. Vascular: No carotid bruits. Neurological Exam: Mental status: alert and oriented to person, place, and time, recent and remote memory intact, fund of knowledge intact, attention and concentration intact, speech fluent and not dysarthric, language intact. Cranial nerves: CN I: not tested CN II: pupils equal, round and reactive to light, visual fields intact CN III, IV, VI:  full range of motion, no nystagmus, no ptosis CN V: facial sensation intact CN VII: upper and lower face symmetric CN VIII: hearing intact CN IX, X: gag intact, uvula midline CN XI: sternocleidomastoid and trapezius muscles intact CN XII: tongue midline Bulk & Tone: normal, no fasciculations. Motor:  5/5 throughout *** Sensation:  Pinprick *** temperature *** and vibration sensation intact.  ***. Deep Tendon Reflexes:  2+ throughout, *** toes downgoing.  *** Finger to nose testing:  Without dysmetria.  *** Heel to shin:  Without dysmetria.  *** Gait:  Normal station and stride.  Able to turn and tandem walk. Romberg ***.  IMPRESSION: 1.  White matter abnormality of MRI of brain.  Unclear etiology.  Differential diagnosis includes vasculopathy, infection or demyelinating disease.  PLAN: 1.  Will check CTA of head and neck 2.  Will order LP for CSF analysis:  Cell count, protein, glucose, GS & culture, ***  Thank you for allowing me to take part in the care of this patient.  Shon Millet, DO  CC: ***

## 2020-08-22 ENCOUNTER — Ambulatory Visit: Payer: Self-pay | Admitting: Neurology

## 2020-08-24 ENCOUNTER — Ambulatory Visit: Payer: Self-pay | Admitting: Internal Medicine

## 2020-08-25 ENCOUNTER — Encounter: Payer: Medicaid Other | Admitting: Physical Medicine and Rehabilitation

## 2020-08-29 ENCOUNTER — Other Ambulatory Visit: Payer: Self-pay

## 2020-08-29 NOTE — Telephone Encounter (Signed)
Dr. Lovorn patient 

## 2020-09-05 ENCOUNTER — Other Ambulatory Visit: Payer: Self-pay

## 2020-09-05 MED ORDER — MORPHINE SULFATE ER 15 MG PO TBCR
15.0000 mg | EXTENDED_RELEASE_TABLET | Freq: Two times a day (BID) | ORAL | 0 refills | Status: DC
Start: 2020-09-05 — End: 2020-10-06

## 2020-09-08 ENCOUNTER — Telehealth: Payer: Self-pay

## 2020-09-08 NOTE — Progress Notes (Deleted)
NEUROLOGY CONSULTATION NOTE  Debra Schmidt MRN: [id number] DOB: 1961/03/16  Referring provider: Jonah Blue, MD Primary care provider: Jonah Blue, MD  Reason for consult:  Abnormal MRI  HISTORY OF PRESENT ILLNESS: Debra Schmidt is a 59 year old ***-handed female who presents for abnormal MRI.  History supplemented by hospital notes and referring provider's note.  She was admitted to [facility] from 04/23/2020 to 05/09/2020 for worsening back pain, lower extremity weakness and urinary incontinence.  *** showed an epidural abscess with discitis at L1-L2 and underwent L1-L2 laminectomy with evacuation of the abscess.  She was also found to have osteomyelitis of the right shoulder and underwent open debridement and distal clavicle repair.  ID was consulted and was started on 6 week course of IV cefazolin for disseminated MSSA infection.  2D echocardiogram showed EF *** with no valvular abnormality.  She also developed acute on chronic pancreatitis and diagnosed with chronic hepatitis C.  During hospitalization, she developed altered mental status with lethargy and hallucinations.  MRI of brain personally reviewed showed multiple non-enhancing hyperintense lesions involving the subcortical white matter of the right greater than left parietal lobes and left frontal lobe.  ID did not feel these represented septic emboli.  She was discharged to rehab and has undergone physical therapy.  She finished her IV antibiotics on 06/03/2020.  She has followed up with ***.  She established care with a PCP, Dr. Laural Benes, who ordered a repeat MRI of the brain with and without contrast performed on 07/14/2020 which showed no change from prior imaging with uncertain etiology but vasculopathy, infection and demyelinating disease are considered.  PAST MEDICAL HISTORY: No past medical history on file.  PAST SURGICAL HISTORY: Past Surgical History:  Procedure Laterality Date  . ACROMIO-CLAVICULAR JOINT REPAIR  Right 04/25/2020   Procedure: ACROMIO-CLAVICULAR JOINT REPAIR;  Surgeon: Sheral Apley, MD;  Location: [facility] OR;  Service: Orthopedics;  Laterality: Right;  . I & D EXTREMITY Right 04/25/2020   Procedure: Open debridement R AC joint; Distal clavicle repair;  Surgeon: Sheral Apley, MD;  Location: [facility] OR;  Service: Orthopedics;  Laterality: Right;  . LUMBAR LAMINECTOMY/DECOMPRESSION MICRODISCECTOMY N/A 04/23/2020   Procedure: Lumbar One-Two LAMINECTOMY for Epidural Abscess Evacuation;  Surgeon: Coletta Memos, MD;  Location: [facility] OR;  Service: Neurosurgery;  Laterality: N/A;  . RESECTION DISTAL CLAVICAL Right 04/25/2020   Procedure: RESECTION DISTAL CLAVICAL;  Surgeon: Sheral Apley, MD;  Location: [facility] OR;  Service: Orthopedics;  Laterality: Right;    MEDICATIONS: Current Outpatient Medications on File Prior to Visit  Medication Sig Dispense Refill  . acetaminophen (TYLENOL) 325 MG tablet Take 2 tablets (650 mg total) by mouth 3 (three) times daily.    . Baclofen 5 MG TABS Take 5 mg by mouth 3 (three) times daily. 45 tablet 5  . bethanechol (URECHOLINE) 25 MG tablet Take 1 tablet (25 mg total) by mouth 3 (three) times daily. (Patient not taking: Reported on 07/27/2020) 90 tablet 0  . calcium citrate (CALCITRATE - DOSED IN MG ELEMENTAL CALCIUM) 950 (200 Ca) MG tablet Take 1 tablet (200 mg of elemental calcium total) by mouth daily.    . cyclobenzaprine (FLEXERIL) 10 MG tablet Take 1 tablet (10 mg total) by mouth 3 (three) times daily. (Patient not taking: Reported on 07/27/2020) 90 tablet 0  . diazepam (VALIUM) 2 MG tablet Take 0.5 tablets (1 mg total) by mouth at bedtime. (Patient not taking: Reported on 07/27/2020) 15 tablet 0  . diclofenac (FLECTOR) 1.3 %  PTCH Place 1 patch onto the skin 2 (two) times daily. (Patient not taking: Reported on 07/27/2020) 60 patch 1  . docusate sodium (COLACE) 100 MG capsule Take 2 capsules (200 mg total) by mouth daily. 60 capsule 3  . enoxaparin (LOVENOX) 40  MG/0.4ML injection Inject 0.4 mLs (40 mg total) into the skin daily. (Patient not taking: Reported on 07/27/2020) 12 mL 0  . Glecaprevir-Pibrentasvir (MAVYRET) 100-40 MG TABS Take 3 tablets by mouth daily. (Patient not taking: Reported on 07/27/2020) 84 tablet 1  . lidocaine (XYLOCAINE) 2 % jelly Apply topically as needed (Use with in and out catheter). 82 mL 0  . magnesium oxide (MAG-OX) 400 (241.3 Mg) MG tablet Take 0.5 tablets (200 mg total) by mouth 2 (two) times daily. 30 tablet 2  . morphine (MS CONTIN) 15 MG 12 hr tablet Take 1 tablet (15 mg total) by mouth every 12 (twelve) hours. To take place of Oxycontin - for chronic pain 60 tablet 0  . Multiple Vitamin (MULTIVITAMIN WITH MINERALS) TABS tablet Take 1 tablet by mouth daily. 100 tablet 1  . nutrition supplement, JUVEN, (JUVEN) PACK Take 1 packet by mouth 2 (two) times daily between meals. 60 packet 0  . oxyCODONE (OXYCONTIN) 10 mg 12 hr tablet Take 1 tablet (10 mg total) by mouth every 12 (twelve) hours. (Patient not taking: Reported on 07/27/2020) 46 tablet 0  . oxyCODONE (OXYCONTIN) 10 mg 12 hr tablet Take 1 tablet (10 mg total) by mouth every 12 (twelve) hours. (Patient not taking: Reported on 07/27/2020) 60 tablet 0  . oxyCODONE (OXYCONTIN) 10 mg 12 hr tablet Take 1 tablet (10 mg total) by mouth every 12 (twelve) hours. (Patient not taking: Reported on 07/27/2020) 60 tablet 0  . Oxycodone HCl 10 MG TABS Take 0.5-1 tablets (5-10 mg total) by mouth 3 (three) times daily as needed. 100 tablet 0  . pantoprazole (PROTONIX) 40 MG tablet Take 1 tablet (40 mg total) by mouth at bedtime. (Patient not taking: Reported on 07/27/2020) 30 tablet 3  . phenazopyridine (PYRIDIUM) 95 MG tablet Take 95 mg by mouth 3 (three) times daily as needed for pain. (Patient not taking: Reported on 07/27/2020)    . polyethylene glycol (MIRALAX / GLYCOLAX) 17 g packet Take 17 g by mouth daily as needed for mild constipation. 30 each 2  . potassium chloride SA (KLOR-CON) 20  MEQ tablet Take 2 tablets (40 mEq total) by mouth daily. 60 tablet 0   No current facility-administered medications on file prior to visit.    ALLERGIES: Allergies  Allergen Reactions  . Adhesive [Tape]   . Penicillins Hives    Tolerated Zosyn and Cefazolin 04/2020    FAMILY HISTORY: Family History  Problem Relation Age of Onset  . Cancer Mother   . Cancer Father    ***.  SOCIAL HISTORY: Social History   Socioeconomic History  . Marital status: Legally Separated    Spouse name: Not on file  . Number of children: Not on file  . Years of education: Not on file  . Highest education level: Not on file  Occupational History  . Not on file  Tobacco Use  . Smoking status: Current Every Day Smoker    Packs/day: 0.30    Types: Cigarettes  . Smokeless tobacco: Never Used  Substance and Sexual Activity  . Alcohol use: Not Currently  . Drug use: Not Currently  . Sexual activity: Not on file  Other Topics Concern  . Not on file  Social  History Narrative  . Not on file   Social Determinants of Health   Financial Resource Strain:   . Difficulty of Paying Living Expenses: Not on file  Food Insecurity:   . Worried About Programme researcher, broadcasting/film/video in the Last Year: Not on file  . Ran Out of Food in the Last Year: Not on file  Transportation Needs:   . Lack of Transportation (Medical): Not on file  . Lack of Transportation (Non-Medical): Not on file  Physical Activity:   . Days of Exercise per Week: Not on file  . Minutes of Exercise per Session: Not on file  Stress:   . Feeling of Stress : Not on file  Social Connections:   . Frequency of Communication with Friends and Family: Not on file  . Frequency of Social Gatherings with Friends and Family: Not on file  . Attends Religious Services: Not on file  . Active Member of Clubs or Organizations: Not on file  . Attends Banker Meetings: Not on file  . Marital Status: Not on file  Intimate Partner Violence:   .  Fear of Current or Ex-Partner: Not on file  . Emotionally Abused: Not on file  . Physically Abused: Not on file  . Sexually Abused: Not on file    REVIEW OF SYSTEMS: Constitutional: No fevers, chills, or sweats, no generalized fatigue, change in appetite Eyes: No visual changes, double vision, eye pain Ear, nose and throat: No hearing loss, ear pain, nasal congestion, sore throat Cardiovascular: No chest pain, palpitations Respiratory:  No shortness of breath at rest or with exertion, wheezes GastrointestinaI: No nausea, vomiting, diarrhea, abdominal pain, fecal incontinence Genitourinary:  No dysuria, urinary retention or frequency Musculoskeletal:  No neck pain, back pain Integumentary: No rash, pruritus, skin lesions Neurological: as above Psychiatric: No depression, insomnia, anxiety Endocrine: No palpitations, fatigue, diaphoresis, mood swings, change in appetite, change in weight, increased thirst Hematologic/Lymphatic:  No purpura, petechiae. Allergic/Immunologic: no itchy/runny eyes, nasal congestion, recent allergic reactions, rashes  PHYSICAL EXAM: *** General: No acute distress.  Patient appears ***-groomed.  *** Head:  Normocephalic/atraumatic Eyes:  fundi examined but not visualized Neck: supple, no paraspinal tenderness, full range of motion Back: No paraspinal tenderness Heart: regular rate and rhythm Lungs: Clear to auscultation bilaterally. Vascular: No carotid bruits. Neurological Exam: Mental status: alert and oriented to person, place, and time, recent and remote memory intact, fund of knowledge intact, attention and concentration intact, speech fluent and not dysarthric, language intact. Cranial nerves: CN I: not tested CN II: pupils equal, round and reactive to light, visual fields intact CN III, IV, VI:  full range of motion, no nystagmus, no ptosis CN V: facial sensation intact CN VII: upper and lower face symmetric CN VIII: hearing intact CN IX, X:  gag intact, uvula midline CN XI: sternocleidomastoid and trapezius muscles intact CN XII: tongue midline Bulk & Tone: normal, no fasciculations. Motor:  5/5 throughout *** Sensation:  Pinprick *** temperature *** and vibration sensation intact.  ***. Deep Tendon Reflexes:  2+ throughout, *** toes downgoing.  *** Finger to nose testing:  Without dysmetria.  *** Heel to shin:  Without dysmetria.  *** Gait:  Normal station and stride.  Able to turn and tandem walk. Romberg ***.  IMPRESSION: ***  PLAN: ***  Thank you for allowing me to take part in the care of this patient.  Shon Millet, DO  CC: ***

## 2020-09-08 NOTE — Telephone Encounter (Signed)
[facility] Patient Advocate Encounter  Patient will be picking up her 2nd treatment of Mavyret on 09/08/20.  Clearance Coots , CPhT Specialty Pharmacy Patient [facility] Phone: [phone] Fax:  [phone]

## 2020-09-11 ENCOUNTER — Ambulatory Visit: Payer: Self-pay | Admitting: Neurology

## 2020-09-13 ENCOUNTER — Encounter: Payer: Medicaid Other | Attending: Registered Nurse | Admitting: Physical Medicine and Rehabilitation

## 2020-09-13 ENCOUNTER — Other Ambulatory Visit: Payer: Self-pay

## 2020-09-13 ENCOUNTER — Encounter: Payer: Self-pay | Admitting: Physical Medicine and Rehabilitation

## 2020-09-13 VITALS — BP 134/84 | HR 96 | Temp 98.2°F | Ht 64.0 in | Wt 126.6 lb

## 2020-09-13 DIAGNOSIS — M792 Neuralgia and neuritis, unspecified: Secondary | ICD-10-CM | POA: Insufficient documentation

## 2020-09-13 DIAGNOSIS — G894 Chronic pain syndrome: Secondary | ICD-10-CM | POA: Diagnosis present

## 2020-09-13 DIAGNOSIS — G8222 Paraplegia, incomplete: Secondary | ICD-10-CM | POA: Diagnosis not present

## 2020-09-13 DIAGNOSIS — Z5181 Encounter for therapeutic drug level monitoring: Secondary | ICD-10-CM | POA: Diagnosis present

## 2020-09-13 DIAGNOSIS — R7881 Bacteremia: Secondary | ICD-10-CM | POA: Diagnosis not present

## 2020-09-13 DIAGNOSIS — F17210 Nicotine dependence, cigarettes, uncomplicated: Secondary | ICD-10-CM | POA: Diagnosis present

## 2020-09-13 DIAGNOSIS — B182 Chronic viral hepatitis C: Secondary | ICD-10-CM | POA: Diagnosis not present

## 2020-09-13 DIAGNOSIS — B9561 Methicillin susceptible Staphylococcus aureus infection as the cause of diseases classified elsewhere: Secondary | ICD-10-CM | POA: Insufficient documentation

## 2020-09-13 DIAGNOSIS — Z79891 Long term (current) use of opiate analgesic: Secondary | ICD-10-CM | POA: Diagnosis present

## 2020-09-13 MED ORDER — BACLOFEN 5 MG PO TABS
5.0000 mg | ORAL_TABLET | Freq: Four times a day (QID) | ORAL | 5 refills | Status: DC
Start: 2020-09-13 — End: 2021-01-05

## 2020-09-13 MED ORDER — OXYCODONE HCL 10 MG PO TABS: 5.0000 mg | ORAL_TABLET | Freq: Three times a day (TID) | ORAL | 0 refills | Status: DC | PRN

## 2020-09-13 NOTE — Patient Instructions (Signed)
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 Might have to send to Neuro-Urology due to bladder spasms- very painful- will monitor closely and gets worse, call and will place referral.   2. Don't wear brace 4 hours/day- because can cause muscles get weak (only wears ~ 2 hours/day).   3. Continue Baclofen 5 mg 3x/day- take a full 3x/day.  If spasms or tightness gets worse before next visit, call and I can increase Baclofen dose.  Continue 3x/day regularly- however Rx says 4x/day so if NEEDS another pill/day, can take it- can take up to 10 mg extra/day if needed.    4. Refill Oxycodone 10 mg #90 (3x/day as needed)- is due 11/7- but got filled 2 days later last month, but will send in early, so doesn't have to call back for it.    5. Last refilled MS Contin 10/26- so not due.   6. F/U in 3 months- earlier if need be.

## 2020-09-13 NOTE — Addendum Note (Signed)
Addended by: Silas Sacramento T on: 09/13/2020 11:14 AM   Modules accepted: Orders

## 2020-09-13 NOTE — Progress Notes (Signed)
Subjective:    Patient ID: Debra Schmidt, female    DOB: [birth year], 60 y.o.   MRN: [id number]  HPI   Patient with history of tob dep (started smoking!!!) , incomplete paraplegia,L1-L2 discitis with osteomyelitis, intra-spinal abscess s/p laminectomyL1-L2,chronic hep C, neurogenic bladder, osteomyelitis ofrightclavicle, S/p IV ABX as of 06/03/20 Is an incomplete paraplegic due to osteomyelitis   Here for f/u.    Things going OK- gets out and walks- no falls- using walking cane in yard and around house.   Uses RW when goes out.   Bladder still doing OK.   Bowels - still gets constipated- takes at least 1x/week when GETS constipated. Takes 1 capful- when uses it.   Goes at least 1x/day- if gets hard balls, then takes another dose of miralax.    Pain meds-  Usually takes MS Contin between 7-8 am- and 2nd pill if shes takes it, takes 8-11pm at night. Sometimes don't take pm dose. Night time up and down- which is chronic.  If can get by, tries not to take oxycodone til afternoon, at least Oxycodone/day-  Always takes 10 mg oxycodone.  When tries to take 5 mg oxycodone, doesn't last very long.  Feels pain pretty well controlled right now, unless does something cannot do- like bending/stooping.    Hasn't been able to garden since still using RW and cannot bend down.    Had an appointment wouldn't see her at PCP- until December- showed up at time- but checking someone else before her, so was "late".   Takes K+, Mg,  Doesn't take Flexeril anymore.  On Baclofen 2-3x/day- helps keep her relaxed so not spasming as much.   Spasming are doing pretty good.  Still occ, pretty bad- more at night time- also gets cramps.   Sometimes will also have in bladder. Occurs ~ 1x/week or so.   Smokes 3 cigarettes/day- mainly with cup of coffee or eat.   Had COVID 3 weeks ago (husband also did- almost took him to hospital).  Everyone in family got sick.  No more Sx's.  Loss sense of  smell/taste- just coming back.   Hasn't seen surgeon lately- use for comfort/lumbar support  Off All ABX now To see Dr Luciana Axe tomorrow to see how Hep C is going.     Pain Inventory Average Pain 7 Pain Right Now 7 My pain is dull and aching  In the last 24 hours, has pain interfered with the following? General activity 3 Relation with others 0 Enjoyment of life 0 What TIME of day is your pain at its worst? morning  and evening Sleep (in general) Fair  Pain is worse with: walking, bending and some activites Pain improves with: medication Relief from Meds: 9  Family History  Problem Relation Age of Onset  . Cancer Mother   . Cancer Father    Social History   Socioeconomic History  . Marital status: Media planner    Spouse name: Not on file  . Number of children: Not on file  . Years of education: Not on file  . Highest education level: Not on file  Occupational History  . Not on file  Tobacco Use  . Smoking status: Current Every Day Smoker    Packs/day: 0.30    Types: Cigarettes  . Smokeless tobacco: Never Used  Substance and Sexual Activity  . Alcohol use: Not Currently  . Drug use: Not Currently  . Sexual activity: Not on file  Other Topics Concern  .  Not on file  Social History Narrative  . Not on file   Social Determinants of Health   Financial Resource Strain:   . Difficulty of Paying Living Expenses: Not on file  Food Insecurity:   . Worried About Programme researcher, broadcasting/film/video in the Last Year: Not on file  . Ran Out of Food in the Last Year: Not on file  Transportation Needs:   . Lack of Transportation (Medical): Not on file  . Lack of Transportation (Non-Medical): Not on file  Physical Activity:   . Days of Exercise per Week: Not on file  . Minutes of Exercise per Session: Not on file  Stress:   . Feeling of Stress : Not on file  Social Connections:   . Frequency of Communication with Friends and Family: Not on file  . Frequency of Social  Gatherings with Friends and Family: Not on file  . Attends Religious Services: Not on file  . Active Member of Clubs or Organizations: Not on file  . Attends Banker Meetings: Not on file  . Marital Status: Not on file   Past Surgical History:  Procedure Laterality Date  . ACROMIO-CLAVICULAR JOINT REPAIR Right 04/25/2020   Procedure: ACROMIO-CLAVICULAR JOINT REPAIR;  Surgeon: Sheral Apley, MD;  Location: [facility] OR;  Service: Orthopedics;  Laterality: Right;  . I & D EXTREMITY Right 04/25/2020   Procedure: Open debridement R AC joint; Distal clavicle repair;  Surgeon: Sheral Apley, MD;  Location: [facility] OR;  Service: Orthopedics;  Laterality: Right;  . LUMBAR LAMINECTOMY/DECOMPRESSION MICRODISCECTOMY N/A 04/23/2020   Procedure: Lumbar One-Two LAMINECTOMY for Epidural Abscess Evacuation;  Surgeon: Coletta Memos, MD;  Location: [facility] OR;  Service: Neurosurgery;  Laterality: N/A;  . RESECTION DISTAL CLAVICAL Right 04/25/2020   Procedure: RESECTION DISTAL CLAVICAL;  Surgeon: Sheral Apley, MD;  Location: [facility] OR;  Service: Orthopedics;  Laterality: Right;   Past Surgical History:  Procedure Laterality Date  . ACROMIO-CLAVICULAR JOINT REPAIR Right 04/25/2020   Procedure: ACROMIO-CLAVICULAR JOINT REPAIR;  Surgeon: Sheral Apley, MD;  Location: [facility] OR;  Service: Orthopedics;  Laterality: Right;  . I & D EXTREMITY Right 04/25/2020   Procedure: Open debridement R AC joint; Distal clavicle repair;  Surgeon: Sheral Apley, MD;  Location: [facility] OR;  Service: Orthopedics;  Laterality: Right;  . LUMBAR LAMINECTOMY/DECOMPRESSION MICRODISCECTOMY N/A 04/23/2020   Procedure: Lumbar One-Two LAMINECTOMY for Epidural Abscess Evacuation;  Surgeon: Coletta Memos, MD;  Location: [facility] OR;  Service: Neurosurgery;  Laterality: N/A;  . RESECTION DISTAL CLAVICAL Right 04/25/2020   Procedure: RESECTION DISTAL CLAVICAL;  Surgeon: Sheral Apley, MD;  Location: [facility] OR;  Service: Orthopedics;  Laterality: Right;    No past medical history on file. BP 134/84   Pulse 96   Temp 98.2 F (36.8 C)   Ht 5\' 4"  (1.626 m)   Wt 126 lb 9.6 oz (57.4 kg)   SpO2 96%   BMI 21.73 kg/m   Opioid Risk Score:   Fall Risk Score:  `1  Depression screen PHQ 2/9  Depression screen [facility] 2/9 07/27/2020 06/08/2020  Decreased Interest 0 0  Down, Depressed, Hopeless 1 0  PHQ - 2 Score 1 0  Altered sleeping - 0  Tired, decreased energy - 0  Change in appetite - 0  Feeling bad or failure about yourself  - 0  Trouble concentrating - 0  Moving slowly or fidgety/restless - 0  Suicidal thoughts - 0  PHQ-9 Score - 0   Review of  Systems An entire ROS was completed and found ot be negative, except for HPI.     Objective:   Physical Exam  Awake, alert, appropriate, using RW, good color today, NAD Smells of smoke-  MS: 5/5 in UEs deltoids, biceps, triceps, WE, grip and finger abd B/L LE's- HF 4-/5 on L; 4/5 on R KE 5-/5 B/L; KF 5-/5 B/L; DF 4+/5 B/L; PF 5-/5 B/L  Neuro: MAS of 1+ to 2 in LE's No clonus or hoffman's B/L      Assessment & Plan:   Patient with history of tob dep, , incomplete paraplegia,L1-L2 discitis with osteomyelitis, intra-spinal abscess s/p laminectomyL1-L2,chronic hep C, neurogenic bladder, osteomyelitis ofrightclavicle, S/p IV ABX as of 06/03/20 Is an incomplete paraplegic due to osteomyelitis   1. Might have to send to Neuro-Urology due to bladder spasms- very painful- will monitor closely and gets worse, call and will place referral.   2. Don't wear brace 4 hours/day- because can cause muscles get weak (only wears ~ 2 hours/day).   3. Continue Baclofen 5 mg 3x/day- take a full 3x/day.  If spasms or tightness gets worse before next visit, call and I can increase Baclofen dose.  Continue 3x/day regularly- however Rx says 4x/day so if NEEDS another pill/day, can take it- can take up to 10 mg extra/day if needed.    4. Refill Oxycodone 10 mg #90 (3x/day as needed)- is due 11/7-  but got filled 2 days later last month, but will send in early, so doesn't have to call back for it.    5. Last refilled MS Contin 10/26- so not due.   6. F/U in 3 months- earlier if need be.    I spent a total of 30 minutes on visit- as detailed above.

## 2020-09-14 ENCOUNTER — Ambulatory Visit (INDEPENDENT_AMBULATORY_CARE_PROVIDER_SITE_OTHER): Payer: Medicaid Other | Admitting: Pharmacist

## 2020-09-14 DIAGNOSIS — B182 Chronic viral hepatitis C: Secondary | ICD-10-CM

## 2020-09-14 NOTE — Progress Notes (Signed)
HPI: Debra Schmidt is a 59 y.o. female who presents to the [facility] pharmacy clinic for Hepatitis C follow-up.  Medication: Mavyret  Start Date: 08/10/20  Hepatitis C Genotype: 1a  Fibrosis Score: F0-F1  Hepatitis C RNA: 207,000  Patient Active Problem List   Diagnosis Date Noted  . Chronic pain syndrome 09/13/2020  . Multiple lesions on CT of brain and spine 06/23/2020  . Chronic pancreatitis (HCC) 06/23/2020  . Epidural abscess 06/22/2020  . Muscle spasms of both lower extremities 06/06/2020  . Abnormal finding on MRI of brain 06/06/2020  . Chronic hepatitis C without hepatic coma (HCC) 06/06/2020  . Labile blood pressure   . Neuropathic pain   . History of hypertension   . Incomplete paraplegia (HCC) 05/09/2020  . Neurogenic bladder 05/09/2020  . Neurogenic bowel 05/09/2020  . MSSA bacteremia 04/25/2020  . Septic arthritis of right acromioclavicular joint (HCC) 04/24/2020  . Osteomyelitis of clavicle (HCC) 04/24/2020  . Cigarette smoker 04/24/2020  . Status post surgery 04/23/2020    Patient's Medications  New Prescriptions   No medications on file  Previous Medications   ACETAMINOPHEN (TYLENOL) 325 MG TABLET    Take 2 tablets (650 mg total) by mouth 3 (three) times daily.   BACLOFEN 5 MG TABS    Take 5 mg by mouth 4 (four) times daily. For spasticity   CALCIUM CITRATE (CALCITRATE - DOSED IN MG ELEMENTAL CALCIUM) 950 (200 CA) MG TABLET    Take 1 tablet (200 mg of elemental calcium total) by mouth daily.   DICLOFENAC (FLECTOR) 1.3 % PTCH    Place 1 patch onto the skin 2 (two) times daily.   DOCUSATE SODIUM (COLACE) 100 MG CAPSULE    Take 2 capsules (200 mg total) by mouth daily.   GLECAPREVIR-PIBRENTASVIR (MAVYRET) 100-40 MG TABS    Take 3 tablets by mouth daily.   LIDOCAINE (XYLOCAINE) 2 % JELLY    Apply topically as needed (Use with in and out catheter).   MAGNESIUM OXIDE (MAG-OX) 400 (241.3 MG) MG TABLET    Take 0.5 tablets (200 mg total) by mouth 2 (two) times  daily.   MORPHINE (MS CONTIN) 15 MG 12 HR TABLET    Take 1 tablet (15 mg total) by mouth every 12 (twelve) hours. To take place of Oxycontin - for chronic pain   MULTIPLE VITAMIN (MULTIVITAMIN WITH MINERALS) TABS TABLET    Take 1 tablet by mouth daily.   OXYCODONE HCL 10 MG TABS    Take 0.5-1 tablets (5-10 mg total) by mouth 3 (three) times daily as needed.   PANTOPRAZOLE (PROTONIX) 40 MG TABLET    Take 1 tablet (40 mg total) by mouth at bedtime.   POLYETHYLENE GLYCOL (MIRALAX / GLYCOLAX) 17 G PACKET    Take 17 g by mouth daily as needed for mild constipation.   POTASSIUM CHLORIDE SA (KLOR-CON) 20 MEQ TABLET    Take 2 tablets (40 mEq total) by mouth daily.  Modified Medications   No medications on file  Discontinued Medications   No medications on file    Allergies: Allergies  Allergen Reactions  . Adhesive [Tape]   . Penicillins Hives    Tolerated Zosyn and Cefazolin 04/2020    Past Medical History: No past medical history on file.  Social History: Social History   Socioeconomic History  . Marital status: Media planner    Spouse name: Not on file  . Number of children: Not on file  . Years of education: Not on  file  . Highest education level: Not on file  Occupational History  . Not on file  Tobacco Use  . Smoking status: Current Every Day Smoker    Packs/day: 0.30    Types: Cigarettes  . Smokeless tobacco: Never Used  Substance and Sexual Activity  . Alcohol use: Not Currently  . Drug use: Not Currently  . Sexual activity: Not on file  Other Topics Concern  . Not on file  Social History Narrative  . Not on file   Social Determinants of Health   Financial Resource Strain:   . Difficulty of Paying Living Expenses: Not on file  Food Insecurity:   . Worried About Programme researcher, broadcasting/film/video in the Last Year: Not on file  . Ran Out of Food in the Last Year: Not on file  Transportation Needs:   . Lack of Transportation (Medical): Not on file  . Lack of Transportation  (Non-Medical): Not on file  Physical Activity:   . Days of Exercise per Week: Not on file  . Minutes of Exercise per Session: Not on file  Stress:   . Feeling of Stress : Not on file  Social Connections:   . Frequency of Communication with Friends and Family: Not on file  . Frequency of Social Gatherings with Friends and Family: Not on file  . Attends Religious Services: Not on file  . Active Member of Clubs or Organizations: Not on file  . Attends Banker Meetings: Not on file  . Marital Status: Not on file    Labs: Hepatitis C Lab Results  Component Value Date   HCVGENOTYPE 1a 06/22/2020   FIBROSTAGE F0-F1 06/22/2020   Hepatitis B Lab Results  Component Value Date   HEPBSAB REACTIVE (A) 06/22/2020   HEPBSAG NON-REACTIVE 06/22/2020   HEPBCAB REACTIVE (A) 06/22/2020   Hepatitis A Lab Results  Component Value Date   HAV NON-REACTIVE 06/22/2020   HIV Lab Results  Component Value Date   HIV NON-REACTIVE 06/22/2020   HIV Non Reactive 05/17/2020   Lab Results  Component Value Date   CREATININE 0.71 06/22/2020   CREATININE 0.59 06/02/2020   CREATININE 0.57 05/31/2020   CREATININE 0.58 05/29/2020   CREATININE 0.52 05/26/2020   Lab Results  Component Value Date   AST 27 06/22/2020   AST 20 05/10/2020   AST 32 05/04/2020   ALT 20 06/22/2020   ALT 20 06/22/2020   ALT 6 05/10/2020    Assessment: Debra Schmidt presents to clinic today for her 36-month hepatitis C follow-up. She started Mavyret on 9/30 and already picked up her second month last week. She has ~3 weeks of treatment left. She stated she experienced moderate fatigue when first starting Mavyret that faded after a couple weeks. She has not missed any doses and always takes her Mavyret with her breakfast.   Will check HCV RNA today and schedule lab follow-up in ~1 month for end of treatment HCV RNA. Will schedule ~65-month SVR appointment with Dr. Luciana Axe.    Plan: Check HCV RNA Lab appointment 10/17/20  @ 3:45PM Follow-up with [facility] on 01/22/21 @ 4:15PM  Margarite Gouge, PharmD PGY2 ID Pharmacy Resident [phone]  09/14/2020, 3:16 PM

## 2020-09-16 LAB — HEPATITIS C RNA QUANTITATIVE
HCV RNA, PCR, QN (Log): <1.18 log IU/mL
HCV RNA, PCR, QN: <15 IU/mL

## 2020-09-17 LAB — DRUG TOX MONITOR 1 W/CONF, ORAL FLD
Amphetamines: NEGATIVE ng/mL (ref <10)
Barbiturates: NEGATIVE ng/mL (ref <10)
Benzodiazepines: NEGATIVE ng/mL (ref <0.50)
Buprenorphine: NEGATIVE ng/mL (ref <0.10)
Cocaine: NEGATIVE ng/mL (ref <5.0)
Codeine: NEGATIVE ng/mL (ref <2.5)
Cotinine: >250 ng/mL — ABNORMAL HIGH (ref <5.0)
Dihydrocodeine: NEGATIVE ng/mL (ref <2.5)
Fentanyl: >25 ng/mL — ABNORMAL HIGH (ref <0.10)
Fentanyl: POSITIVE ng/mL — AB (ref <0.10)
Heroin Metabolite: NEGATIVE ng/mL (ref <1.0)
Hydrocodone: NEGATIVE ng/mL (ref <2.5)
Hydromorphone: NEGATIVE ng/mL (ref <2.5)
MARIJUANA: NEGATIVE ng/mL (ref <2.5)
MDMA: NEGATIVE ng/mL (ref <10)
Meprobamate: NEGATIVE ng/mL (ref <2.5)
Methadone: NEGATIVE ng/mL (ref <5.0)
Morphine: 43 ng/mL — ABNORMAL HIGH (ref <2.5)
Nicotine Metabolite: POSITIVE ng/mL — AB (ref <5.0)
Norhydrocodone: NEGATIVE ng/mL (ref <2.5)
Noroxycodone: NEGATIVE ng/mL (ref <2.5)
Opiates: POSITIVE ng/mL — AB (ref <2.5)
Oxycodone: NEGATIVE ng/mL (ref <2.5)
Oxymorphone: NEGATIVE ng/mL (ref <2.5)
Phencyclidine: NEGATIVE ng/mL (ref <10)
Tapentadol: NEGATIVE ng/mL (ref <5.0)
Tramadol: NEGATIVE ng/mL (ref <5.0)
Zolpidem: NEGATIVE ng/mL (ref <5.0)

## 2020-09-17 LAB — DRUG TOX ALC METAB W/CON, ORAL FLD: Alcohol Metabolite: NEGATIVE ng/mL (ref <25)

## 2020-09-22 ENCOUNTER — Telehealth: Payer: Self-pay | Admitting: *Deleted

## 2020-09-22 DIAGNOSIS — Z79891 Long term (current) use of opiate analgesic: Secondary | ICD-10-CM

## 2020-09-22 DIAGNOSIS — G894 Chronic pain syndrome: Secondary | ICD-10-CM

## 2020-09-22 DIAGNOSIS — Z5181 Encounter for therapeutic drug level monitoring: Secondary | ICD-10-CM

## 2020-09-22 NOTE — Telephone Encounter (Signed)
Oral swab drug screen is positive for Morphine which is prescribed, but is also positive for Fentanyl which is not a prescribed medication per PMP.

## 2020-10-03 NOTE — Telephone Encounter (Signed)
Pt swears she didn't use anything-  Is willing to come do another drug test- anytime, even right this minute, she said- is being treated for Hep C. .  Which makes me wonder why she's (+)- she has been very honest about drug hx in very remote past, If I remember correctly, but not lately. Not due to come back until March, so will need another drug screen before then- Debra Schmidt, tell me when/how to order another screen- will need to be urine.

## 2020-10-06 ENCOUNTER — Other Ambulatory Visit: Payer: Self-pay

## 2020-10-09 MED ORDER — MORPHINE SULFATE ER 15 MG PO TBCR: 15.0000 mg | EXTENDED_RELEASE_TABLET | Freq: Two times a day (BID) | ORAL | 0 refills | Status: DC

## 2020-10-16 NOTE — Telephone Encounter (Addendum)
I have spoken with Silvina today and she will come in for the urine test.  She has 24 hours to comply from time of request.  She says she will come in tomorrow morning by 8 am.  I will have the lab slip up at the front desk for her to take to the lab.

## 2020-10-16 NOTE — Addendum Note (Signed)
Addended by: Doreene Eland on: 10/16/2020 10:19 AM   Modules accepted: Orders

## 2020-10-17 ENCOUNTER — Other Ambulatory Visit: Payer: Self-pay

## 2020-10-17 DIAGNOSIS — G894 Chronic pain syndrome: Secondary | ICD-10-CM | POA: Diagnosis not present

## 2020-10-17 DIAGNOSIS — Z79891 Long term (current) use of opiate analgesic: Secondary | ICD-10-CM | POA: Diagnosis not present

## 2020-10-17 DIAGNOSIS — Z5181 Encounter for therapeutic drug level monitoring: Secondary | ICD-10-CM | POA: Diagnosis not present

## 2020-10-17 DIAGNOSIS — B182 Chronic viral hepatitis C: Secondary | ICD-10-CM

## 2020-10-18 ENCOUNTER — Other Ambulatory Visit: Payer: Self-pay

## 2020-10-18 MED ORDER — OXYCODONE HCL 10 MG PO TABS: 5.0000 mg | ORAL_TABLET | Freq: Three times a day (TID) | ORAL | 0 refills | Status: DC | PRN

## 2020-10-18 NOTE — Telephone Encounter (Signed)
Refill request for Oxycodone.  

## 2020-10-19 LAB — HEPATITIS C RNA QUANTITATIVE
HCV Quantitative Log: <1.18 log IU/mL
HCV RNA, PCR, QN: <15 IU/mL

## 2020-10-20 ENCOUNTER — Ambulatory Visit: Payer: Self-pay | Admitting: Family Medicine

## 2020-10-24 LAB — TOXASSURE SELECT,+ANTIDEPR,UR

## 2020-10-26 ENCOUNTER — Telehealth: Payer: Self-pay | Admitting: *Deleted

## 2020-10-26 NOTE — Telephone Encounter (Signed)
Urine drug screen is once again positive for both prescribed morphine sulfate and unprescribed fentanyl and fentanyl metabolites.

## 2020-11-07 ENCOUNTER — Other Ambulatory Visit: Payer: Self-pay

## 2020-11-07 NOTE — Telephone Encounter (Addendum)
I have spoken with Debra Schmidt about the second drug screen test being positive for fentanyl.  She still denies taking anything but what is prescribed. This is two separate formats for testing, two separate labs- one swab and one urine. Both are positive for Fentanyl and metabolites of fentanyl. Both were negative for her oxycodone.   The urine is the gold standard for testing and the levels of fentanyl and metabolite norfentanyl are high.

## 2020-11-28 ENCOUNTER — Encounter: Payer: Self-pay | Admitting: Nurse Practitioner

## 2020-11-28 ENCOUNTER — Other Ambulatory Visit: Payer: Self-pay

## 2020-11-28 ENCOUNTER — Ambulatory Visit: Payer: Medicaid Other | Attending: Family Medicine | Admitting: Nurse Practitioner

## 2020-11-28 NOTE — Progress Notes (Signed)
Patient requesting pain medication refill of oxy and morphine. I do not provide these medications and she will need to follow up with PMR for this. She verbalized understanding and visit was not needed today

## 2020-11-28 NOTE — Progress Notes (Signed)
Please disregard.  Patient does not need the appt.

## 2020-11-30 ENCOUNTER — Telehealth: Payer: Self-pay | Admitting: Internal Medicine

## 2020-11-30 NOTE — Telephone Encounter (Signed)
Pt reports getting her covid vaccine at her local [facility].  Pt will bring vaccine card at next dr visit.

## 2020-12-15 ENCOUNTER — Encounter: Payer: Medicaid Other | Admitting: Physical Medicine and Rehabilitation

## 2020-12-19 ENCOUNTER — Ambulatory Visit: Payer: Medicaid Other | Admitting: Internal Medicine

## 2021-01-05 ENCOUNTER — Encounter: Payer: Medicaid Other | Attending: Registered Nurse | Admitting: Physical Medicine and Rehabilitation

## 2021-01-05 ENCOUNTER — Other Ambulatory Visit: Payer: Self-pay

## 2021-01-05 ENCOUNTER — Encounter: Payer: Self-pay | Admitting: Physical Medicine and Rehabilitation

## 2021-01-05 VITALS — BP 161/92 | HR 115 | Temp 98.9°F | Ht 64.0 in | Wt 130.0 lb

## 2021-01-05 DIAGNOSIS — G8222 Paraplegia, incomplete: Secondary | ICD-10-CM | POA: Diagnosis not present

## 2021-01-05 DIAGNOSIS — N319 Neuromuscular dysfunction of bladder, unspecified: Secondary | ICD-10-CM | POA: Insufficient documentation

## 2021-01-05 DIAGNOSIS — Z79891 Long term (current) use of opiate analgesic: Secondary | ICD-10-CM | POA: Diagnosis not present

## 2021-01-05 DIAGNOSIS — Z5181 Encounter for therapeutic drug level monitoring: Secondary | ICD-10-CM | POA: Diagnosis not present

## 2021-01-05 DIAGNOSIS — G894 Chronic pain syndrome: Secondary | ICD-10-CM | POA: Insufficient documentation

## 2021-01-05 MED ORDER — LIDOCAINE 5 % EX PTCH: 3.0000 | MEDICATED_PATCH | CUTANEOUS | 5 refills | Status: DC

## 2021-01-05 MED ORDER — BACLOFEN 10 MG PO TABS: 10.0000 mg | ORAL_TABLET | Freq: Four times a day (QID) | ORAL | 5 refills | Status: DC

## 2021-01-05 NOTE — Progress Notes (Signed)
Subjective:    Patient ID: Debra Schmidt, female    DOB: 1961-04-24, 60 y.o.   MRN: [id number]  HPI Patient  Is a 60 yr old female with history of tob dep,, incomplete paraplegia,L1-L2 discitis with osteomyelitis, intra-spinal abscess s/p laminectomyL1-L2,chronic hep C, neurogenic bladder, osteomyelitis ofrightclavicle, S/p IV ABX as of 06/03/20 Is an incomplete paraplegic due to osteomyelitis  Has failed UDS x2- with fentanyl in the drug screen.  Not allowed to prescribe pain meds anymore, per clinic and hospital policy.   Ran out of patches- and took Mother in law's  Fentanyl patch.   Spasticity- spasms still occurs If works self too much or gets too tired, spasms occur.  A couple of times per week.  Usually takes Baclofen 5 mg 3-4x times per day.  Stiff and tight in the mornings- mainly the back is stiff in AM. To straighten up completley.  Sleeps on back-  Last Baclofen dose 1.5 hours ago.   Goes into extensor tone when first gets up.   Walks outside - can't stand up completely when walks- and feet will hurt. Because of back.   Finally got Medicaid; hasn't gotten disability so far. Not yet.  Went and saw "their doctor".   No falls- but had 1 near falls- slipped getting out of bed.    Bladder- sometimes needs to pee and won't make it to bathroom -sometimes will wet self overnight- when wakes up, is wet, not completely soaked, but is really wet- has ot change bedding.       Pain Inventory Average Pain 7 Pain Right Now 8 My pain is aching  LOCATION OF PAIN  Back, buttocks  BOWEL Number of stools per week: 3-5 Oral laxative use Yes  Type of laxative colace, miralax Enema or suppository use No  History of colostomy No  Incontinent No   BLADDER Pads In and out cath, frequency  Able to self cath n/a Bladder incontinence No  Frequent urination Yes  Leakage with coughing Yes  Difficulty starting stream No  Incomplete bladder emptying No     Mobility walk without assistance walk with assistance use a cane use a walker how many minutes can you walk? 5-15  Function not employed: date last employed . I need assistance with the following:  household duties  Neuro/Psych bladder control problems trouble walking spasms  Prior Studies Any changes since last visit?  no  Physicians involved in your care Any changes since last visit?  no   Family History  Problem Relation Age of Onset  . Cancer Mother   . Cancer Father    Social History   Socioeconomic History  . Marital status: Media planner    Spouse name: Not on file  . Number of children: Not on file  . Years of education: Not on file  . Highest education level: Not on file  Occupational History  . Not on file  Tobacco Use  . Smoking status: Current Every Day Smoker    Packs/day: 0.30    Types: Cigarettes  . Smokeless tobacco: Never Used  Substance and Sexual Activity  . Alcohol use: Not Currently  . Drug use: Not Currently  . Sexual activity: Not on file  Other Topics Concern  . Not on file  Social History Narrative  . Not on file   Social Determinants of Health   Financial Resource Strain: Not on file  Food Insecurity: Not on file  Transportation Needs: Not on file  Physical Activity: Not  on file  Stress: Not on file  Social Connections: Not on file   Past Surgical History:  Procedure Laterality Date  . ACROMIO-CLAVICULAR JOINT REPAIR Right 04/25/2020   Procedure: ACROMIO-CLAVICULAR JOINT REPAIR;  Surgeon: Sheral Apley, MD;  Location[facility] OR;  Service: Orthopedics;  Laterality: Right;  . I & D EXTREMITY Right 04/25/2020   Procedure: Open debridement R AC joint; Distal clavicle repair;  Surgeon: Sheral Apley, MD;  Location: [facility] OR;  Service: Orthopedics;  Laterality: Right;  . LUMBAR LAMINECTOMY/DECOMPRESSION MICRODISCECTOMY N/A 04/23/2020   Procedure: Lumbar One-Two LAMINECTOMY for Epidural Abscess Evacuation;  Surgeon:  Coletta Memos, MD;  Location: [facility] OR;  Service: Neurosurgery;  Laterality: N/A;  . RESECTION DISTAL CLAVICAL Right 04/25/2020   Procedure: RESECTION DISTAL CLAVICAL;  Surgeon: Sheral Apley, MD;  Location: [facility] OR;  Service: Orthopedics;  Laterality: Right;   No past medical history on file. BP (!) 161/92   Pulse (!) 115   Temp 98.9 F (37.2 C)   Ht 5\' 4"  (1.626 m)   Wt 130 lb (59 kg)   SpO2 93%   BMI 22.31 kg/m   Opioid Risk Score:   Fall Risk Score:  `1  Depression screen PHQ 2/9  Depression screen [facility] 2/9 07/27/2020 06/08/2020  Decreased Interest 0 0  Down, Depressed, Hopeless 1 0  PHQ - 2 Score 1 0  Altered sleeping - 0  Tired, decreased energy - 0  Change in appetite - 0  Feeling bad or failure about yourself  - 0  Trouble concentrating - 0  Moving slowly or fidgety/restless - 0  Suicidal thoughts - 0  PHQ-9 Score - 0    Review of Systems  Constitutional: Negative.   HENT: Negative.   Eyes: Negative.   Respiratory: Negative.   Cardiovascular: Negative.   Gastrointestinal: Negative.   Endocrine: Negative.   Genitourinary: Negative.   Musculoskeletal: Positive for back pain and gait problem.  Skin: Negative.   Allergic/Immunologic: Negative.   Hematological: Negative.   Psychiatric/Behavioral: Negative.   All other systems reviewed and are negative.      Objective:   Physical Exam  Awake, alert, appropriate, using RW, NAD Wearing reading eyeglasses.  Neuro: MAS of 1+ in LEs- MAS of 1-  In UEs- no hoffman's B/L- 5 beats clonus in LEs B/L Sensation is patchy- from L2 B/L normal and decreased in patchy- mainly L>R  MS: UEs: Deltoids 4+/5, Biceps 4/5 B/L, triceps 4-/5 B/L, WE 4+/5 B/L, grip 4/5 B/L, finger abd 4-/5 B/L  LE's:  HF 4-/5, KE 4/5, DF 4-/5, and PF 3+/5 B/L    Assessment & Plan:   Patient  Is a 60 yr old female with history of tob dep,, incomplete paraplegia,L1-L2 discitis with osteomyelitis, intra-spinal abscess s/p  laminectomyL1-L2,chronic hep C, neurogenic bladder, osteomyelitis ofrightclavicle, S/p IV ABX as of 06/03/20 Is an incomplete paraplegic due to osteomyelitis  1. Has failed UDS x2- with fentanyl in the drug screen.  Not allowed to prescribe pain meds anymore, per clinic and hospital policy.  Happy to continue to care for pt, but not for pain.   2. 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   3. Never saw paperwork from disability that I can remember- if they want to send some to me, that's fine.   4. Referral to Dr 06/05/20- Neuro-Urology for neurogenic bladder and bladder incontinence.   5. Still using Lidoderm patch- but works best 5% patch-  wear 12 hrs on;12 hrs off.  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 Please allow pt to receive these patches to keep pain under better control.   6. F/U in 2 months to f/u on spasticity-   I spent a total of 30 minutes on visit- as detailed above.

## 2021-01-05 NOTE — Patient Instructions (Signed)
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 Has failed UDS x2- with fentanyl in the drug screen.  Not allowed to prescribe pain meds anymore, per clinic and hospital policy.  Happy to continue to care for pt, but not for pain.   2. 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   3. Never saw paperwork from disability that I can remember- if they want to send some to me, that's fine.   4. Referral to Dr Sherron Monday- Neuro-Urology for neurogenic bladder and bladder incontinence.   5. Still using Lidoderm patch- but works best 5% patch- wear 12 hrs on;12 hrs off.  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 Please allow pt to receive these patches to keep pain under better control.   6. F/U in 2 months to f/u on spasticity-

## 2021-01-22 ENCOUNTER — Ambulatory Visit: Payer: Self-pay | Admitting: Internal Medicine

## 2021-02-06 ENCOUNTER — Other Ambulatory Visit: Payer: Self-pay | Admitting: Physical Medicine and Rehabilitation

## 2021-02-07 ENCOUNTER — Telehealth: Payer: Self-pay

## 2021-02-07 NOTE — Telephone Encounter (Signed)
PA for Lidocaine Patches denied. Informed Dr. Berline Chough waiting on response whether to appeal or not.

## 2021-02-12 ENCOUNTER — Ambulatory Visit: Payer: Self-pay | Admitting: Urology

## 2021-02-20 ENCOUNTER — Other Ambulatory Visit: Payer: Self-pay

## 2021-02-20 ENCOUNTER — Encounter: Payer: Self-pay | Admitting: Internal Medicine

## 2021-02-20 ENCOUNTER — Ambulatory Visit (INDEPENDENT_AMBULATORY_CARE_PROVIDER_SITE_OTHER): Payer: Medicaid Other | Admitting: Internal Medicine

## 2021-02-20 VITALS — BP 111/76 | HR 98 | Resp 16 | Ht 64.0 in | Wt 129.0 lb

## 2021-02-20 DIAGNOSIS — G062 Extradural and subdural abscess, unspecified: Secondary | ICD-10-CM

## 2021-02-20 DIAGNOSIS — F17210 Nicotine dependence, cigarettes, uncomplicated: Secondary | ICD-10-CM

## 2021-02-20 DIAGNOSIS — B182 Chronic viral hepatitis C: Secondary | ICD-10-CM

## 2021-02-20 NOTE — Assessment & Plan Note (Signed)
She continues to have some pain but overall approved and no new concerns.

## 2021-02-20 NOTE — Progress Notes (Signed)
   Subjective:    Patient ID: Debra Schmidt, female    DOB: March 08, 1961, 60 y.o.   MRN: [id number]  HPI Here for follow up of chronic hepatitis C I saw her as a patient last year with MSSA bacteremia, infection of her shoulder, thoracic and lumbar spine and she completed a prolonged course of antibiotics.  She was also found to be hepatitis C positive and now has completed treatment for genotype 1a infection for 8 weeks and here for SVR12.  No new complaints.  Feels well.  HCV RNA negative during and after treatment.     Review of Systems  Constitutional: Negative for chills, fatigue and fever.  Gastrointestinal: Negative for diarrhea and nausea.  Skin: Negative for rash.       Objective:   Physical Exam Constitutional:      Appearance: Normal appearance.  Eyes:     General: No scleral icterus. Pulmonary:     Effort: Pulmonary effort is normal.  Neurological:     Mental Status: She is alert.  Psychiatric:        Mood and Affect: Mood normal.   SH: + tobacco        Assessment & Plan:

## 2021-02-20 NOTE — Assessment & Plan Note (Signed)
Encouraged cessation.

## 2021-02-20 NOTE — Assessment & Plan Note (Addendum)
She has now completed treatment and no issues.  Will check her SVR today and if negative will be considered cured.  Return as needed otherwise.  No significant fibrosis of liver so HCC screening not indicated

## 2021-02-23 LAB — COMPLETE METABOLIC PANEL WITH GFR
AG Ratio: 1.2 (calc) (ref 1.0–2.5)
ALT: 7 U/L (ref 6–29)
AST: 13 U/L (ref 10–35)
Albumin: 4 g/dL (ref 3.6–5.1)
Alkaline phosphatase (APISO): 118 U/L (ref 37–153)
BUN: 7 mg/dL (ref 7–25)
CO2: 28 mmol/L (ref 20–32)
Calcium: 9.5 mg/dL (ref 8.6–10.4)
Chloride: 99 mmol/L (ref 98–110)
Creat: 0.7 mg/dL (ref 0.50–0.99)
GFR, Est African American: 109 mL/min/{1.73_m2} (ref >=60)
GFR, Est Non African American: 94 mL/min/{1.73_m2} (ref >=60)
Globulin: 3.3 g/dL (calc) (ref 1.9–3.7)
Glucose, Bld: 207 mg/dL — ABNORMAL HIGH (ref 65–99)
Potassium: 4.3 mmol/L (ref 3.5–5.3)
Sodium: 137 mmol/L (ref 135–146)
Total Bilirubin: 0.8 mg/dL (ref 0.2–1.2)
Total Protein: 7.3 g/dL (ref 6.1–8.1)

## 2021-02-23 LAB — HEPATITIS C RNA QUANTITATIVE
HCV Quantitative Log: <1.18 log IU/mL
HCV RNA, PCR, QN: <15 IU/mL

## 2021-02-26 ENCOUNTER — Ambulatory Visit: Payer: Self-pay | Admitting: Urology

## 2021-02-26 ENCOUNTER — Telehealth: Payer: Self-pay

## 2021-02-26 NOTE — Telephone Encounter (Signed)
-----   Message from Kathlynn Grate, DO sent at 02/26/2021  2:21 PM EDT ----- HCV RNA undetectable indicating HCV cure.  She can follow up as needed.

## 2021-02-26 NOTE — Telephone Encounter (Signed)
RN spoke to patient to advise her per Dr. Earlene Plater that her hepatitis C viral load is undetectable, indicating a cure and that there is no need for continued follow up at this time. Patient verbalized understanding and has no further questions.   Sandie Ano, RN

## 2021-03-05 ENCOUNTER — Encounter: Payer: Medicaid Other | Admitting: Physical Medicine and Rehabilitation

## 2021-03-16 ENCOUNTER — Other Ambulatory Visit: Payer: Self-pay

## 2021-03-16 ENCOUNTER — Encounter: Payer: Self-pay | Admitting: Physical Medicine and Rehabilitation

## 2021-03-16 ENCOUNTER — Encounter: Payer: Medicaid Other | Attending: Registered Nurse | Admitting: Physical Medicine and Rehabilitation

## 2021-03-16 VITALS — BP 141/88 | HR 73 | Temp 98.9°F | Ht 64.0 in | Wt 126.8 lb

## 2021-03-16 DIAGNOSIS — G8222 Paraplegia, incomplete: Secondary | ICD-10-CM | POA: Diagnosis not present

## 2021-03-16 DIAGNOSIS — G894 Chronic pain syndrome: Secondary | ICD-10-CM | POA: Diagnosis not present

## 2021-03-16 DIAGNOSIS — N319 Neuromuscular dysfunction of bladder, unspecified: Secondary | ICD-10-CM | POA: Diagnosis not present

## 2021-03-16 DIAGNOSIS — M792 Neuralgia and neuritis, unspecified: Secondary | ICD-10-CM

## 2021-03-16 MED ORDER — BACLOFEN 10 MG PO TABS
15.0000 mg | ORAL_TABLET | Freq: Four times a day (QID) | ORAL | 5 refills | Status: DC
Start: 2021-03-16 — End: 2021-06-18

## 2021-03-16 NOTE — Patient Instructions (Signed)
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     2. Increase baclofen to 15 mg 4x/day- can take 3-4x/day- but minimum of 3x/day.  Doesn't make sedated- fyi  3. Medicaid not paying for lidoderm patches- does have Government policy- see if will cover with prior authorization-  Just let me know by phone or mychart about lidoderm. Needs to use daily 2-3 patches 12 hrs on;12 hrs.   4.  Has appointment with Dr Sherron Monday end of May 2022.   5. F/U in 3 months

## 2021-03-16 NOTE — Progress Notes (Signed)
Subjective:    Patient ID: Debra Schmidt, female    DOB: March 03, 1961, 60 y.o.   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   Increased Baclofen at last visit- has helped Really needs it, esp at night.  Still needs 2 hours to get going in the morning.  Took baclofen- last around 1:40 pm-  Has been a few times has taken 1.5 pills or 2 pills- 1-2x/week.  Is painful to be so tight.  Cannot sit down- tries to keep moving- will stand to eat, so doesn't tighten up.    Walking around and working in the yard.  Helps her be more limber.   Incontinence- that's regular- didn't see Urology/Dr MacDiarmid yet.  Set Appt for 4/18-had to reschedule. Is now 5/23.   Only got Lidoderm - x1- Medicaid won't cover.    Fell 1 month ago- took 3 days to feel pain.  Hasn't noticed anything major- had to take tylenol more regularly.    Pain Inventory Average Pain 7 Pain Right Now 7 My pain is constant and aching  In the last 24 hours, has pain interfered with the following? General activity 5 Relation with others 5 Enjoyment of life 1 What TIME of day is your pain at its worst? morning  Sleep (in general) Good  Pain is worse with: bending and some activites Pain improves with: medication Relief from Meds: 3  Family History  Problem Relation Age of Onset  . Cancer Mother   . Cancer Father    Social History   Socioeconomic History  . Marital status: Media planner    Spouse name: Not on file  . Number of children: Not on file  . Years of education: Not on file  . Highest education level: Not on file  Occupational History  . Not on file  Tobacco Use  . Smoking status: Current  Every Day Smoker    Packs/day: 0.30    Types: Cigarettes  . Smokeless tobacco: Never Used  Substance and Sexual Activity  . Alcohol use: Not Currently  . Drug use: Not Currently  . Sexual activity: Not on file  Other Topics Concern  . Not on file  Social History Narrative  . Not on file   Social Determinants of Health   Financial Resource Strain: Not on file  Food Insecurity: Not on file  Transportation Needs: Not on file  Physical Activity: Not on file  Stress: Not on file  Social Connections: Not on file   Past Surgical History:  Procedure Laterality Date  . ACROMIO-CLAVICULAR JOINT REPAIR Right 04/25/2020   Procedure: ACROMIO-CLAVICULAR JOINT REPAIR;  Surgeon: Sheral Apley, MD;  Location: [facility] OR;  Service: Orthopedics;  Laterality: Right;  . I & D EXTREMITY Right 04/25/2020   Procedure: Open debridement R AC joint; Distal clavicle repair;  Surgeon: Sheral Apley, MD;  Location: [facility] OR;  Service: Orthopedics;  Laterality: Right;  . LUMBAR LAMINECTOMY/DECOMPRESSION MICRODISCECTOMY N/A 04/23/2020   Procedure: Lumbar One-Two LAMINECTOMY for Epidural Abscess Evacuation;  Surgeon: Coletta Memos, MD;  Location: [facility] OR;  Service: Neurosurgery;  Laterality: N/A;  . RESECTION DISTAL CLAVICAL Right 04/25/2020   Procedure: RESECTION DISTAL CLAVICAL;  Surgeon: Sheral Apley, MD;  Location: [facility] OR;  Service: Orthopedics;  Laterality: Right;   Past Surgical History:  Procedure Laterality Date  . ACROMIO-CLAVICULAR JOINT REPAIR Right 04/25/2020   Procedure: ACROMIO-CLAVICULAR JOINT REPAIR;  Surgeon: Sheral Apley, MD;  Location[facility] OR;  Service: Orthopedics;  Laterality: Right;  . I & D EXTREMITY Right 04/25/2020   Procedure: Open debridement R AC joint; Distal clavicle repair;  Surgeon: Sheral Apley, MD;  Location: [facility] OR;  Service: Orthopedics;  Laterality: Right;  . LUMBAR LAMINECTOMY/DECOMPRESSION MICRODISCECTOMY N/A 04/23/2020   Procedure: Lumbar One-Two LAMINECTOMY for  Epidural Abscess Evacuation;  Surgeon: Coletta Memos, MD;  Location: [facility] OR;  Service: Neurosurgery;  Laterality: N/A;  . RESECTION DISTAL CLAVICAL Right 04/25/2020   Procedure: RESECTION DISTAL CLAVICAL;  Surgeon: Sheral Apley, MD;  Location: [facility] OR;  Service: Orthopedics;  Laterality: Right;   No past medical history on file. BP (!) 141/88   Pulse 73   Temp 98.9 F (37.2 C)   Ht 5\' 4"  (1.626 m)   Wt 126 lb 12.8 oz (57.5 kg)   SpO2 95%   BMI 21.77 kg/m   Opioid Risk Score:   Fall Risk Score:  `1  Depression screen PHQ 2/9  Depression screen [facility] 2/9 02/20/2021 07/27/2020 06/08/2020  Decreased Interest 0 0 0  Down, Depressed, Hopeless 0 1 0  PHQ - 2 Score 0 1 0  Altered sleeping - - 0  Tired, decreased energy - - 0  Change in appetite - - 0  Feeling bad or failure about yourself  - - 0  Trouble concentrating - - 0  Moving slowly or fidgety/restless - - 0  Suicidal thoughts - - 0  PHQ-9 Score - - 0    Review of Systems  Musculoskeletal: Positive for back pain.       Left side pain Hip pain  All other systems reviewed and are negative.      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 Has failed UDS x2- with fentanyl in the drug screen.  Not allowed to prescribe pain meds anymore, per clinic and hospital policy.  Happy to continue to care for pt, but not for pain.   2. Increase baclofen to 15 mg 4x/day- can take 3-4x/day- but minimum of 3x/day.  Doesn't make sedated- fyi  3. Medicaid not  paying for lidoderm patches- does have Government policy- see if will cover with prior authorization-  Just let me know by phone or mychart about lidoderm. Needs to use daily 2-3 patches 12 hrs on;12 hrs.   4.  Has appointment with Dr 06/05/20 end of May 2022.   5. F/U in 3 months  Spent a total of 20 minutes discussing what to do for spasticity- most of pain is spasticity- and NEEDS lidoderm patches- discussed how to try and get.

## 2021-04-02 ENCOUNTER — Other Ambulatory Visit: Payer: Self-pay

## 2021-04-02 ENCOUNTER — Ambulatory Visit (INDEPENDENT_AMBULATORY_CARE_PROVIDER_SITE_OTHER): Payer: Medicaid Other | Admitting: Urology

## 2021-04-02 ENCOUNTER — Encounter: Payer: Self-pay | Admitting: Urology

## 2021-04-02 VITALS — BP 172/78 | HR 103 | Ht 64.0 in | Wt 126.0 lb

## 2021-04-02 DIAGNOSIS — N1330 Unspecified hydronephrosis: Secondary | ICD-10-CM

## 2021-04-02 DIAGNOSIS — N319 Neuromuscular dysfunction of bladder, unspecified: Secondary | ICD-10-CM | POA: Diagnosis not present

## 2021-04-02 DIAGNOSIS — N3946 Mixed incontinence: Secondary | ICD-10-CM

## 2021-04-02 NOTE — Progress Notes (Signed)
04/02/2021 2:20 PM   Debra Schmidt [birth year] [id number]  Referring provider: Genice Rouge, MD [address] [place],  [place] [address]  Chief Complaint  Patient presents with  . neurogenic bladder    HPI: I was consulted to assess the patient who was an incomplete paraplegic L1-L2.  She has had osteomyelitis as well as an intraspinal abscess with laminectomy.  Has been on Lidoderm patches  The patient describes performing clean intermittent catheterization after her back surgery and spinal event a year ago for 2 months.  She was also having a lot of bowel dysfunction.  Now she has urge incontinence especially holds it too long in public.  Otherwise she will wear 2 pads a day and does reasonably well with lower volume leakage.  No stress incontinence.  1 or 2 nights a week she has bedwetting.  Her flow was good and she feels empty  As prior to event last year she also had a stroke.  She might have some decrease sensation around the rectum but is minimal.  Bowel movements otherwise normal but tends towards constipation  Has no history of bladder surgery or bladder infections.  Had a small kidney stone years ago.  No medical treatment     PMH: No past medical history on file.  Surgical History: Past Surgical History:  Procedure Laterality Date  . ACROMIO-CLAVICULAR JOINT REPAIR Right 04/25/2020   Procedure: ACROMIO-CLAVICULAR JOINT REPAIR;  Surgeon: Sheral Apley, MD;  Location: [facility] OR;  Service: Orthopedics;  Laterality: Right;  . I & D EXTREMITY Right 04/25/2020   Procedure: Open debridement R AC joint; Distal clavicle repair;  Surgeon: Sheral Apley, MD;  Location: [facility] OR;  Service: Orthopedics;  Laterality: Right;  . LUMBAR LAMINECTOMY/DECOMPRESSION MICRODISCECTOMY N/A 04/23/2020   Procedure: Lumbar One-Two LAMINECTOMY for Epidural Abscess Evacuation;  Surgeon: Coletta Memos, MD;  Location: [facility] OR;  Service: Neurosurgery;  Laterality: N/A;  . RESECTION  DISTAL CLAVICAL Right 04/25/2020   Procedure: RESECTION DISTAL CLAVICAL;  Surgeon: Sheral Apley, MD;  Location: [facility] OR;  Service: Orthopedics;  Laterality: Right;    Home Medications:  Allergies as of 04/02/2021      Reactions   Adhesive [tape]    Penicillins Hives   Tolerated Zosyn and Cefazolin 04/2020      Medication List       Accurate as of Apr 02, 2021  2:20 PM. If you have any questions, ask your nurse or doctor.        STOP taking these medications   lidocaine 2 % jelly Commonly known as: XYLOCAINE Stopped by: Martina Sinner, MD     TAKE these medications   acetaminophen 325 MG tablet Commonly known as: TYLENOL Take 2 tablets (650 mg total) by mouth 3 (three) times daily.   baclofen 10 MG tablet Commonly known as: LIORESAL Take 1.5 tablets (15 mg total) by mouth 4 (four) times daily. For spasticity   calcium citrate 950 (200 Ca) MG tablet Commonly known as: CALCITRATE - dosed in mg elemental calcium Take 1 tablet (200 mg of elemental calcium total) by mouth daily.   docusate sodium 100 MG capsule Commonly known as: COLACE Take 2 capsules (200 mg total) by mouth daily.   lidocaine 5 % Commonly known as: LIDODERM PLACE 3 PATCHES ONTO THE SKIN DAILY. REMOVE & DISCARD PATCH WITHIN 12 HOURS OR AS DIRECTED BY MD   magnesium oxide 400 (241.3 Mg) MG tablet Commonly known as: MAG-OX Take 0.5 tablets (  200 mg total) by mouth 2 (two) times daily.   multivitamin with minerals Tabs tablet Take 1 tablet by mouth daily.   pantoprazole 40 MG tablet Commonly known as: PROTONIX Take 1 tablet (40 mg total) by mouth at bedtime.   polyethylene glycol 17 g packet Commonly known as: MIRALAX / GLYCOLAX Take 17 g by mouth daily as needed for mild constipation.   potassium chloride SA 20 MEQ tablet Commonly known as: KLOR-CON Take 2 tablets (40 mEq total) by mouth daily.       Allergies:  Allergies  Allergen Reactions  . Adhesive [Tape]   . Penicillins Hives     Tolerated Zosyn and Cefazolin 04/2020    Family History: Family History  Problem Relation Age of Onset  . Cancer Mother   . Cancer Father     Social History:  reports that she has been smoking cigarettes. She has been smoking about 0.30 packs per day. She has never used smokeless tobacco. She reports previous alcohol use. She reports previous drug use.  ROS:                                        Physical Exam: There were no vitals taken for this visit.  Constitutional:  Alert and oriented, No acute distress. HEENT: [place] AT, moist mucus membranes.  Trachea midline, no masses. Cardiovascular: No clubbing, cyanosis, or edema. Respiratory: Normal respiratory effort, no increased work of breathing. GI: Abdomen is soft, nontender, nondistended, no abdominal masses GU: No CVA tenderness.  Small grade 2 cystocele in the higher vaginal vault.  Small distal asymptomatic rectocele.  Well supported bladder neck and no stress incontinence Skin: No rashes, bruises or suspicious lesions. Lymph: No cervical or inguinal adenopathy. Neurologic: Grossly intact, no focal deficits, moving all 4 extremities. Psychiatric: Normal mood and affect.  Laboratory Data: Lab Results  Component Value Date   WBC 7.6 06/22/2020   HGB 14.5 06/22/2020   HCT 44.9 06/22/2020   MCV 86.3 06/22/2020   PLT 389 06/22/2020    Lab Results  Component Value Date   CREATININE 0.70 02/20/2021    No results found for: PSA  No results found for: TESTOSTERONE  Lab Results  Component Value Date   HGBA1C 6.7 (H) 04/24/2020    Urinalysis    Component Value Date/Time   COLORURINE YELLOW 05/17/2020 1542   APPEARANCEUR CLEAR 05/17/2020 1542   LABSPEC 1.015 05/17/2020 1542   PHURINE 6.0 05/17/2020 1542   GLUCOSEU NEGATIVE 05/17/2020 1542   HGBUR NEGATIVE 05/17/2020 1542   BILIRUBINUR NEGATIVE 05/17/2020 1542   KETONESUR NEGATIVE 05/17/2020 1542   PROTEINUR NEGATIVE 05/17/2020 1542    NITRITE NEGATIVE 05/17/2020 1542   LEUKOCYTESUR NEGATIVE 05/17/2020 1542    Pertinent Imaging: Urine reviewed.  Urine sent for culture.  Chart reviewed  Assessment & Plan: Patient primarily has urge incontinence and bedwetting secondary to neurogenic bladder.  The role of urodynamics and cystoscopy discussed.  She has not had an upper tract x-ray since last April.  Kidneys were normal.  Renal ultrasound ordered.  Urodynamics ordered.  Patient understands concept of high bladder pressures.   1. Neurogenic bladder  - Urinalysis, Complete   No follow-ups on file.  Martina Sinner, MD  [facility] [address], [address] [place], [place] [address] [phone]

## 2021-04-03 LAB — URINALYSIS, COMPLETE
Bilirubin, UA: NEGATIVE
Glucose, UA: NEGATIVE
Ketones, UA: NEGATIVE
Leukocytes,UA: NEGATIVE
Nitrite, UA: POSITIVE — AB
Protein,UA: NEGATIVE
RBC, UA: NEGATIVE
Specific Gravity, UA: 1.01 (ref 1.005–1.030)
Urobilinogen, Ur: 0.2 mg/dL (ref 0.2–1.0)
pH, UA: 6 (ref 5.0–7.5)

## 2021-04-03 LAB — MICROSCOPIC EXAMINATION

## 2021-04-05 LAB — CULTURE, URINE COMPREHENSIVE

## 2021-04-11 ENCOUNTER — Telehealth: Payer: Self-pay

## 2021-04-11 MED ORDER — NITROFURANTOIN MONOHYD MACRO 100 MG PO CAPS: 100.0000 mg | ORAL_CAPSULE | Freq: Two times a day (BID) | ORAL | 0 refills | Status: DC

## 2021-04-11 NOTE — Telephone Encounter (Signed)
Pt aware of results. Medication sent to correct pharmacy. Pt aware.

## 2021-04-11 NOTE — Telephone Encounter (Signed)
-----   Message from Levada Schilling, [place] sent at 04/11/2021  9:42 AM EDT -----  ----- Message ----- From: Alfredo Martinez, MD Sent: 04/11/2021   9:28 AM EDT To: Levada Schilling, CMA  Macrodantin 100 mg twice a day for 7 days   ----- Message ----- From: Levada Schilling, CMA Sent: 04/04/2021   7:55 AM EDT To: Alfredo Martinez, MD   ----- Message ----- From: Interface, [facility] Lab Results In Sent: 04/03/2021  11:37 AM EDT To: Jennette Kettle Clinical

## 2021-04-18 ENCOUNTER — Ambulatory Visit: Payer: Medicaid Other

## 2021-04-18 ENCOUNTER — Ambulatory Visit: Payer: Medicaid Other | Admitting: Physical Medicine and Rehabilitation

## 2021-04-20 DIAGNOSIS — N3941 Urge incontinence: Secondary | ICD-10-CM | POA: Diagnosis not present

## 2021-04-23 ENCOUNTER — Other Ambulatory Visit: Payer: Self-pay | Admitting: Urology

## 2021-05-17 ENCOUNTER — Other Ambulatory Visit: Payer: Self-pay

## 2021-05-17 ENCOUNTER — Ambulatory Visit
Admission: RE | Admit: 2021-05-17 | Discharge: 2021-05-17 | Disposition: A | Discharge status: Home / Self Care | Payer: Medicaid Other | Source: Ambulatory Visit | Attending: Urology | Admitting: Urology

## 2021-05-17 DIAGNOSIS — N1330 Unspecified hydronephrosis: Secondary | ICD-10-CM | POA: Insufficient documentation

## 2021-05-17 DIAGNOSIS — N319 Neuromuscular dysfunction of bladder, unspecified: Secondary | ICD-10-CM | POA: Diagnosis not present

## 2021-05-17 IMAGING — US US RENAL
1 series · 14 of 25 positions shown · non-contrast
Comparison: Right upper quadrant ultrasound [DATE]

CLINICAL DATA: Hydronephrosis and neurogenic bladder.

EXAM:
RENAL / URINARY TRACT ULTRASOUND COMPLETE

[Series 1: us renal · 0.25mm/px · 14 of 44 slices shown]
[im 1/44]
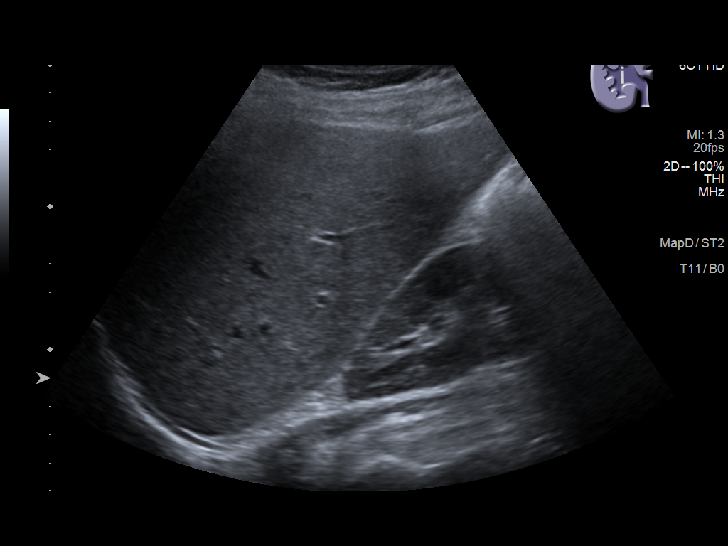
[im 4/44]
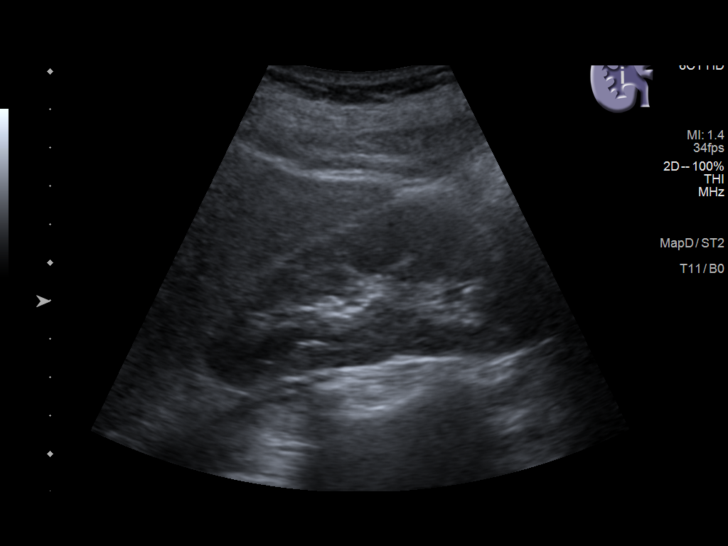
[im 8/44]
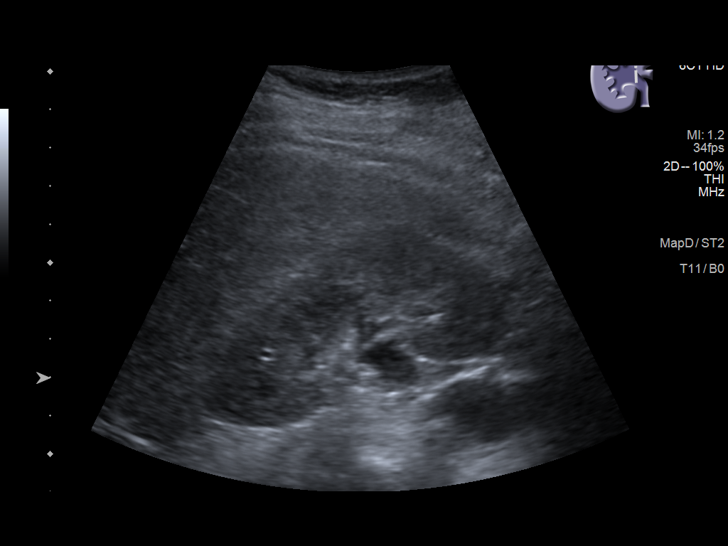
[im 11/44]
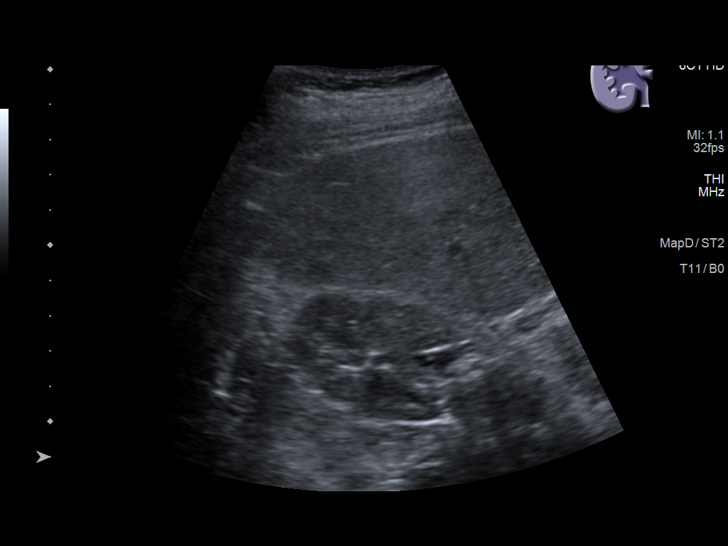
[im 15/44]
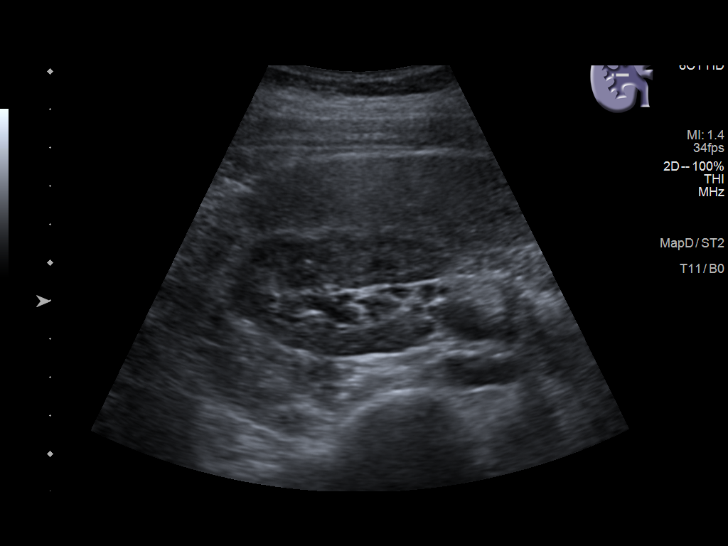
[im 17/44]
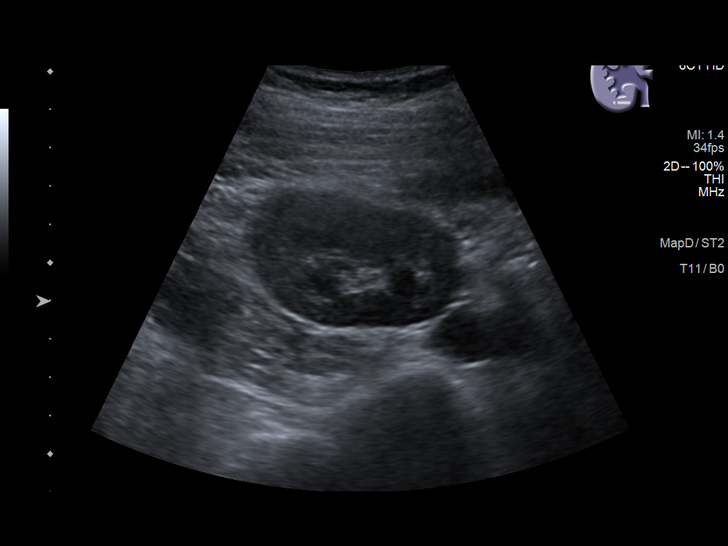
[im 20/44]
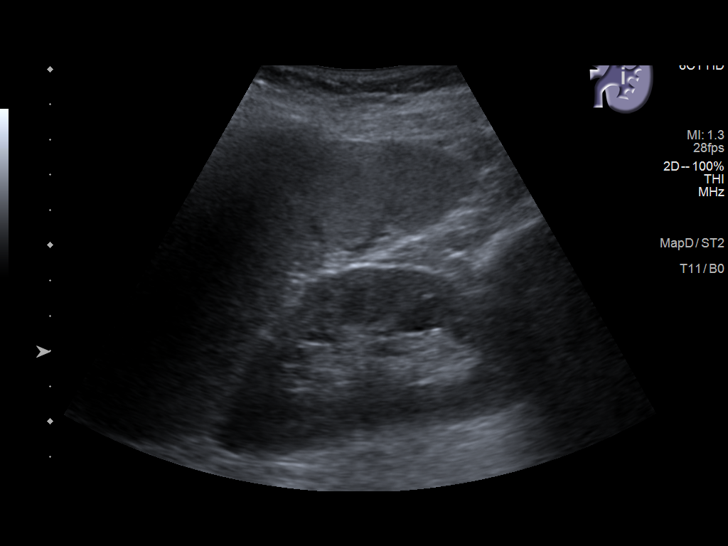
[im 24/44]
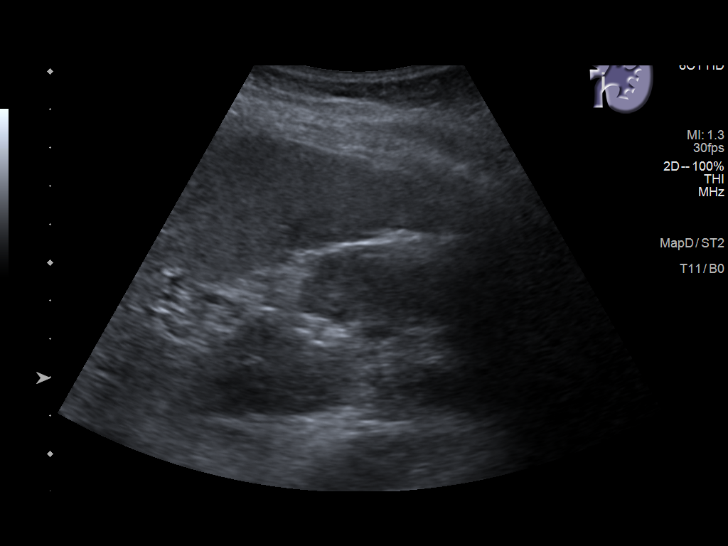
[im 27/44]
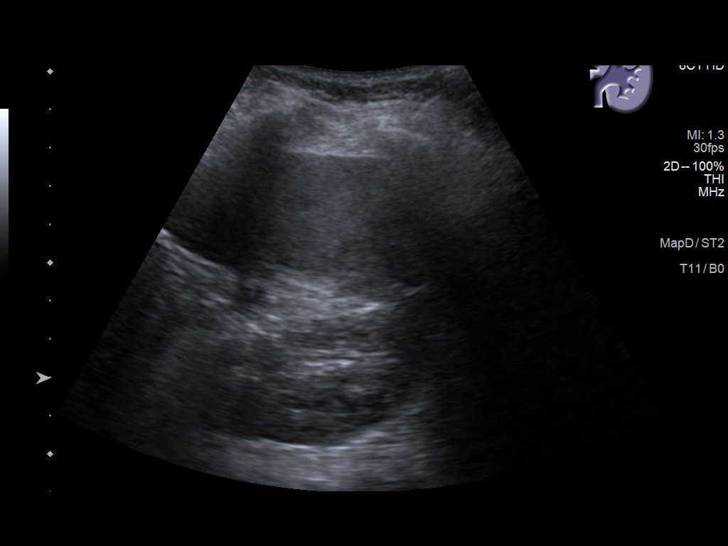
[im 29/44]
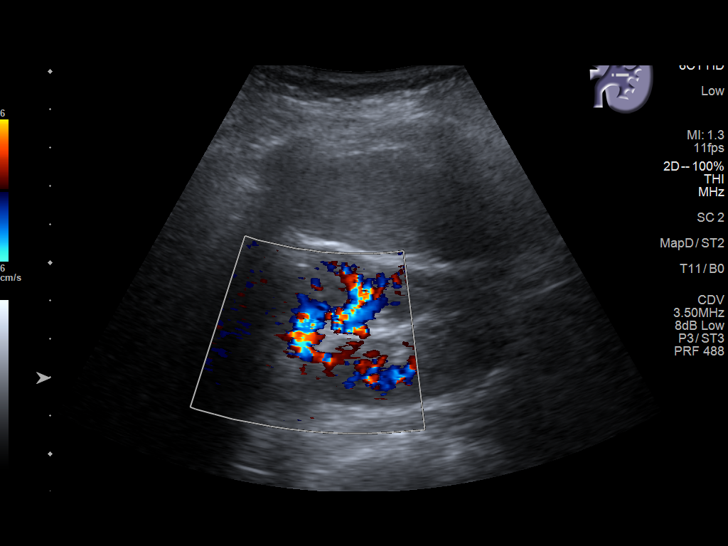
[im 33/44]
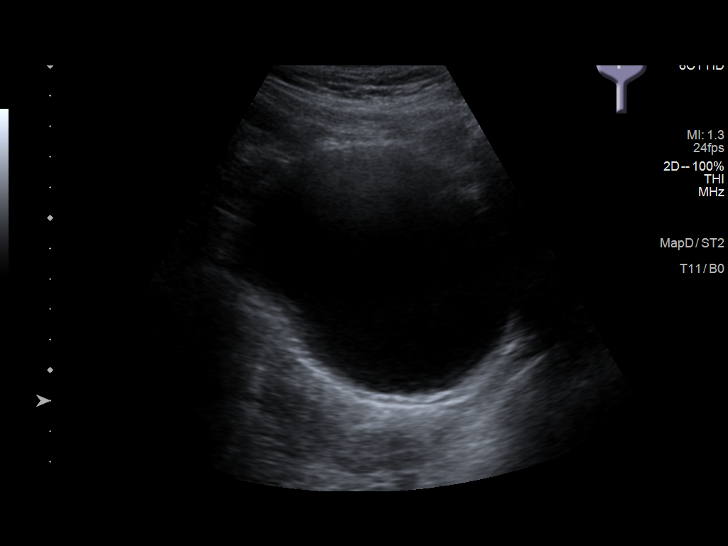
[im 36/44]
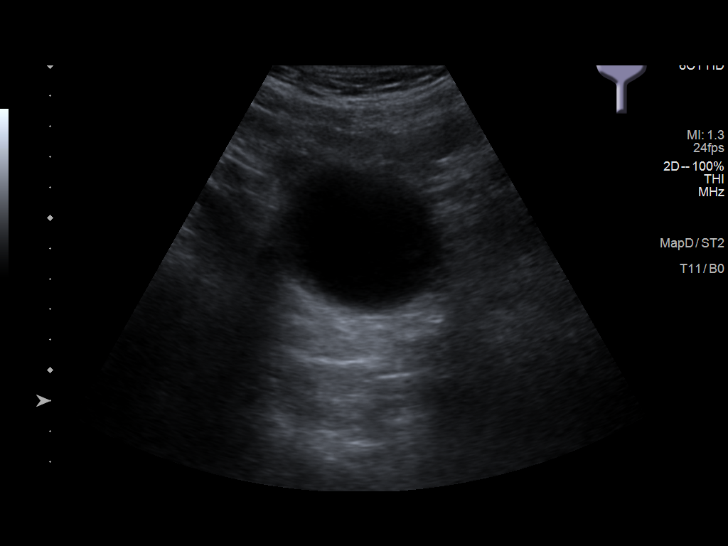
[im 40/44]
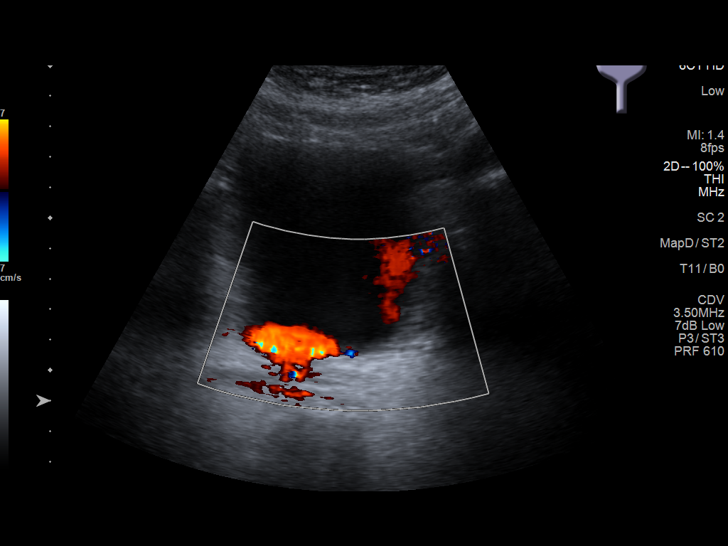
[im 44/44]
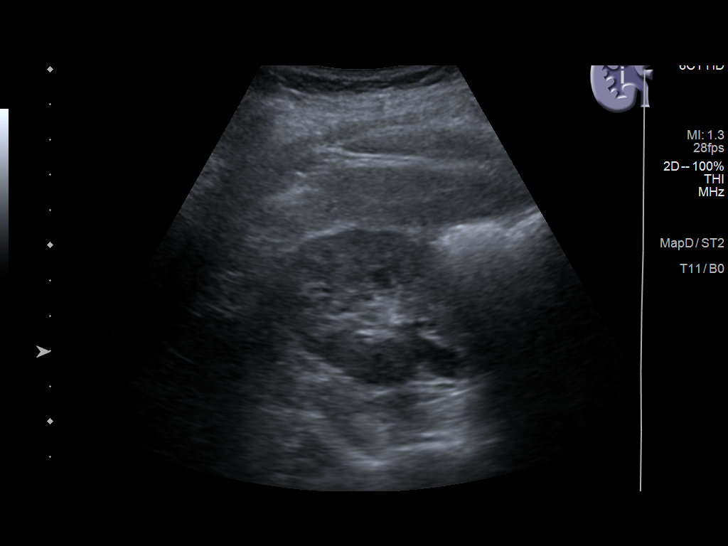

[14 of 25 positions shown; findings below may reference images not displayed]

FINDINGS: Right Kidney:

Renal measurements: 9 x 3.9 x 5 cm = volume: 91 mL. Echogenicity
within normal limits. No mass or hydronephrosis visualized.

Left Kidney:

Renal measurements: 9.8 x 4.5 x 5 cm = volume: 115 mL. Echogenicity
within normal limits. No mass or hydronephrosis visualized.

Bladder:

Appears normal for degree of bladder distention. Postvoid imaging
was not obtained.

Other:

None.
IMPRESSION: Normal ultrasound appearance of the kidneys and bladder.

## 2021-05-28 ENCOUNTER — Ambulatory Visit: Payer: Self-pay | Admitting: Urology

## 2021-06-18 ENCOUNTER — Other Ambulatory Visit: Payer: Self-pay

## 2021-06-18 ENCOUNTER — Encounter: Payer: Self-pay | Admitting: Physical Medicine and Rehabilitation

## 2021-06-18 ENCOUNTER — Encounter: Payer: Medicaid Other | Attending: Registered Nurse | Admitting: Physical Medicine and Rehabilitation

## 2021-06-18 ENCOUNTER — Ambulatory Visit: Payer: Self-pay | Admitting: Urology

## 2021-06-18 VITALS — BP 154/90 | HR 96 | Temp 99.0°F | Ht 64.0 in | Wt 125.6 lb

## 2021-06-18 DIAGNOSIS — G894 Chronic pain syndrome: Secondary | ICD-10-CM | POA: Diagnosis not present

## 2021-06-18 DIAGNOSIS — M792 Neuralgia and neuritis, unspecified: Secondary | ICD-10-CM | POA: Diagnosis not present

## 2021-06-18 DIAGNOSIS — G8222 Paraplegia, incomplete: Secondary | ICD-10-CM | POA: Diagnosis not present

## 2021-06-18 DIAGNOSIS — M7918 Myalgia, other site: Secondary | ICD-10-CM | POA: Diagnosis not present

## 2021-06-18 DIAGNOSIS — N319 Neuromuscular dysfunction of bladder, unspecified: Secondary | ICD-10-CM | POA: Diagnosis not present

## 2021-06-18 MED ORDER — BACLOFEN 10 MG PO TABS
20.0000 mg | ORAL_TABLET | Freq: Four times a day (QID) | ORAL | 5 refills | Status: DC
Start: 2021-06-18 — End: 2021-09-17

## 2021-06-18 NOTE — Patient Instructions (Addendum)
  Patient  Is a 60 yr old female with history of tob dep, , incomplete paraplegia, L1-L2 discitis with osteomyelitis, intra-spinal abscess s/p laminectomy L1-L2, chronic hep C, neurogenic bladder, osteomyelitis of right clavicle,has some spasticity for some reason? 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  Developing torticollis/cervical dystonia? From myofascial issues- Theracane - DON;'T massage- hold pressure 2-4 minutes on each spot that hurts- around 30 minutes total for round of muscles- youtube videos that show you how to use the theracane as well. At least 3 days/week; some of my patients do every day, don't do more than 1x/day.   2. Has appointment to work on teeth starting tomorrow.   3. Try neck cut out pillow. Can help with neck pain.  Look online or in stores to find one.   4. Biotene-gum, toothpaste, etc- that helps with dry mouth- helps you produce more saliva.   5. Will increase Baclofen to 20 mg 4x/day. Won't send Rx since has a lot of them at home- put in computer.   6. Pt IS permanently disabled- she cannot walk more than 100 ft at a time with cane and has spasticity and neurogenic bladder- has incontinence- cannot plan on it bowel and bladder issues, so unable to work around this. If additional paperwork required, let me know.   7. F/U in 3 months- double appointment due to SCI. If treatments for neck doesn't help, CALL ME to get put on waiting list for trigger point injections.

## 2021-06-18 NOTE — Progress Notes (Signed)
Subjective:    Patient ID: Debra Schmidt, female    DOB: 14-Sep-1961, 60 y.o.   MRN: [id number]  HPI Patient  Is a 60 yr old female with history of tob dep, , incomplete paraplegia, L1-L2 discitis with osteomyelitis, intra-spinal abscess s/p laminectomy L1-L2, chronic hep C, neurogenic bladder, osteomyelitis of right clavicle, S/p IV ABX as of 06/03/20 Is an incomplete paraplegic due to osteomyelitis   Here for f/u on chronic pain and paraplegia.    Things "OK".  From the last fall she had, neck pushes forward a lot- since that last fall.  Has to push it kind of hard to push the neck into extension.     Also sometimes sleeps in reclining cough and occ bed.  Hasn't changed her pillow lately.   Has had urodynamics- getting results this week.    Still has spasms- Baclofen really helps some.  Sometimes takes 2 tabs of Baclofen instead of 1.5 tabs.    Pain Inventory Average Pain 6 Pain Right Now 6 My pain is constant and aching  In the last 24 hours, has pain interfered with the following? General activity 5 Relation with others 5 Enjoyment of life 1 What TIME of day is your pain at its worst? morning  Sleep (in general) Fair  Pain is worse with: bending and some activites Pain improves with: medication Relief from Meds: 3  Family History  Problem Relation Age of Onset   Cancer Mother    Cancer Father    Social History   Socioeconomic History   Marital status: Media planner    Spouse name: Not on file   Number of children: Not on file   Years of education: Not on file   Highest education level: Not on file  Occupational History   Not on file  Tobacco Use   Smoking status: Every Day    Packs/day: 0.30    Types: Cigarettes   Smokeless tobacco: Never  Substance and Sexual Activity   Alcohol use: Not Currently   Drug use: Not Currently   Sexual activity: Not on file  Other Topics Concern   Not on file  Social History Narrative   Not on file    Social Determinants of Health   Financial Resource Strain: Not on file  Food Insecurity: Not on file  Transportation Needs: Not on file  Physical Activity: Not on file  Stress: Not on file  Social Connections: Not on file   Past Surgical History:  Procedure Laterality Date   ACROMIO-CLAVICULAR JOINT REPAIR Right 04/25/2020   Procedure: ACROMIO-CLAVICULAR JOINT REPAIR;  Surgeon: Sheral Apley, MD;  Location: [facility] OR;  Service: Orthopedics;  Laterality: Right;   I & D EXTREMITY Right 04/25/2020   Procedure: Open debridement R AC joint; Distal clavicle repair;  Surgeon: Sheral Apley, MD;  Location: [facility] OR;  Service: Orthopedics;  Laterality: Right;   LUMBAR LAMINECTOMY/DECOMPRESSION MICRODISCECTOMY N/A 04/23/2020   Procedure: Lumbar One-Two LAMINECTOMY for Epidural Abscess Evacuation;  Surgeon: Coletta Memos, MD;  Location: [facility] OR;  Service: Neurosurgery;  Laterality: N/A;   RESECTION DISTAL CLAVICAL Right 04/25/2020   Procedure: RESECTION DISTAL CLAVICAL;  Surgeon: Sheral Apley, MD;  Location: [facility] OR;  Service: Orthopedics;  Laterality: Right;   Past Surgical History:  Procedure Laterality Date   ACROMIO-CLAVICULAR JOINT REPAIR Right 04/25/2020   Procedure: ACROMIO-CLAVICULAR JOINT REPAIR;  Surgeon: Sheral Apley, MD;  Location: [facility] OR;  Service: Orthopedics;  Laterality: Right;   I & D EXTREMITY  Right 04/25/2020   Procedure: Open debridement R AC joint; Distal clavicle repair;  Surgeon: Sheral Apley, MD;  Location: [facility] OR;  Service: Orthopedics;  Laterality: Right;   LUMBAR LAMINECTOMY/DECOMPRESSION MICRODISCECTOMY N/A 04/23/2020   Procedure: Lumbar One-Two LAMINECTOMY for Epidural Abscess Evacuation;  Surgeon: Coletta Memos, MD;  Location: [facility] OR;  Service: Neurosurgery;  Laterality: N/A;   RESECTION DISTAL CLAVICAL Right 04/25/2020   Procedure: RESECTION DISTAL CLAVICAL;  Surgeon: Sheral Apley, MD;  Location: [facility] OR;  Service: Orthopedics;  Laterality: Right;   No past  medical history on file. BP (!) 154/90 (BP Location: Left Arm, Patient Position: Sitting, Cuff Size: Normal)   Pulse 96   Temp 99 F (37.2 C) (Oral)   Ht 5\' 4"  (1.626 m)   Wt 125 lb 9.6 oz (57 kg)   SpO2 95%   BMI 21.56 kg/m   Opioid Risk Score:   Fall Risk Score:  `1  Depression screen PHQ 2/9  Depression screen [facility] 2/9 06/18/2021 02/20/2021 07/27/2020 06/08/2020  Decreased Interest 0 0 0 0  Down, Depressed, Hopeless 0 0 1 0  PHQ - 2 Score 0 0 1 0  Altered sleeping - - - 0  Tired, decreased energy - - - 0  Change in appetite - - - 0  Feeling bad or failure about yourself  - - - 0  Trouble concentrating - - - 0  Moving slowly or fidgety/restless - - - 0  Suicidal thoughts - - - 0  PHQ-9 Score - - - 0     Review of Systems  Musculoskeletal:  Positive for back pain and neck pain.       Right foot pain  All other systems reviewed and are negative.     Objective:   Physical Exam  Awake, alert, using single point cane to walk, NAD In neck flexion slightly, but also chin thrust at rest and gets worse as she's sitting there. Developing torticollis.  Tight with trigger points  in upper traps, levators, pecs and splenius capitus B/L.  MAS of 1+ to 2 in LE's- no clonus; no spasms seen     Assessment & Plan:    Patient  Is a 60 yr old female with history of tob dep, , incomplete paraplegia, L1-L2 discitis with osteomyelitis, intra-spinal abscess s/p laminectomy L1-L2, chronic hep C, neurogenic bladder, osteomyelitis of right clavicle,has some spasticity for some reason? S/p IV ABX as of 06/03/20 Is an incomplete paraplegic due to osteomyelitis- has developing torticollis of neck.   Developing torticollis- due to myofascial pain-  Theracane - DON'T massage- hold pressure 2-4 minutes on each spot that hurts- around 30 minutes total for round of muscles- youtube videos that show you how to use the theracane as well. At least 3 days/week; some of my patients do every day, don't do more  than 1x/day.   2. Has appointment to work on teeth starting tomorrow.   3. Try neck cut out pillow. Can help with neck pain.  Look online or in stores to find one.   4. Biotene-gum, toothpaste, etc- that helps with dry mouth- helps you produce more saliva.   5. Will increase Baclofen to 20 mg 4x/day. Won't send Rx since has a lot of them at home- put in computer.   6. Pt IS permanently disabled- she cannot walk more than 100 ft at a time with cane and has spasticity and neurogenic bladder- has incontinence- cannot plan on it bowel and bladder issues, so unable  to work around this. If additional paperwork required, let me know.   7. F/U in 3 months- double appointment due to SCI. If doesn't work with theracane- then needs trigger point injections at f/u     I spent a total of 31 minutes on visit- discussing spasticity and myofascial pain- demonstrated how to use theracane/treat myofascial pain and went over cervical dystonia.Marland Kitchen

## 2021-07-30 ENCOUNTER — Ambulatory Visit: Payer: Medicaid Other | Admitting: Urology

## 2021-08-23 ENCOUNTER — Telehealth: Payer: Self-pay | Admitting: Internal Medicine

## 2021-08-23 NOTE — Telephone Encounter (Signed)
..  Patient declines further follow up and engagement by the Managed Medicaid Team. Appropriate care team members and provider have been notified via electronic communication. The Managed Medicaid Team is available to follow up with the patient after provider conversation with the patient regarding recommendation for engagement and subsequent re-referral to the Managed Medicaid Team.    Jennifer Alley Care Guide, High Risk Medicaid Managed Care Embedded Care Coordination [place]  [facility]   

## 2021-08-27 ENCOUNTER — Encounter: Payer: Self-pay | Admitting: Physical Medicine and Rehabilitation

## 2021-08-27 ENCOUNTER — Encounter: Payer: Medicaid Other | Attending: Registered Nurse | Admitting: Physical Medicine and Rehabilitation

## 2021-08-27 ENCOUNTER — Other Ambulatory Visit: Payer: Self-pay

## 2021-08-27 VITALS — BP 225/134 | HR 126 | Temp 98.4°F | Ht 64.0 in | Wt 122.0 lb

## 2021-08-27 DIAGNOSIS — G8222 Paraplegia, incomplete: Secondary | ICD-10-CM

## 2021-08-27 DIAGNOSIS — M7918 Myalgia, other site: Secondary | ICD-10-CM

## 2021-08-27 DIAGNOSIS — M792 Neuralgia and neuritis, unspecified: Secondary | ICD-10-CM | POA: Diagnosis not present

## 2021-08-27 DIAGNOSIS — G894 Chronic pain syndrome: Secondary | ICD-10-CM

## 2021-08-27 DIAGNOSIS — M436 Torticollis: Secondary | ICD-10-CM

## 2021-08-27 NOTE — Progress Notes (Signed)
Subjective:    Patient ID: Debra Schmidt, female    DOB: October 26, 1961, 60 y.o.   MRN: [id number]  HPI  Patient  Is a 60 yr old female with history of tob dep, , incomplete paraplegia, L1-L2 discitis with osteomyelitis, intra-spinal abscess s/p laminectomy L1-L2, chronic hep C, neurogenic bladder, osteomyelitis of right clavicle,has some spasticity for some reason? S/p IV ABX as of 06/03/20 Is an incomplete paraplegic due to osteomyelitis- has developing torticollis of neck.    Here for trigger point injections- and f/u on SCI   BP 180s/117- said was rushing to et here and pain is really bothering her.   Head forces her forward due to weakness/posture.  And uses theracane a lot- makes her go to sleep- feels better.    Older son in prison- and took a plea. 4 years. Says he wasn't guilty.   Pain Inventory Average Pain 7 Pain Right Now 7 My pain is constant and aching  In the last 24 hours, has pain interfered with the following? General activity 7 Relation with others 0 Enjoyment of life 0 What TIME of day is your pain at its worst? evening Sleep (in general) Good  Pain is worse with: sitting Pain improves with: medication Relief from Meds: 5  Family History  Problem Relation Age of Onset   Cancer Mother    Cancer Father    Social History   Socioeconomic History   Marital status: Media planner    Spouse name: Not on file   Number of children: Not on file   Years of education: Not on file   Highest education level: Not on file  Occupational History   Not on file  Tobacco Use   Smoking status: Every Day    Packs/day: 0.30    Types: Cigarettes   Smokeless tobacco: Never  Vaping Use   Vaping Use: Never used  Substance and Sexual Activity   Alcohol use: Not Currently   Drug use: Not Currently   Sexual activity: Not on file  Other Topics Concern   Not on file  Social History Narrative   Not on file   Social Determinants of Health   Financial Resource  Strain: Not on file  Food Insecurity: Not on file  Transportation Needs: Not on file  Physical Activity: Not on file  Stress: Not on file  Social Connections: Not on file   Past Surgical History:  Procedure Laterality Date   ACROMIO-CLAVICULAR JOINT REPAIR Right 04/25/2020   Procedure: ACROMIO-CLAVICULAR JOINT REPAIR;  Surgeon: Sheral Apley, MD;  Location: [facility] OR;  Service: Orthopedics;  Laterality: Right;   I & D EXTREMITY Right 04/25/2020   Procedure: Open debridement R AC joint; Distal clavicle repair;  Surgeon: Sheral Apley, MD;  Location: [facility] OR;  Service: Orthopedics;  Laterality: Right;   LUMBAR LAMINECTOMY/DECOMPRESSION MICRODISCECTOMY N/A 04/23/2020   Procedure: Lumbar One-Two LAMINECTOMY for Epidural Abscess Evacuation;  Surgeon: Coletta Memos, MD;  Location: [facility] OR;  Service: Neurosurgery;  Laterality: N/A;   RESECTION DISTAL CLAVICAL Right 04/25/2020   Procedure: RESECTION DISTAL CLAVICAL;  Surgeon: Sheral Apley, MD;  Location: [facility] OR;  Service: Orthopedics;  Laterality: Right;   Past Surgical History:  Procedure Laterality Date   ACROMIO-CLAVICULAR JOINT REPAIR Right 04/25/2020   Procedure: ACROMIO-CLAVICULAR JOINT REPAIR;  Surgeon: Sheral Apley, MD;  Location: [facility] OR;  Service: Orthopedics;  Laterality: Right;   I & D EXTREMITY Right 04/25/2020   Procedure: Open debridement R AC joint; Distal clavicle  repair;  Surgeon: Sheral Apley, MD;  Location: [facility] OR;  Service: Orthopedics;  Laterality: Right;   LUMBAR LAMINECTOMY/DECOMPRESSION MICRODISCECTOMY N/A 04/23/2020   Procedure: Lumbar One-Two LAMINECTOMY for Epidural Abscess Evacuation;  Surgeon: Coletta Memos, MD;  Location: [facility] OR;  Service: Neurosurgery;  Laterality: N/A;   RESECTION DISTAL CLAVICAL Right 04/25/2020   Procedure: RESECTION DISTAL CLAVICAL;  Surgeon: Sheral Apley, MD;  Location: [facility] OR;  Service: Orthopedics;  Laterality: Right;   History reviewed. No pertinent past medical history. Temp 98.4  F (36.9 C)   Ht 5\' 4"  (1.626 m)   Wt 122 lb (55.3 kg)   SpO2 96%   BMI 20.94 kg/m   Opioid Risk Score:   Fall Risk Score:  `1  Depression screen PHQ 2/9  Depression screen [facility] 2/9 08/27/2021 06/18/2021 02/20/2021 07/27/2020 06/08/2020  Decreased Interest 1 0 0 0 0  Down, Depressed, Hopeless 1 0 0 1 0  PHQ - 2 Score 2 0 0 1 0  Altered sleeping - - - - 0  Tired, decreased energy - - - - 0  Change in appetite - - - - 0  Feeling bad or failure about yourself  - - - - 0  Trouble concentrating - - - - 0  Moving slowly or fidgety/restless - - - - 0  Suicidal thoughts - - - - 0  PHQ-9 Score - - - - 0    Review of Systems  Musculoskeletal:  Positive for back pain.  All other systems reviewed and are negative.     Objective:   Physical Exam  Flexed forward posture- and literally cannot sit up without falling over initially.  Also developing torticollis- forward posture- scalenes SO tight/bad trigger points b/l and levators, upper traps- less so splenius capitus and pecs.        Assessment & Plan:   Patient  Is a 60 yr old female with history of tob dep, , incomplete paraplegia, L1-L2 discitis with osteomyelitis, intra-spinal abscess s/p laminectomy L1-L2, chronic hep C, neurogenic bladder, osteomyelitis of right clavicle,has some spasticity for some reason? S/p IV ABX as of 06/03/20 Is an incomplete paraplegic due to osteomyelitis- has developing torticollis of neck.    Patient here for trigger point injections for developing torticollis   Consent done and on chart.  Cleaned areas with alcohol and injected using a 27 gauge 1.5 inch needle  Injected  3cc Using 1% Lidocaine with no EPI  Upper traps- B/L  Levators- B/L- lot of twitch responses Posterior scalenes- B/L  Middle scalenes Splenius Capitus- B/L  Pectoralis Major- B/L  Rhomboids Infraspinatus Teres Major/minor Thoracic paraspinals Lumbar paraspinals Other injections-   Patient's level of pain prior was  8/10 Current level of pain after injections is- 6/10  There was no bleeding or complications.  Patient was advised to drink a lot of water on day after injections to flush system Will have increased soreness for 12-48 hours after injections.  Can use Lidocaine patches the day AFTER injections Can use theracane on day of injections in places didn't inject Can use heating pad 4-6 hours AFTER injections  2. Needs to recheck BP 180/117- is at stroke level- will recheck before leaves today.   3. Needs to speak with PCP about BP- was 160s last visit and 180s today.    4. Staying with husband's father- and had to move- and son in prison- has to deal with stressors.    5. Still smoking- cut down some- some days 5-  10 cigarettes- counseled about cutting down more.   6. F/U in 2 months for trigger point injections- - hopefully can fit in.

## 2021-08-27 NOTE — Patient Instructions (Signed)
Patient  Is a 60 yr old female with history of tob dep, , incomplete paraplegia, L1-L2 discitis with osteomyelitis, intra-spinal abscess s/p laminectomy L1-L2, chronic hep C, neurogenic bladder, osteomyelitis of right clavicle,has some spasticity for some reason? S/p IV ABX as of 06/03/20 Is an incomplete paraplegic due to osteomyelitis- has developing torticollis of neck.    Patient here for trigger point injections for developing torticollis   Consent done and on chart.  Cleaned areas with alcohol and injected using a 27 gauge 1.5 inch needle  Injected  3cc Using 1% Lidocaine with no EPI  Upper traps- B/L  Levators- B/L- lot of twitch responses Posterior scalenes- B/L  Middle scalenes Splenius Capitus- B/L  Pectoralis Major- B/L  Rhomboids Infraspinatus Teres Major/minor Thoracic paraspinals Lumbar paraspinals Other injections-   Patient's level of pain prior was 8/10 Current level of pain after injections is- 6/10  There was no bleeding or complications.  Patient was advised to drink a lot of water on day after injections to flush system Will have increased soreness for 12-48 hours after injections.  Can use Lidocaine patches the day AFTER injections Can use theracane on day of injections in places didn't inject Can use heating pad 4-6 hours AFTER injections  2. Needs to recheck BP 180/117- is at stroke level- will recheck before leaves today.   3. Needs to speak with PCP about BP- was 160s last visit and 180s today.    4. Staying with husband's father- and had to move- and son in prison- has to deal with stressors.    5. Still smoking- cut down some- some days 5- 10 cigarettes- counseled about cutting down more.   6. F/U in 2 months for trigger point injections- - hopefully can fit in.

## 2021-08-28 ENCOUNTER — Telehealth: Payer: Self-pay | Admitting: Physical Medicine and Rehabilitation

## 2021-08-28 NOTE — Telephone Encounter (Signed)
Called ptn she stayed with EMT's for an hour last reading from memory was 49 /90 she will call primary and schedule to discuss bp meds.  Scheduled per ML notes for TP's in 2 months.

## 2021-09-03 ENCOUNTER — Ambulatory Visit: Payer: Medicaid Other | Admitting: Urology

## 2021-09-04 ENCOUNTER — Ambulatory Visit: Payer: Medicaid Other | Admitting: Physician Assistant

## 2021-09-06 ENCOUNTER — Encounter: Payer: Self-pay | Admitting: Internal Medicine

## 2021-09-06 ENCOUNTER — Other Ambulatory Visit: Payer: Self-pay

## 2021-09-06 ENCOUNTER — Ambulatory Visit: Payer: Medicaid Other | Attending: Internal Medicine | Admitting: Internal Medicine

## 2021-09-06 VITALS — BP 187/106 | HR 92 | Resp 16 | Wt 127.2 lb

## 2021-09-06 DIAGNOSIS — F17210 Nicotine dependence, cigarettes, uncomplicated: Secondary | ICD-10-CM

## 2021-09-06 DIAGNOSIS — F17200 Nicotine dependence, unspecified, uncomplicated: Secondary | ICD-10-CM

## 2021-09-06 DIAGNOSIS — Z1231 Encounter for screening mammogram for malignant neoplasm of breast: Secondary | ICD-10-CM

## 2021-09-06 DIAGNOSIS — I10 Essential (primary) hypertension: Secondary | ICD-10-CM | POA: Diagnosis not present

## 2021-09-06 DIAGNOSIS — R937 Abnormal findings on diagnostic imaging of other parts of musculoskeletal system: Secondary | ICD-10-CM

## 2021-09-06 DIAGNOSIS — R930 Abnormal findings on diagnostic imaging of skull and head, not elsewhere classified: Secondary | ICD-10-CM

## 2021-09-06 DIAGNOSIS — R739 Hyperglycemia, unspecified: Secondary | ICD-10-CM

## 2021-09-06 DIAGNOSIS — Z23 Encounter for immunization: Secondary | ICD-10-CM | POA: Diagnosis not present

## 2021-09-06 MED ORDER — AMLODIPINE BESYLATE 5 MG PO TABS: 5.0000 mg | ORAL_TABLET | Freq: Every day | ORAL | 1 refills | Status: DC

## 2021-09-06 NOTE — Progress Notes (Signed)
Patient ID: Debra Schmidt, female    DOB: 06-05-1961  MRN: [id number]  CC: Hypertension   Subjective: Debra Schmidt is a 60 y.o. female who presents for chronic ds management Her concerns today include:  Patient with history of tob dep, , incomplete paraplegia, L1-L2 discitis with osteomyelitis, intra-spinal abscess s/p laminectomy L1-L2, chronic hep C (cured as of 02/2021), neurogenic bladder, osteomyelitis of right clavicle, acute on chronic pancreatitis/pancreatic divisum  My first and only visit with this patient was in August of last year as a telephone visit for post hospital follow-up.   Since that time she has been following mainly with PMR getting injections around the neck to help with pain.   Her blood pressure has been noted to be elevated on several visits with Dr. Berline Chough who recommended that she follow-up with me for blood pressure management.  She is currently not on any steroids.   Patient gives history of being diagnosed with hypertension about 15 years ago and was on medication at that time.   And noted on several of her chemistries that blood sugar has been elevated.  Chemistry done in April of this year revealed a blood sugar of 207.  Hep C: She has been successfully treated for hepatitis C by infectious disease.    Tob dep: She endorses cigarette smoking but not as much as before.  5-8 cig/day.  Not ready to quit.  When I last spoke with her in August of last year, I had reordered MRI of the head to follow-up on brain lesions that were seen on previous studies during her hospitalization.  She still had these lesions and I had referred her to the neurologist.  Patient states she was called but decided not to go because she was afraid and felt she was not having any problems.  Patient Active Problem List   Diagnosis Date Noted   Benign torticollis 08/27/2021   Myofascial pain dysfunction syndrome 06/18/2021   Chronic pain syndrome 09/13/2020   Multiple lesions on CT  of brain and spine 06/23/2020   Chronic pancreatitis (HCC) 06/23/2020   Epidural abscess 06/22/2020   Muscle spasms of both lower extremities 06/06/2020   Abnormal finding on MRI of brain 06/06/2020   Chronic hepatitis C without hepatic coma (HCC) 06/06/2020   Labile blood pressure    Neuropathic pain    History of hypertension    Incomplete paraplegia (HCC) 05/09/2020   Neurogenic bladder 05/09/2020   Neurogenic bowel 05/09/2020   Septic arthritis of right acromioclavicular joint (HCC) 04/24/2020   Osteomyelitis of clavicle (HCC) 04/24/2020   Cigarette smoker 04/24/2020   Status post surgery 04/23/2020     Current Outpatient Medications on File Prior to Visit  Medication Sig Dispense Refill   acetaminophen (TYLENOL) 325 MG tablet Take 2 tablets (650 mg total) by mouth 3 (three) times daily.     baclofen (LIORESAL) 10 MG tablet Take 2 tablets (20 mg total) by mouth 4 (four) times daily. For spasticity 240 each 5   calcium citrate (CALCITRATE - DOSED IN MG ELEMENTAL CALCIUM) 950 (200 Ca) MG tablet Take 1 tablet (200 mg of elemental calcium total) by mouth daily.     chlorhexidine (PERIDEX) 0.12 % solution SMARTSIG:By Mouth     docusate sodium (COLACE) 100 MG capsule Take 2 capsules (200 mg total) by mouth daily. (Patient not taking: Reported on 09/06/2021) 60 capsule 3   HYDROcodone-acetaminophen (NORCO/VICODIN) 5-325 MG tablet Take 1 tablet by mouth 4 (four) times daily.  lidocaine (LIDODERM) 5 % PLACE 3 PATCHES ONTO THE SKIN DAILY. REMOVE & DISCARD PATCH WITHIN 12 HOURS OR AS DIRECTED BY MD 90 patch 5   magnesium oxide (MAG-OX) 400 (241.3 Mg) MG tablet Take 0.5 tablets (200 mg total) by mouth 2 (two) times daily. 30 tablet 2   Multiple Vitamin (MULTIVITAMIN WITH MINERALS) TABS tablet Take 1 tablet by mouth daily. 100 tablet 1   nitrofurantoin, macrocrystal-monohydrate, (MACROBID) 100 MG capsule Take 1 capsule (100 mg total) by mouth every 12 (twelve) hours. 14 capsule 0    pantoprazole (PROTONIX) 40 MG tablet Take 1 tablet (40 mg total) by mouth at bedtime. 30 tablet 3   polyethylene glycol (MIRALAX / GLYCOLAX) 17 g packet Take 17 g by mouth daily as needed for mild constipation. 30 each 2   potassium chloride SA (KLOR-CON) 20 MEQ tablet Take 2 tablets (40 mEq total) by mouth daily. 60 tablet 0   No current facility-administered medications on file prior to visit.    Allergies  Allergen Reactions   Adhesive [Tape]    Penicillins Hives    Tolerated Zosyn and Cefazolin 04/2020    Social History   Socioeconomic History   Marital status: Media planner    Spouse name: Not on file   Number of children: Not on file   Years of education: Not on file   Highest education level: Not on file  Occupational History   Not on file  Tobacco Use   Smoking status: Every Day    Packs/day: 0.30    Types: Cigarettes   Smokeless tobacco: Never  Vaping Use   Vaping Use: Never used  Substance and Sexual Activity   Alcohol use: Not Currently   Drug use: Not Currently   Sexual activity: Not on file  Other Topics Concern   Not on file  Social History Narrative   Not on file   Social Determinants of Health   Financial Resource Strain: Not on file  Food Insecurity: Not on file  Transportation Needs: Not on file  Physical Activity: Not on file  Stress: Not on file  Social Connections: Not on file  Intimate Partner Violence: Not on file    Family History  Problem Relation Age of Onset   Cancer Mother    Cancer Father     Past Surgical History:  Procedure Laterality Date   ACROMIO-CLAVICULAR JOINT REPAIR Right 04/25/2020   Procedure: ACROMIO-CLAVICULAR JOINT REPAIR;  Surgeon: Sheral Apley, MD;  Location: [facility] OR;  Service: Orthopedics;  Laterality: Right;   I & D EXTREMITY Right 04/25/2020   Procedure: Open debridement R AC joint; Distal clavicle repair;  Surgeon: Sheral Apley, MD;  Location: [facility] OR;  Service: Orthopedics;  Laterality: Right;    LUMBAR LAMINECTOMY/DECOMPRESSION MICRODISCECTOMY N/A 04/23/2020   Procedure: Lumbar One-Two LAMINECTOMY for Epidural Abscess Evacuation;  Surgeon: Coletta Memos, MD;  Location: [facility] OR;  Service: Neurosurgery;  Laterality: N/A;   RESECTION DISTAL CLAVICAL Right 04/25/2020   Procedure: RESECTION DISTAL CLAVICAL;  Surgeon: Sheral Apley, MD;  Location: [facility] OR;  Service: Orthopedics;  Laterality: Right;    ROS: Review of Systems Negative except as stated above  PHYSICAL EXAM: BP (!) 187/106   Pulse 92   Resp 16   Wt 127 lb 3.2 oz (57.7 kg)   SpO2 96%   BMI 21.83 kg/m   Physical Exam Repeat blood pressure 110/100 General appearance - alert, well appearing, older Caucasian female and in no distress Mental status -patient answers questions  appropriately Eyes - pupils equal and reactive, extraocular eye movements intact Chest - clear to auscultation, no wheezes, rales or rhonchi, symmetric air entry Heart - normal rate, regular rhythm, normal S1, S2, no murmurs, rubs, clicks or gallops Extremities - peripheral pulses normal, no pedal edema, no clubbing or cyanosis   CMP Latest Ref Rng & Units 09/06/2021 02/20/2021 06/22/2020  Glucose 70 - 99 mg/dL 235(T) 614(E) 315(Q)  BUN 8 - 27 mg/dL 8 7 10   Creatinine 0.57 - 1.00 mg/dL 0.08 6.76  Sodium 134 - 144 mmol/L 144 137 137  Potassium 3.5 - 5.2 mmol/L 4.0 4.3 4.4  Chloride 96 - 106 mmol/L 101 99 100  CO2 20 - 29 mmol/L 29 28 27   Calcium 8.7 - 10.3 mg/dL 1.95 9.5 10.5(H)  Total Protein 6.0 - 8.5 g/dL 7.5 7.3 )  Total Bilirubin 0.0 - 1.2 mg/dL 0.5 0.8 0.3  Alkaline Phos 44 - 121 IU/L 124(H) - -  AST 0 - 40 IU/L 19 13 27   ALT 0 - 32 IU/L 15 7 20    Lipid Panel     Component Value Date/Time   CHOL 148 09/06/2021 1606   TRIG 59 09/06/2021 1606   HDL 54 09/06/2021 1606   CHOLHDL 2.7 09/06/2021 1606   CHOLHDL 7.5 04/29/2020 1722   VLDL 38 04/29/2020 1722   LDLCALC 82 09/06/2021 1606    CBC    Component Value Date/Time    WBC 5.6 09/06/2021 1606   WBC 7.6 06/22/2020 0955   RBC 4.53 09/06/2021 1606   RBC 5.20 (H) 06/22/2020 0955   HGB 13.8 09/06/2021 1606   HCT 40.0 09/06/2021 1606   PLT 224 09/06/2021 1606   MCV 88 09/06/2021 1606   MCH 30.5 09/06/2021 1606   MCH 27.9 06/22/2020 0955   MCHC 34.5 09/06/2021 1606   MCHC 32.3 06/22/2020 0955   RDW 13.3 09/06/2021 1606   LYMPHSABS 1,794 06/22/2020 0955   MONOABS 0.4 05/29/2020 0346   EOSABS 350 06/22/2020 0955   BASOSABS 91 06/22/2020 0955    ASSESSMENT AND PLAN: 1. Essential hypertension DASH diet discussed and encouraged. Advised of blood pressure goal of 130/80 or lower. Patient with definite hypertension.  We will start her on amlodipine.  She will follow-up in a few weeks for repeat blood pressure check and further titration of medication as needed. - amLODipine (NORVASC) 5 MG tablet; Take 1 tablet (5 mg total) by mouth daily.  Dispense: 90 tablet; Refill: 1 - CBC - Comprehensive metabolic panel - Lipid panel  2. Tobacco dependence Advised to quit.  Patient not ready to give a trial of quitting.  3. Hyperglycemia We will check chemistry and an A1c to screen for diabetes - Hemoglobin A1c  4. Multiple lesions on CT of brain and spine Advised patient that it would have been best for her to follow through with this and seeing the neurologist to make sure that she does not have something like MS.  Patient not interested in referral at this time.  5. Need for vaccination against Streptococcus pneumoniae - Pneumococcal conjugate vaccine 20-valent  6. Encounter for screening mammogram for malignant neoplasm of breast - MM Digital Screening; Future  7. Need for immunization against influenza - Flu Vaccine QUAD 75mo+IM (Fluarix, Fluzone & Alfiuria Quad PF)     Patient was given the opportunity to ask questions.  Patient verbalized understanding of the plan and was able to repeat key elements of the plan.   Orders Placed This Encounter  Procedures   MM Digital Screening   Pneumococcal conjugate vaccine 20-valent   Flu Vaccine QUAD 34mo+IM (Fluarix, Fluzone & Alfiuria Quad PF)   CBC   Comprehensive metabolic panel   Lipid panel   Hemoglobin A1c     Requested Prescriptions   Signed Prescriptions Disp Refills   amLODipine (NORVASC) 5 MG tablet 90 tablet 1    Sig: Take 1 tablet (5 mg total) by mouth daily.    Return in about 2 weeks (around 09/20/2021) for PAP and recheck BP.  Jonah Blue, MD, FACP

## 2021-09-07 ENCOUNTER — Encounter: Payer: Self-pay | Admitting: Internal Medicine

## 2021-09-07 LAB — CBC
Hematocrit: 40 % (ref 34.0–46.6)
Hemoglobin: 13.8 g/dL (ref 11.1–15.9)
MCH: 30.5 pg (ref 26.6–33.0)
MCHC: 34.5 g/dL (ref 31.5–35.7)
MCV: 88 fL (ref 79–97)
Platelets: 224 10*3/uL (ref 150–450)
RBC: 4.53 x10E6/uL (ref 3.77–5.28)
RDW: 13.3 % (ref 11.7–15.4)
WBC: 5.6 10*3/uL (ref 3.4–10.8)

## 2021-09-07 LAB — COMPREHENSIVE METABOLIC PANEL
ALT: 15 IU/L (ref 0–32)
AST: 19 IU/L (ref 0–40)
Albumin/Globulin Ratio: 1.3 (ref 1.2–2.2)
Albumin: 4.2 g/dL (ref 3.8–4.9)
Alkaline Phosphatase: 124 IU/L — ABNORMAL HIGH (ref 44–121)
BUN/Creatinine Ratio: 12 (ref 12–28)
BUN: 8 mg/dL (ref 8–27)
Bilirubin Total: 0.5 mg/dL (ref 0.0–1.2)
CO2: 29 mmol/L (ref 20–29)
Calcium: 10.2 mg/dL (ref 8.7–10.3)
Chloride: 101 mmol/L (ref 96–106)
Creatinine, Ser: 0.68 mg/dL (ref 0.57–1.00)
Globulin, Total: 3.3 g/dL (ref 1.5–4.5)
Glucose: 113 mg/dL — ABNORMAL HIGH (ref 70–99)
Potassium: 4 mmol/L (ref 3.5–5.2)
Sodium: 144 mmol/L (ref 134–144)
Total Protein: 7.5 g/dL (ref 6.0–8.5)
eGFR: 100 mL/min/{1.73_m2} (ref >59)

## 2021-09-07 LAB — LIPID PANEL
Chol/HDL Ratio: 2.7 ratio (ref 0.0–4.4)
Cholesterol, Total: 148 mg/dL (ref 100–199)
HDL: 54 mg/dL (ref >39)
LDL Chol Calc (NIH): 82 mg/dL (ref 0–99)
Triglycerides: 59 mg/dL (ref 0–149)
VLDL Cholesterol Cal: 12 mg/dL (ref 5–40)

## 2021-09-07 LAB — HEMOGLOBIN A1C
Est. average glucose Bld gHb Est-mCnc: 134 mg/dL
Hgb A1c MFr Bld: 6.3 % — ABNORMAL HIGH (ref 4.8–5.6)

## 2021-09-07 NOTE — Progress Notes (Signed)
Let patient know that her blood cell counts including red blood cells, white blood cells and platelet counts are normal.  Her kidney and liver function tests are normal.  Cholesterol level normal.  She is in the range for prediabetes.  Healthy eating habits and trying to move as much as she can will help to prevent progression to full diabetes.  Let me know if she would like to be referred to the nutritionist for dietary counseling.

## 2021-09-10 ENCOUNTER — Telehealth: Payer: Self-pay

## 2021-09-10 NOTE — Telephone Encounter (Signed)
Contacted pt to go over lab results pt is aware and doesn't have any questions or concerns 

## 2021-09-15 ENCOUNTER — Other Ambulatory Visit: Payer: Self-pay | Admitting: Physical Medicine and Rehabilitation

## 2021-09-17 ENCOUNTER — Encounter: Payer: Self-pay | Admitting: Urology

## 2021-09-17 ENCOUNTER — Other Ambulatory Visit: Payer: Self-pay

## 2021-09-17 ENCOUNTER — Ambulatory Visit: Payer: Medicaid Other | Admitting: Physical Medicine and Rehabilitation

## 2021-09-17 ENCOUNTER — Ambulatory Visit (INDEPENDENT_AMBULATORY_CARE_PROVIDER_SITE_OTHER): Payer: Medicaid Other | Admitting: Urology

## 2021-09-17 VITALS — BP 130/80 | HR 74 | Ht 64.0 in | Wt 122.0 lb

## 2021-09-17 DIAGNOSIS — N3946 Mixed incontinence: Secondary | ICD-10-CM

## 2021-09-17 MED ORDER — MIRABEGRON ER 50 MG PO TB24: 50.0000 mg | ORAL_TABLET | Freq: Every day | ORAL | 11 refills | Status: DC

## 2021-09-17 NOTE — Progress Notes (Signed)
09/17/2021 9:10 AM   Debra Schmidt [birth year] [id number]  Referring provider: Marcine Matar, MD [address] [place],  [place] [address]  Chief Complaint  Patient presents with   Other    HPI: I was consulted to assess the patient who was an incomplete paraplegic L1-L2.  She has had osteomyelitis as well as an intraspinal abscess with laminectomy.  Has been on Lidoderm patches   The patient describes performing clean intermittent catheterization after her back surgery and spinal event a year ago for 2 months.  She was also having a lot of bowel dysfunction.   Now she has urge incontinence especially holds it too long in public.  Otherwise she will wear 2 pads a day and does reasonably well with lower volume leakage.  No stress incontinence.  1 or 2 nights a week she has bedwetting.  Her flow was good and she feels empty   As prior to event last year she also had a stroke.  She might have some decrease sensation around the rectum but is minimal.  Bowel movements otherwise normal but tends towards constipation     Small grade 2 cystocele in the higher vaginal vault.  Small distal asymptomatic rectocele.  Well supported bladder neck and no stress incontinence  Patient primarily has urge incontinence and bedwetting secondary to neurogenic bladder.  She has not had an upper tract x-ray since last April.    Today Frequency stable.  Incontinence stable.  Last culture positive.  Ultrasound normal On urodynamics patient had not voided and was catheterized for 200 mL.  Maximum bladder capacity was 250 mL.  She increased bladder sensation.  She had bladder overactivity reaching a pressure of 40 cm of water associated with urgency and moderate leakage.  She did have mild stress incontinence at capacity.  At 250 mL she had mild leakage with leak point pressure of 63 cm of water.  During voluntary voiding she voided 148 mL with a maximum flow of approximately 8 mils per second.  Maximum  voiding pressure was 54 cm of water.  Residual 71 mL.  EMG activity within normal limits.  It was a noncontrast study due to iodine shortage patient did states she can leak a little bit with coughing when her bladder is full.  Happens occasionally at home.  The details of the urodynamics are signed and dictated     PMH: No past medical history on file.  Surgical History: Past Surgical History:  Procedure Laterality Date   ACROMIO-CLAVICULAR JOINT REPAIR Right 04/25/2020   Procedure: ACROMIO-CLAVICULAR JOINT REPAIR;  Surgeon: Sheral Apley, MD;  Location: [facility] OR;  Service: Orthopedics;  Laterality: Right;   I & D EXTREMITY Right 04/25/2020   Procedure: Open debridement R AC joint; Distal clavicle repair;  Surgeon: Sheral Apley, MD;  Location: [facility] OR;  Service: Orthopedics;  Laterality: Right;   LUMBAR LAMINECTOMY/DECOMPRESSION MICRODISCECTOMY N/A 04/23/2020   Procedure: Lumbar One-Two LAMINECTOMY for Epidural Abscess Evacuation;  Surgeon: Coletta Memos, MD;  Location: [facility] OR;  Service: Neurosurgery;  Laterality: N/A;   RESECTION DISTAL CLAVICAL Right 04/25/2020   Procedure: RESECTION DISTAL CLAVICAL;  Surgeon: Sheral Apley, MD;  Location: [facility] OR;  Service: Orthopedics;  Laterality: Right;    Home Medications:  Allergies as of 09/17/2021       Reactions   Adhesive [tape]    Penicillins Hives   Tolerated Zosyn and Cefazolin 04/2020        Medication List  Accurate as of September 17, 2021  9:10 AM. If you have any questions, ask your nurse or doctor.          acetaminophen 325 MG tablet Commonly known as: TYLENOL Take 2 tablets (650 mg total) by mouth 3 (three) times daily.   amLODipine 5 MG tablet Commonly known as: NORVASC Take 1 tablet (5 mg total) by mouth daily.   baclofen 10 MG tablet Commonly known as: LIORESAL Take 2 tablets (20 mg total) by mouth 4 (four) times daily. For spasticity   calcium citrate 950 (200 Ca) MG tablet Commonly known as:  CALCITRATE - dosed in mg elemental calcium Take 1 tablet (200 mg of elemental calcium total) by mouth daily.   chlorhexidine 0.12 % solution Commonly known as: PERIDEX SMARTSIG:By Mouth   docusate sodium 100 MG capsule Commonly known as: COLACE Take 2 capsules (200 mg total) by mouth daily.   HYDROcodone-acetaminophen 5-325 MG tablet Commonly known as: NORCO/VICODIN Take 1 tablet by mouth 4 (four) times daily.   lidocaine 5 % Commonly known as: LIDODERM PLACE 3 PATCHES ONTO THE SKIN DAILY. REMOVE & DISCARD PATCH WITHIN 12 HOURS OR AS DIRECTED BY MD   magnesium oxide 400 (241.3 Mg) MG tablet Commonly known as: MAG-OX Take 0.5 tablets (200 mg total) by mouth 2 (two) times daily.   multivitamin with minerals Tabs tablet Take 1 tablet by mouth daily.   nitrofurantoin (macrocrystal-monohydrate) 100 MG capsule Commonly known as: MACROBID Take 1 capsule (100 mg total) by mouth every 12 (twelve) hours.   pantoprazole 40 MG tablet Commonly known as: PROTONIX Take 1 tablet (40 mg total) by mouth at bedtime.   polyethylene glycol 17 g packet Commonly known as: MIRALAX / GLYCOLAX Take 17 g by mouth daily as needed for mild constipation.   potassium chloride SA 20 MEQ tablet Commonly known as: KLOR-CON Take 2 tablets (40 mEq total) by mouth daily.        Allergies:  Allergies  Allergen Reactions   Adhesive [Tape]    Penicillins Hives    Tolerated Zosyn and Cefazolin 04/2020    Family History: Family History  Problem Relation Age of Onset   Cancer Mother    Cancer Father     Social History:  reports that she has been smoking cigarettes. She has been smoking an average of .3 packs per day. She has never used smokeless tobacco. She reports that she does not currently use alcohol. She reports that she does not currently use drugs.  ROS:                                        Physical Exam: BP 130/80   Pulse 74   Ht 5\' 4"  (1.626 m)   Wt 55.3  kg   BMI 20.94 kg/m   Constitutional:  Alert and oriented, No acute distress.   Laboratory Data: Lab Results  Component Value Date   WBC 5.6 09/06/2021   HGB 13.8 09/06/2021   HCT 40.0 09/06/2021   MCV 88 09/06/2021   PLT 224 09/06/2021    Lab Results  Component Value Date   CREATININE 0.68 09/06/2021    No results found for: PSA  No results found for: TESTOSTERONE  Lab Results  Component Value Date   HGBA1C 6.3 (H) 09/06/2021    Urinalysis    Component Value Date/Time   COLORURINE YELLOW 05/17/2020 1542  APPEARANCEUR Cloudy (A) 04/02/2021 1423   LABSPEC 1.015 05/17/2020 1542   PHURINE 6.0 05/17/2020 1542   GLUCOSEU Negative 04/02/2021 1423   HGBUR NEGATIVE 05/17/2020 1542   BILIRUBINUR Negative 04/02/2021 1423   KETONESUR NEGATIVE 05/17/2020 1542   PROTEINUR Negative 04/02/2021 1423   PROTEINUR NEGATIVE 05/17/2020 1542   NITRITE Positive (A) 04/02/2021 1423   NITRITE NEGATIVE 05/17/2020 1542   LEUKOCYTESUR Negative 04/02/2021 1423   LEUKOCYTESUR NEGATIVE 05/17/2020 1542    Pertinent Imaging:   Assessment & Plan:  Approximately 90% of patients voiding dysfunction is an overactive bladder and likely a neurogenic bladder.  Bladder pressures are normal.  She appears to have a good voiding phase and there is no obvious pelvic floor or external sphincter dyssynergia.  Refractory treatments are options but likely she would be at higher risk of retention with Botox.  Reassess with a postvoid residual on Myrbetriq 50 mg samples and prescription in 5 to 6 weeks.  I held off on cystoscopy for now.  High risk of slower flow with overactive bladder medications discussed  There are no diagnoses linked to this encounter.  No follow-ups on file.  Martina Sinner, MD  [facility] [address] [place], [place] [address] [phone]

## 2021-09-24 ENCOUNTER — Other Ambulatory Visit: Payer: Self-pay | Admitting: Internal Medicine

## 2021-09-24 DIAGNOSIS — Z1231 Encounter for screening mammogram for malignant neoplasm of breast: Secondary | ICD-10-CM

## 2021-09-24 NOTE — Telephone Encounter (Signed)
error 

## 2021-10-12 ENCOUNTER — Encounter: Payer: Medicaid Other | Admitting: Physical Medicine and Rehabilitation

## 2021-10-15 ENCOUNTER — Telehealth: Payer: Self-pay

## 2021-10-15 NOTE — Telephone Encounter (Signed)
Pt calls triage line and states that her insurance will not cover Myrbetriq, she questions if an alternative medication can be sent in. Please advise.

## 2021-10-17 ENCOUNTER — Encounter: Payer: Medicaid Other | Admitting: Physical Medicine and Rehabilitation

## 2021-10-17 MED ORDER — OXYBUTYNIN CHLORIDE ER 10 MG PO TB24: 10.0000 mg | ORAL_TABLET | Freq: Every day | ORAL | 11 refills | Status: DC

## 2021-10-17 NOTE — Telephone Encounter (Signed)
As per dr. Sherron Monday Oxybutynin ER 10 mg 30x11 . Sw pt. Pt aware sent to pharmacy.

## 2021-10-18 ENCOUNTER — Ambulatory Visit: Payer: Medicaid Other | Attending: Internal Medicine | Admitting: Internal Medicine

## 2021-10-18 ENCOUNTER — Other Ambulatory Visit: Payer: Self-pay

## 2021-10-18 VITALS — BP 168/96 | HR 123 | Ht 64.0 in | Wt 115.6 lb

## 2021-10-18 DIAGNOSIS — R7303 Prediabetes: Secondary | ICD-10-CM | POA: Diagnosis not present

## 2021-10-18 DIAGNOSIS — R634 Abnormal weight loss: Secondary | ICD-10-CM

## 2021-10-18 DIAGNOSIS — Z1211 Encounter for screening for malignant neoplasm of colon: Secondary | ICD-10-CM | POA: Diagnosis not present

## 2021-10-18 DIAGNOSIS — I10 Essential (primary) hypertension: Secondary | ICD-10-CM | POA: Diagnosis not present

## 2021-10-18 DIAGNOSIS — L659 Nonscarring hair loss, unspecified: Secondary | ICD-10-CM | POA: Diagnosis not present

## 2021-10-18 DIAGNOSIS — R000 Tachycardia, unspecified: Secondary | ICD-10-CM | POA: Diagnosis not present

## 2021-10-18 MED ORDER — AMLODIPINE BESYLATE 10 MG PO TABS: 10.0000 mg | ORAL_TABLET | Freq: Every day | ORAL | 1 refills | Status: DC

## 2021-10-18 NOTE — Patient Instructions (Signed)
Increase the amlodipine to 10 mg daily.  I have sent an updated prescription to your pharmacy.  Prediabetes Eating Plan Prediabetes is a condition that causes blood sugar (glucose) levels to be higher than normal. This increases the risk for developing type 2 diabetes (type 2 diabetes mellitus). Working with a health care provider or nutrition specialist (dietitian) to make diet and lifestyle changes can help prevent the onset of diabetes. These changes may help you: Control your blood glucose levels. Improve your cholesterol levels. Manage your blood pressure. What are tips for following this plan? Reading food labels Read food labels to check the amount of fat, salt (sodium), and sugar in prepackaged foods. Avoid foods that have: Saturated fats. Trans fats. Added sugars. Avoid foods that have more than 300 milligrams (mg) of sodium per serving. Limit your sodium intake to less than 2,300 mg each day. Shopping Avoid buying pre-made and processed foods. Avoid buying drinks with added sugar. Cooking Cook with olive oil. Do not use butter, lard, or ghee. Bake, broil, grill, steam, or boil foods. Avoid frying. Meal planning  Work with your dietitian to create an eating plan that is right for you. This may include tracking how many calories you take in each day. Use a food diary, notebook, or mobile application to track what you eat at each meal. Consider following a Mediterranean diet. This includes: Eating several servings of fresh fruits and vegetables each day. Eating fish at least twice a week. Eating one serving each day of whole grains, beans, nuts, and seeds. Using olive oil instead of other fats. Limiting alcohol. Limiting red meat. Using nonfat or low-fat dairy products. Consider following a plant-based diet. This includes dietary choices that focus on eating mostly vegetables and fruit, grains, beans, nuts, and seeds. If you have high blood pressure, you may need to limit your  sodium intake or follow a diet such as the DASH (Dietary Approaches to Stop Hypertension) eating plan. The DASH diet aims to lower high blood pressure. Lifestyle Set weight loss goals with help from your health care team. It is recommended that most people with prediabetes lose 7% of their body weight. Exercise for at least 30 minutes 5 or more days a week. Attend a support group or seek support from a mental health counselor. Take over-the-counter and prescription medicines only as told by your health care provider. What foods are recommended? Fruits Berries. Bananas. Apples. Oranges. Grapes. Papaya. Mango. Pomegranate. Kiwi. Grapefruit. Cherries. Vegetables Lettuce. Spinach. Peas. Beets. Cauliflower. Cabbage. Broccoli. Carrots. Tomatoes. Squash. Eggplant. Herbs. Peppers. Onions. Cucumbers. Brussels sprouts. Grains Whole grains, such as whole-wheat or whole-grain breads, crackers, cereals, and pasta. Unsweetened oatmeal. Bulgur. Barley. Quinoa. Brown rice. Corn or whole-wheat flour tortillas or taco shells. Meats and other proteins Seafood. Poultry without skin. Lean cuts of pork and beef. Tofu. Eggs. Nuts. Beans. Dairy Low-fat or fat-free dairy products, such as yogurt, cottage cheese, and cheese. Beverages Water. Tea. Coffee. Sugar-free or diet soda. Seltzer water. Low-fat or nonfat milk. Milk alternatives, such as soy or almond milk. Fats and oils Olive oil. Canola oil. Sunflower oil. Grapeseed oil. Avocado. Walnuts. Sweets and desserts Sugar-free or low-fat pudding. Sugar-free or low-fat ice cream and other frozen treats. Seasonings and condiments Herbs. Sodium-free spices. Mustard. Relish. Low-salt, low-sugar ketchup. Low-salt, low-sugar barbecue sauce. Low-fat or fat-free mayonnaise. The items listed above may not be a complete list of recommended foods and beverages. Contact a dietitian for more information. What foods are not recommended? Fruits Fruits canned with  syrup. Vegetables Canned vegetables. Frozen vegetables with butter or cream sauce. Grains Refined white flour and flour products, such as bread, pasta, snack foods, and cereals. Meats and other proteins Fatty cuts of meat. Poultry with skin. Breaded or fried meat. Processed meats. Dairy Full-fat yogurt, cheese, or milk. Beverages Sweetened drinks, such as iced tea and soda. Fats and oils Butter. Lard. Ghee. Sweets and desserts Baked goods, such as cake, cupcakes, pastries, cookies, and cheesecake. Seasonings and condiments Spice mixes with added salt. Ketchup. Barbecue sauce. Mayonnaise. The items listed above may not be a complete list of foods and beverages that are not recommended. Contact a dietitian for more information. Where to find more information American Diabetes Association: www.diabetes.org Summary You may need to make diet and lifestyle changes to help prevent the onset of diabetes. These changes can help you control blood sugar, improve cholesterol levels, and manage blood pressure. Set weight loss goals with help from your health care team. It is recommended that most people with prediabetes lose 7% of their body weight. Consider following a Mediterranean diet. This includes eating plenty of fresh fruits and vegetables, whole grains, beans, nuts, seeds, fish, and low-fat dairy, and using olive oil instead of other fats. This information is not intended to replace advice given to you by your health care provider. Make sure you discuss any questions you have with your health care provider. Document Revised: 01/27/2020 Document Reviewed: 01/27/2020 Elsevier Patient Education  2022 [employer].

## 2021-10-18 NOTE — Progress Notes (Signed)
Patient ID: Debra Schmidt, female    DOB: Sep 23, 1961  MRN: [id number]  CC: Hypertension   Subjective: Debra Schmidt is a 60 y.o. female who presents for f/u BP Her concerns today include:  Patient with history of tob dep, preDM,  incomplete paraplegia, L1-L2 discitis with osteomyelitis, intra-spinal abscess s/p laminectomy L1-L2, chronic hep C (cured as of 02/2021), neurogenic bladder, osteomyelitis of right clavicle, acute on chronic pancreatitis/pancreatic divisum   HYPERTENSION Currently taking: see medication list. Started on Norvasc 5 mg on last visit 09/06/21 Med Adherence: [x]  Yes    []  No Medication side effects: []  Yes    [x]  No Adherence with salt restriction: [x]  Yes    []  No Home Monitoring?: []  Yes    [x]  No Monitoring Frequency:  Home BP results range:  SOB? []  Yes    [x]  No Chest Pain?: []  Yes    [x]  No Leg swelling?: []  Yes    [x]  No Headaches?: []  Yes    [x]  No Dizziness? []  Yes    [x]  No Comments: Pulse rate noted to be in the 120s today when vitals were checked.  Pt states she just smoked a cigarette before coming in the building  Loss 8 lbs since I last saw her 6 wks ago Reports having a viral illness last wk where she did not eat as much.  She felt cold and "blah" with some cough.  COVID home test was negative.  Feeling better and appetite has return.  No flare of pancreatitis in over 1 yr Recent labs revealed preDM.  She has cut back on sugary stuff   Tob dep:  has cut back but not ready to quit yet.  HM:  MMG scheduled for 10/30/2021.  Wants cologuard for colon CA screening. Had 1st shingles shot at [facility] recently.  Patient Active Problem List   Diagnosis Date Noted   Benign torticollis 08/27/2021   Myofascial pain dysfunction syndrome 06/18/2021   Chronic pain syndrome 09/13/2020   Multiple lesions on CT of brain and spine 06/23/2020   Chronic pancreatitis ([place]) 06/23/2020   Epidural abscess 06/22/2020   Muscle spasms of both  lower extremities 06/06/2020   Abnormal finding on MRI of brain 06/06/2020   Chronic hepatitis C without hepatic coma ([place]) 06/06/2020   Labile blood pressure    Neuropathic pain    History of hypertension    Incomplete paraplegia ([place]) 05/09/2020   Neurogenic bladder 05/09/2020   Neurogenic bowel 05/09/2020   Cigarette smoker 04/24/2020   Status post surgery 04/23/2020     Current Outpatient Medications on File Prior to Visit  Medication Sig Dispense Refill   acetaminophen (TYLENOL) 325 MG tablet Take 2 tablets (650 mg total) by mouth 3 (three) times daily.     baclofen (LIORESAL) 10 MG tablet TAKE 1.5 TABLETS (15 MG TOTAL) BY MOUTH 4 (FOUR) TIMES DAILY. FOR SPASTICITY 180 tablet 5   calcium citrate (CALCITRATE - DOSED IN MG ELEMENTAL CALCIUM) 950 (200 Ca) MG tablet Take 1 tablet (200 mg of elemental calcium total) by mouth daily.     chlorhexidine (PERIDEX) 0.12 % solution SMARTSIG:By Mouth     docusate sodium (COLACE) 100 MG capsule Take 2 capsules (200 mg total) by mouth daily. 60 capsule 3   magnesium oxide (MAG-OX) 400 (241.3 Mg) MG tablet Take 0.5 tablets (200 mg total) by mouth 2 (two) times daily. 30 tablet 2   Multiple Vitamin (MULTIVITAMIN WITH MINERALS) TABS tablet Take 1 tablet  by mouth daily. 100 tablet 1   nitrofurantoin, macrocrystal-monohydrate, (MACROBID) 100 MG capsule Take 1 capsule (100 mg total) by mouth every 12 (twelve) hours. 14 capsule 0   oxybutynin (DITROPAN-XL) 10 MG 24 hr tablet Take 1 tablet (10 mg total) by mouth daily. 30 tablet 11   pantoprazole (PROTONIX) 40 MG tablet Take 1 tablet (40 mg total) by mouth at bedtime. 30 tablet 3   polyethylene glycol (MIRALAX / GLYCOLAX) 17 g packet Take 17 g by mouth daily as needed for mild constipation. 30 each 2   potassium chloride SA (KLOR-CON) 20 MEQ tablet Take 2 tablets (40 mEq total) by mouth daily. 60 tablet 0   No current facility-administered medications on file prior to visit.    Allergies  Allergen  Reactions   Adhesive [Tape]    Penicillins Hives    Tolerated Zosyn and Cefazolin 04/2020    Social History   Socioeconomic History   Marital status: Media planner    Spouse name: Not on file   Number of children: Not on file   Years of education: Not on file   Highest education level: Not on file  Occupational History   Not on file  Tobacco Use   Smoking status: Every Day    Packs/day: 0.30    Types: Cigarettes   Smokeless tobacco: Never  Vaping Use   Vaping Use: Never used  Substance and Sexual Activity   Alcohol use: Not Currently   Drug use: Not Currently   Sexual activity: Not on file  Other Topics Concern   Not on file  Social History Narrative   Not on file   Social Determinants of Health   Financial Resource Strain: Not on file  Food Insecurity: Not on file  Transportation Needs: Not on file  Physical Activity: Not on file  Stress: Not on file  Social Connections: Not on file  Intimate Partner Violence: Not on file    Family History  Problem Relation Age of Onset   Cancer Mother    Cancer Father     Past Surgical History:  Procedure Laterality Date   ACROMIO-CLAVICULAR JOINT REPAIR Right 04/25/2020   Procedure: ACROMIO-CLAVICULAR JOINT REPAIR;  Surgeon: Sheral Apley, MD;  Location: [facility] OR;  Service: Orthopedics;  Laterality: Right;   I & D EXTREMITY Right 04/25/2020   Procedure: Open debridement R AC joint; Distal clavicle repair;  Surgeon: Sheral Apley, MD;  Location: [facility] OR;  Service: Orthopedics;  Laterality: Right;   LUMBAR LAMINECTOMY/DECOMPRESSION MICRODISCECTOMY N/A 04/23/2020   Procedure: Lumbar One-Two LAMINECTOMY for Epidural Abscess Evacuation;  Surgeon: Coletta Memos, MD;  Location: [facility] OR;  Service: Neurosurgery;  Laterality: N/A;   RESECTION DISTAL CLAVICAL Right 04/25/2020   Procedure: RESECTION DISTAL CLAVICAL;  Surgeon: Sheral Apley, MD;  Location: [facility] OR;  Service: Orthopedics;  Laterality: Right;    ROS: Review of  Systems Negative except as stated above  PHYSICAL EXAM: BP (!) 168/96   Pulse (!) 123   Ht 5\' 4"  (1.626 m)   Wt 115 lb 9.6 oz (52.4 kg)   SpO2 95%   BMI 19.84 kg/m   Wt Readings from Last 3 Encounters:  10/18/21 115 lb 9.6 oz (52.4 kg)  09/17/21 122 lb (55.3 kg)  09/06/21 127 lb 3.2 oz (57.7 kg)   Sitting: BP 155/91 pulse of 116 Standing: BP 159/94, pulse of 118 Physical Exam  General appearance - alert, well appearing, and in no distress Mental status - normal mood, behavior, speech,  dress, motor activity, and thought processes Mouth -oral mucosa is dry. Chest - clear to auscultation, no wheezes, rales or rhonchi, symmetric air entry Heart -she has a regular tachycardia Skin -noted to have a 3 cm bald spot at the top of the head. (Patient states she has had this since she had COVID last year).   CMP Latest Ref Rng & Units 09/06/2021 02/20/2021 06/22/2020  Glucose 70 - 99 mg/dL 113(H) 207(H) 153(H)  BUN 8 - 27 mg/dL 8 7 10   Creatinine 0.57 - 1.00 mg/dL 0.68 0.70 0.71  Sodium 134 - 144 mmol/L 144 137 137  Potassium 3.5 - 5.2 mmol/L 4.0 4.3 4.4  Chloride 96 - 106 mmol/L 101 99 100  CO2 20 - 29 mmol/L 29 28 27   Calcium 8.7 - 10.3 mg/dL 10.2 9.5 10.5(H)  Total Protein 6.0 - 8.5 g/dL 7.5 7.3 8.7(H)  Total Bilirubin 0.0 - 1.2 mg/dL 0.5 0.8 0.3  Alkaline Phos 44 - 121 IU/L 124(H) - -  AST 0 - 40 IU/L 19 13 27   ALT 0 - 32 IU/L 15 7 20    Lipid Panel     Component Value Date/Time   CHOL 148 09/06/2021 1606   TRIG 59 09/06/2021 1606   HDL 54 09/06/2021 1606   CHOLHDL 2.7 09/06/2021 1606   CHOLHDL 7.5 04/29/2020 1722   VLDL 38 04/29/2020 1722   LDLCALC 82 09/06/2021 1606    CBC    Component Value Date/Time   WBC 5.6 09/06/2021 1606   WBC 7.6 06/22/2020 0955   RBC 4.53 09/06/2021 1606   RBC 5.20 (H) 06/22/2020 0955   HGB 13.8 09/06/2021 1606   HCT 40.0 09/06/2021 1606   PLT 224 09/06/2021 1606   MCV 88 09/06/2021 1606   MCH 30.5 09/06/2021 1606   MCH 27.9  06/22/2020 0955   MCHC 34.5 09/06/2021 1606   MCHC 32.3 06/22/2020 0955   RDW 13.3 09/06/2021 1606   LYMPHSABS 1,794 06/22/2020 0955   MONOABS 0.4 05/29/2020 0346   EOSABS 350 06/22/2020 0955   BASOSABS 91 06/22/2020 0955    ASSESSMENT AND PLAN:  1. Essential hypertension Not at goal.  I recommend increase amlodipine to 10 mg daily.  She will start taking 2 of the 5 mg until she runs out.  Follow-up with clinical pharmacist in 3 to 4 weeks for repeat blood pressure check. - amLODipine (NORVASC) 10 MG tablet; Take 1 tablet (10 mg total) by mouth daily.  Dispense: 90 tablet; Refill: 1  2. Unintended weight loss Patient attributes weight loss to decreased appetite and she had a recent viral illness.  She reports that her appetite has returned.  However given the weight loss and tachycardia, we will check thyroid function tests - 99991111 - Basic Metabolic Panel  3. Alopecia Recommend referral to dermatology.  Patient declined.  4. Tachycardia Patient looks a little dry on physical exam even though orthostatic check was negative.  Encouraged her to drink several glasses of fluid during the day to keep her self hydrated. - 99991111 - Basic Metabolic Panel  5. Prediabetes Dietary counseling given.  6. Screening for colon cancer Cologuard ordered.   Patient was given the opportunity to ask questions.  Patient verbalized understanding of the plan and was able to repeat key elements of the plan.   Orders Placed This Encounter  Procedures   99991111   Basic Metabolic Panel      Requested Prescriptions   Signed Prescriptions Disp Refills   amLODipine (NORVASC) 10 MG  tablet 90 tablet 1    Sig: Take 1 tablet (10 mg total) by mouth daily.    Return in about 4 months (around 02/16/2022) for Appt with [facility] in 3 wks for BP check.  Karle Plumber, MD, FACP

## 2021-10-19 LAB — BASIC METABOLIC PANEL
BUN/Creatinine Ratio: 18 (ref 12–28)
BUN: 13 mg/dL (ref 8–27)
CO2: 27 mmol/L (ref 20–29)
Calcium: 11.1 mg/dL — ABNORMAL HIGH (ref 8.7–10.3)
Chloride: 95 mmol/L — ABNORMAL LOW (ref 96–106)
Creatinine, Ser: 0.71 mg/dL (ref 0.57–1.00)
Glucose: 109 mg/dL — ABNORMAL HIGH (ref 70–99)
Potassium: 4.7 mmol/L (ref 3.5–5.2)
Sodium: 139 mmol/L (ref 134–144)
eGFR: 97 mL/min/{1.73_m2} (ref >59)

## 2021-10-19 LAB — TSH+T4F+T3FREE
Free T4: 1.25 ng/dL (ref 0.82–1.77)
T3, Free: 3.8 pg/mL (ref 2.0–4.4)
TSH: 0.581 u[IU]/mL (ref 0.450–4.500)

## 2021-10-20 ENCOUNTER — Other Ambulatory Visit: Payer: Self-pay | Admitting: Internal Medicine

## 2021-10-20 NOTE — Progress Notes (Signed)
Let patient know that her calcium level is significantly elevated.  I will need for her to return to the lab for additional blood test to evaluate this further.  Orders placed.  Please inquire whether she is taking any calcium supplements from over-the-counter. -Kidney function normal. Thyroid level normal.

## 2021-10-29 ENCOUNTER — Telehealth: Payer: Self-pay

## 2021-10-29 ENCOUNTER — Ambulatory Visit: Payer: Medicaid Other | Admitting: Urology

## 2021-10-29 NOTE — Telephone Encounter (Signed)
Contacted pt to go over lab results pt is aware and doesn't have any questions or concerns 

## 2021-10-30 ENCOUNTER — Ambulatory Visit: Payer: Medicaid Other

## 2021-11-28 ENCOUNTER — Ambulatory Visit: Payer: Medicaid Other

## 2021-12-06 ENCOUNTER — Ambulatory Visit: Payer: Medicaid Other

## 2021-12-10 DIAGNOSIS — Z1211 Encounter for screening for malignant neoplasm of colon: Secondary | ICD-10-CM | POA: Diagnosis not present

## 2021-12-17 ENCOUNTER — Ambulatory Visit: Payer: Medicaid Other | Admitting: Urology

## 2021-12-17 ENCOUNTER — Encounter: Payer: Self-pay | Admitting: Urology

## 2021-12-18 LAB — COLOGUARD: COLOGUARD: NEGATIVE

## 2022-01-11 ENCOUNTER — Encounter: Payer: Medicaid Other | Admitting: Physical Medicine and Rehabilitation

## 2022-01-25 ENCOUNTER — Encounter
Payer: Medicaid Other | Attending: Physical Medicine and Rehabilitation | Admitting: Physical Medicine and Rehabilitation

## 2022-01-25 ENCOUNTER — Encounter: Payer: Self-pay | Admitting: Physical Medicine and Rehabilitation

## 2022-01-25 ENCOUNTER — Other Ambulatory Visit: Payer: Self-pay

## 2022-01-25 VITALS — BP 121/76 | HR 66 | Temp 98.3°F | Ht 64.0 in | Wt 116.2 lb

## 2022-01-25 DIAGNOSIS — G8222 Paraplegia, incomplete: Secondary | ICD-10-CM | POA: Diagnosis not present

## 2022-01-25 DIAGNOSIS — M7918 Myalgia, other site: Secondary | ICD-10-CM | POA: Diagnosis not present

## 2022-01-25 DIAGNOSIS — N319 Neuromuscular dysfunction of bladder, unspecified: Secondary | ICD-10-CM | POA: Insufficient documentation

## 2022-01-25 DIAGNOSIS — M436 Torticollis: Secondary | ICD-10-CM | POA: Insufficient documentation

## 2022-01-25 DIAGNOSIS — G894 Chronic pain syndrome: Secondary | ICD-10-CM | POA: Diagnosis not present

## 2022-01-25 MED ORDER — BACLOFEN 20 MG PO TABS: 20.0000 mg | ORAL_TABLET | Freq: Four times a day (QID) | ORAL | 1 refills | Status: DC

## 2022-01-25 NOTE — Patient Instructions (Signed)
Pt has L1 incomplete paraplegia- ASIA D- with spasticity although not normally at L1-  ?And neurogenic bladder- with incontinence ? ? ?Patient here for trigger point injections for head tilt/forming cervical dystonia- to try and prevent- looks stable ? Consent done and on chart. ? ?Cleaned areas with alcohol and injected using a 27 gauge 1.5 inch needle ? ?Injected 2cc ?Using 1% Lidocaine with no EPI ? ?Upper traps- B/L  ?Levators- B/L  ?Posterior scalenes ?Middle scalenes- B/L  ?Splenius Capitus ?Pectoralis Major ?Rhomboids ?Infraspinatus ?Teres Major/minor ?Thoracic paraspinals ?Lumbar paraspinals ?Other injections-  ? ? ?Patient's level of pain prior was 6/10 ?Current level of pain after injections is 6/10- improved ROM ? ?There was no bleeding or complications. ? ?Patient was advised to drink a lot of water on day after injections to flush system ?Will have increased soreness for 12-48 hours after injections.  ?Can use Lidocaine patches the day AFTER injections ?Can use theracane on day of injections in places didn't inject ?Can use heating pad 4-6 hours AFTER injections ? ?2. Urology to address bladder incontinence- sees Monday ? ? ?3. Will increase baclofen 20 mg tablet 4x/day- to help with spasticity - also another tab DAILY AS NEEDED-  ?Can make sleepy and constipated.  ? ?4. 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 ?5. F/U in 3 months- will con't to assess for Botox would be helpful- already better after TrP injections-  ?

## 2022-01-25 NOTE — Progress Notes (Signed)
Patient  Is a 61 yr old female with history of tob dep, , incomplete paraplegia, L1-L2 discitis with osteomyelitis, intra-spinal abscess s/p laminectomy L1-L2, chronic hep C, neurogenic bladder, osteomyelitis of right clavicle,has some spasticity for some reason? ?S/p IV ABX as of 06/03/20 ?Is an incomplete paraplegic due to osteomyelitis- has developing torticollis of neck.  ?Here for f/u on SCI and trigger point injections.  ? ?Neck dystonia.  ?Chin is pulling still to L and head to right.  ?Also limps and favors L leg- ? ?Lasted ~  90month- when got last Trigger point injections. ? ?Muscle spasms/spasticity-  ?Feels like doesn't "feel" muscle relaxant as much anymore.  ?When bends over and then straighten up, will esp kick in then-  ?Esp notices when gets up from stool when gardening.  ?3-5x/week, having more spasms. When hits, hits for 15-20 minutes.  ?Is taking Baclofen 15 mg 4x/day. Takes as prescribed.  ? ?Bladder- ?Got oxybutynin- wearing off early-  ?Was on something else, but insurance wouldn't cover it.  Think it was Myrbetriq- but is waking up wet and as soon as feels needs to pee- almost immediately, needs to void.  ?Doesn't make it to toilet- 20-25% of time, doesn't make it to toilet.  ?Sees Urology on Monday-  ? ?[place]- ok- no accidents- occ constipation ? ?Disability- turned down again- has hearing in June 2023.  ? ? ?Exam: BP 121/75 ?Awake, alert, appropriate, head tilted some to R and chin to left; but posture appears scoliotic when sitting down so thinks it's compensatory?; NAD ?Neuro: ?MAS of 1+ in UE and LE's; hoffman's B/L; 2-3 beats clonus ?In LE's B/L  ? ?MS: ?Walks with cane-  ?LE's RLE_ HF 4/5; KE 4+/5; DF 4+/5; PF 4+/5 ?LLE- HF 4-/5; KE 4/5; DF 4/5; and PF 4+/5 ?R shoulder is down and L shoulder up above neutral ?Mild scoliosis? Appears to have a mild curve palpated in lower thoracic/lumbar which puts her angle off ? ?Plan: ?Pt has L1 incomplete paraplegia- ASIA D- with spasticity although  not normally at L1-  ?And neurogenic bladder- with incontinence ? ? ?Patient here for trigger point injections for head tilt/forming cervical dystonia- to try and prevent- looks stable ? Consent done and on chart. ? ?Cleaned areas with alcohol and injected using a 27 gauge 1.5 inch needle ? ?Injected 2cc ?Using 1% Lidocaine with no EPI ? ?Upper traps- B/L  ?Levators- B/L  ?Posterior scalenes ?Middle scalenes- B/L  ?Splenius Capitus ?Pectoralis Major ?Rhomboids ?Infraspinatus ?Teres Major/minor ?Thoracic paraspinals ?Lumbar paraspinals ?Other injections-  ? ? ?Patient's level of pain prior was 6/10 ?Current level of pain after injections is 6/10- improved ROM ? ?There was no bleeding or complications. ? ?Patient was advised to drink a lot of water on day after injections to flush system ?Will have increased soreness for 12-48 hours after injections.  ?Can use Lidocaine patches the day AFTER injections ?Can use theracane on day of injections in places didn't inject ?Can use heating pad 4-6 hours AFTER injections ? ?2. Urology to address bladder incontinence- sees Monday ? ? ?3. Will increase baclofen 20 mg tablet 4x/day- to help with spasticity - also another tab DAILY AS NEEDED-  ?Can make sleepy and constipated.  ? ?4. 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 ?5. F/U in 2 months- will con't to assess for Botox would be helpful- already better after TrP injections-  ? ? ?I spent  a total of  32  minutes on total care today- >50% coordination of care- due to  10 minutes for injections and  22 minutes on f/u increasing Baclofen and discussing possible Botox.  ? ? ?

## 2022-01-28 ENCOUNTER — Ambulatory Visit: Payer: Medicaid Other | Admitting: Urology

## 2022-02-18 ENCOUNTER — Ambulatory Visit: Payer: Medicaid Other | Admitting: Urology

## 2022-03-11 ENCOUNTER — Ambulatory Visit: Payer: Medicaid Other | Admitting: Urology

## 2022-03-11 ENCOUNTER — Encounter: Payer: Self-pay | Admitting: Urology

## 2022-03-26 ENCOUNTER — Other Ambulatory Visit: Payer: Self-pay | Admitting: Internal Medicine

## 2022-03-26 DIAGNOSIS — I10 Essential (primary) hypertension: Secondary | ICD-10-CM

## 2022-03-27 MED ORDER — AMLODIPINE BESYLATE 10 MG PO TABS: 10.0000 mg | ORAL_TABLET | Freq: Every day | ORAL | 0 refills | Status: DC

## 2022-04-19 ENCOUNTER — Ambulatory Visit: Payer: Medicaid Other | Admitting: Physical Medicine and Rehabilitation

## 2022-04-19 ENCOUNTER — Encounter
Payer: Medicaid Other | Attending: Physical Medicine and Rehabilitation | Admitting: Physical Medicine and Rehabilitation

## 2022-04-19 ENCOUNTER — Encounter: Payer: Self-pay | Admitting: Physical Medicine and Rehabilitation

## 2022-04-19 VITALS — BP 155/80 | HR 97 | Temp 98.5°F | Ht 64.0 in | Wt 112.4 lb

## 2022-04-19 DIAGNOSIS — G8222 Paraplegia, incomplete: Secondary | ICD-10-CM | POA: Diagnosis not present

## 2022-04-19 DIAGNOSIS — M7918 Myalgia, other site: Secondary | ICD-10-CM

## 2022-04-19 DIAGNOSIS — M436 Torticollis: Secondary | ICD-10-CM

## 2022-04-19 DIAGNOSIS — G894 Chronic pain syndrome: Secondary | ICD-10-CM | POA: Diagnosis not present

## 2022-04-19 NOTE — Progress Notes (Signed)
Patient  Is a 61 yr old female with history of tob dep, , incomplete paraplegia, L1-L2 discitis with osteomyelitis, intra-spinal abscess s/p laminectomy L1-L2, chronic hep C, neurogenic bladder, osteomyelitis of right clavicle,has some spasticity for some reason?Also cervical dystonia- forming-   S/p IV ABX as of 06/03/20 Is an incomplete paraplegic due to osteomyelitis- has developing torticollis of neck.  Here for f/u on SCI and trigger point injections.    Here for trigger point injections Started bothering her  2weeks ago- next appt in July.   Air quality is really bothering her form [place] fires.  Loves being outside working in gardens.   Pain is somewhat more, but wants her injections.    Has hearing supposedly here in June 2023 for disability.   Using cane still to walk.   Exam: Walks with cane LE's RLE- HF 4/5; KE 4+/5; DF 4+/5; and PF 4+/5 LLE- HF 4-/5; KE 4/5; DF 4/5; and PF 4+/5  L shoulder still elevated about neutral and R shoulder more anterior.  Some scoliosis in lower thoracic/lumbar spine still as well.   Neuro: MAS of 1 in UE and LE's hoffmans' B/L 2-3 beats clonus B/L at ankles.   Head tilted to L/chin and top of head to right- SCM's enlarged L >R;  Upper trap also pulling L>R   Plan: Patient meets criteria for disability since has incomplete L1 ASIA D paraplegia- with some reason for spasticity that work up hasn't shown why. This also affects her fall risk.  2. Scoliosis- affects fall risk make risk worse 3. Neurogenic bowel and bladder- has bladder incontinence and occ bowel incontinence- which is stable- bladder is pretty bad. Oxybutynin not works for more than 2-3 hours. Would benefit from Myrbetriq.   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  Cleaned areas with alcohol and injected using a 27 gauge 1.5 inch  needle  Injected  Using 1% Lidocaine with no EPI  Upper traps B/L  Levators- B/L  Posterior scalenes Middle scalenes- B/L  Splenius Capitus- B/L  Pectoralis Major Rhomboids Infraspinatus Teres Major/minor Thoracic paraspinals Lumbar paraspinals Other injections-    Patient's level of pain prior was 6/10 Current level of pain after injections is4/10  There was no bleeding or complications.  Patient was advised to drink a lot of water on day after injections to flush system Will have increased soreness for 12-48 hours after injections.  Can use Lidocaine patches the day AFTER injections Can use theracane on day of injections in places didn't inject Can use heating pad 4-6 hours AFTER injections   Improved ROM/positioning of neck/chin and less pain already  5. Spasticity- Baclofen 20 mg TID and occ takes 4th dose/day. Con't Baclofen-.   6. Will get pt Botox for Torticollis.   7. F/U in 6 weeks for Trigger point injections for myofascial pain as well/neck pain- might not need in future if gets Botox.    I spent a total of 35   minutes on total care today- >50% coordination of care- due to 10 minutes on Injections- and 25 minutes on discussing pain and Cervical dystonia

## 2022-04-19 NOTE — Patient Instructions (Signed)
Exam: Walks with cane LE's RLE- HF 4/5; KE 4+/5; DF 4+/5; and PF 4+/5 LLE- HF 4-/5; KE 4/5; DF 4/5; and PF 4+/5  L shoulder still elevated about neutral and R shoulder more anterior.  Some scoliosis in lower thoracic/lumbar spine still as well.   Neuro: MAS of 1 in UE and LE's hoffmans' B/L 2-3 beats clonus B/L at ankles.   Head tilted to L/chin and top of head to right- SCM's enlarged L >R;  Upper trap also pulling L>R   Plan: Patient meets criteria for disability since has incomplete L1 ASIA D paraplegia- with some reason for spasticity that work up hasn't shown why. This also affects her fall risk.  2. Scoliosis- affects fall risk make risk worse 3. Neurogenic bowel and bladder- has bladder incontinence and occ bowel incontinence- which is stable- bladder is pretty bad. Oxybutynin not works for more than 2-3 hours. Would benefit from Myrbetriq.   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  Cleaned areas with alcohol and injected using a 27 gauge 1.5 inch needle  Injected  Using 1% Lidocaine with no EPI  Upper traps B/L  Levators- B/L  Posterior scalenes Middle scalenes- B/L  Splenius Capitus- B/L  Pectoralis Major Rhomboids Infraspinatus Teres Major/minor Thoracic paraspinals Lumbar paraspinals Other injections-    Patient's level of pain prior was 6/10 Current level of pain after injections is4/10  There was no bleeding or complications.  Patient was advised to drink a lot of water on day after injections to flush system Will have increased soreness for 12-48 hours after injections.  Can use Lidocaine patches the day AFTER injections Can use theracane on day of injections in places didn't inject Can use heating pad 4-6 hours AFTER injections   Improved ROM/positioning of neck/chin and less pain already  5. Spasticity- Baclofen  20 mg TID and occ takes 4th dose/day. Con't Baclofen-.   6. Will get pt Botox for Torticollis.   7. F/U in 6 weeks for Trigger point injections for myofascial pain as well/neck pain- might not need in future if gets Botox.

## 2022-04-22 ENCOUNTER — Telehealth: Payer: Self-pay

## 2022-04-22 NOTE — Telephone Encounter (Signed)
Patient called stating that she thinks she is having a reaction to the trigger points. She states her feet, ankles, and legs swollen. Swelling is down but still little swollen. She states that was the only thing new.

## 2022-05-06 ENCOUNTER — Encounter: Payer: Self-pay | Admitting: Urology

## 2022-05-06 ENCOUNTER — Ambulatory Visit (INDEPENDENT_AMBULATORY_CARE_PROVIDER_SITE_OTHER): Payer: Medicaid Other | Admitting: Urology

## 2022-05-06 VITALS — BP 133/74 | HR 94 | Ht 64.0 in | Wt 115.0 lb

## 2022-05-06 DIAGNOSIS — N3946 Mixed incontinence: Secondary | ICD-10-CM

## 2022-05-06 LAB — BLADDER SCAN AMB NON-IMAGING

## 2022-05-06 MED ORDER — SOLIFENACIN SUCCINATE 5 MG PO TABS: 5.0000 mg | ORAL_TABLET | Freq: Every day | ORAL | 3 refills | Status: AC

## 2022-05-15 ENCOUNTER — Other Ambulatory Visit: Payer: Self-pay | Admitting: Internal Medicine

## 2022-05-20 ENCOUNTER — Other Ambulatory Visit: Payer: Self-pay | Admitting: Internal Medicine

## 2022-05-21 ENCOUNTER — Ambulatory Visit: Payer: Self-pay

## 2022-05-21 NOTE — Telephone Encounter (Signed)
     Chief Complaint: Pt. States her Amlodipine was increased in December from 1 pill a day to 2 pills a day. She is now out of medicine and insurance won't allow  a refill because it is early. Need new instructions sent to pharmacy so medication can be refilled. Pt. Has been out of meds x 2 days. Symptoms: n/a Frequency: n/a Pertinent Negatives: Patient denies n/a Disposition: [] ED /[] Urgent Care (no appt availability in office) / [] Appointment(In office/virtual)/ []  [facility]/ [] Home Care/ [] Refused Recommended Disposition /[] [facility]/ [x]  Follow-up with PCP Additional Notes: Please advise pt.  Answer Assessment - Initial Assessment Questions 1. NAME of MEDICATION: "What medicine are you calling about?"     Amlodipine  2. QUESTION: "What is your question?" (e.g., double dose of medicine, side effect)     PCP increased medication to twice a day  and now she is out and insurance will not pay to fill it 3. PRESCRIBING HCP: "Who prescribed it?" Reason: if prescribed by specialist, call should be referred to that group.     Dr. 4. SYMPTOMS: "Do you have any symptoms?"     N/a 5. SEVERITY: If symptoms are present, ask "Are they mild, moderate or severe?"     N/a 6. PREGNANCY:  "Is there any chance that you are pregnant?" "When was your last menstrual period?"     No  Protocols used: Medication Question Call-A-AH

## 2022-05-21 NOTE — Telephone Encounter (Signed)
Summary: pt taking more med than script is written but states as instruction at last visit   amLODipine (NORVASC) 10 MG tablet 90 tablet 0 05/20/2022  Sig: TAKE 1 TABLET BY MOUTH EVERY DAY  Sent to pharmacy as: amLODipine (NORVASC) 10 MG tablet  E-Prescribing Status: Receipt confirmed by pharmacy (05/20/2022   Pt needs clarification on this med, states last time in office was told to take (2) daily and ran out early. The new script still states 1 per day. Pt needs to know if should pu this script. Pls advise [phone]/ States has been taking 2 almost a month now and BP is perfect. FU     Called pt - LMOM to return call 

## 2022-05-21 NOTE — Telephone Encounter (Signed)
Summary: pt taking more med than script is written but states as instruction at last visit   amLODipine (NORVASC) 10 MG tablet 90 tablet 0 05/20/2022  Sig: TAKE 1 TABLET BY MOUTH EVERY DAY  Sent to pharmacy as: amLODipine (NORVASC) 10 MG tablet  E-Prescribing Status: Receipt confirmed by pharmacy (05/20/2022   Pt needs clarification on this med, states last time in office was told to take (2) daily and ran out early. The new script still states 1 per day. Pt needs to know if should pu this script. Pls advise [phone] States has been taking 2 almost a month now and BP is perfect. FU     Called pt - LMOM to return call

## 2022-05-22 NOTE — Telephone Encounter (Signed)
Routing to PCP for review.

## 2022-05-23 NOTE — Telephone Encounter (Signed)
[facility] called and spoke to [place], [facility]. He states they are unable to refill d/t too early. Pt received #90 03/27/22 and still has 1 month before being able to refill thru insurance. I explained that pt has been taking incorrect dosage and anyway to refill sooner so she doesn't go 1 month without medication. They can fill 1 month supply and cost $14.12 without filing insurance.   Called pt. LVMTCB to discuss above.

## 2022-05-23 NOTE — Telephone Encounter (Signed)
Pt called, advised of message from Dr. Laural Benes. Pt states she has been taking 2 10mg  tabs because she thought that's what she was told at her last OV. I explained maybe she misunderstood and this was for when she had 5mg  tabs. Explained to pt only take 1 10mg  tab daily. Pt verbalized understanding and states she is out the medication. She took last one on Saturday and [facility] wont [facility]ill until August d/t insurance not covering for too early. Advised pt that rx was sent in on 05/20/22 for #90/0 for 10mg . Pt states she has called pharmacy and they wont fill. Explained to pt I would call pharmacy and give her a CB in a few mins once I spoke with them. Pt verbalized understanding.

## 2022-06-03 ENCOUNTER — Encounter
Payer: Medicaid Other | Attending: Physical Medicine and Rehabilitation | Admitting: Physical Medicine and Rehabilitation

## 2022-06-03 ENCOUNTER — Encounter: Payer: Self-pay | Admitting: Physical Medicine and Rehabilitation

## 2022-06-03 VITALS — BP 125/73 | HR 76 | Ht 64.0 in | Wt 113.8 lb

## 2022-06-03 DIAGNOSIS — M436 Torticollis: Secondary | ICD-10-CM | POA: Diagnosis not present

## 2022-06-03 DIAGNOSIS — M7918 Myalgia, other site: Secondary | ICD-10-CM | POA: Insufficient documentation

## 2022-06-03 DIAGNOSIS — G894 Chronic pain syndrome: Secondary | ICD-10-CM | POA: Diagnosis not present

## 2022-06-03 DIAGNOSIS — G8222 Paraplegia, incomplete: Secondary | ICD-10-CM | POA: Insufficient documentation

## 2022-06-03 NOTE — Progress Notes (Signed)
Patient  Is a 61 yr old female with history of tob dep, , incomplete paraplegia, L1-L2 discitis with osteomyelitis, intra-spinal abscess s/p laminectomy L1-L2, chronic hep C, neurogenic bladder, osteomyelitis of right clavicle,has some spasticity for some reason?Also cervical dystonia- forming-   S/p IV ABX as of 06/03/20 Is an incomplete paraplegic due to osteomyelitis- has developing torticollis of neck.  Here for f/u on SCI and trigger point injections.   Things doing OK.   Has hearing coming up 07/09/22 for disability.   Still using cane to walk.   Finally got a decent car to drive- husband got new /great job.   Saw Urology 2 weeks ago- started Vesicare/Solifenacin.  Helping a little more.   Exam: Walks with cane- lumbar flexion and scoliosis notable, NAD LE's RLE- HF 4/5; KE 4+/5; DF 4+/5; and PF 4+5 LLE- HF 4-/5; KE 4/5; DF 4/5 and PF 4+/5  L shoulder still elevated and R shoulder more anterior Scoliosis significant lower thoracic/lumbar spine.   Neuro: MAS of 1 in arms- signs of spasticity and Hoffmans' B/L  2-3 beats clonus B?l  Head still tilted to L /chin and top fo head ot R- SCMS enlarged L>R Upper trap also tight L>>R   Plan: L1 ASIA D paraplegia, but also has spasticity in UE and LE's- affect her fall risk and makes her very rpone to fall.  Scoliosis- affects fall risk and pain Bowel and bladder- neurogenic due to SCI- frequent bladder incontinence and occ bowel incontinence- now on Vesicare, which is more helpful.  Appointment with Dr Wynn Banker for Botox 06/13/22- will do trigger point injections today for now.  Spasticity- con't Baclofen 20 mg TID and occ takes 4th dose/day.  6. Patient here for trigger point injections for  Consent done and on chart.  Cleaned areas with alcohol and injected using a 27 gauge 1.5 inch needle  Injected 3cc Using 1% Lidocaine with no EPI  Upper traps B/L  Levators- B/L  Posterior scalenes Middle scalenes- B/L  Splenius  Capitus- B/L  Pectoralis Major Rhomboids Infraspinatus Teres Major/minor Thoracic paraspinals Lumbar paraspinals Other injections-    Patient's level of pain prior was 6/10 Current level of pain after injections is around 6/10- but usually down when gets to car- and less tight  There was no bleeding or complications.  Patient was advised to drink a lot of water on day after injections to flush system Will have increased soreness for 12-48 hours after injections.  Can use Lidocaine patches the day AFTER injections Can use theracane on day of injections in places didn't inject Can use heating pad 4-6 hours AFTER injections  7. F/U in 2 months- for possible trigger point injections and f/u on SCI

## 2022-06-03 NOTE — Patient Instructions (Addendum)
Exam: Walks with cane- lumbar flexion and scoliosis notable, NAD LE's RLE- HF 4/5; KE 4+/5; DF 4+/5; and PF 4+5 LLE- HF 4-/5; KE 4/5; DF 4/5 and PF 4+/5  L shoulder still elevated and R shoulder more anterior Scoliosis significant lower thoracic/lumbar spine.   Neuro: MAS of 1 in arms- signs of spasticity and Hoffmans' B/L  2-3 beats clonus B?l  Head still tilted to L /chin and top fo head ot R- SCMS enlarged L>R Upper trap also tight L>>R   Plan: L1 ASIA D paraplegia, but also has spasticity in UE and LE's- affect her fall risk and makes her very rpone to fall.  Scoliosis- affects fall risk and pain Bowel and bladder- neurogenic due to SCI- frequent bladder incontinence and occ bowel incontinence- now on Vesicare, which is more helpful.  Appointment with Dr Wynn Banker for Botox 06/13/22- will do trigger point injections today for now.  Spasticity- con't Baclofen 20 mg TID and occ takes 4th dose/day.  6. Patient here for trigger point injections for  Consent done and on chart.  Cleaned areas with alcohol and injected using a 27 gauge 1.5 inch needle  Injected 3cc Using 1% Lidocaine with no EPI  Upper traps B/L  Levators- B/L  Posterior scalenes Middle scalenes- B/L  Splenius Capitus- B/L  Pectoralis Major Rhomboids Infraspinatus Teres Major/minor Thoracic paraspinals Lumbar paraspinals Other injections-    Patient's level of pain prior was 6/10 Current level of pain after injections is around 6/10- but usually down when gets to car- and less tight  There was no bleeding or complications.  Patient was advised to drink a lot of water on day after injections to flush system Will have increased soreness for 12-48 hours after injections.  Can use Lidocaine patches the day AFTER injections Can use theracane on day of injections in places didn't inject Can use heating pad 4-6 hours AFTER injections  7. F/U in 2 months- for possible trigger point injections and f/u on SCI

## 2022-06-13 ENCOUNTER — Encounter: Payer: Self-pay | Admitting: Physical Medicine & Rehabilitation

## 2022-06-13 ENCOUNTER — Encounter: Payer: Medicaid Other | Attending: Physical Medicine and Rehabilitation | Admitting: Physical Medicine & Rehabilitation

## 2022-06-13 VITALS — BP 136/81 | HR 84 | Temp 98.2°F | Ht 64.0 in | Wt 106.0 lb

## 2022-06-13 DIAGNOSIS — M5382 Other specified dorsopathies, cervical region: Secondary | ICD-10-CM | POA: Diagnosis present

## 2022-06-13 DIAGNOSIS — M436 Torticollis: Secondary | ICD-10-CM | POA: Diagnosis not present

## 2022-06-13 NOTE — Patient Instructions (Signed)

## 2022-06-13 NOTE — Progress Notes (Signed)
Botox Injection for spasticity using needle EMG guidance  Dilution: 50 Units/ml Indication: Severe spasticity which interferes with ADL,mobility and/or  hygiene and is unresponsive to medication management and other conservative care Informed consent was obtained after describing risks and benefits of the procedure with the patient. This includes bleeding, bruising, infection, excessive weakness, or medication side effects. A REMS form is on file and signed. Needle: 30g 1" needle electrode Number of units per muscle RIghtt Long cap 50U Splenius cap 50U Levator scap 50U  Left Splenius cap 50U  All injections were done after obtaining appropriate EMG activity and after negative drawback for blood. The patient tolerated the procedure well. Post procedure instructions were given. A followup appointment was made.

## 2022-06-19 ENCOUNTER — Telehealth: Payer: Self-pay

## 2022-06-19 DIAGNOSIS — M436 Torticollis: Secondary | ICD-10-CM

## 2022-06-19 NOTE — Telephone Encounter (Signed)
Day after Botox had diarrhea and low grade fever. She states she had to force her chin off her chest.

## 2022-06-20 NOTE — Telephone Encounter (Signed)
She reports that she will need that soft collar sent in.

## 2022-06-21 ENCOUNTER — Ambulatory Visit: Payer: Self-pay | Admitting: *Deleted

## 2022-06-21 NOTE — Telephone Encounter (Signed)
Left message for pt. To call back about symptoms.

## 2022-06-21 NOTE — Telephone Encounter (Signed)
Pt has upcoming appointment on 06/24/2022

## 2022-06-21 NOTE — Telephone Encounter (Signed)
I have informed her we faxed order over to [facility] for the collar but it is not file-able with insurance and will be out of pocket between 10-20 dollars.

## 2022-06-21 NOTE — Telephone Encounter (Signed)
Third attempt to reach pt. Left message to call back. 

## 2022-06-21 NOTE — Telephone Encounter (Signed)
Summary: Possible stomach virus had nausea and now has diarrhea.   Possible stomach virus had nausea and now has diarrhea. Pt stated everything goes right through her and had a light fever when this started about 3 days ago. 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   Pt is requesting medication.   Pt seeking clinical advice.     Attempted to call patient- left message to call office

## 2022-06-24 ENCOUNTER — Encounter: Payer: Self-pay | Admitting: Nurse Practitioner

## 2022-06-24 ENCOUNTER — Ambulatory Visit: Payer: Medicaid Other | Attending: Nurse Practitioner | Admitting: Nurse Practitioner

## 2022-06-24 VITALS — BP 144/90 | HR 101 | Temp 97.7°F | Ht 64.0 in | Wt 112.6 lb

## 2022-06-24 DIAGNOSIS — I10 Essential (primary) hypertension: Secondary | ICD-10-CM | POA: Diagnosis not present

## 2022-06-24 DIAGNOSIS — E1165 Type 2 diabetes mellitus with hyperglycemia: Secondary | ICD-10-CM

## 2022-06-24 DIAGNOSIS — Z23 Encounter for immunization: Secondary | ICD-10-CM | POA: Diagnosis not present

## 2022-06-24 NOTE — Progress Notes (Signed)
No concerns. 

## 2022-06-24 NOTE — Progress Notes (Signed)
Assessment & Plan:  Debra Schmidt was seen today for hypertension.  Diagnoses and all orders for this visit:  Essential hypertension -     CMP14+EGFR Continue amlodipine as prescribed.  Reminded to bring in blood pressure log for follow  up appointment.  RECOMMENDATIONS: DASH/Mediterranean Diets are healthier choices for HTN.    Type 2 diabetes mellitus with hyperglycemia, without long-term current use of insulin (HCC) -     Hemoglobin A1c Continue blood sugar control as discussed in office today, low carbohydrate diet, and regular physical exercise as tolerated, 150 minutes per week (30 min each day, 5 days per week, or 50 min 3 days per week). Keep blood sugar logs with fasting goal of 90-130 mg/dl, post prandial (after you eat) less than 180.  For Hypoglycemia: BS <60 and Hyperglycemia BS >400; contact the clinic ASAP. Annual eye exams and foot exams are recommended.   Need for shingles vaccine -     Varicella-zoster vaccine IM      Patient has been counseled on age-appropriate routine health concerns for screening and prevention. These are reviewed and up-to-date. Referrals have been placed accordingly. Immunizations are up-to-date or declined.    Subjective:   Chief Complaint  Patient presents with   Hypertension   Hypertension Pertinent negatives include no blurred vision, chest pain, headaches, malaise/fatigue, palpitations or shortness of breath.   Debra Schmidt 61 y.o. 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    Patient has been counseled on age-appropriate routine health concerns for screening and prevention. These are  reviewed and up-to-date. Referrals have been placed accordingly. Immunizations are up-to-date or declined.     MAMMOGRAM: still waiting. Has not been scheduled PAP SMEAR: OVERDUE. Schedule next visit COLOGUARD: 11-2021 DUE 3 years. NORMAL   HTN Blood pressure is elevated. She had been out of her amlodipine 24m and this was recently refilled. She has not taken it today but reports normal readings as her baseline.  BP Readings from Last 3 Encounters:  06/24/22 (!) 145/92  06/13/22 136/81  06/03/22 125/73      Review of Systems  Constitutional:  Negative for fever, malaise/fatigue and weight loss.  HENT: Negative.  Negative for nosebleeds.   Eyes: Negative.  Negative for blurred vision, double vision and photophobia.  Respiratory: Negative.  Negative for cough and shortness of breath.   Cardiovascular: Negative.  Negative for chest pain, palpitations and leg swelling.  Gastrointestinal: Negative.  Negative for heartburn, nausea and vomiting.  Musculoskeletal:  Negative for myalgias.       Torticollis  Neurological: Negative.  Negative for dizziness, focal weakness, seizures and headaches.  Psychiatric/Behavioral: Negative.  Negative for suicidal ideas.     Past Medical History:  Diagnosis Date   Benign torticollis    Chronic pain    Diabetes mellitus without complication ([place]    Hypertension    Myofascial pain dysfunction syndrome    Neurogenic bladder     Past Surgical History:  Procedure Laterality Date   ACROMIO-CLAVICULAR JOINT REPAIR Right 04/25/2020   Procedure: ACROMIO-CLAVICULAR JOINT REPAIR;  Surgeon: MRenette Butters MD;  [facility]  Service: Orthopedics;  Laterality: Right;   I & D EXTREMITY Right 04/25/2020   Procedure: Open debridement  R AC joint; Distal clavicle repair;  Surgeon: Renette Butters, MD;  Location: [place];  Service: Orthopedics;  Laterality: Right;   LUMBAR LAMINECTOMY/DECOMPRESSION MICRODISCECTOMY N/A 04/23/2020   Procedure: Lumbar One-Two  LAMINECTOMY for Epidural Abscess Evacuation;  Surgeon: Ashok Pall, MD;  Location: [place];  Service: Neurosurgery;  Laterality: N/A;   RESECTION DISTAL CLAVICAL Right 04/25/2020   Procedure: RESECTION DISTAL CLAVICAL;  Surgeon: Renette Butters, MD;  Location: [place];  Service: Orthopedics;  Laterality: Right;    Family History  Problem Relation Age of Onset   Cancer Mother    Cancer Father     Social History Reviewed with no changes to be made today.   Outpatient Medications Prior to Visit  Medication Sig Dispense Refill   acetaminophen (TYLENOL) 325 MG tablet Take 2 tablets (650 mg total) by mouth 3 (three) times daily.     amLODipine (NORVASC) 10 MG tablet TAKE 1 TABLET BY MOUTH EVERY DAY 90 tablet 0   baclofen (LIORESAL) 20 MG tablet Take 1 tablet (20 mg total) by mouth 4 (four) times daily. Can take an additional tab 1x/day AS NEEDED as well- for spasticity 450 each 1   calcium citrate (CALCITRATE - DOSED IN MG ELEMENTAL CALCIUM) 950 (200 Ca) MG tablet Take 1 tablet (200 mg of elemental calcium total) by mouth daily.     chlorhexidine (PERIDEX) 0.12 % solution SMARTSIG:By Mouth     docusate sodium (COLACE) 100 MG capsule Take 2 capsules (200 mg total) by mouth daily. 60 capsule 3   magnesium oxide (MAG-OX) 400 (241.3 Mg) MG tablet Take 0.5 tablets (200 mg total) by mouth 2 (two) times daily. 30 tablet 2   Multiple Vitamin (MULTIVITAMIN WITH MINERALS) TABS tablet Take 1 tablet by mouth daily. 100 tablet 1   solifenacin (VESICARE) 5 MG tablet Take 1 tablet (5 mg total) by mouth daily. 90 tablet 3   oxybutynin (DITROPAN-XL) 10 MG 24 hr tablet Take 1 tablet (10 mg total) by mouth daily. (Patient not taking: Reported on 06/13/2022) 30 tablet 11   No facility-administered medications prior to visit.    Allergies  Allergen Reactions   Adhesive [Tape]    Penicillins Hives    Tolerated Zosyn and Cefazolin 04/2020       Objective:    BP (!) 145/92   Pulse (!) 101   Temp 97.7 F (36.5  C) (Oral)   Ht 5' 4"  (1.626 m)   Wt 112 lb 9.6 oz (51.1 kg)   SpO2 96%   BMI 19.33 kg/m  Wt Readings from Last 3 Encounters:  06/24/22 112 lb 9.6 oz (51.1 kg)  06/13/22 106 lb (48.1 kg)  06/03/22 113 lb 12.8 oz (51.6 kg)    Physical Exam Vitals and nursing note reviewed.  Constitutional:      Appearance: She is well-developed.  HENT:     Head: Normocephalic and atraumatic.  Cardiovascular:     Rate and Rhythm: Normal rate and regular rhythm.     Heart sounds: Normal heart sounds. No murmur heard.    No friction rub. No gallop.  Pulmonary:     Effort: Pulmonary effort is normal. No tachypnea or respiratory distress.     Breath sounds: Normal breath sounds. No decreased breath sounds, wheezing, rhonchi or rales.  Chest:     Chest wall: No tenderness.  Abdominal:     General: Bowel sounds are normal.     Palpations: Abdomen is soft.  Musculoskeletal:     Cervical back:  Torticollis (wearing a cervical collar) present. Decreased range of motion.  Skin:    General: Skin is warm and dry.  Neurological:     Mental Status: She is alert and oriented to person, place, and time.     Coordination: Coordination normal.  Psychiatric:        Behavior: Behavior normal. Behavior is cooperative.        Thought Content: Thought content normal.        Judgment: Judgment normal.          Patient has been counseled extensively about nutrition and exercise as well as the importance of adherence with medications and regular follow-up. The patient was given clear instructions to go to ER or return to medical center if symptoms don't improve, worsen or new problems develop. The patient verbalized understanding.   Follow-up: Return for PAP smear with PCP.   Gildardo Pounds, FNP-BC [facility] [place], [place]   06/24/2022, 2:40 PM

## 2022-06-26 LAB — CMP14+EGFR
ALT: 22 IU/L (ref 0–32)
AST: 25 IU/L (ref 0–40)
Albumin/Globulin Ratio: 1.3 (ref 1.2–2.2)
Albumin: 3.8 g/dL — ABNORMAL LOW (ref 3.9–4.9)
Alkaline Phosphatase: 104 IU/L (ref 44–121)
BUN/Creatinine Ratio: 16 (ref 12–28)
BUN: 9 mg/dL (ref 8–27)
Bilirubin Total: 0.2 mg/dL (ref 0.0–1.2)
CO2: 27 mmol/L (ref 20–29)
Calcium: 9.1 mg/dL (ref 8.7–10.3)
Chloride: 102 mmol/L (ref 96–106)
Creatinine, Ser: 0.58 mg/dL (ref 0.57–1.00)
Globulin, Total: 3 g/dL (ref 1.5–4.5)
Glucose: 95 mg/dL (ref 70–99)
Potassium: 4.1 mmol/L (ref 3.5–5.2)
Sodium: 144 mmol/L (ref 134–144)
Total Protein: 6.8 g/dL (ref 6.0–8.5)
eGFR: 103 mL/min/{1.73_m2} (ref >59)

## 2022-06-26 LAB — HEMOGLOBIN A1C
Est. average glucose Bld gHb Est-mCnc: 120 mg/dL
Hgb A1c MFr Bld: 5.8 % — ABNORMAL HIGH (ref 4.8–5.6)

## 2022-07-08 ENCOUNTER — Ambulatory Visit: Payer: Medicaid Other | Admitting: Urology

## 2022-07-08 ENCOUNTER — Encounter: Payer: Self-pay | Admitting: Urology

## 2022-07-10 ENCOUNTER — Telehealth: Payer: Self-pay

## 2022-07-10 NOTE — Telephone Encounter (Addendum)
Debra Schmidt received the Botox injection on 06/13/2022. Per patient she is having a problem holding her head up. Even with the collar in place it wears her out. Because she has to hold her neck in place with the collar. She stated her condition has worsen  Please advise.   Call back phone --[phone] or [phone].  Patient has been added on the schedule for 2:15 tomorrow.  Marland Kitchen

## 2022-07-11 ENCOUNTER — Ambulatory Visit: Payer: Medicaid Other | Admitting: Internal Medicine

## 2022-07-11 ENCOUNTER — Encounter: Payer: Self-pay | Admitting: Physical Medicine & Rehabilitation

## 2022-07-11 ENCOUNTER — Encounter (HOSPITAL_BASED_OUTPATIENT_CLINIC_OR_DEPARTMENT_OTHER): Payer: Medicaid Other | Admitting: Physical Medicine & Rehabilitation

## 2022-07-11 VITALS — BP 151/73 | HR 110 | Ht 64.0 in | Wt 105.8 lb

## 2022-07-11 DIAGNOSIS — M436 Torticollis: Secondary | ICD-10-CM | POA: Diagnosis not present

## 2022-07-11 DIAGNOSIS — M5382 Other specified dorsopathies, cervical region: Secondary | ICD-10-CM | POA: Diagnosis not present

## 2022-07-11 NOTE — Progress Notes (Signed)
Subjective:    Patient ID: Debra Schmidt, female    DOB: May 31, 1961, 61 y.o.   MRN: [id number]  HPI  61 year old female with history of spasmodic torticollis who returns today with primary complaint of neck weakness. Pt c/o of neck weakness starting 1 day after Botox injection, worsened over the following week.  Does not feel neck pain is any better .    RIght Long cap 50U Splenius cap 50U Levator scap 50U   Left Splenius cap 50U   The patient is wearing a soft collar after she called in with the complaints.  She states her head tends to go forward.  She does not need the collar when laying down.  She is able to turn her head to the side. Pain Inventory Average Pain 0 Pain Right Now 0 My pain is  no pain  In the last 24 hours, has pain interfered with the following? General activity 0 Relation with others 0 Enjoyment of life 0 What TIME of day is your pain at its worst? No pain Sleep (in general) Poor   since botox  Pain is worse with:  no pain Pain improves with:  no pain Relief from Meds:  no pain  Family History  Problem Relation Age of Onset   Cancer Mother    Cancer Father    Social History   Socioeconomic History   Marital status: Media planner    Spouse name: Not on file   Number of children: Not on file   Years of education: Not on file   Highest education level: Not on file  Occupational History   Not on file  Tobacco Use   Smoking status: Every Day    Packs/day: 0.30    Types: Cigarettes    Passive exposure: Current   Smokeless tobacco: Never  Vaping Use   Vaping Use: Never used  Substance and Sexual Activity   Alcohol use: Not Currently   Drug use: Not Currently   Sexual activity: Not on file  Other Topics Concern   Not on file  Social History Narrative   Not on file   Social Determinants of Health   Financial Resource Strain: Not on file  Food Insecurity: Not on file  Transportation Needs: Not on file  Physical Activity: Not on  file  Stress: Not on file  Social Connections: Not on file   Past Surgical History:  Procedure Laterality Date   ACROMIO-CLAVICULAR JOINT REPAIR Right 04/25/2020   Procedure: ACROMIO-CLAVICULAR JOINT REPAIR;  Surgeon: Sheral Apley, MD;  Location: [facility] OR;  Service: Orthopedics;  Laterality: Right;   I & D EXTREMITY Right 04/25/2020   Procedure: Open debridement R AC joint; Distal clavicle repair;  Surgeon: Sheral Apley, MD;  Location: [facility] OR;  Service: Orthopedics;  Laterality: Right;   LUMBAR LAMINECTOMY/DECOMPRESSION MICRODISCECTOMY N/A 04/23/2020   Procedure: Lumbar One-Two LAMINECTOMY for Epidural Abscess Evacuation;  Surgeon: Coletta Memos, MD;  Location: [facility] OR;  Service: Neurosurgery;  Laterality: N/A;   RESECTION DISTAL CLAVICAL Right 04/25/2020   Procedure: RESECTION DISTAL CLAVICAL;  Surgeon: Sheral Apley, MD;  Location: [facility] OR;  Service: Orthopedics;  Laterality: Right;   Past Surgical History:  Procedure Laterality Date   ACROMIO-CLAVICULAR JOINT REPAIR Right 04/25/2020   Procedure: ACROMIO-CLAVICULAR JOINT REPAIR;  Surgeon: Sheral Apley, MD;  Location: [facility] OR;  Service: Orthopedics;  Laterality: Right;   I & D EXTREMITY Right 04/25/2020   Procedure: Open debridement R AC joint; Distal clavicle repair;  Surgeon: Sheral Apley, MD;  Location: [facility] OR;  Service: Orthopedics;  Laterality: Right;   LUMBAR LAMINECTOMY/DECOMPRESSION MICRODISCECTOMY N/A 04/23/2020   Procedure: Lumbar One-Two LAMINECTOMY for Epidural Abscess Evacuation;  Surgeon: Coletta Memos, MD;  Location: [facility] OR;  Service: Neurosurgery;  Laterality: N/A;   RESECTION DISTAL CLAVICAL Right 04/25/2020   Procedure: RESECTION DISTAL CLAVICAL;  Surgeon: Sheral Apley, MD;  Location: [facility] OR;  Service: Orthopedics;  Laterality: Right;   Past Medical History:  Diagnosis Date   Benign torticollis    Chronic pain    Diabetes mellitus without complication (HCC)    Hypertension    Myofascial pain dysfunction  syndrome    Neurogenic bladder    BP (!) 151/73   Pulse (!) 110   Ht 5\' 4"  (1.626 m)   Wt 105 lb 12.8 oz (48 kg)   SpO2 92%   BMI 18.16 kg/m   Opioid Risk Score:   Fall Risk Score:  `1  Depression screen [facility] 2/9     07/11/2022    2:33 PM 06/13/2022   12:18 PM 06/03/2022   10:33 AM 04/19/2022    2:31 PM 01/25/2022    9:39 AM 10/18/2021    1:44 PM 09/06/2021    3:30 PM  Depression screen PHQ 2/9  Decreased Interest 1 1 0 1 0 0 0  Down, Depressed, Hopeless 1 1 0 1 0 0 1  PHQ - 2 Score 2 2 0 2 0 0 1  Altered sleeping      1   Tired, decreased energy      0   Change in appetite      0   Feeling bad or failure about yourself       0   Trouble concentrating      0   Moving slowly or fidgety/restless      0   Suicidal thoughts      0   PHQ-9 Score      1      Review of Systems  Constitutional: Negative.   HENT: Negative.    Eyes: Negative.   Respiratory: Negative.    Cardiovascular: Negative.   Gastrointestinal: Negative.   Endocrine: Negative.   Genitourinary: Negative.   Musculoskeletal:        Neck weakness  Skin: Negative.   Allergic/Immunologic: Negative.   Neurological:  Positive for weakness.       Here to follow up on weak neck after injection of botulinum toxin  Hematological: Negative.   Psychiatric/Behavioral:  Positive for dysphoric mood.   All other systems reviewed and are negative.      Objective:   Physical Exam Vitals and nursing note reviewed.  Constitutional:      Comments: No acute distress  HENT:     Head: Normocephalic and atraumatic.  Neck:     Comments: The patient has normal lateral bending as well as rotation of the head. She is able to go from supine to sit without extension lag.  She is able to go from side-lying to sit without lateral lag.  In a prone position the patient is able to lift her head to a neutral position but not hyperextended.  Musculoskeletal:     Comments: No tenderness palpation in the cervical paraspinal there is  some mild tenderness at the upper trap area.  Neurological:     Mental Status: She is alert.     Comments: Hypertonicity of levator scapula bilaterally.  No tone excess in the sternocleidomastoid.  No tone excess in the trapezius bilaterally           Assessment & Plan:   1.  Neck extensor weakness likely overflow of botulinum toxin from the injected muscles into the neck extensor musculature.  As discussed it is now peaking and will slowly subside over the next 4 to 6 weeks.  Neck strength will be back to normal at 3 months postinjection.  We discussed that given her minimal change in neck pain even after injection would not recommend repeat injection.  She should continue working on myofascial pain.  She will see Dr. Alice Reichert for this on 08/05/2022. We discussed the possibility of a low-dose injection in the future potentially just targeting levator scapula if symptoms worsen.

## 2022-07-11 NOTE — Patient Instructions (Addendum)
Wear soft collar when out of bed Expect Botox to slowly wear off in the next 6-8 wks Would hold off on further botox injections but could consider low dose injection in the future, if muscles get tighter

## 2022-08-05 ENCOUNTER — Encounter: Payer: Self-pay | Admitting: Physical Medicine and Rehabilitation

## 2022-08-05 ENCOUNTER — Encounter
Payer: Medicaid Other | Attending: Physical Medicine and Rehabilitation | Admitting: Physical Medicine and Rehabilitation

## 2022-08-05 VITALS — BP 138/82 | HR 97 | Temp 98.6°F | Ht 64.0 in | Wt 108.0 lb

## 2022-08-05 DIAGNOSIS — G894 Chronic pain syndrome: Secondary | ICD-10-CM

## 2022-08-05 DIAGNOSIS — M436 Torticollis: Secondary | ICD-10-CM | POA: Diagnosis not present

## 2022-08-05 DIAGNOSIS — M7918 Myalgia, other site: Secondary | ICD-10-CM | POA: Diagnosis not present

## 2022-08-05 MED ORDER — LIDOCAINE HCL 1 % IJ SOLN
6.0000 mL | Freq: Once | INTRAMUSCULAR | Status: AC
  Administered 2022-08-05: 6 mL

## 2022-08-05 MED ORDER — BACLOFEN 20 MG PO TABS
20.0000 mg | ORAL_TABLET | Freq: Four times a day (QID) | ORAL | 1 refills | Status: DC
Start: 2022-08-05 — End: 2022-11-18
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
## 2022-08-26 ENCOUNTER — Other Ambulatory Visit: Payer: Self-pay | Admitting: Internal Medicine

## 2022-08-26 NOTE — Telephone Encounter (Signed)
Medication Refill - Medication: amLODipine (NORVASC) 10 MG tablet  Has the patient contacted their pharmacy? Yes.   (She said [facility] told her no response from dr and to call our office. She is out of medication Preferred Pharmacy (with phone number or street name): [id number] - [place], [place].  Has the patient been seen for an appointment in the last year OR does the patient have an upcoming appointment? Yes.    Agent: Please be advised that RX refills may take up to 3 business days. We ask that you follow-up with your pharmacy.

## 2022-08-27 MED ORDER — AMLODIPINE BESYLATE 10 MG PO TABS: 10.0000 mg | ORAL_TABLET | Freq: Every day | ORAL | 1 refills | Status: DC

## 2022-08-27 NOTE — Telephone Encounter (Signed)
Requested Prescriptions  Pending Prescriptions Disp Refills  . amLODipine (NORVASC) 10 MG tablet 90 tablet 1    Sig: Take 1 tablet (10 mg total) by mouth daily.     Cardiovascular: Calcium Channel Blockers 2 Passed - 08/26/2022 11:08 AM      Passed - Last BP in normal range    BP Readings from Last 1 Encounters:  08/05/22 138/82         Passed - Last Heart Rate in normal range    Pulse Readings from Last 1 Encounters:  08/05/22 97         Passed - Valid encounter within last 6 months    Recent Outpatient Visits          2 months ago Essential hypertension   [place], Vernia Buff, NP   10 months ago Essential hypertension   [place], MD   11 months ago Essential hypertension   [place], Deborah B, MD   2 years ago Hospital discharge follow-up   [place], MD      Future Appointments            In 3 weeks Ladell Pier, MD [place]

## 2022-09-19 ENCOUNTER — Ambulatory Visit: Payer: Medicaid Other | Admitting: Internal Medicine

## 2022-09-30 ENCOUNTER — Encounter: Payer: Medicaid Other | Admitting: Physical Medicine and Rehabilitation

## 2022-10-23 ENCOUNTER — Ambulatory Visit: Payer: Medicaid Other | Admitting: Physician Assistant

## 2022-11-13 ENCOUNTER — Encounter: Payer: 59 | Attending: Physical Medicine and Rehabilitation | Admitting: Physical Medicine and Rehabilitation

## 2022-11-13 DIAGNOSIS — M7918 Myalgia, other site: Secondary | ICD-10-CM | POA: Insufficient documentation

## 2022-11-18 ENCOUNTER — Ambulatory Visit: Payer: Medicare Other | Admitting: Physician Assistant

## 2022-11-18 ENCOUNTER — Encounter: Payer: Self-pay | Admitting: Physical Medicine and Rehabilitation

## 2022-11-18 ENCOUNTER — Encounter (HOSPITAL_BASED_OUTPATIENT_CLINIC_OR_DEPARTMENT_OTHER): Payer: 59 | Admitting: Physical Medicine and Rehabilitation

## 2022-11-18 VITALS — BP 103/68 | HR 90 | Temp 98.0°F | Ht 64.0 in | Wt 110.0 lb

## 2022-11-18 DIAGNOSIS — M7918 Myalgia, other site: Secondary | ICD-10-CM | POA: Diagnosis not present

## 2022-11-18 MED ORDER — BACLOFEN 20 MG PO TABS: 20.0000 mg | ORAL_TABLET | Freq: Four times a day (QID) | ORAL | 1 refills | Status: DC

## 2022-11-18 MED ORDER — LIDOCAINE HCL 1 % IJ SOLN: 6.0000 mL | Freq: Once | INTRAMUSCULAR | Status: DC
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
## 2022-11-21 ENCOUNTER — Other Ambulatory Visit (HOSPITAL_COMMUNITY)
Admission: RE | Admit: 2022-11-21 | Discharge: 2022-11-21 | Disposition: A | Discharge status: Home / Self Care | Payer: 59 | Source: Ambulatory Visit | Attending: Internal Medicine | Admitting: Internal Medicine

## 2022-11-21 ENCOUNTER — Ambulatory Visit: Payer: 59 | Attending: Internal Medicine | Admitting: Internal Medicine

## 2022-11-21 VITALS — BP 116/72 | HR 93 | Temp 97.6°F | Ht 64.0 in | Wt 112.0 lb

## 2022-11-21 DIAGNOSIS — G8222 Paraplegia, incomplete: Secondary | ICD-10-CM | POA: Diagnosis not present

## 2022-11-21 DIAGNOSIS — Z01419 Encounter for gynecological examination (general) (routine) without abnormal findings: Secondary | ICD-10-CM | POA: Insufficient documentation

## 2022-11-21 DIAGNOSIS — F17200 Nicotine dependence, unspecified, uncomplicated: Secondary | ICD-10-CM

## 2022-11-21 DIAGNOSIS — Z124 Encounter for screening for malignant neoplasm of cervix: Secondary | ICD-10-CM | POA: Diagnosis not present

## 2022-11-21 DIAGNOSIS — Z79899 Other long term (current) drug therapy: Secondary | ICD-10-CM | POA: Insufficient documentation

## 2022-11-21 DIAGNOSIS — Z1151 Encounter for screening for human papillomavirus (HPV): Secondary | ICD-10-CM | POA: Diagnosis not present

## 2022-11-21 DIAGNOSIS — Z780 Asymptomatic menopausal state: Secondary | ICD-10-CM | POA: Diagnosis not present

## 2022-11-21 DIAGNOSIS — Z803 Family history of malignant neoplasm of breast: Secondary | ICD-10-CM | POA: Diagnosis not present

## 2022-11-21 DIAGNOSIS — B182 Chronic viral hepatitis C: Secondary | ICD-10-CM | POA: Diagnosis not present

## 2022-11-21 DIAGNOSIS — Z23 Encounter for immunization: Secondary | ICD-10-CM | POA: Diagnosis not present

## 2022-11-21 DIAGNOSIS — F17210 Nicotine dependence, cigarettes, uncomplicated: Secondary | ICD-10-CM | POA: Diagnosis not present

## 2022-11-21 DIAGNOSIS — M868X0 Other osteomyelitis, multiple sites: Secondary | ICD-10-CM | POA: Diagnosis not present

## 2022-11-21 DIAGNOSIS — Z2911 Encounter for prophylactic immunotherapy for respiratory syncytial virus (RSV): Secondary | ICD-10-CM

## 2022-11-21 DIAGNOSIS — K861 Other chronic pancreatitis: Secondary | ICD-10-CM | POA: Diagnosis not present

## 2022-11-21 DIAGNOSIS — Z122 Encounter for screening for malignant neoplasm of respiratory organs: Secondary | ICD-10-CM | POA: Diagnosis not present

## 2022-11-21 DIAGNOSIS — Z8619 Personal history of other infectious and parasitic diseases: Secondary | ICD-10-CM | POA: Diagnosis not present

## 2022-11-21 DIAGNOSIS — N319 Neuromuscular dysfunction of bladder, unspecified: Secondary | ICD-10-CM | POA: Insufficient documentation

## 2022-11-21 DIAGNOSIS — R7303 Prediabetes: Secondary | ICD-10-CM | POA: Insufficient documentation

## 2022-11-21 DIAGNOSIS — Z1231 Encounter for screening mammogram for malignant neoplasm of breast: Secondary | ICD-10-CM | POA: Diagnosis not present

## 2022-11-21 MED ORDER — VARENICLINE TARTRATE (STARTER) 0.5 MG X 11 & 1 MG X 42 PO TBPK: ORAL_TABLET | ORAL | 0 refills | Status: DC

## 2022-11-21 MED ORDER — RSVPREF3 VAC RECOMB ADJUVANTED 120 MCG/0.5ML IM SUSR: 0.5000 mL | Freq: Once | INTRAMUSCULAR | 0 refills | Status: AC

## 2022-11-21 NOTE — Progress Notes (Addendum)
Patient ID: SOL ODOR, female    DOB: 1961-07-06  MRN: [id number]  CC: Gynecologic Exam (Pap. Med refills. )   Subjective: Debra Schmidt is a 62 y.o. female who presents for PAP Her concerns today include:  Patient with history of tob dep, preDM,  incomplete paraplegia, L1-L2 discitis with osteomyelitis, intra-spinal abscess s/p laminectomy L1-L2, chronic hep C (cured as of 02/2021), neurogenic bladder, osteomyelitis of right clavicle, acute on chronic pancreatitis/pancreatic divisum   GYN History:  Pt is G2P1 (1 miscarriage) Any hx of abn paps?: yes, had LEEP procedure 32 yrs ago Menses regular or irregular?: menopausal How long does menses last? NA Menstrual flow light or heavy?: NA Method of birth control?:  NA Any vaginal dischg at this time?:  no Dysuria?:  no Any hx of STI?:  no Sexually active with how many partners:  1 female partner Desires STI screen:  no Last MMG: several yrs ago Family hx of uterine, cervical or breast cancer?:   breast CA in materal GM  Tob dep:  smokes 1/2 pk a day.  Smoked since age 50.  Use to smoke 1 pk a day unitl 3 yrs ago. Feels she is ready to quit.  Had quit in past for about 2.5 mths in past while in hospital.  Tried patches in past, makes her feel jittery   Incomplete paraplegia: She continues to be followed by PMR.  She is on baclofen.  Denies any falls recently.  She has history of hep C that has been cured.  Last 2 times RNA quant checked she was undetectable.  HM:  due for MMG, flu shot and RSV vaccines Patient Active Problem List   Diagnosis Date Noted   Benign torticollis 08/27/2021   Myofascial pain dysfunction syndrome 06/18/2021   Chronic pain syndrome 09/13/2020   Multiple lesions on CT of brain and spine 06/23/2020   Chronic pancreatitis (HCC) 06/23/2020   Epidural abscess 06/22/2020   Muscle spasms of both lower extremities 06/06/2020   Abnormal finding on MRI of brain 06/06/2020   Labile blood pressure     Neuropathic pain    History of hypertension    Incomplete paraplegia (HCC) 05/09/2020   Neurogenic bladder 05/09/2020   Neurogenic bowel 05/09/2020   Cigarette smoker 04/24/2020   Status post surgery 04/23/2020     Current Outpatient Medications on File Prior to Visit  Medication Sig Dispense Refill   acetaminophen (TYLENOL) 325 MG tablet Take 2 tablets (650 mg total) by mouth 3 (three) times daily.     amLODipine (NORVASC) 10 MG tablet Take 1 tablet (10 mg total) by mouth daily. 90 tablet 1   baclofen (LIORESAL) 20 MG tablet Take 1 tablet (20 mg total) by mouth 4 (four) times daily. Can take an additional tab 1x/day AS NEEDED as well- for spasticity 450 each 1   calcium citrate (CALCITRATE - DOSED IN MG ELEMENTAL CALCIUM) 950 (200 Ca) MG tablet Take 1 tablet (200 mg of elemental calcium total) by mouth daily.     chlorhexidine (PERIDEX) 0.12 % solution SMARTSIG:By Mouth     docusate sodium (COLACE) 100 MG capsule Take 2 capsules (200 mg total) by mouth daily. 60 capsule 3   magnesium oxide (MAG-OX) 400 (241.3 Mg) MG tablet Take 0.5 tablets (200 mg total) by mouth 2 (two) times daily. 30 tablet 2   Multiple Vitamin (MULTIVITAMIN WITH MINERALS) TABS tablet Take 1 tablet by mouth daily. 100 tablet 1   solifenacin (VESICARE) 5 MG tablet Take  1 tablet (5 mg total) by mouth daily. 90 tablet 3   Current Facility-Administered Medications on File Prior to Visit  Medication Dose Route Frequency Provider Last Rate Last Admin   lidocaine (XYLOCAINE) 1 % (with pres) injection 6 mL  6 mL Other Once Lovorn, Megan, MD        Allergies  Allergen Reactions   Adhesive [Tape]    Penicillins Hives    Tolerated Zosyn and Cefazolin 04/2020    Social History   Socioeconomic History   Marital status: Soil scientist    Spouse name: Not on file   Number of children: Not on file   Years of education: Not on file   Highest education level: Not on file  Occupational History   Not on file  Tobacco Use    Smoking status: Every Day    Packs/day: 0.30    Types: Cigarettes    Passive exposure: Current   Smokeless tobacco: Never  Vaping Use   Vaping Use: Never used  Substance and Sexual Activity   Alcohol use: Not Currently   Drug use: Not Currently   Sexual activity: Not on file  Other Topics Concern   Not on file  Social History Narrative   Not on file   Social Determinants of Health   Financial Resource Strain: Not on file  Food Insecurity: Not on file  Transportation Needs: Not on file  Physical Activity: Not on file  Stress: Not on file  Social Connections: Not on file  Intimate Partner Violence: Not on file    Family History  Problem Relation Age of Onset   Cancer Mother    Cancer Father     Past Surgical History:  Procedure Laterality Date   ACROMIO-CLAVICULAR JOINT REPAIR Right 04/25/2020   Procedure: ACROMIO-CLAVICULAR JOINT REPAIR;  Surgeon: Renette Butters, MD;  Location: [place];  Service: Orthopedics;  Laterality: Right;   I & D EXTREMITY Right 04/25/2020   Procedure: Open debridement R AC joint; Distal clavicle repair;  Surgeon: Renette Butters, MD;  Location: [place];  Service: Orthopedics;  Laterality: Right;   LUMBAR LAMINECTOMY/DECOMPRESSION MICRODISCECTOMY N/A 04/23/2020   Procedure: Lumbar One-Two LAMINECTOMY for Epidural Abscess Evacuation;  Surgeon: Ashok Pall, MD;  Location: [place];  Service: Neurosurgery;  Laterality: N/A;   RESECTION DISTAL CLAVICAL Right 04/25/2020   Procedure: RESECTION DISTAL CLAVICAL;  Surgeon: Renette Butters, MD;  Location: [place];  Service: Orthopedics;  Laterality: Right;    ROS: Review of Systems Negative except as stated above  PHYSICAL EXAM: BP 116/72 (BP Location: Left Arm, Patient Position: Sitting, Cuff Size: Normal)   Pulse 93   Temp 97.6 F (36.4 C) (Oral)   Ht 5\' 4"  (1.626 m)   Wt 112 lb (50.8 kg)   SpO2 95%   BMI 19.22 kg/m   Physical Exam  General appearance - alert, well appearing, and in no  distress Mental status - normal mood, behavior, speech, dress, motor activity, and thought processes Chest - clear to auscultation, no wheezes, rales or rhonchi, symmetric air entry Heart - normal rate, regular rhythm, normal S1, S2, no murmurs, rubs, clicks or gallops Breasts -patient declined breast exam Pelvic -CMA Clarissa present: Normal external genitalia, vulva, vagina, cervix, uterus and adnexa.  Small rectocele.      Latest Ref Rng & Units 06/24/2022    2:36 PM 10/18/2021    2:14 PM 09/06/2021    4:06 PM  CMP  Glucose 70 - 99 mg/dL 95  109  113   BUN 8 - 27 mg/dL 9  13  8    Creatinine 0.57 - 1.00 mg/dL  1.32  4.40   Sodium 134 - 144 mmol/L 144  139  144   Potassium 3.5 - 5.2 mmol/L 4.1  4.7  4.0   Chloride 96 - 106 mmol/L 102  95  101   CO2 20 - 29 mmol/L 27  27  29    Calcium 8.7 - 10.3 mg/dL 9.1  1.02    Total Protein 6.0 - 8.5 g/dL 6.8   7.5   Total Bilirubin 0.0 - 1.2 mg/dL 0.2   0.5   Alkaline Phos 44 - 121 IU/L 104   124   AST 0 - 40 IU/L 25   19   ALT 0 - 32 IU/L 22   15    Lipid Panel     Component Value Date/Time   CHOL 148 09/06/2021 1606   TRIG 59 09/06/2021 1606   HDL 54 09/06/2021 1606   CHOLHDL 2.7 09/06/2021 1606   CHOLHDL 7.5 04/29/2020 1722   VLDL 38 04/29/2020 1722   LDLCALC 82 09/06/2021 1606    CBC    Component Value Date/Time   WBC 5.6 09/06/2021 1606   WBC 7.6 06/22/2020 0955   RBC 4.53 09/06/2021 1606   RBC 5.20 (H) 06/22/2020 0955   HGB 13.8 09/06/2021 1606   HCT 40.0 09/06/2021 1606   PLT 224 09/06/2021 1606   MCV 88 09/06/2021 1606   MCH 30.5 09/06/2021 1606   MCH 27.9 06/22/2020 0955   MCHC 34.5 09/06/2021 1606   MCHC 32.3 06/22/2020 0955   RDW 13.3 09/06/2021 1606   LYMPHSABS 1,794 06/22/2020 0955   MONOABS 0.4 05/29/2020 0346   EOSABS 350 06/22/2020 0955   BASOSABS 91 06/22/2020 0955    ASSESSMENT AND PLAN:  1. Pap smear for cervical cancer screening - Cytology - PAP  2. Encounter for screening mammogram  for malignant neoplasm of breast - MM Digital Screening; Future  3. Tobacco dependence Pt is current smoker. Patient advised to quit smoking. Discussed health risks associated with smoking including lung and other types of cancers, chronic lung diseases and CV risks.. Pt ready to give trail of quitting.   Discussed methods to help quit including quitting cold 08/22/2020, use of NRT, Chantix and Bupropion.  Pt wanting to try: Chantix.  Went over with her how the medication works and possible side effects including bad dreams and mood swings.  We will begin with the starter pack.  Once she is almost done with the starter pack she will let me know so I can send prescription for the continuation pack.  She expressed understanding. _3_ Minutes spent on counseling. F/U: Reassess progress on subsequent visit. - Varenicline Tartrate, Starter, (CHANTIX STARTING MONTH PAK) 0.5 MG X 11 & 1 MG X 42 TBPK; 0.5 mg PO daily x 3 days then 0.5mg  BID x 4 days then 1 mg PO BID  Dispense: 1 each; Refill: 0  4. Incomplete paraplegia (HCC) Stable. Continue follow-up with PMR.  5. Hepatitis C virus infection cured after antiviral drug therapy Cured.  6. Need for immunization against influenza - Flu Vaccine QUAD 19mo+IM (Fluarix, Fluzone & Alfiuria Quad PF)  7. Need for RSV vaccination Printed prescription given for RSV vaccine.  She will take it downstairs to our pharmacy to receive the vaccine provided it is covered by her insurance. - RSV vaccine recomb adjuvanted (AREXVY) 120 MCG/0.5ML injection; Inject 0.5 mLs into  the muscle once for 1 dose.  Dispense: 0.5 mL; Refill: 0  8. Screening for lung cancer Patient has more than 20-pack-year smoking history.  I discussed recommendation for lung cancer screening.  She is agreeable to being screened. - CT CHEST LUNG CA SCREEN LOW DOSE W/O CM; Future    Patient was given the opportunity to ask questions.  Patient verbalized understanding of the plan and was able to  repeat key elements of the plan.   This documentation was completed using Radio producer.  Any transcriptional errors are unintentional.  Orders Placed This Encounter  Procedures   MM Digital Screening   CT CHEST LUNG CA SCREEN LOW DOSE W/O CM   Flu Vaccine QUAD 19mo+IM (Fluarix, Fluzone & Alfiuria Quad PF)     Requested Prescriptions   Signed Prescriptions Disp Refills   RSV vaccine recomb adjuvanted (AREXVY) 120 MCG/0.5ML injection 0.5 mL 0    Sig: Inject 0.5 mLs into the muscle once for 1 dose.   Varenicline Tartrate, Starter, (CHANTIX STARTING MONTH PAK) 0.5 MG X 11 & 1 MG X 42 TBPK 1 each 0    Sig: 0.5 mg PO daily x 3 days then 0.5mg  BID x 4 days then 1 mg PO BID    Return in about 6 weeks (around 01/02/2023) for Welcome to Medicare Visit with me in-person, AWV.  Karle Plumber, MD, FACP

## 2022-11-21 NOTE — Patient Instructions (Signed)
I have sent the prescription to your pharmacy for the Chantix starter pack to help with smoking cessation.  I have given you the prescription for the RSV vaccine.  Please stop downstairs at our pharmacy on the first floor to get the vaccine.

## 2022-11-25 LAB — CYTOLOGY - PAP
Comment: NEGATIVE
Diagnosis: NEGATIVE
High risk HPV: NEGATIVE

## 2022-12-05 ENCOUNTER — Other Ambulatory Visit: Payer: Self-pay | Admitting: Internal Medicine

## 2022-12-05 DIAGNOSIS — Z1231 Encounter for screening mammogram for malignant neoplasm of breast: Secondary | ICD-10-CM

## 2022-12-05 DIAGNOSIS — Z23 Encounter for immunization: Secondary | ICD-10-CM

## 2022-12-05 DIAGNOSIS — Z124 Encounter for screening for malignant neoplasm of cervix: Secondary | ICD-10-CM

## 2022-12-05 DIAGNOSIS — G8222 Paraplegia, incomplete: Secondary | ICD-10-CM

## 2022-12-05 DIAGNOSIS — Z8619 Personal history of other infectious and parasitic diseases: Secondary | ICD-10-CM

## 2022-12-05 DIAGNOSIS — Z2911 Encounter for prophylactic immunotherapy for respiratory syncytial virus (RSV): Secondary | ICD-10-CM

## 2022-12-05 DIAGNOSIS — Z122 Encounter for screening for malignant neoplasm of respiratory organs: Secondary | ICD-10-CM

## 2022-12-05 DIAGNOSIS — F17200 Nicotine dependence, unspecified, uncomplicated: Secondary | ICD-10-CM

## 2022-12-06 ENCOUNTER — Inpatient Hospital Stay: Admission: RE | Admit: 2022-12-06 | Payer: 59 | Source: Ambulatory Visit

## 2022-12-25 ENCOUNTER — Ambulatory Visit: Payer: Medicaid Other | Admitting: Physical Medicine and Rehabilitation

## 2023-01-02 ENCOUNTER — Encounter: Payer: 59 | Admitting: Internal Medicine

## 2023-01-20 ENCOUNTER — Encounter: Payer: 59 | Admitting: Physical Medicine and Rehabilitation

## 2023-01-27 ENCOUNTER — Encounter: Payer: Self-pay | Admitting: Physical Medicine and Rehabilitation

## 2023-01-27 ENCOUNTER — Encounter: Payer: 59 | Attending: Physical Medicine and Rehabilitation | Admitting: Physical Medicine and Rehabilitation

## 2023-01-27 VITALS — BP 113/75 | HR 97 | Ht 64.0 in | Wt 106.6 lb

## 2023-01-27 DIAGNOSIS — M436 Torticollis: Secondary | ICD-10-CM | POA: Diagnosis not present

## 2023-01-27 DIAGNOSIS — M7918 Myalgia, other site: Secondary | ICD-10-CM | POA: Diagnosis not present

## 2023-01-27 DIAGNOSIS — R252 Cramp and spasm: Secondary | ICD-10-CM | POA: Diagnosis not present

## 2023-01-27 DIAGNOSIS — G8222 Paraplegia, incomplete: Secondary | ICD-10-CM | POA: Diagnosis not present

## 2023-01-27 MED ORDER — LIDOCAINE HCL 1 % IJ SOLN
6.0000 mL | Freq: Once | INTRAMUSCULAR | Status: AC
  Administered 2023-01-27: 6 mL

## 2023-01-27 NOTE — Progress Notes (Signed)
Patient  Is a 62 yr old female with history of tob dep, , incomplete paraplegia, L1-L2 discitis with osteomyelitis, intra-spinal abscess s/p laminectomy L1-L2, chronic hep C, neurogenic bladder, osteomyelitis of right clavicle,has some spasticity for some reason?Also cervical dystonia- forming-  S/p IV ABX as of 06/03/20 Is an incomplete paraplegic due to osteomyelitis- has developing torticollis of neck.  Here for f/u on SCI and trigger point injections.      Things doing well- calmer   Got PAP smear and everything OK.    Son situation has calmed down- ended up going to jail GF also went to jail- arrested for breaking trespassing and like restraining order.   No other issues except a little sore, because was working in yard- uses stool or knee pads to get down to garden.    Ready for trigger point injections.    Plan: Didn't get good results from Botox- over response- took away all tone- but felt tight and needed soft collar to keep head upright- so would rather get trigger point injections.   2. Patient here for trigger point injections for forming cervical dystonia  Consent done and on chart.  Cleaned areas with alcohol and injected using a 27 gauge 1.5 inch needle  Injected 4.5 cc- wasted 1.5 cc Using 1% Lidocaine with no EPI  Upper traps B/L  Levators B/L  Posterior scalenes Middle scalenes- B/L  Splenius Capitus- B/L  Pectoralis Major- B/L  Rhomboids Infraspinatus Teres Major/minor Thoracic paraspinals Lumbar paraspinals Other injections-   Sitting up better and head not as much to Right as when came in-   Patient's level of pain prior was 7/10 Current level of pain after injections is- dropped to 5/10  There was no bleeding or complications.  Patient was advised to drink a lot of water on day after injections to flush system Will have increased soreness for 12-48 hours after injections.  Can use Lidocaine patches the day AFTER injections Can use theracane  on day of injections in places didn't inject Can use heating pad 4-6 hours AFTER injections  3. Has refills on baclofen- doesn't need today- will con't    4. F/U- in 6 weeks- trP injections and f/u on spasticity.

## 2023-01-27 NOTE — Patient Instructions (Signed)
Plan: Didn't get good results from Botox- over response- took away all tone- but felt tight and needed soft collar to keep head upright- so would rather get trigger point injections.   2. Patient here for trigger point injections for forming cervical dystonia  Consent done and on chart.  Cleaned areas with alcohol and injected using a 27 gauge 1.5 inch needle  Injected 4.5 cc- wasted 1.5 cc Using 1% Lidocaine with no EPI  Upper traps B/L  Levators B/L  Posterior scalenes Middle scalenes- B/L  Splenius Capitus- B/L  Pectoralis Major- B/L  Rhomboids Infraspinatus Teres Major/minor Thoracic paraspinals Lumbar paraspinals Other injections-   Sitting up better and head not as much to Right as when came in-   Patient's level of pain prior was 7/10 Current level of pain after injections is- dropped to 5/10  There was no bleeding or complications.  Patient was advised to drink a lot of water on day after injections to flush system Will have increased soreness for 12-48 hours after injections.  Can use Lidocaine patches the day AFTER injections Can use theracane on day of injections in places didn't inject Can use heating pad 4-6 hours AFTER injections  3. Has refills on baclofen- doesn't need today- will con't    4. F/U- in 6 weeks- trP injections and f/u on spasticity.

## 2023-02-10 ENCOUNTER — Telehealth: Payer: Self-pay | Admitting: Internal Medicine

## 2023-02-10 NOTE — Telephone Encounter (Signed)
Contacted Ouita L Alumbaugh to schedule their annual wellness visit. Welcome to Medicare visit Due by 08/12/23.  [employer] AWV direct phone # [phone]    WTM before 08/12/23 per [employer]

## 2023-02-24 ENCOUNTER — Ambulatory Visit: Payer: 59 | Admitting: Physical Medicine and Rehabilitation

## 2023-02-24 ENCOUNTER — Other Ambulatory Visit: Payer: Self-pay | Admitting: Internal Medicine

## 2023-02-25 NOTE — Telephone Encounter (Signed)
Requested Prescriptions  Pending Prescriptions Disp Refills   amLODipine (NORVASC) 10 MG tablet [Pharmacy Med Name: AMLODIPINE BESYLATE 10 MG TAB] 90 tablet 1    Sig: TAKE 1 TABLET BY MOUTH EVERY DAY     Cardiovascular: Calcium Channel Blockers 2 Passed - 02/24/2023  2:27 AM      Passed - Last BP in normal range    BP Readings from Last 1 Encounters:  01/27/23 113/75         Passed - Last Heart Rate in normal range    Pulse Readings from Last 1 Encounters:  01/27/23 97         Passed - Valid encounter within last 6 months    Recent Outpatient Visits           3 months ago Pap smear for cervical cancer screening   [place] [facility] Marcine Matar, MD   8 months ago Essential hypertension   [place] [facility] [place], Shea Stakes, NP   1 year ago Essential hypertension   [place] [facility] Marcine Matar, MD   1 year ago Essential hypertension   [place] [facility] Marcine Matar, MD   2 years ago Hospital discharge follow-up   [facility] Marcine Matar, MD       Future Appointments             In 3 months Laural Benes Binnie Rail, MD [facility]

## 2023-03-03 ENCOUNTER — Encounter: Payer: Self-pay | Admitting: Physical Medicine and Rehabilitation

## 2023-03-03 ENCOUNTER — Encounter: Payer: 59 | Attending: Physical Medicine and Rehabilitation | Admitting: Physical Medicine and Rehabilitation

## 2023-03-03 VITALS — BP 120/71 | HR 75 | Ht 64.0 in | Wt 107.0 lb

## 2023-03-03 DIAGNOSIS — G894 Chronic pain syndrome: Secondary | ICD-10-CM | POA: Insufficient documentation

## 2023-03-03 DIAGNOSIS — R252 Cramp and spasm: Secondary | ICD-10-CM | POA: Diagnosis not present

## 2023-03-03 DIAGNOSIS — G8222 Paraplegia, incomplete: Secondary | ICD-10-CM | POA: Diagnosis not present

## 2023-03-03 DIAGNOSIS — M7918 Myalgia, other site: Secondary | ICD-10-CM

## 2023-03-03 DIAGNOSIS — Z79891 Long term (current) use of opiate analgesic: Secondary | ICD-10-CM | POA: Diagnosis not present

## 2023-03-03 DIAGNOSIS — Z5181 Encounter for therapeutic drug level monitoring: Secondary | ICD-10-CM | POA: Diagnosis not present

## 2023-03-03 DIAGNOSIS — M436 Torticollis: Secondary | ICD-10-CM | POA: Diagnosis not present

## 2023-03-03 MED ORDER — LIDOCAINE HCL 1 % IJ SOLN
6.0000 mL | Freq: Once | INTRAMUSCULAR | Status: AC
  Administered 2023-03-03: 6 mL

## 2023-03-03 NOTE — Patient Instructions (Signed)
Plan: For forming cervical dystonia- pt didn't have good response- to Botox- too much response to Botox.- Patient here for trigger point injections for cervical dystonia  Consent done and on chart.  Cleaned areas with alcohol and injected using a 27 gauge 1.5 inch needle  Injected 6cc- none wasted Using 1% Lidocaine with no EPI  Upper traps B/L x2 Levators B/L  Posterior scalenes Middle scalenes- B/L  Splenius Capitus- B/L  Pectoralis Major Rhomboids- B/L  Infraspinatus Teres Major/minor Thoracic paraspinals Lumbar paraspinals Other injections-    Patient's level of pain prior was 6-7/10 Current level of pain after injections is  down to 4/10- and can keep head up now- which couldn't do when came in.   There was no bleeding or complications.  Patient was advised to drink a lot of water on day after injections to flush system Will have increased soreness for 12-48 hours after injections.  Can use Lidocaine patches the day AFTER injections Can use theracane on day of injections in places didn't inject Can use heating pad 4-6 hours AFTER injections   2. Con't Baclofen for spasticity  3. Discussed pt's son issues- explained every addict needs to find bottom on their own- helping them will slow down that process.    4. F/U in 6 weeks

## 2023-03-03 NOTE — Addendum Note (Signed)
Addended by: Silas Sacramento T on: 03/03/2023 11:54 AM   Modules accepted: Orders

## 2023-03-03 NOTE — Progress Notes (Signed)
Patient  Is a 62 yr old female with history of tob dep, , incomplete paraplegia, L1-L2 discitis with osteomyelitis, intra-spinal abscess s/p laminectomy L1-L2, chronic hep C, neurogenic bladder, osteomyelitis of right clavicle,has some spasticity for some reason?Also cervical dystonia- forming-  S/p IV ABX as of 06/03/20 Is an incomplete paraplegic due to osteomyelitis- has developing torticollis of neck.  Here for f/u on SCI and trigger point injections.          Got disability Finally got backpay- got a car from backpay.   Still taking Baclofen 20 mg 4x/day.     Plan: For forming cervical dystonia- pt didn't have good response- to Botox- too much response to Botox.- Patient here for trigger point injections for cervical dystonia  Consent done and on chart.  Cleaned areas with alcohol and injected using a 27 gauge 1.5 inch needle  Injected 6cc- none wasted Using 1% Lidocaine with no EPI  Upper traps B/L x2 Levators B/L  Posterior scalenes Middle scalenes- B/L  Splenius Capitus- B/L  Pectoralis Major Rhomboids- B/L  Infraspinatus Teres Major/minor Thoracic paraspinals Lumbar paraspinals Other injections-    Patient's level of pain prior was 6-7/10 Current level of pain after injections is  down to 4/10- and can keep head up now- which couldn't do when came in.   There was no bleeding or complications.  Patient was advised to drink a lot of water on day after injections to flush system Will have increased soreness for 12-48 hours after injections.  Can use Lidocaine patches the day AFTER injections Can use theracane on day of injections in places didn't inject Can use heating pad 4-6 hours AFTER injections   2. Con't Baclofen for spasticity  3. Discussed pt's son issues- explained every addict needs to find bottom on their own- helping them will slow down that process.    4. F/U in 6 weeks    I spent a total of  20   minutes on total care today- >50%  coordination of care- due to 12 minutes on appointment and discussing son's hitting bottom and 8 minutes on injections.

## 2023-04-14 ENCOUNTER — Encounter: Payer: Self-pay | Admitting: Physical Medicine and Rehabilitation

## 2023-04-14 ENCOUNTER — Encounter: Payer: 59 | Attending: Physical Medicine and Rehabilitation | Admitting: Physical Medicine and Rehabilitation

## 2023-04-14 VITALS — BP 104/67 | HR 92 | Ht 64.0 in | Wt 107.0 lb

## 2023-04-14 DIAGNOSIS — G8222 Paraplegia, incomplete: Secondary | ICD-10-CM | POA: Insufficient documentation

## 2023-04-14 DIAGNOSIS — M436 Torticollis: Secondary | ICD-10-CM | POA: Diagnosis not present

## 2023-04-14 DIAGNOSIS — F17200 Nicotine dependence, unspecified, uncomplicated: Secondary | ICD-10-CM | POA: Insufficient documentation

## 2023-04-14 DIAGNOSIS — R252 Cramp and spasm: Secondary | ICD-10-CM | POA: Diagnosis not present

## 2023-04-14 DIAGNOSIS — F17210 Nicotine dependence, cigarettes, uncomplicated: Secondary | ICD-10-CM | POA: Diagnosis not present

## 2023-04-14 MED ORDER — LIDOCAINE HCL 1 % IJ SOLN
3.0000 mL | Freq: Once | INTRAMUSCULAR | Status: AC
  Administered 2023-04-14: 3 mL

## 2023-04-14 MED ORDER — VARENICLINE TARTRATE (STARTER) 0.5 MG X 11 & 1 MG X 42 PO TBPK: ORAL_TABLET | ORAL | 0 refills | Status: DC

## 2023-04-14 MED ORDER — BACLOFEN 20 MG PO TABS: 20.0000 mg | ORAL_TABLET | Freq: Four times a day (QID) | ORAL | 1 refills | Status: DC

## 2023-04-14 NOTE — Progress Notes (Signed)
Patient  Is a 62 yr old female with history of tob dep, , incomplete paraplegia, L1-L2 discitis with osteomyelitis, intra-spinal abscess s/p laminectomy L1-L2, chronic hep C, neurogenic bladder, osteomyelitis of right clavicle,has some spasticity for some reason?Also cervical dystonia- forming-  S/p IV ABX as of 06/03/20 Is an incomplete paraplegic due to osteomyelitis- has developing torticollis of neck.  Here for f/u on SCI and trigger point injections.               Woke up at 3am and hasn't been back to sleep yet.   Here for injections. Trigger point injections. Son' and family situation is quiet right now- has been told needs to move out.    Felt like trigger point injections wore off faster this time- has been working in yard a lot more.    Plan:  Patient here for trigger point injections for forming torticollis  Consent done and on chart.  Cleaned areas with alcohol and injected using a 27 gauge 1.5 inch needle  Injected 3 cc none wasted Using 1% Lidocaine with no EPI  Upper traps B/L  Levators- B/L  Posterior scalenes Middle scalenes- B/L  Splenius Capitus- B/L  Pectoralis Major- B/L  Rhomboids Infraspinatus Teres Major/minor Thoracic paraspinals Lumbar paraspinals Other injections-    Patient's level of pain prior was  8/10 Current level of pain after injections is- 6-7/10- gradually improving  a lot of twitch responses in every muscle.  Head tilt still noticeable.  There was no bleeding or complications.  Patient was advised to drink a lot of water on day after injections to flush system Will have increased soreness for 12-48 hours after injections.  Can use Lidocaine patches the day AFTER injections Can use theracane on day of injections in places didn't inject Can use heating pad 4-6 hours AFTER injections  2. Send in refill of Baclofen 20 mg QID  3. Needs refills of Chantix- to stop smoking- sees starter pack from January- never sent Rx.  Will send  another starter pack to stop her from smoking- has failed wellbutrin in past- so would be best choice- the biggest things with Chantix is weird dreams.   4. Smoking cessation discussions- pick a day and stop 2-4 weeks after starting Chantix- and needs to have PCP refill in future for pt.   5. F/U in 6 weeks for trigger point injections- and f/u on SCI.    I spent a total of  28  minutes on total care today- >50% coordination of care- due to 6 minutes doing injections and d/w pt about smoking cessation

## 2023-04-14 NOTE — Patient Instructions (Signed)
Plan:  Patient here for trigger point injections for forming torticollis  Consent done and on chart.  Cleaned areas with alcohol and injected using a 27 gauge 1.5 inch needle  Injected 3 cc none wasted Using 1% Lidocaine with no EPI  Upper traps B/L  Levators- B/L  Posterior scalenes Middle scalenes- B/L  Splenius Capitus- B/L  Pectoralis Major- B/L  Rhomboids Infraspinatus Teres Major/minor Thoracic paraspinals Lumbar paraspinals Other injections-    Patient's level of pain prior was  8/10 Current level of pain after injections is- 6-7/10- gradually improving  a lot of twitch responses in every muscle.  Head tilt still noticeable.  There was no bleeding or complications.  Patient was advised to drink a lot of water on day after injections to flush system Will have increased soreness for 12-48 hours after injections.  Can use Lidocaine patches the day AFTER injections Can use theracane on day of injections in places didn't inject Can use heating pad 4-6 hours AFTER injections  2. Send in refill of Baclofen 20 mg QID  3. Needs refills of Chantix- to stop smoking- sees starter pack from January- never sent Rx.  Will send another starter pack to stop her from smoking- has failed wellbutrin in past- so would be best choice- the biggest things with Chantix is weird dreams.   4. Smoking cessation discussions- pick a day and stop 2-4 weeks after starting Chantix- and needs to have PCP refill in future for pt.   5. F/U in 6 weeks for trigger point injections- and f/u on SCI.

## 2023-05-26 ENCOUNTER — Encounter: Payer: Self-pay | Admitting: Physical Medicine and Rehabilitation

## 2023-05-26 ENCOUNTER — Encounter: Payer: 59 | Attending: Physical Medicine and Rehabilitation | Admitting: Physical Medicine and Rehabilitation

## 2023-05-26 VITALS — BP 136/72 | HR 88 | Ht 64.0 in | Wt 105.8 lb

## 2023-05-26 DIAGNOSIS — M436 Torticollis: Secondary | ICD-10-CM | POA: Diagnosis not present

## 2023-05-26 MED ORDER — BUPROPION HCL ER (SR) 100 MG PO TB12: 100.0000 mg | ORAL_TABLET | Freq: Two times a day (BID) | ORAL | 5 refills | Status: DC

## 2023-05-26 MED ORDER — LIDOCAINE HCL 1 % IJ SOLN
6.0000 mL | Freq: Once | INTRAMUSCULAR | Status: AC
  Administered 2023-05-26: 6 mL

## 2023-05-26 MED ORDER — TRAZODONE HCL 50 MG PO TABS: 25.0000 mg | ORAL_TABLET | Freq: Every day | ORAL | 5 refills | Status: DC

## 2023-05-26 NOTE — Progress Notes (Signed)
Patient  Is a 62 yr old female with history of tob dep, , incomplete paraplegia, L1-L2 discitis with osteomyelitis, intra-spinal abscess s/p laminectomy L1-L2, chronic hep C, neurogenic bladder, osteomyelitis of right clavicle,has some spasticity for some reason?Also cervical dystonia- forming-  S/p IV ABX as of 06/03/20 Is an incomplete paraplegic due to osteomyelitis- has developing torticollis of neck.  Here for f/u on SCI and trigger point injections.          No more than usual- lately.   Son is still a big issue- stealing from them left and right.  Son was told to leave- but refuses to go.  Said if moves, will not take him with them.   Pain is no more than usual, except been more active, so a little more tense/tight.   Cannot sleep because son having homeless people coming into the home regularly.   Cannot sleep either- because people coming in.   Got up to a full pill 2x/day- started getting sick vomiting- had to stop .  Will talk to Dr Laural Benes- PCP about  other meds.   Has cut back- ~ 7 cigarettes/day- was doing 10-15 cigarettes/day prior-    Spasticity doing better, since taking baclofen- sometimes 3 vs 4 tabs/day.  Depends on spasticity- some days only 2x/day.   Plan: Patient here for trigger point injections for  Consent done and on chart.  Cleaned areas with alcohol and injected using a 27 gauge 1.5 inch needle  Injected  4.5cc- wasted 1.5 cc Using 1% Lidocaine with no EPI  Upper traps B/L  Levators B/L  Posterior scalenes Middle scalenes- B/L  Splenius Capitus- B/L  Pectoralis Major- B/L  Rhomboids- B/L  Infraspinatus Teres Major/minor Thoracic paraspinals- B/L   Lumbar paraspinals Other injections-    Patient's level of pain prior was 8/10 more than normal Current level of pain after injections is- more relaxed- down to 6/10 but more relaxed  There was no bleeding or complications.  Patient was advised to drink a lot of water on day after  injections to flush system Will have increased soreness for 12-48 hours after injections.  Can use Lidocaine patches the day AFTER injections Can use theracane on day of injections in places didn't inject Can use heating pad 4-6 hours AFTER injections  2. Con't Baclofen- last refill 04/2023- doesn't need refills right now  3.  Discussed smoking cessation- has cut back by 30-50%- will try Zyban/Wellbutrin 100 mg 2x/day- but breakfast and lunch- can cause weird dreams- don't take after 3pm or won't be able to sleep.   4. Trazodone 25- 50 mg nightly- for sleep-  to try and sleep better- both Wellbutrin AND trazodone can cause vivid dreams, FYI.   5.  F/U in 6 weeks- for Trigger point injections/f/u on SCI.    I spent a total of  34  minutes on total care today- >50% coordination of care- due to  8 minutes on injections- rest discussing smoking cessation  as well sleep and spasticity

## 2023-05-26 NOTE — Patient Instructions (Signed)
Plan: Patient here for trigger point injections for  Consent done and on chart.  Cleaned areas with alcohol and injected using a 27 gauge 1.5 inch needle  Injected  4.5cc- wasted 1.5 cc Using 1% Lidocaine with no EPI  Upper traps B/L  Levators B/L  Posterior scalenes Middle scalenes- B/L  Splenius Capitus- B/L  Pectoralis Major- B/L  Rhomboids- B/L  Infraspinatus Teres Major/minor Thoracic paraspinals- B/L   Lumbar paraspinals Other injections-    Patient's level of pain prior was 8/10 more than normal Current level of pain after injections is- more relaxed- down to 6/10 but more relaxed  There was no bleeding or complications.  Patient was advised to drink a lot of water on day after injections to flush system Will have increased soreness for 12-48 hours after injections.  Can use Lidocaine patches the day AFTER injections Can use theracane on day of injections in places didn't inject Can use heating pad 4-6 hours AFTER injections  2. Con't Baclofen- last refill 04/2023- doesn't need refills right now  3.  Discussed smoking cessation- has cut back by 30-50%- will try Zyban/Wellbutrin 100 mg 2x/day- but breakfast and lunch- can cause weird dreams- don't take after 3pm or won't be able to sleep.   4. Trazodone 25- 50 mg nightly- for sleep-  to try and sleep better- both Wellbutrin AND trazodone can cause vivid dreams, FYI.   5.  F/U in 6 weeks- for Trigger point injections/f/u on SCI.

## 2023-05-27 ENCOUNTER — Other Ambulatory Visit: Payer: Self-pay | Admitting: Internal Medicine

## 2023-06-05 ENCOUNTER — Encounter: Payer: Self-pay | Admitting: Internal Medicine

## 2023-06-05 ENCOUNTER — Ambulatory Visit: Payer: 59 | Admitting: Internal Medicine

## 2023-06-05 VITALS — BP 113/66 | HR 67 | Temp 98.4°F | Ht 64.0 in | Wt 107.0 lb

## 2023-06-05 DIAGNOSIS — I10 Essential (primary) hypertension: Secondary | ICD-10-CM | POA: Diagnosis not present

## 2023-06-05 DIAGNOSIS — Z122 Encounter for screening for malignant neoplasm of respiratory organs: Secondary | ICD-10-CM

## 2023-06-05 DIAGNOSIS — F17200 Nicotine dependence, unspecified, uncomplicated: Secondary | ICD-10-CM

## 2023-06-05 DIAGNOSIS — K861 Other chronic pancreatitis: Secondary | ICD-10-CM

## 2023-06-05 DIAGNOSIS — E1165 Type 2 diabetes mellitus with hyperglycemia: Secondary | ICD-10-CM | POA: Diagnosis not present

## 2023-06-05 DIAGNOSIS — R600 Localized edema: Secondary | ICD-10-CM

## 2023-06-05 DIAGNOSIS — M25561 Pain in right knee: Secondary | ICD-10-CM

## 2023-06-05 LAB — POCT GLYCOSYLATED HEMOGLOBIN (HGB A1C): HbA1c, POC (controlled diabetic range): 6.4 % (ref 0.0–7.0)

## 2023-06-05 LAB — GLUCOSE, POCT (MANUAL RESULT ENTRY): POC Glucose: 184 mg/dl — AB (ref 70–99)

## 2023-06-05 MED ORDER — AMLODIPINE BESYLATE 5 MG PO TABS
5.0000 mg | ORAL_TABLET | Freq: Every day | ORAL | 1 refills | Status: DC
Start: 2023-06-05 — End: 2023-12-03

## 2023-06-05 MED ORDER — HYDROCHLOROTHIAZIDE 12.5 MG PO TABS: 12.5000 mg | ORAL_TABLET | Freq: Every day | ORAL | 1 refills | Status: DC

## 2023-06-05 MED ORDER — METFORMIN HCL 500 MG PO TABS: 500.0000 mg | ORAL_TABLET | Freq: Every day | ORAL | 1 refills | Status: DC

## 2023-06-05 NOTE — Patient Instructions (Signed)
Please go to [facility] imaging on [address] to get the x-ray of your right knee.  Decrease amlodipine to 5 mg daily.  We have added a new diabetes medication called hydrochlorothiazide which will help decrease the swelling in your legs.  Start metformin for the diabetes.  If it causes any diarrhea please let me know.

## 2023-06-05 NOTE — Progress Notes (Signed)
Patient ID: Debra Schmidt, female    DOB: 1961-06-17  MRN: [id number]  CC: Hypertension (HTN & DM. Med refills. Ottis Stain on R knee, swelling, inured it over a mo ago)   Subjective: Debra Schmidt is a 62 y.o. female who presents for chronic ds management Her concerns today include:  Patient with history of tob dep, preDM,  incomplete paraplegia, L1-L2 discitis with osteomyelitis, intra-spinal abscess s/p laminectomy L1-L2, chronic hep C (cured as of 02/2021), neurogenic bladder, osteomyelitis of right clavicle, acute on chronic pancreatitis/pancreatic divisum    HTN:  taking and tolerating Norvasc 10 mg daily Checks BP occasionally at [facility]. No CP/SOB/LE edema  RT knee pain:  c/o swelling and pain in RT knee accidentally hit it on the corner of her table.  Hurts to stand and bend it -now swelling goes and comes depending on how much she is on her feet.    Incomplete paraplegia: Still followed by PMR Dr. Berline Chough.  Gets inj to neck for torticollis Q 6 wks.  Finds it helpful  Chronic Pancreatitis:  no issues recently.  DM: Results for orders placed or performed in visit on 06/05/23  POCT glucose (manual entry)  Result Value Ref Range   POC Glucose 184 (A) 70 - 99 mg/dl  POCT glycosylated hemoglobin (Hb A1C)  Result Value Ref Range   Hemoglobin A1C     HbA1c POC (<> result, manual entry)     HbA1c, POC (prediabetic range)     HbA1c, POC (controlled diabetic range) 6.4 0.0 - 7.0 %  Initially had her as prediabetes.  However 3 years ago patient had A1c that was actually in range for diabetes.  She has never had to be put on medication.  Last A1c 1 year ago was 5.8.  Tob dep: Had meet criteria for lung cancer screening and was agreeable to low-dose CT scan for screening.  Ordered on last visit.  Never called for screening CT Smoking very little now 3-4 cigarettes a day Tried with Chantix but caused N/V.  Dr. Berline Chough switched her to Wellbutrin 1 wk ago.  Tolerating okay so far  Patient  Active Problem List   Diagnosis Date Noted   Spasticity 01/27/2023   Benign torticollis 08/27/2021   Myofascial pain dysfunction syndrome 06/18/2021   Chronic pain syndrome 09/13/2020   Multiple lesions on CT of brain and spine 06/23/2020   Chronic pancreatitis (HCC) 06/23/2020   Epidural abscess 06/22/2020   Muscle spasms of both lower extremities 06/06/2020   Abnormal finding on MRI of brain 06/06/2020   Labile blood pressure    Neuropathic pain    History of hypertension    Incomplete paraplegia (HCC) 05/09/2020   Neurogenic bladder 05/09/2020   Neurogenic bowel 05/09/2020   Cigarette smoker 04/24/2020   Status post surgery 04/23/2020     Current Outpatient Medications on File Prior to Visit  Medication Sig Dispense Refill   acetaminophen (TYLENOL) 325 MG tablet Take 2 tablets (650 mg total) by mouth 3 (three) times daily.     baclofen (LIORESAL) 20 MG tablet Take 1 tablet (20 mg total) by mouth 4 (four) times daily. Can take an additional tab 1x/day AS NEEDED as well- for spasticity 450 each 1   buPROPion ER (WELLBUTRIN SR) 100 MG 12 hr tablet Take 1 tablet (100 mg total) by mouth 2 (two) times daily. Breakfast and lunch- not dinner- don't take after 3pm- 60 tablet 5   calcium citrate (CALCITRATE - DOSED IN MG ELEMENTAL CALCIUM) 950 (  200 Ca) MG tablet Take 1 tablet (200 mg of elemental calcium total) by mouth daily.     docusate sodium (COLACE) 100 MG capsule Take 2 capsules (200 mg total) by mouth daily. 60 capsule 3   magnesium oxide (MAG-OX) 400 (241.3 Mg) MG tablet Take 0.5 tablets (200 mg total) by mouth 2 (two) times daily. 30 tablet 2   Multiple Vitamin (MULTIVITAMIN WITH MINERALS) TABS tablet Take 1 tablet by mouth daily. 100 tablet 1   solifenacin (VESICARE) 5 MG tablet Take 1 tablet (5 mg total) by mouth daily. 90 tablet 3   traZODone (DESYREL) 50 MG tablet Take 0.5-1 tablets (25-50 mg total) by mouth at bedtime. For sleep 30 tablet 5   chlorhexidine (PERIDEX) 0.12 %  solution SMARTSIG:By Mouth (Patient not taking: Reported on 06/05/2023)     Current Facility-Administered Medications on File Prior to Visit  Medication Dose Route Frequency Provider Last Rate Last Admin   lidocaine (XYLOCAINE) 1 % (with pres) injection 6 mL  6 mL Other Once Lovorn, Megan, MD        Allergies  Allergen Reactions   Adhesive [Tape]    Chantix [Varenicline Tartrate] Nausea And Vomiting   Penicillins Hives    Tolerated Zosyn and Cefazolin 04/2020    Social History   Socioeconomic History   Marital status: Media planner    Spouse name: Not on file   Number of children: Not on file   Years of education: Not on file   Highest education level: Not on file  Occupational History   Not on file  Tobacco Use   Smoking status: Every Day    Current packs/day: 0.30    Types: Cigarettes    Passive exposure: Current   Smokeless tobacco: Never  Vaping Use   Vaping status: Never Used  Substance and Sexual Activity   Alcohol use: Not Currently   Drug use: Not Currently   Sexual activity: Not on file  Other Topics Concern   Not on file  Social History Narrative   Not on file   Social Determinants of Health   Financial Resource Strain: Not on file  Food Insecurity: Not on file  Transportation Needs: Not on file  Physical Activity: Not on file  Stress: Not on file  Social Connections: Not on file  Intimate Partner Violence: Not on file    Family History  Problem Relation Age of Onset   Cancer Mother    Cancer Father     Past Surgical History:  Procedure Laterality Date   ACROMIO-CLAVICULAR JOINT REPAIR Right 04/25/2020   Procedure: ACROMIO-CLAVICULAR JOINT REPAIR;  Surgeon: Sheral Apley, MD;  Location: [facility] OR;  Service: Orthopedics;  Laterality: Right;   I & D EXTREMITY Right 04/25/2020   Procedure: Open debridement R AC joint; Distal clavicle repair;  Surgeon: Sheral Apley, MD;  Location: [facility] OR;  Service: Orthopedics;  Laterality: Right;   LUMBAR  LAMINECTOMY/DECOMPRESSION MICRODISCECTOMY N/A 04/23/2020   Procedure: Lumbar One-Two LAMINECTOMY for Epidural Abscess Evacuation;  Surgeon: Coletta Memos, MD;  Location: [facility] OR;  Service: Neurosurgery;  Laterality: N/A;   RESECTION DISTAL CLAVICAL Right 04/25/2020   Procedure: RESECTION DISTAL CLAVICAL;  Surgeon: Sheral Apley, MD;  Location: [facility] OR;  Service: Orthopedics;  Laterality: Right;    ROS: Review of Systems Negative except as stated above  PHYSICAL EXAM: BP 113/66 (BP Location: Left Arm, Patient Position: Sitting, Cuff Size: Normal)   Pulse 67   Temp 98.4 F (36.9 C) (Oral)  Ht 5\' 4"  (1.626 m)   Wt 107 lb (48.5 kg)   SpO2 94%   BMI 18.37 kg/m   Physical Exam  General appearance -patient in NAD.  She ambulates with a wooden cane.  She has curvature of her trunk.  Neck is extended. Mental status - normal mood, behavior, speech, dress, motor activity, and thought processes Chest - clear to auscultation, no wheezes, rales or rhonchi, symmetric air entry Heart - normal rate, regular rhythm, normal S1, S2, no murmurs, rubs, clicks or gallops Musculoskeletal -right knee: She has mild but noticeable swelling of the knee compared to the left.  Slight tenderness on palpation over the kneecap.  Mild discomfort with passive range of motion. Extremities -1+ bilateral lower extremity edema.      Latest Ref Rng & Units 06/24/2022    2:36 PM 10/18/2021    2:14 PM 09/06/2021    4:06 PM  CMP  Glucose 70 - 99 mg/dL 95  782  956   BUN 8 - 27 mg/dL 9  13  8    Creatinine 0.57 - 1.00 mg/dL 2.13  0.86  5.78   Sodium 134 - 144 mmol/L 144  139  144   Potassium 3.5 - 5.2 mmol/L 4.1  4.7  4.0   Chloride 96 - 106 mmol/L 102  95  101   CO2 20 - 29 mmol/L 27  27  29    Calcium 8.7 - 10.3 mg/dL 9.1  46.9  62.9   Total Protein 6.0 - 8.5 g/dL 6.8   7.5   Total Bilirubin 0.0 - 1.2 mg/dL 0.2   0.5   Alkaline Phos 44 - 121 IU/L 104   124   AST 0 - 40 IU/L 25   19   ALT 0 - 32 IU/L 22   15     Lipid Panel     Component Value Date/Time   CHOL 148 09/06/2021 1606   TRIG 59 09/06/2021 1606   HDL 54 09/06/2021 1606   CHOLHDL 2.7 09/06/2021 1606   CHOLHDL 7.5 04/29/2020 1722   VLDL 38 04/29/2020 1722   LDLCALC 82 09/06/2021 1606    CBC    Component Value Date/Time   WBC 5.6 09/06/2021 1606   WBC 7.6 06/22/2020 0955   RBC 4.53 09/06/2021 1606   RBC 5.20 (H) 06/22/2020 0955   HGB 13.8 09/06/2021 1606   HCT 40.0 09/06/2021 1606   PLT 224 09/06/2021 1606   MCV 88 09/06/2021 1606   MCH 30.5 09/06/2021 1606   MCH 27.9 06/22/2020 0955   MCHC 34.5 09/06/2021 1606   MCHC 32.3 06/22/2020 0955   RDW 13.3 09/06/2021 1606   LYMPHSABS 1,794 06/22/2020 0955   MONOABS 0.4 05/29/2020 0346   EOSABS 350 06/22/2020 0955   BASOSABS 91 06/22/2020 0955    ASSESSMENT AND PLAN:  1. Type 2 diabetes mellitus with hyperglycemia, without long-term current use of insulin (HCC) Patient advised to eliminate sugary drinks from the diet, cut back on portion sizes especially of white carbohydrates, eat more white lean meat like chicken [place] and seafood instead of beef or pork and incorporate fresh fruits and vegetables into the diet daily. -Given that her A1c is creeping up.  I recommend starting a low-dose of metformin at 500 mg once a day.  Patient is agreeable to this.  Advised that the medicine can sometimes cause bloating or diarrhea.  Let me know if she develops any diarrhea. - POCT glucose (manual entry) - POCT glycosylated hemoglobin (Hb  A1C) - metFORMIN (GLUCOPHAGE) 500 MG tablet; Take 1 tablet (500 mg total) by mouth daily with breakfast.  Dispense: 90 tablet; Refill: 1 - CBC - Comprehensive metabolic panel - Lipid panel - Microalbumin / creatinine urine ratio  2. Essential hypertension At goal.  However she has bilateral lower extremity edema.  Swelling may be due to Norvasc.  Decrease the dose of Norvasc to 5 mg daily and add low-dose HCTZ instead.  DASH diet discussed and  encouraged. - amLODipine (NORVASC) 5 MG tablet; Take 1 tablet (5 mg total) by mouth daily.  Dispense: 90 tablet; Refill: 1 - hydrochlorothiazide (HYDRODIURIL) 12.5 MG tablet; Take 1 tablet (12.5 mg total) by mouth daily.  Dispense: 90 tablet; Refill: 1  3. Edema of both legs See #2 above.  4. Right knee pain, unspecified chronicity We will start with getting x-rays of the knee.  Advised to use Tylenol as needed for pain.  Further management will be based upon results of the x-rays. - DG Knee Complete 4 Views Right; Future  5. Tobacco dependence Commended her on trying to quit.  She just started Wellbutrin last week.  6. Chronic pancreatitis, unspecified pancreatitis type (HCC) Stable without any recent acute flares.  7. Screening for lung cancer We will resubmit referral for low-dose CT scan for lung cancer screening.  Patient is agreeable to this.   Patient was given the opportunity to ask questions.  Patient verbalized understanding of the plan and was able to repeat key elements of the plan.   This documentation was completed using Paediatric nurse.  Any transcriptional errors are unintentional.  Orders Placed This Encounter  Procedures   DG Knee Complete 4 Views Right   CBC   Comprehensive metabolic panel   Lipid panel   Microalbumin / creatinine urine ratio   POCT glucose (manual entry)   POCT glycosylated hemoglobin (Hb A1C)     Requested Prescriptions   Signed Prescriptions Disp Refills   amLODipine (NORVASC) 5 MG tablet 90 tablet 1    Sig: Take 1 tablet (5 mg total) by mouth daily.   hydrochlorothiazide (HYDRODIURIL) 12.5 MG tablet 90 tablet 1    Sig: Take 1 tablet (12.5 mg total) by mouth daily.   metFORMIN (GLUCOPHAGE) 500 MG tablet 90 tablet 1    Sig: Take 1 tablet (500 mg total) by mouth daily with breakfast.    Return in about 6 months (around 12/06/2023).  Jonah Blue, MD, FACP

## 2023-06-06 LAB — CBC
MCH: 30.7 pg (ref 26.6–33.0)
MCHC: 34.1 g/dL (ref 31.5–35.7)

## 2023-06-06 LAB — COMPREHENSIVE METABOLIC PANEL: BUN/Creatinine Ratio: 22 (ref 12–28)

## 2023-06-25 ENCOUNTER — Ambulatory Visit
Admission: RE | Admit: 2023-06-25 | Discharge: 2023-06-25 | Disposition: A | Discharge status: Home / Self Care | Payer: 59 | Source: Ambulatory Visit | Attending: Internal Medicine | Admitting: Internal Medicine

## 2023-06-25 DIAGNOSIS — Z122 Encounter for screening for malignant neoplasm of respiratory organs: Secondary | ICD-10-CM

## 2023-06-25 DIAGNOSIS — F17210 Nicotine dependence, cigarettes, uncomplicated: Secondary | ICD-10-CM | POA: Diagnosis not present

## 2023-06-29 ENCOUNTER — Other Ambulatory Visit: Payer: Self-pay | Admitting: Internal Medicine

## 2023-07-01 NOTE — Telephone Encounter (Signed)
Refilled 06/05/23 # 90 with 1 refill. Requested Prescriptions  Refused Prescriptions Disp Refills   amLODipine (NORVASC) 10 MG tablet [Pharmacy Med Name: AMLODIPINE BESYLATE 10 MG TAB] 30 tablet 0    Sig: TAKE 1 TABLET BY MOUTH EVERY DAY     Cardiovascular: Calcium Channel Blockers 2 Passed - 06/29/2023  8:53 AM      Passed - Last BP in normal range    BP Readings from Last 1 Encounters:  06/05/23 113/66         Passed - Last Heart Rate in normal range    Pulse Readings from Last 1 Encounters:  06/05/23 67         Passed - Valid encounter within last 6 months    Recent Outpatient Visits           3 weeks ago Type 2 diabetes mellitus with hyperglycemia, without long-term current use of insulin (HCC)   [place] [facility] Jonah Blue B, MD   7 months ago Pap smear for cervical cancer screening   [place] [facility] Marcine Matar, MD   1 year ago Essential hypertension   [facility] [place], Shea Stakes, NP   1 year ago Essential hypertension   [place]  [facility] Marcine Matar, MD   1 year ago Essential hypertension   [place] [facility] Marcine Matar, MD

## 2023-07-02 ENCOUNTER — Telehealth: Payer: Self-pay | Admitting: Internal Medicine

## 2023-07-02 MED ORDER — ROSUVASTATIN CALCIUM 5 MG PO TABS: 5.0000 mg | ORAL_TABLET | Freq: Every day | ORAL | 3 refills | Status: DC

## 2023-07-02 NOTE — Telephone Encounter (Signed)
Phone call takes the patient today to go over the results of the CAT scan of the chest that was done for lung cancer screening.  Patient informed that no suspicious masses seen in the lungs.  However if she does have changes in the lungs attributed to history of smoking.   emphysematous changes and bronchiolitis seen.  Encouraged her to continue to work on trying to quit smoking. Other incidental findings were aortic atherosclerosis and advance coronary calcifications.  Patient's lipid profile on recent office visit last month was normal.  However her ASCVD is 13%.  I explained the significance of this and recommend starting her on statin therapy.  I went over simple side effects of statin therapy including muscle cramps/weakness and drug-induced hepatitis.  Advised patient to stop the medicine and let me know if she develops any increase cramps or weakness.  Baseline LFTs were okay except for mild elevation in alkaline phosphatase.  We will check LFTs on next office visit.  Patient agreeable to starting rosuvastatin.  Prescription sent to her pharmacy.  All questions were answered.

## 2023-07-07 ENCOUNTER — Encounter: Payer: 59 | Attending: Physical Medicine and Rehabilitation | Admitting: Physical Medicine and Rehabilitation

## 2023-07-07 ENCOUNTER — Encounter: Payer: Self-pay | Admitting: Physical Medicine and Rehabilitation

## 2023-07-07 VITALS — BP 126/77 | HR 85 | Ht 64.0 in | Wt 105.0 lb

## 2023-07-07 DIAGNOSIS — R252 Cramp and spasm: Secondary | ICD-10-CM | POA: Diagnosis not present

## 2023-07-07 DIAGNOSIS — G8222 Paraplegia, incomplete: Secondary | ICD-10-CM | POA: Diagnosis not present

## 2023-07-07 DIAGNOSIS — M436 Torticollis: Secondary | ICD-10-CM | POA: Diagnosis not present

## 2023-07-07 DIAGNOSIS — M7918 Myalgia, other site: Secondary | ICD-10-CM | POA: Insufficient documentation

## 2023-07-07 DIAGNOSIS — F17210 Nicotine dependence, cigarettes, uncomplicated: Secondary | ICD-10-CM | POA: Insufficient documentation

## 2023-07-07 MED ORDER — LIDOCAINE HCL 1 % IJ SOLN
6.0000 mL | Freq: Once | INTRAMUSCULAR | Status: AC
Start: 2023-07-07 — End: 2023-07-07
  Administered 2023-07-07: 6 mL

## 2023-07-07 NOTE — Patient Instructions (Signed)
Plan: Have to stop smoking- since developing complications from Smoking/nicotine  stop- since developing COPD/Emphysema- and heart plaques and preventing Diabetes.   2. . Con't Baclofen 20 mg 4x/day- doesn't need refills.   3. Con't Trazodone as needed- doesn't need refills.   4. Con't Wellbutrin for smoking cessation- pick a day and stick to it to stop smoking.  So ex: October first- and that's the day you throw out the cigarettes.   5. Patient here for trigger point injections for  Consent done and on chart.  Cleaned areas with alcohol and injected using a 27 gauge 1.5 inch needle  Injected 4cc- wasted 2 cc Using 1% Lidocaine with no EPI  Upper traps B/L  Levators- B/L Posterior scalenes Middle scalenes- B/L x2 on L Splenius Capitus- B/L Pectoralis Major B/L  Rhomboids B/L  Infraspinatus Teres Major/minor Thoracic paraspinals Lumbar paraspinals Other injections-    Patient's level of pain prior was 5/10 Current level of pain after injections is down to 3-4/10- doesn't feel as much pulling. Relaxes her.   There was no bleeding or complications.  Patient was advised to drink a lot of water on day after injections to flush system Will have increased soreness for 12-48 hours after injections.  Can use Lidocaine patches the day AFTER injections Can use theracane on day of injections in places didn't inject Can use heating pad 4-6 hours AFTER injections  6. F/U in 6 weeks- f/u on SCI and trigger points for cervical dystonia

## 2023-07-07 NOTE — Progress Notes (Signed)
Patient  Is a 62 yr old female with history of tob dep, , incomplete paraplegia, L1-L2 discitis with osteomyelitis, intra-spinal abscess s/p laminectomy L1-L2, chronic hep C, neurogenic bladder, osteomyelitis of right clavicle,has some spasticity for some reason?Also cervical dystonia- forming-  S/p IV ABX as of 06/03/20 Is an incomplete paraplegic due to osteomyelitis- has developing torticollis of neck.  Here for f/u on SCI and trigger point injections.            Doing OK.  Neck is "OK"-   ~ 1 week- 1.5 weeks ago, started acting up- her neck.  It depends on what she's doing.   Took Chantix- didn't go well on it.  Wellbutrin- doing pretty good.  Don't get big craving as much Goes a few hours without thinking about  cigarettes Smoking 7 cigarettes per day now and was smoking ~ 1ppd.  Getting the feeling could actually cut it out completely.   No signs of cancer, but has beginning stages of COPD.  Plaque on heart vessels per pt.  And put on Metformin to slow down DM formation.   Put on another BP pill to stop Fluid collection.  Hydrochlorothiazide.   Trazodone helping- taking intermittently- 3 days/week max-  usually takes 50 mg when takes it.   Spasticity-  baclofen working for spasticity Hasn't had any muscle cramps since started Metformin- started Saturday.     Plan: Have to stop smoking- since developing complications from Smoking/nicotine  stop- since developing COPD/Emphysema- and heart plaques and preventing Diabetes.   2. . Con't Baclofen 20 mg 4x/day- doesn't need refills.   3. Con't Trazodone as needed- doesn't need refills.   4. Con't Wellbutrin for smoking cessation- pick a day and stick to it to stop smoking.  So ex: October first- and that's the day you throw out the cigarettes.   5. Patient here for trigger point injections for  Consent done and on chart.  Cleaned areas with alcohol and injected using a 27 gauge 1.5 inch needle  Injected 4cc- wasted 2  cc Using 1% Lidocaine with no EPI  Upper traps B/L  Levators- B/L Posterior scalenes Middle scalenes- B/L x2 on L Splenius Capitus- B/L Pectoralis Major B/L  Rhomboids B/L  Infraspinatus Teres Major/minor Thoracic paraspinals Lumbar paraspinals Other injections-    Patient's level of pain prior was 5/10 Current level of pain after injections is down to 3-4/10- doesn't feel as much pulling. Relaxes her.   There was no bleeding or complications.  Patient was advised to drink a lot of water on day after injections to flush system Will have increased soreness for 12-48 hours after injections.  Can use Lidocaine patches the day AFTER injections Can use theracane on day of injections in places didn't inject Can use heating pad 4-6 hours AFTER injections  6. F/U in 6 weeks- f/u on SCI and trigger points for cervical dystonia   I spent a total of 29   minutes on total care today- >50% coordination of care- due to  8 minute son injections- rest discussing smoking cessation and how it affecting DM, Cardiac issues and COPD.

## 2023-08-07 ENCOUNTER — Other Ambulatory Visit: Payer: Self-pay | Admitting: Internal Medicine

## 2023-08-08 NOTE — Telephone Encounter (Signed)
Requested Prescriptions  Refused Prescriptions Disp Refills   amLODipine (NORVASC) 10 MG tablet [Pharmacy Med Name: AMLODIPINE BESYLATE 10 MG TAB] 30 tablet 0    Sig: TAKE 1 TABLET BY MOUTH EVERY DAY     Cardiovascular: Calcium Channel Blockers 2 Passed - 08/07/2023 11:28 PM      Passed - Last BP in normal range    BP Readings from Last 1 Encounters:  07/07/23 126/77         Passed - Last Heart Rate in normal range    Pulse Readings from Last 1 Encounters:  07/07/23 85         Passed - Valid encounter within last 6 months    Recent Outpatient Visits           2 months ago Type 2 diabetes mellitus with hyperglycemia, without long-term current use of insulin (HCC)   [place] [facility] Jonah Blue B, MD   8 months ago Pap smear for cervical cancer screening   [place] [facility] Marcine Matar, MD   1 year ago Essential hypertension   [facility] [place], Shea Stakes, NP   1 year ago Essential hypertension   [place] [facility] Marcine Matar, MD   1 year ago Essential hypertension   [place] [facility] Marcine Matar, MD       Future Appointments             In 1 month Laural Benes, Binnie Rail, MD [facility]

## 2023-08-18 ENCOUNTER — Encounter: Payer: Self-pay | Admitting: Physical Medicine and Rehabilitation

## 2023-08-18 ENCOUNTER — Encounter: Payer: 59 | Attending: Physical Medicine and Rehabilitation | Admitting: Physical Medicine and Rehabilitation

## 2023-08-18 VITALS — BP 128/78 | HR 89 | Ht 64.0 in | Wt 104.0 lb

## 2023-08-18 DIAGNOSIS — G8222 Paraplegia, incomplete: Secondary | ICD-10-CM | POA: Insufficient documentation

## 2023-08-18 DIAGNOSIS — F17210 Nicotine dependence, cigarettes, uncomplicated: Secondary | ICD-10-CM | POA: Insufficient documentation

## 2023-08-18 DIAGNOSIS — M436 Torticollis: Secondary | ICD-10-CM | POA: Diagnosis not present

## 2023-08-18 DIAGNOSIS — R252 Cramp and spasm: Secondary | ICD-10-CM | POA: Insufficient documentation

## 2023-08-18 DIAGNOSIS — M7918 Myalgia, other site: Secondary | ICD-10-CM | POA: Insufficient documentation

## 2023-08-18 MED ORDER — LIDOCAINE HCL 1 % IJ SOLN
6.0000 mL | Freq: Once | INTRAMUSCULAR | Status: DC
Start: 2023-08-18 — End: 2023-11-10

## 2023-08-18 NOTE — Progress Notes (Signed)
Patient  Is a 62 yr old female with history of tob dep, , incomplete paraplegia, L1-L2 discitis with osteomyelitis, intra-spinal abscess s/p laminectomy L1-L2, chronic hep C, neurogenic bladder, osteomyelitis of right clavicle,has some spasticity for some reason?Also cervical dystonia- forming-  S/p IV ABX as of 06/03/20 Is an incomplete paraplegic due to osteomyelitis- has developing torticollis of neck.  Here for f/u on SCI and trigger point injections.              Leaning to Right a lot more for past week to 10 days. Maybe due to worsening stress.  Hard to turn neck and lift up neck because tightness on back of neck- mainly on Right.   Doing pretty well on Wellbutrin- still smokes off and on- just 'wants one".  Doesn't crave as much on it Cup of coffee makes her want a cigarette.   Husband still smokes-  maybe 3 cigarettes/day- not that much.   Trazodone -uses ~2x/week- sometimes, and can even cut in half- usually uses half dose.  But works.   Has had some vivid dreams with Wellbutrin- dreams "all kinds of stuff". Not bad dreams.   Husband had Flu -was wheezing- out of work 3 days.   Spasticity is OK- but acts up with overdoing.  Gets cramps in legs when first wakes up.     Plan: Our goal is to get to Zero cigarettes/day due to beginnings of COPD and cardiac issues. So will con't Wellbutrin- tolerating well.    2.  Suggest gum or hard candy to deal with oral fixation.    3. Con't Baclofen- won't need to refill until next visit.    4. Needs to get flu shot- we discussed it.    5. Patient here for trigger point injections for  Consent done and on chart.  Cleaned areas with alcohol and injected using a 27 gauge 1.5 inch needle  Injected 4.5 cc- 1.5 cc wasted Using 1% Lidocaine with no EPI  Upper traps B/L and 2x on R Levators- B/L and 3x on R Posterior scalenes R only Middle scalenes- B/L and 3x on R Splenius Capitus- 3x on R; 1x on L Pectoralis  Major Rhomboids Infraspinatus Teres Major/minor Thoracic paraspinals Lumbar paraspinals Other injections-    Patient's level of pain prior was 10/10 Current level of pain after injections is going down- 7/10 right now  There was no bleeding or complications.  Patient was advised to drink a lot of water on day after injections to flush system Will have increased soreness for 12-48 hours after injections.  Can use Lidocaine patches the day AFTER injections Can use theracane on day of injections in places didn't inject Can use heating pad 4-6 hours AFTER injections  6. Con't Trazodone as needed- has refills.   7. F/U q6 weeks-for TrP injections and f/u on SCI    I spent a total of 30   minutes on total care today- >50% coordination of care- due to education on smoking cessation-  6 minutes on injections- rest on discussing smoking cessation, pain; spasticity and oral fixation-

## 2023-08-18 NOTE — Patient Instructions (Signed)
Plan: Our goal is to get to Zero cigarettes/day due to beginnings of COPD and cardiac issues. So will con't Wellbutrin- tolerating well.    2.  Suggest gum or hard candy to deal with oral fixation.    3. Con't Baclofen- won't need to refill until next visit.    4. Needs to get flu shot- we discussed it.    5. Patient here for trigger point injections for  Consent done and on chart.  Cleaned areas with alcohol and injected using a 27 gauge 1.5 inch needle  Injected 4.5 cc- 1.5 cc wasted Using 1% Lidocaine with no EPI  Upper traps B/L and 2x on R Levators- B/L and 3x on R Posterior scalenes R only Middle scalenes- B/L and 3x on R Splenius Capitus- 3x on R; 1x on L Pectoralis Major Rhomboids Infraspinatus Teres Major/minor Thoracic paraspinals Lumbar paraspinals Other injections-    Patient's level of pain prior was 10/10 Current level of pain after injections is going down- 7/10 right now  There was no bleeding or complications.  Patient was advised to drink a lot of water on day after injections to flush system Will have increased soreness for 12-48 hours after injections.  Can use Lidocaine patches the day AFTER injections Can use theracane on day of injections in places didn't inject Can use heating pad 4-6 hours AFTER injections  6. Con't Trazodone as needed- has refills.   7. F/U q6 weeks-for TrP injections and f/u on SCI

## 2023-08-19 ENCOUNTER — Ambulatory Visit: Payer: 59

## 2023-09-16 ENCOUNTER — Ambulatory Visit: Payer: 59 | Attending: Internal Medicine

## 2023-09-16 VITALS — Ht 64.0 in | Wt 107.0 lb

## 2023-09-16 DIAGNOSIS — Z0000 Encounter for general adult medical examination without abnormal findings: Secondary | ICD-10-CM | POA: Diagnosis not present

## 2023-09-16 DIAGNOSIS — Z1231 Encounter for screening mammogram for malignant neoplasm of breast: Secondary | ICD-10-CM

## 2023-09-16 DIAGNOSIS — Z0100 Encounter for examination of eyes and vision without abnormal findings: Secondary | ICD-10-CM

## 2023-09-16 NOTE — Patient Instructions (Addendum)
Debra Schmidt , Thank you for taking time to come for your Medicare Wellness Visit. I appreciate your ongoing commitment to your health goals. Please review the following plan we discussed and let me know if I can assist you in the future.   Next Medicare Annual Wellness Visit: November 11,2025 at 10:40 am. This will  be a video visit.   Referrals/Orders/Follow-Ups/Clinician Recommendations:  You have an order for:  []   2D Mammogram  [x]   3D Mammogram  []   Bone Density     Please call for appointment:   [facility] [address] [place], [place] [address] [phone]  Make sure to wear two-piece clothing.  No lotions powders or deodorants the day of the appointment Make sure to bring picture ID and insurance card.  Bring list of medications you are currently taking including any supplements.   Schedule your [place] screening mammogram through MyChart!   Log into your MyChart account.  Go to 'Visit' (or 'Appointments' if on mobile App) --> Schedule an Appointment  Under 'Select a Reason for Visit' choose the Mammogram Screening option.  Complete the pre-visit questions and select the time and place that best fits your schedule.  You've been referred to see Dr. Sinda Du for an eye exam. If you haven't heard from his office in about a week, please call to schedule your appointment.   Sinda Du, MD [address] [place] [address]  Phone: [phone]   This is a list of the screening recommended for you and due dates:  Health Maintenance  Topic Date Due   DTaP/Tdap/Td vaccine (1 - Tdap) Never done   Mammogram  Never done   Flu Shot  06/12/2023   COVID-19 Vaccine (3 - 2023-24 season) 07/13/2023   Yearly kidney function blood test for diabetes  06/04/2024   Yearly kidney health urinalysis for diabetes  06/04/2024   Medicare Annual Wellness Visit  09/15/2024   Cologuard (Stool DNA test)  12/10/2024   Pap with HPV screening  11/22/2027    Hepatitis C Screening  Completed   HIV Screening  Completed   Zoster (Shingles) Vaccine  Completed   HPV Vaccine  Aged Out    Advanced directives: (Declined) Advance directive discussed with you today. Even though you declined this today, please call our office should you change your mind, and we can give you the proper paperwork for you to fill out.  Next Medicare Annual Wellness Visit scheduled for next year: Yes  Understanding Your Risk for Falls Millions of people have serious injuries from falls each year. It is important to understand your risk of falling. Talk with your health care provider about your risk and what you can do to lower it. If you do have a serious fall, make sure to tell your provider. Falling once raises your risk of falling again. How can falls affect me? Serious injuries from falls are common. These include: Broken bones, such as hip fractures. Head injuries, such as traumatic brain injuries (TBI) or concussions. A fear of falling can cause you to avoid activities and stay at home. This can make your muscles weaker and raise your risk for a fall. What can increase my risk? There are a number of risk factors that increase your risk for falling. The more risk factors you have, the higher your risk of falling. Serious injuries from a fall happen most often to people who are older than 63 years old. Teenagers and young adults  ages 15-29 are also at higher risk. Common risk factors include: Weakness in the lower body. Being generally weak or confused due to long-term (chronic) illness. Dizziness or balance problems. Poor vision. Medicines that cause dizziness or drowsiness. These may include: Medicines for your blood pressure, heart, anxiety, insomnia, or swelling (edema). Pain medicines. Muscle relaxants. Other risk factors include: Drinking alcohol. Having had a fall in the past. Having foot pain or wearing improper footwear. Working at a dangerous  job. Having any of the following in your home: Tripping hazards, such as floor clutter or loose rugs. Poor lighting. Pets. Having dementia or memory loss. What actions can I take to lower my risk of falling?     Physical activity Stay physically fit. Do strength and balance exercises. Consider taking a regular class to build strength and balance. Yoga and tai chi are good options. Vision Have your eyes checked every year and your prescription for glasses or contacts updated as needed. Shoes and walking aids Wear non-skid shoes. Wear shoes that have rubber soles and low heels. Do not wear high heels. Do not walk around the house in socks or slippers. Use a cane or walker as told by your provider. Home safety Attach secure railings on both sides of your stairs. Install grab bars for your bathtub, shower, and toilet. Use a non-skid mat in your bathtub or shower. Attach bath mats securely with double-sided, non-slip rug tape. Use good lighting in all rooms. Keep a flashlight near your bed. Make sure there is a clear path from your bed to the bathroom. Use night-lights. Do not use throw rugs. Make sure all carpeting is taped or tacked down securely. Remove all clutter from walkways and stairways, including extension cords. Repair uneven or broken steps and floors. Avoid walking on icy or slippery surfaces. Walk on the grass instead of on icy or slick sidewalks. Use ice melter to get rid of ice on walkways in the winter. Use a cordless phone. Questions to ask your health care provider Can you help me check my risk for a fall? Do any of my medicines make me more likely to fall? Should I take a vitamin D supplement? What exercises can I do to improve my strength and balance? Should I make an appointment to have my vision checked? Do I need a bone density test to check for weak bones (osteoporosis)? Would it help to use a cane or a walker? Where to find more information Centers for  Disease Control and Prevention, [employer]: TonerPromos.no Community-Based Fall Prevention Programs: TonerPromos.no [employer] on Aging: BaseRingTones.pl Contact a health care provider if: You fall at home. You are afraid of falling at home. You feel weak, drowsy, or dizzy. This information is not intended to replace advice given to you by your health care provider. Make sure you discuss any questions you have with your health care provider. Document Revised: 07/01/2022 Document Reviewed: 07/01/2022 Elsevier Patient Education  2024 [employer].  Preventive Care 39-37 Years Old, Female Preventive care refers to lifestyle choices and visits with your health care provider that can promote health and wellness. Preventive care visits are also called wellness exams. What can I expect for my preventive care visit? Counseling Your health care provider may ask you questions about your: Medical history, including: Past medical problems. Family medical history. Pregnancy history. Current health, including: Menstrual cycle. Method of birth control. Emotional well-being. Home life and relationship well-being. Sexual activity and sexual health. Lifestyle, including: Alcohol, nicotine or tobacco, and  drug use. Access to firearms. Diet, exercise, and sleep habits. Work and work Astronomer. Sunscreen use. Safety issues such as seatbelt and bike helmet use. Physical exam Your health care provider will check your: Height and weight. These may be used to calculate your BMI (body mass index). BMI is a measurement that tells if you are at a healthy weight. Waist circumference. This measures the distance around your waistline. This measurement also tells if you are at a healthy weight and may help predict your risk of certain diseases, such as type 2 diabetes and high blood pressure. Heart rate and blood pressure. Body temperature. Skin for abnormal spots. What immunizations do I need?  Vaccines are usually  given at various ages, according to a schedule. Your health care provider will recommend vaccines for you based on your age, medical history, and lifestyle or other factors, such as travel or where you work. What tests do I need? Screening Your health care provider may recommend screening tests for certain conditions. This may include: Lipid and cholesterol levels. Diabetes screening. This is done by checking your blood sugar (glucose) after you have not eaten for a while (fasting). Pelvic exam and Pap test. Hepatitis B test. Hepatitis C test. HIV (human immunodeficiency virus) test. STI (sexually transmitted infection) testing, if you are at risk. Lung cancer screening. Colorectal cancer screening. Mammogram. Talk with your health care provider about when you should start having regular mammograms. This may depend on whether you have a family history of breast cancer. BRCA-related cancer screening. This may be done if you have a family history of breast, ovarian, tubal, or peritoneal cancers. Bone density scan. This is done to screen for osteoporosis. Talk with your health care provider about your test results, treatment options, and if necessary, the need for more tests. Follow these instructions at home: Eating and drinking  Eat a diet that includes fresh fruits and vegetables, whole grains, lean protein, and low-fat dairy products. Take vitamin and mineral supplements as recommended by your health care provider. Do not drink alcohol if: Your health care provider tells you not to drink. You are pregnant, may be pregnant, or are planning to become pregnant. If you drink alcohol: Limit how much you have to 0-1 drink a day. Know how much alcohol is in your drink. In the [place] one drink equals one 12 oz bottle of beer (355 mL), one 5 oz glass of wine (148 mL), or one 1 oz glass of hard liquor (44 mL). Lifestyle Brush your teeth every morning and night with fluoride toothpaste. Floss one  time each day. Exercise for at least 30 minutes 5 or more days each week. Do not use any products that contain nicotine or tobacco. These products include cigarettes, chewing tobacco, and vaping devices, such as e-cigarettes. If you need help quitting, ask your health care provider. Do not use drugs. If you are sexually active, practice safe sex. Use a condom or other form of protection to prevent STIs. If you do not wish to become pregnant, use a form of birth control. If you plan to become pregnant, see your health care provider for a prepregnancy visit. Take aspirin only as told by your health care provider. Make sure that you understand how much to take and what form to take. Work with your health care provider to find out whether it is safe and beneficial for you to take aspirin daily. Find healthy ways to manage stress, such as: Meditation, yoga, or listening to music. Journaling.  Talking to a trusted person. Spending time with friends and family. Minimize exposure to UV radiation to reduce your risk of skin cancer. Safety Always wear your seat belt while driving or riding in a vehicle. Do not drive: If you have been drinking alcohol. Do not ride with someone who has been drinking. When you are tired or distracted. While texting. If you have been using any mind-altering substances or drugs. Wear a helmet and other protective equipment during sports activities. If you have firearms in your house, make sure you follow all gun safety procedures. Seek help if you have been physically or sexually abused. What's next? Visit your health care provider once a year for an annual wellness visit. Ask your health care provider how often you should have your eyes and teeth checked. Stay up to date on all vaccines. This information is not intended to replace advice given to you by your health care provider. Make sure you discuss any questions you have with your health care provider. Document  Revised: 04/25/2021 Document Reviewed: 04/25/2021 Elsevier Patient Education  2024 [employer].

## 2023-09-16 NOTE — Progress Notes (Signed)
Because this visit was a virtual/telehealth visit,  certain criteria was not obtained, such a blood pressure, CBG if applicable, and timed get up and go. Any medications not marked as "taking" were not mentioned during the medication reconciliation part of the visit. Any vitals not documented were not able to be obtained due to this being a telehealth visit or patient was unable to self-report a recent blood pressure reading due to a lack of equipment at home via telehealth. Vitals that have been documented are verbally provided by the patient.   Subjective:   Debra Schmidt is a 62 y.o. female who presents for an Initial Medicare Annual Wellness Visit.  Visit Complete: Virtual I connected with  Debra Schmidt on 09/16/23 by a audio enabled telemedicine application and verified that I am speaking with the correct person using two identifiers.  Patient Location: Home  Provider Location: Home Office  I discussed the limitations of evaluation and management by telemedicine. The patient expressed understanding and agreed to proceed.  Vital Signs: Because this visit was a virtual/telehealth visit, some criteria may be missing or patient reported. Any vitals not documented were not able to be obtained and vitals that have been documented are patient reported.  Patient Medicare AWV questionnaire was completed by the patient on na; I have confirmed that all information answered by patient is correct and no changes since this date.  Cardiac Risk Factors include: advanced age (>72men, >10 women);diabetes mellitus;dyslipidemia;hypertension;smoking/ tobacco exposure     Objective:    Today's Vitals   09/16/23 1101 09/16/23 1103  Weight: 107 lb (48.5 kg)   Height: 5\' 4"  (1.626 m)   PainSc:  6    Body mass index is 18.37 kg/m.     09/16/2023   11:01 AM 05/09/2020    4:22 PM 04/23/2020    2:54 PM 02/24/2020    5:26 PM  Advanced Directives  Does Patient Have a Medical Advance Directive? No No No  No  Would patient like information on creating a medical advance directive? No - Patient declined No - Patient declined No - Patient declined No - Patient declined    Current Medications (verified) Outpatient Encounter Medications as of 09/16/2023  Medication Sig   acetaminophen (TYLENOL) 325 MG tablet Take 2 tablets (650 mg total) by mouth 3 (three) times daily.   amLODipine (NORVASC) 5 MG tablet Take 1 tablet (5 mg total) by mouth daily.   baclofen (LIORESAL) 20 MG tablet Take 1 tablet (20 mg total) by mouth 4 (four) times daily. Can take an additional tab 1x/day AS NEEDED as well- for spasticity   buPROPion ER (WELLBUTRIN SR) 100 MG 12 hr tablet Take 1 tablet (100 mg total) by mouth 2 (two) times daily. Breakfast and lunch- not dinner- don't take after 3pm-   calcium citrate (CALCITRATE - DOSED IN MG ELEMENTAL CALCIUM) 950 (200 Ca) MG tablet Take 1 tablet (200 mg of elemental calcium total) by mouth daily.   chlorhexidine (PERIDEX) 0.12 % solution    docusate sodium (COLACE) 100 MG capsule Take 2 capsules (200 mg total) by mouth daily.   hydrochlorothiazide (HYDRODIURIL) 12.5 MG tablet Take 1 tablet (12.5 mg total) by mouth daily.   magnesium oxide (MAG-OX) 400 (241.3 Mg) MG tablet Take 0.5 tablets (200 mg total) by mouth 2 (two) times daily.   metFORMIN (GLUCOPHAGE) 500 MG tablet Take 1 tablet (500 mg total) by mouth daily with breakfast.   Multiple Vitamin (MULTIVITAMIN WITH MINERALS) TABS tablet Take 1 tablet  by mouth daily.   rosuvastatin (CRESTOR) 5 MG tablet Take 1 tablet (5 mg total) by mouth daily.   solifenacin (VESICARE) 5 MG tablet Take 1 tablet (5 mg total) by mouth daily.   traZODone (DESYREL) 50 MG tablet Take 0.5-1 tablets (25-50 mg total) by mouth at bedtime. For sleep   Facility-Administered Encounter Medications as of 09/16/2023  Medication   lidocaine (XYLOCAINE) 1 % (with pres) injection 6 mL   lidocaine (XYLOCAINE) 1 % (with pres) injection 6 mL    Allergies  (verified) Adhesive [tape], Chantix [varenicline tartrate], and Penicillins   History: Past Medical History:  Diagnosis Date   Benign torticollis    Chronic pain    Diabetes mellitus without complication (HCC)    Hypertension    Myofascial pain dysfunction syndrome    Neurogenic bladder    Past Surgical History:  Procedure Laterality Date   ACROMIO-CLAVICULAR JOINT REPAIR Right 04/25/2020   Procedure: ACROMIO-CLAVICULAR JOINT REPAIR;  Surgeon: Sheral Apley, MD;  Location: [facility] OR;  Service: Orthopedics;  Laterality: Right;   I & D EXTREMITY Right 04/25/2020   Procedure: Open debridement R AC joint; Distal clavicle repair;  Surgeon: Sheral Apley, MD;  Location: [facility] OR;  Service: Orthopedics;  Laterality: Right;   LUMBAR LAMINECTOMY/DECOMPRESSION MICRODISCECTOMY N/A 04/23/2020   Procedure: Lumbar One-Two LAMINECTOMY for Epidural Abscess Evacuation;  Surgeon: Coletta Memos, MD;  Location: [facility] OR;  Service: Neurosurgery;  Laterality: N/A;   RESECTION DISTAL CLAVICAL Right 04/25/2020   Procedure: RESECTION DISTAL CLAVICAL;  Surgeon: Sheral Apley, MD;  Location: [facility] OR;  Service: Orthopedics;  Laterality: Right;   Family History  Problem Relation Age of Onset   Cancer Mother    Cancer Father    Social History   Socioeconomic History   Marital status: Media planner    Spouse name: Not on file   Number of children: Not on file   Years of education: Not on file   Highest education level: Not on file  Occupational History   Not on file  Tobacco Use   Smoking status: Every Day    Current packs/day: 0.30    Types: Cigarettes    Passive exposure: Current   Smokeless tobacco: Never  Vaping Use   Vaping status: Never Used  Substance and Sexual Activity   Alcohol use: Not Currently   Drug use: Not Currently   Sexual activity: Not on file  Other Topics Concern   Not on file  Social History Narrative   Not on file   Social Determinants of Health   Financial Resource  Strain: High Risk (09/16/2023)   Overall Financial Resource Strain (CARDIA)    Difficulty of Paying Living Expenses: Hard  Food Insecurity: No Food Insecurity (09/16/2023)   Hunger Vital Sign    Worried About Running Out of Food in the Last Year: Never true    Ran Out of Food in the Last Year: Never true  Transportation Needs: No Transportation Needs (09/16/2023)   PRAPARE - Administrator, Civil Service (Medical): No    Lack of Transportation (Non-Medical): No  Physical Activity: Insufficiently Active (09/16/2023)   Exercise Vital Sign    Days of Exercise per Week: 7 days    Minutes of Exercise per Session: 20 min  Stress: Stress Concern Present (09/16/2023)   [employer] of Occupational Health - Occupational Stress Questionnaire    Feeling of Stress : Rather much  Social Connections: Socially Integrated (09/16/2023)   Social Connection and Isolation  Panel [NHANES]    Frequency of Communication with Friends and Family: More than three times a week    Frequency of Social Gatherings with Friends and Family: More than three times a week    Attends Religious Services: More than 4 times per year    Active Member of [employer] or Organizations: Yes    Attends Engineer, structural: More than 4 times per year    Marital Status: Living with partner    Tobacco Counseling Ready to quit: Yes Counseling given: Yes   Clinical Intake:  Pre-visit preparation completed: Yes  Pain : 0-10 Pain Score: 6  Pain Type: Chronic pain Pain Location: Back Pain Orientation: Lower Pain Descriptors / Indicators: Constant, Aching, Spasm, Squeezing Pain Onset: More than a month ago Pain Frequency: Constant     BMI - recorded: 18.37 Nutritional Status: BMI <19  Underweight Nutritional Risks: None Diabetes: Yes CBG done?: No (telehealth visit. unable to obtain cbg) Did pt. bring in CBG monitor from home?: No  How often do you need to have someone help you when you read  instructions, pamphlets, or other written materials from your doctor or pharmacy?: 1 - Never  Interpreter Needed?: No  Information entered by :: Abby Tamarius Rosenfield, CMA   Activities of Daily Living    09/16/2023   11:19 AM  In your present state of health, do you have any difficulty performing the following activities:  Hearing? 0  Vision? 0  Difficulty concentrating or making decisions? 0  Walking or climbing stairs? 1  Dressing or bathing? 0  Doing errands, shopping? 0  Preparing Food and eating ? N  Using the Toilet? N  In the past six months, have you accidently leaked urine? N  Do you have problems with loss of bowel control? N  Managing your Medications? N  Managing your Finances? N  Housekeeping or managing your Housekeeping? N    Patient Care Team: Marcine Matar, MD as PCP - General (Internal Medicine)  Indicate any recent Medical Services you may have received from other than [facility] providers in the past year (date may be approximate).     Assessment:   This is a routine wellness examination for Mallie.  Hearing/Vision screen Hearing Screening - Comments:: Patient denies any hearing difficulties.   Vision Screening - Comments:: A referral was placed for patient today to establish with a new ophthalmology office. Patient in agreement with treatment plan.     Goals Addressed             This Visit's Progress    Patient Stated       To remain as active and independent as possible       Depression Screen    09/16/2023   11:10 AM 08/18/2023   11:00 AM 07/07/2023   10:03 AM 06/05/2023   11:52 AM 05/26/2023    9:56 AM 04/14/2023   10:10 AM 11/21/2022   10:24 AM  PHQ 2/9 Scores  PHQ - 2 Score 0 0 2 0 2 0 0  PHQ- 9 Score 0   1   3    Fall Risk    09/16/2023   11:19 AM 08/18/2023   11:00 AM 07/07/2023   10:03 AM 05/26/2023    9:56 AM 04/14/2023   10:10 AM  Fall Risk   Falls in the past year? 0 0 0 0 0  Number falls in past yr: 0  0 0 0  Injury with Fall? 0   0 0  0  Risk for fall due to : No Fall Risks      Follow up Falls prevention discussed;Education provided        MEDICARE RISK AT HOME: Medicare Risk at Home Any stairs in or around the home?: Yes If so, are there any without handrails?: No Home free of loose throw rugs in walkways, pet beds, electrical cords, etc?: Yes Adequate lighting in your home to reduce risk of falls?: Yes Life alert?: No Use of a cane, walker or w/c?: Yes Grab bars in the bathroom?: Yes Shower chair or bench in shower?: Yes Elevated toilet seat or a handicapped toilet?: Yes  TIMED UP AND GO:  Was the test performed? No    Cognitive Function:        09/16/2023   11:07 AM  6CIT Screen  What Year? 0 points  What month? 0 points  What time? 0 points  Count back from 20 0 points  Months in reverse 0 points  Repeat phrase 0 points  Total Score 0 points    Immunizations Immunization History  Administered Date(s) Administered   Influenza,inj,Quad PF,6+ Mos 09/06/2021, 11/21/2022   PFIZER Comirnaty(Gray Top)Covid-19 Tri-Sucrose Vaccine 10/06/2020, 11/07/2020   PNEUMOCOCCAL CONJUGATE-20 09/06/2021   Zoster Recombinant(Shingrix) 08/10/2021, 06/24/2022    TDAP status: Due, Education has been provided regarding the importance of this vaccine. Advised may receive this vaccine at local pharmacy or Health Dept. Aware to provide a copy of the vaccination record if obtained from local pharmacy or Health Dept. Verbalized acceptance and understanding.  Flu Vaccine status: Due, Education has been provided regarding the importance of this vaccine. Advised may receive this vaccine at local pharmacy or Health Dept. Aware to provide a copy of the vaccination record if obtained from local pharmacy or Health Dept. Verbalized acceptance and understanding.  Pneumococcal vaccine status: Not age appropriate for this patient.    Covid-19 vaccine status: Information provided on how to obtain vaccines.   Qualifies for  Shingles Vaccine? No   Zostavax completed No   Shingrix Completed?: Yes  Screening Tests Health Maintenance  Topic Date Due   Medicare Annual Wellness (AWV)  Never done   DTaP/Tdap/Td (1 - Tdap) Never done   MAMMOGRAM  Never done   INFLUENZA VACCINE  06/12/2023   COVID-19 Vaccine (3 - 2023-24 season) 07/13/2023   Diabetic kidney evaluation - eGFR measurement  06/04/2024   Diabetic kidney evaluation - Urine ACR  06/04/2024   Fecal DNA (Cologuard)  12/10/2024   Cervical Cancer Screening (HPV/Pap Cotest)  11/22/2027   Hepatitis C Screening  Completed   HIV Screening  Completed   Zoster Vaccines- Shingrix  Completed   HPV VACCINES  Aged Out    Health Maintenance  Health Maintenance Due  Topic Date Due   Medicare Annual Wellness (AWV)  Never done   DTaP/Tdap/Td (1 - Tdap) Never done   MAMMOGRAM  Never done   INFLUENZA VACCINE  06/12/2023   COVID-19 Vaccine (3 - 2023-24 season) 07/13/2023    Colorectal cancer screening: Type of screening: Cologuard. Completed 12/10/2021. Repeat every 3 years  Mammogram status: Ordered 09/16/2023. Pt provided with contact info and advised to call to schedule appt.   Bone Density Screening: Not age appropriate for this patient.    Lung Cancer Screening: (Low Dose CT Chest recommended if Age 15-80 years, 20 pack-year currently smoking OR have quit w/in 15years.) does not qualify.   Lung Cancer Screening Referral: na  Additional Screening:  Hepatitis C Screening: does not  qualify; Completed 11/21/2022  Vision Screening: Recommended annual ophthalmology exams for early detection of glaucoma and other disorders of the eye. Is the patient up to date with their annual eye exam?  No  Who is the provider or what is the name of the office in which the patient attends annual eye exams? Referral placed If pt is not established with a provider, would they like to be referred to a provider to establish care? No .   Dental Screening: Recommended annual  dental exams for proper oral hygiene  Diabetic Foot Exam: na  Community Resource Referral / Chronic Care Management: CRR required this visit?  No   CCM required this visit?  No     Plan:     I have personally reviewed and noted the following in the patient's chart:   Medical and social history Use of alcohol, tobacco or illicit drugs  Current medications and supplements including opioid prescriptions. Patient is not currently taking opioid prescriptions. Functional ability and status Nutritional status Physical activity Advanced directives List of other physicians Hospitalizations, surgeries, and ER visits in previous 12 months Vitals Screenings to include cognitive, depression, and falls Referrals and appointments  In addition, I have reviewed and discussed with patient certain preventive protocols, quality metrics, and best practice recommendations. A written personalized care plan for preventive services as well as general preventive health recommendations were provided to patient.     Jordan Hawks Asia Dusenbury, CMA   09/16/2023   After Visit Summary: (MyChart) Due to this being a telephonic visit, the after visit summary with patients personalized plan was offered to patient via MyChart

## 2023-09-29 ENCOUNTER — Encounter: Payer: 59 | Admitting: Physical Medicine and Rehabilitation

## 2023-10-02 ENCOUNTER — Ambulatory Visit: Payer: 59 | Attending: Internal Medicine | Admitting: Internal Medicine

## 2023-10-02 ENCOUNTER — Encounter: Payer: Self-pay | Admitting: Internal Medicine

## 2023-10-02 VITALS — BP 122/75 | HR 90 | Temp 97.9°F | Ht 64.0 in | Wt 103.0 lb

## 2023-10-02 DIAGNOSIS — F17200 Nicotine dependence, unspecified, uncomplicated: Secondary | ICD-10-CM | POA: Diagnosis not present

## 2023-10-02 DIAGNOSIS — E119 Type 2 diabetes mellitus without complications: Secondary | ICD-10-CM

## 2023-10-02 DIAGNOSIS — Z7984 Long term (current) use of oral hypoglycemic drugs: Secondary | ICD-10-CM | POA: Diagnosis not present

## 2023-10-02 DIAGNOSIS — Z23 Encounter for immunization: Secondary | ICD-10-CM | POA: Diagnosis not present

## 2023-10-02 DIAGNOSIS — I152 Hypertension secondary to endocrine disorders: Secondary | ICD-10-CM | POA: Diagnosis not present

## 2023-10-02 DIAGNOSIS — E1169 Type 2 diabetes mellitus with other specified complication: Secondary | ICD-10-CM

## 2023-10-02 DIAGNOSIS — J0100 Acute maxillary sinusitis, unspecified: Secondary | ICD-10-CM | POA: Diagnosis not present

## 2023-10-02 DIAGNOSIS — I700 Atherosclerosis of aorta: Secondary | ICD-10-CM | POA: Diagnosis not present

## 2023-10-02 DIAGNOSIS — E1159 Type 2 diabetes mellitus with other circulatory complications: Secondary | ICD-10-CM | POA: Diagnosis not present

## 2023-10-02 LAB — POCT GLYCOSYLATED HEMOGLOBIN (HGB A1C): HbA1c, POC (controlled diabetic range): 6.6 % (ref 0.0–7.0)

## 2023-10-02 LAB — GLUCOSE, POCT (MANUAL RESULT ENTRY): POC Glucose: 112 mg/dL — AB (ref 70–99)

## 2023-10-02 MED ORDER — ROSUVASTATIN CALCIUM 5 MG PO TABS
5.0000 mg | ORAL_TABLET | Freq: Every day | ORAL | 3 refills | Status: DC
Start: 2023-10-02 — End: 2024-06-08

## 2023-10-02 MED ORDER — AZITHROMYCIN 250 MG PO TABS
ORAL_TABLET | ORAL | 0 refills | Status: DC
Start: 2023-10-02 — End: 2024-03-15

## 2023-10-02 NOTE — Progress Notes (Signed)
Patient ID: Debra Schmidt, female    DOB: 16-Aug-1961  MRN: [id number]  CC: Diabetes (DM f/u. /No refills needed/Suspects sinus infection - congestion, runny nose, post nasal drip X1 week/Yes to flu and Tdap)   Subjective: Debra Schmidt is a 62 y.o. female who presents for chronic ds management. Her concerns today include:  Patient with history of tob dep, preDM,  incomplete paraplegia, L1-L2 discitis with osteomyelitis, intra-spinal abscess s/p laminectomy L1-L2, chronic hep C (cured as of 02/2021), neurogenic bladder, osteomyelitis of right clavicle, acute on chronic pancreatitis/pancreatic divisum   Sinus: congestion, runny nose, post nasal drip X1 week; reports intermittent fever, yesterday was 99.  Mucous green/brown.  Endorses tenderness behind maxillary sinuses.   Not taking anything OTC for her symptoms.  DM: Results for orders placed or performed in visit on 10/02/23  POCT glycosylated hemoglobin (Hb A1C)  Result Value Ref Range   Hemoglobin A1C     HbA1c POC (<> result, manual entry)     HbA1c, POC (prediabetic range)     HbA1c, POC (controlled diabetic range) 6.6 0.0 - 7.0 %  POCT glucose (manual entry)  Result Value Ref Range   POC Glucose 112 (A) 70 - 99 mg/dl  Tolerating Metformin 213 mg daily. Doing okay with eating habits. Eats steak 1-2x/wk, loves chicken and pork chops; has cut back on amount sugar in sweet tea.  Taking Crestor as prescribed given atherosclerosis and coronary calcification that was seen on CT of the chest that was done for lung cancer screening. HYPERTENSION Currently taking: see medication list.  On last visit we had decreased amlodipine from 10 mg daily to 5 mg daily due to lower extremity edema.  We added HCTZ 12.5 mg instead.  Reports swelling in the legs have significantly decreased.  She is taking both medicines consistently. Med Adherence: [x]  Yes    []  No Medication side effects: []  Yes    []  No Adherence with salt restriction: [x]  Yes    []   No Home Monitoring?: [x]  Yes at drug store    Monitoring Frequency:  Home BP results range: can not recall but thinks 110/70s SOB? []  Yes    [x]  No Chest Pain?: []  Yes    [x]  No Leg swelling?: []  Yes    [x]  No Headaches?: []  Yes    [x]  No Dizziness? []  Yes    [x]  No Comments:   Tob:  down to 5 cigarettes/wk. still working on quitting. Wellbutrin helps decrease her craving.   Patient Active Problem List   Diagnosis Date Noted   Spasticity 01/27/2023   Benign torticollis 08/27/2021   Myofascial pain dysfunction syndrome 06/18/2021   Chronic pain syndrome 09/13/2020   Multiple lesions on CT of brain and spine 06/23/2020   Chronic pancreatitis (HCC) 06/23/2020   Epidural abscess 06/22/2020   Muscle spasms of both lower extremities 06/06/2020   Abnormal finding on MRI of brain 06/06/2020   Labile blood pressure    Neuropathic pain    History of hypertension    Incomplete paraplegia (HCC) 05/09/2020   Neurogenic bladder 05/09/2020   Neurogenic bowel 05/09/2020   Cigarette smoker 04/24/2020   Status post surgery 04/23/2020     Current Outpatient Medications on File Prior to Visit  Medication Sig Dispense Refill   acetaminophen (TYLENOL) 325 MG tablet Take 2 tablets (650 mg total) by mouth 3 (three) times daily.     amLODipine (NORVASC) 5 MG tablet Take 1 tablet (5 mg total) by mouth daily. 90  tablet 1   baclofen (LIORESAL) 20 MG tablet Take 1 tablet (20 mg total) by mouth 4 (four) times daily. Can take an additional tab 1x/day AS NEEDED as well- for spasticity 450 each 1   buPROPion ER (WELLBUTRIN SR) 100 MG 12 hr tablet Take 1 tablet (100 mg total) by mouth 2 (two) times daily. Breakfast and lunch- not dinner- don't take after 3pm- 60 tablet 5   calcium citrate (CALCITRATE - DOSED IN MG ELEMENTAL CALCIUM) 950 (200 Ca) MG tablet Take 1 tablet (200 mg of elemental calcium total) by mouth daily.     chlorhexidine (PERIDEX) 0.12 % solution      docusate sodium (COLACE) 100 MG capsule  Take 2 capsules (200 mg total) by mouth daily. 60 capsule 3   hydrochlorothiazide (HYDRODIURIL) 12.5 MG tablet Take 1 tablet (12.5 mg total) by mouth daily. 90 tablet 1   magnesium oxide (MAG-OX) 400 (241.3 Mg) MG tablet Take 0.5 tablets (200 mg total) by mouth 2 (two) times daily. 30 tablet 2   metFORMIN (GLUCOPHAGE) 500 MG tablet Take 1 tablet (500 mg total) by mouth daily with breakfast. 90 tablet 1   Multiple Vitamin (MULTIVITAMIN WITH MINERALS) TABS tablet Take 1 tablet by mouth daily. 100 tablet 1   solifenacin (VESICARE) 5 MG tablet Take 1 tablet (5 mg total) by mouth daily. 90 tablet 3   traZODone (DESYREL) 50 MG tablet Take 0.5-1 tablets (25-50 mg total) by mouth at bedtime. For sleep 30 tablet 5   Current Facility-Administered Medications on File Prior to Visit  Medication Dose Route Frequency Provider Last Rate Last Admin   lidocaine (XYLOCAINE) 1 % (with pres) injection 6 mL  6 mL Other Once Lovorn, Megan, MD       lidocaine (XYLOCAINE) 1 % (with pres) injection 6 mL  6 mL Other Once         Allergies  Allergen Reactions   Adhesive [Tape]    Chantix [Varenicline Tartrate] Nausea And Vomiting   Penicillins Hives    Tolerated Zosyn and Cefazolin 04/2020    Social History   Socioeconomic History   Marital status: Media planner    Spouse name: Not on file   Number of children: Not on file   Years of education: Not on file   Highest education level: Not on file  Occupational History   Not on file  Tobacco Use   Smoking status: Every Day    Current packs/day: 0.30    Types: Cigarettes    Passive exposure: Current   Smokeless tobacco: Never  Vaping Use   Vaping status: Never Used  Substance and Sexual Activity   Alcohol use: Not Currently   Drug use: Not Currently   Sexual activity: Not on file  Other Topics Concern   Not on file  Social History Narrative   Not on file   Social Determinants of Health   Financial Resource Strain: High Risk (09/16/2023)    Overall Financial Resource Strain (CARDIA)    Difficulty of Paying Living Expenses: Hard  Food Insecurity: No Food Insecurity (10/02/2023)   Hunger Vital Sign    Worried About Running Out of Food in the Last Year: Never true    Ran Out of Food in the Last Year: Never true  Transportation Needs: No Transportation Needs (09/16/2023)   PRAPARE - Administrator, Civil Service (Medical): No    Lack of Transportation (Non-Medical): No  Physical Activity: Insufficiently Active (09/16/2023)   Exercise Vital Sign  Days of Exercise per Week: 7 days    Minutes of Exercise per Session: 20 min  Stress: Stress Concern Present (09/16/2023)   [employer] of Occupational Health - Occupational Stress Questionnaire    Feeling of Stress : Rather much  Social Connections: Socially Integrated (09/16/2023)   Social Connection and Isolation Panel [NHANES]    Frequency of Communication with Friends and Family: More than three times a week    Frequency of Social Gatherings with Friends and Family: More than three times a week    Attends Religious Services: More than 4 times per year    Active Member of Clubs or Organizations: Yes    Attends Banker Meetings: More than 4 times per year    Marital Status: Living with partner  Intimate Partner Violence: Not At Risk (09/16/2023)   Humiliation, Afraid, Rape, and Kick questionnaire    Fear of Current or Ex-Partner: No    Emotionally Abused: No    Physically Abused: No    Sexually Abused: No    Family History  Problem Relation Age of Onset   Cancer Mother    Cancer Father     Past Surgical History:  Procedure Laterality Date   ACROMIO-CLAVICULAR JOINT REPAIR Right 04/25/2020   Procedure: ACROMIO-CLAVICULAR JOINT REPAIR;  Surgeon: Sheral Apley, MD;  Location: [facility] OR;  Service: Orthopedics;  Laterality: Right;   I & D EXTREMITY Right 04/25/2020   Procedure: Open debridement R AC joint; Distal clavicle repair;  Surgeon: Sheral Apley, MD;  Location: [facility] OR;  Service: Orthopedics;  Laterality: Right;   LUMBAR LAMINECTOMY/DECOMPRESSION MICRODISCECTOMY N/A 04/23/2020   Procedure: Lumbar One-Two LAMINECTOMY for Epidural Abscess Evacuation;  Surgeon: Coletta Memos, MD;  Location: [facility] OR;  Service: Neurosurgery;  Laterality: N/A;   RESECTION DISTAL CLAVICAL Right 04/25/2020   Procedure: RESECTION DISTAL CLAVICAL;  Surgeon: Sheral Apley, MD;  Location: [facility] OR;  Service: Orthopedics;  Laterality: Right;    ROS: Review of Systems Negative except as stated above  PHYSICAL EXAM: BP 122/75   Pulse 90   Temp 97.9 F (36.6 C) (Oral)   Ht 5\' 4"  (1.626 m)   Wt 103 lb (46.7 kg)   SpO2 94%   BMI 17.68 kg/m   Physical Exam  General appearance - alert, well appearing, older Caucasian female and in no distress Mental status - normal mood, behavior, dress,  and thought processes Neck -no cervical lymphadenopathy.  No thyromegaly. Chest - clear to auscultation, no wheezes, rales or rhonchi, symmetric air entry Heart - normal rate, regular rhythm, normal S1, S2, no murmurs, rubs, clicks or gallops Musculoskeletal - has her wooden cane Extremities -no lower extremity edema      Latest Ref Rng & Units 06/05/2023    2:24 PM 06/24/2022    2:36 PM 10/18/2021    2:14 PM  CMP  Glucose 70 - 99 mg/dL 696  95  295   BUN 8 - 27 mg/dL 14  9  13    Creatinine 0.57 - 1.00 mg/dL 2.84  1.32  4.40   Sodium 134 - 144 mmol/L 140  144  139   Potassium 3.5 - 5.2 mmol/L 4.7  4.1  4.7   Chloride 96 - 106 mmol/L 98  102  95   CO2 20 - 29 mmol/L 26  27  27    Calcium 8.7 - 10.3 mg/dL 9.8  9.1  10.2   Total Protein 6.0 - 8.5 g/dL 7.2  6.8  Total Bilirubin 0.0 - 1.2 mg/dL 0.2  0.2    Alkaline Phos 44 - 121 IU/L 133  104    AST 0 - 40 IU/L 20  25    ALT 0 - 32 IU/L 10  22     Lipid Panel     Component Value Date/Time   CHOL 137 06/05/2023 1424   TRIG 157 (H) 06/05/2023 1424   HDL 47 06/05/2023 1424   CHOLHDL 2.9 06/05/2023 1424    CHOLHDL 7.5 04/29/2020 1722   VLDL 38 04/29/2020 1722   LDLCALC 63 06/05/2023 1424    CBC    Component Value Date/Time   WBC 6.1 06/05/2023 1424   WBC 7.6 06/22/2020 0955   RBC 4.46 06/05/2023 1424   RBC 5.20 (H) 06/22/2020 0955   HGB 13.7 06/05/2023 1424   HCT 40.2 06/05/2023 1424   PLT 266 06/05/2023 1424   MCV 90 06/05/2023 1424   MCH 30.7 06/05/2023 1424   MCH 27.9 06/22/2020 0955   MCHC 34.1 06/05/2023 1424   MCHC 32.3 06/22/2020 0955   RDW 12.2 06/05/2023 1424   LYMPHSABS 1,794 06/22/2020 0955   MONOABS 0.4 05/29/2020 0346   EOSABS 350 06/22/2020 0955   BASOSABS 91 06/22/2020 0955    ASSESSMENT AND PLAN: 1. Type 2 diabetes mellitus with other specified complication, without long-term current use of insulin (HCC) A1c is at goal.  Continue metformin.   Dietary counseling given.  Encouraged her to eat healthier meats like chicken, [place] and seafood.  Less beef and pork.  Encouraged her to get rid of sugary drinks from the diet and drink more water. - POCT glycosylated hemoglobin (Hb A1C) - POCT glucose (manual entry) - rosuvastatin (CRESTOR) 5 MG tablet; Take 1 tablet (5 mg total) by mouth daily.  Dispense: 90 tablet; Refill: 3  2. Diabetes mellitus treated with oral medication (HCC) See #1 above  3. Hypertension associated with type 2 diabetes mellitus (HCC) At goal.  Continue HCTZ 12.5 mg daily and Norvasc 5 mg daily  4. Acute non-recurrent maxillary sinusitis - azithromycin (ZITHROMAX Z-PAK) 250 MG tablet; 2 tabs PO x 1 then 1 tab PO daily  Dispense: 6 each; Refill: 0  5. Aortic atherosclerosis (HCC) - rosuvastatin (CRESTOR) 5 MG tablet; Take 1 tablet (5 mg total) by mouth daily.  Dispense: 90 tablet; Refill: 3  6. Tobacco dependence Continue to encourage her to quit.  Advised to set a quit date.  7. Need for influenza vaccination Given today.  8. Need for Tdap vaccination Given today.    Patient was given the opportunity to ask questions.  Patient  verbalized understanding of the plan and was able to repeat key elements of the plan.   This documentation was completed using Paediatric nurse.  Any transcriptional errors are unintentional.  Orders Placed This Encounter  Procedures   POCT glycosylated hemoglobin (Hb A1C)   POCT glucose (manual entry)     Requested Prescriptions   Signed Prescriptions Disp Refills   rosuvastatin (CRESTOR) 5 MG tablet 90 tablet 3    Sig: Take 1 tablet (5 mg total) by mouth daily.   azithromycin (ZITHROMAX Z-PAK) 250 MG tablet 6 each 0    Sig: 2 tabs PO x 1 then 1 tab PO daily    Return in about 4 months (around 01/30/2024).  Jonah Blue, MD, FACP

## 2023-10-08 ENCOUNTER — Encounter: Payer: 59 | Attending: Physical Medicine and Rehabilitation | Admitting: Physical Medicine and Rehabilitation

## 2023-10-08 ENCOUNTER — Encounter: Payer: Self-pay | Admitting: Physical Medicine and Rehabilitation

## 2023-10-08 VITALS — BP 109/71 | HR 89 | Ht 64.0 in | Wt 102.0 lb

## 2023-10-08 DIAGNOSIS — M542 Cervicalgia: Secondary | ICD-10-CM | POA: Diagnosis not present

## 2023-10-08 DIAGNOSIS — M7918 Myalgia, other site: Secondary | ICD-10-CM | POA: Diagnosis not present

## 2023-10-08 DIAGNOSIS — G894 Chronic pain syndrome: Secondary | ICD-10-CM | POA: Insufficient documentation

## 2023-10-08 DIAGNOSIS — R252 Cramp and spasm: Secondary | ICD-10-CM | POA: Insufficient documentation

## 2023-10-08 DIAGNOSIS — F17210 Nicotine dependence, cigarettes, uncomplicated: Secondary | ICD-10-CM | POA: Insufficient documentation

## 2023-10-08 DIAGNOSIS — M436 Torticollis: Secondary | ICD-10-CM | POA: Insufficient documentation

## 2023-10-08 DIAGNOSIS — G8222 Paraplegia, incomplete: Secondary | ICD-10-CM | POA: Insufficient documentation

## 2023-10-08 MED ORDER — LIDOCAINE HCL 1 % IJ SOLN
6.0000 mL | Freq: Once | INTRAMUSCULAR | Status: DC
Start: 2023-10-08 — End: 2023-11-10

## 2023-10-08 NOTE — Patient Instructions (Signed)
Plan: Con't Wellbutrin-doesn't need refills. We discussed our goal is to get her to zero cigarettes.    2. Con't Trazodone- doesn't need refills.    3 Patient here for trigger point injections for forming torticollis  Consent done and on chart.  Cleaned areas with alcohol and injected using a 27 gauge 1.5 inch needle  Injected  Using 1% Lidocaine with no EPI  Upper traps B/L  Levators- B/L  Posterior scalenes Middle scalenes- B/L  Splenius Capitus- B/L  Pectoralis Major- B/L  Rhomboids Infraspinatus Teres Major/minor Thoracic paraspinals Lumbar paraspinals Other injections-     There was no bleeding or complications.  Patient was advised to drink a lot of water on day after injections to flush system Will have increased soreness for 12-48 hours after injections.  Can use Lidocaine patches the day AFTER injections Can use theracane on day of injections in places didn't inject Can use heating pad 4-6 hours AFTER injections    4. F/U in 6 weeks for Trp Injections and f/u on chronic pain/SCI and smoking.

## 2023-10-08 NOTE — Progress Notes (Signed)
Patient  Is a 62 yr old female with history of tob dep, , incomplete paraplegia, L1-L2 discitis with osteomyelitis, intra-spinal abscess s/p laminectomy L1-L2, chronic hep C, neurogenic bladder, osteomyelitis of right clavicle,has some spasticity for some reason?Also cervical dystonia- forming-  S/p IV ABX as of 06/03/20 Is an incomplete paraplegic due to osteomyelitis- has developing torticollis of neck.  Here for f/u on SCI and trigger point injections.                Leaning no more than usual.  Gets worse 10-14 days before injections due/today. But not quite as bad this past time.  Keeping more limber as well in yard.   Missed original appt since had sinus infection and fever- 3rd day of ABX - went to PCP-  tested negative for COVID.   Smoking- doing really good- some days  1-2 cigarettes/day- craving not there. Max 1.2 to 1 pack per week If wanted to quit, thinks would be able to if put mind to it . But husband smokes.   Still taking trazodone ~1-2 x/week.   Plan: Con't Wellbutrin-doesn't need refills. We discussed our goal is to get her to zero cigarettes.    2. Con't Trazodone- doesn't need refills.    3 Patient here for trigger point injections for forming torticollis  Consent done and on chart.  Cleaned areas with alcohol and injected using a 27 gauge 1.5 inch needle  Injected  Using 1% Lidocaine with no EPI  Upper traps B/L  Levators- B/L  Posterior scalenes Middle scalenes- B/L  Splenius Capitus- B/L  Pectoralis Major- B/L  Rhomboids Infraspinatus Teres Major/minor Thoracic paraspinals Lumbar paraspinals Other injections-     There was no bleeding or complications.  Patient was advised to drink a lot of water on day after injections to flush system Will have increased soreness for 12-48 hours after injections.  Can use Lidocaine patches the day AFTER injections Can use theracane on day of injections in places didn't inject Can use heating pad 4-6  hours AFTER injections    4. F/U in 6 weeks for Trp Injections and f/u on chronic pain/SCI and smoking.    5. Spent time discussing smoking since can reduce formation of COPD.

## 2023-11-07 ENCOUNTER — Other Ambulatory Visit: Payer: Self-pay | Admitting: Internal Medicine

## 2023-11-07 ENCOUNTER — Ambulatory Visit: Payer: 59

## 2023-11-07 DIAGNOSIS — Z1231 Encounter for screening mammogram for malignant neoplasm of breast: Secondary | ICD-10-CM

## 2023-11-10 ENCOUNTER — Encounter: Payer: Self-pay | Admitting: Physical Medicine and Rehabilitation

## 2023-11-10 ENCOUNTER — Encounter: Payer: 59 | Attending: Physical Medicine and Rehabilitation | Admitting: Physical Medicine and Rehabilitation

## 2023-11-10 VITALS — BP 102/61 | HR 96 | Ht 64.0 in | Wt 104.0 lb

## 2023-11-10 DIAGNOSIS — M7918 Myalgia, other site: Secondary | ICD-10-CM | POA: Diagnosis not present

## 2023-11-10 DIAGNOSIS — R252 Cramp and spasm: Secondary | ICD-10-CM

## 2023-11-10 DIAGNOSIS — M436 Torticollis: Secondary | ICD-10-CM | POA: Diagnosis not present

## 2023-11-10 DIAGNOSIS — M792 Neuralgia and neuritis, unspecified: Secondary | ICD-10-CM

## 2023-11-10 DIAGNOSIS — G8222 Paraplegia, incomplete: Secondary | ICD-10-CM

## 2023-11-10 MED ORDER — BACLOFEN 20 MG PO TABS: 20.0000 mg | ORAL_TABLET | Freq: Four times a day (QID) | ORAL | 1 refills | Status: DC

## 2023-11-10 MED ORDER — LIDOCAINE HCL 1 % IJ SOLN
6.0000 mL | Freq: Once | INTRAMUSCULAR | Status: AC
Start: 2023-11-10 — End: 2023-11-10
  Administered 2023-11-10: 6 mL

## 2023-11-10 MED ORDER — TRAZODONE HCL 50 MG PO TABS: 25.0000 mg | ORAL_TABLET | Freq: Every day | ORAL | 1 refills | Status: DC

## 2023-11-10 NOTE — Patient Instructions (Addendum)
Plan:  Cannot write for anxiety meds- I do not write for those- ask PCP- they might require a referral to Psychiatry. Suggest Paxil scheduled and Buspar as needed  2. Con't trazodone- 50 mg nightly for sleep- changed to 3 months supply- 1 refill.    3. Con't Baclofen  20mg  4x/day- can take another dose 1x/day as needed- #450- 3 months supply- 1 refill.   4. Patient here for trigger point injections for forming torticollis  Consent done and on chart.  Cleaned areas with alcohol and injected using a 27 gauge 1.5 inch needle  Injected 4.5 cc- wasted 1.5cc Using 1% Lidocaine with no EPI  Upper traps B/L  Levators- B/L  Posterior scalenes Middle scalenes- BL Splenius Capitus- B/L  Pectoralis Major- B/L  Rhomboids Infraspinatus Teres Major/minor Thoracic paraspinals Lumbar paraspinals Other injections-     Neck feels good after injections   There was no bleeding or complications.  Patient was advised to drink a lot of water on day after injections to flush system Will have increased soreness for 12-48 hours after injections.  Can use Lidocaine patches the day AFTER injections Can use theracane on day of injections in places didn't inject Can use heating pad 4-6 hours AFTER injections  5. F/U  in 6 weeks- trp injections- doesn't like Botox and also f/u on spasticity/abnormal gait.
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
## 2023-11-26 ENCOUNTER — Ambulatory Visit: Payer: 59

## 2023-12-03 ENCOUNTER — Other Ambulatory Visit: Payer: Self-pay | Admitting: Internal Medicine

## 2023-12-03 DIAGNOSIS — I10 Essential (primary) hypertension: Secondary | ICD-10-CM

## 2023-12-03 DIAGNOSIS — E1165 Type 2 diabetes mellitus with hyperglycemia: Secondary | ICD-10-CM

## 2023-12-09 ENCOUNTER — Other Ambulatory Visit: Payer: Self-pay | Admitting: Physical Medicine and Rehabilitation

## 2023-12-22 ENCOUNTER — Encounter: Payer: 59 | Admitting: Physical Medicine and Rehabilitation

## 2023-12-25 ENCOUNTER — Telehealth: Payer: Self-pay | Admitting: Internal Medicine

## 2023-12-25 NOTE — Telephone Encounter (Signed)
On December 25, 2023, at 10:38 am, I called the patient and verified their identity by confirming their date of birth. I informed them that their appointment with Dr. Laural Benes on January 30, 2024, needed to be rescheduled, as Dr. Laural Benes will not be in the office that day. Patient appointment has been rescheduled.

## 2024-01-30 ENCOUNTER — Ambulatory Visit: Payer: 59 | Admitting: Internal Medicine

## 2024-02-02 ENCOUNTER — Encounter: Payer: 59 | Admitting: Physical Medicine and Rehabilitation

## 2024-02-20 ENCOUNTER — Ambulatory Visit: Payer: 59 | Admitting: Internal Medicine

## 2024-03-15 ENCOUNTER — Encounter: Payer: 59 | Attending: Physical Medicine and Rehabilitation | Admitting: Physical Medicine and Rehabilitation

## 2024-03-15 ENCOUNTER — Encounter: Payer: Self-pay | Admitting: Physical Medicine and Rehabilitation

## 2024-03-15 VITALS — BP 132/83 | HR 108 | Ht 64.0 in | Wt 105.0 lb

## 2024-03-15 DIAGNOSIS — M7918 Myalgia, other site: Secondary | ICD-10-CM | POA: Insufficient documentation

## 2024-03-15 DIAGNOSIS — G894 Chronic pain syndrome: Secondary | ICD-10-CM | POA: Diagnosis not present

## 2024-03-15 DIAGNOSIS — G8222 Paraplegia, incomplete: Secondary | ICD-10-CM | POA: Diagnosis not present

## 2024-03-15 DIAGNOSIS — M436 Torticollis: Secondary | ICD-10-CM | POA: Insufficient documentation

## 2024-03-15 MED ORDER — LIDOCAINE HCL 1 % IJ SOLN
6.0000 mL | Freq: Once | INTRAMUSCULAR | Status: AC
Start: 2024-03-15 — End: 2024-03-15
  Administered 2024-03-15: 6 mL

## 2024-03-15 MED ORDER — BACLOFEN 20 MG PO TABS: 20.0000 mg | ORAL_TABLET | Freq: Four times a day (QID) | ORAL | 1 refills | Status: DC

## 2024-03-15 MED ORDER — TRAZODONE HCL 50 MG PO TABS: 25.0000 mg | ORAL_TABLET | Freq: Every day | ORAL | 1 refills | Status: DC
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
## 2024-04-02 ENCOUNTER — Other Ambulatory Visit: Payer: Self-pay | Admitting: Internal Medicine

## 2024-04-02 DIAGNOSIS — I10 Essential (primary) hypertension: Secondary | ICD-10-CM

## 2024-04-19 ENCOUNTER — Ambulatory Visit: Admitting: Internal Medicine

## 2024-04-23 ENCOUNTER — Encounter: Payer: 59 | Admitting: Physical Medicine and Rehabilitation

## 2024-04-28 ENCOUNTER — Ambulatory Visit: Payer: 59 | Admitting: Physical Medicine and Rehabilitation

## 2024-05-16 ENCOUNTER — Other Ambulatory Visit: Payer: Self-pay | Admitting: Internal Medicine

## 2024-05-16 DIAGNOSIS — E1165 Type 2 diabetes mellitus with hyperglycemia: Secondary | ICD-10-CM

## 2024-05-23 ENCOUNTER — Other Ambulatory Visit: Payer: Self-pay | Admitting: Internal Medicine

## 2024-05-23 DIAGNOSIS — I10 Essential (primary) hypertension: Secondary | ICD-10-CM

## 2024-06-07 ENCOUNTER — Telehealth: Payer: Self-pay | Admitting: Internal Medicine

## 2024-06-07 NOTE — Telephone Encounter (Signed)
Confirmed appt for 7/29

## 2024-06-08 ENCOUNTER — Encounter: Payer: Self-pay | Admitting: Internal Medicine

## 2024-06-08 ENCOUNTER — Ambulatory Visit: Attending: Internal Medicine | Admitting: Internal Medicine

## 2024-06-08 ENCOUNTER — Other Ambulatory Visit: Payer: Self-pay | Admitting: Internal Medicine

## 2024-06-08 VITALS — BP 121/72 | HR 75 | Ht 64.0 in | Wt 105.0 lb

## 2024-06-08 DIAGNOSIS — E119 Type 2 diabetes mellitus without complications: Secondary | ICD-10-CM

## 2024-06-08 DIAGNOSIS — Z1231 Encounter for screening mammogram for malignant neoplasm of breast: Secondary | ICD-10-CM

## 2024-06-08 DIAGNOSIS — M436 Torticollis: Secondary | ICD-10-CM | POA: Diagnosis not present

## 2024-06-08 DIAGNOSIS — Z87891 Personal history of nicotine dependence: Secondary | ICD-10-CM

## 2024-06-08 DIAGNOSIS — E1169 Type 2 diabetes mellitus with other specified complication: Secondary | ICD-10-CM | POA: Diagnosis not present

## 2024-06-08 DIAGNOSIS — Z7984 Long term (current) use of oral hypoglycemic drugs: Secondary | ICD-10-CM

## 2024-06-08 DIAGNOSIS — E1159 Type 2 diabetes mellitus with other circulatory complications: Secondary | ICD-10-CM

## 2024-06-08 DIAGNOSIS — G8222 Paraplegia, incomplete: Secondary | ICD-10-CM

## 2024-06-08 DIAGNOSIS — I152 Hypertension secondary to endocrine disorders: Secondary | ICD-10-CM | POA: Diagnosis not present

## 2024-06-08 DIAGNOSIS — I700 Atherosclerosis of aorta: Secondary | ICD-10-CM | POA: Diagnosis not present

## 2024-06-08 DIAGNOSIS — K861 Other chronic pancreatitis: Secondary | ICD-10-CM | POA: Diagnosis not present

## 2024-06-08 DIAGNOSIS — E1165 Type 2 diabetes mellitus with hyperglycemia: Secondary | ICD-10-CM

## 2024-06-08 LAB — GLUCOSE, POCT (MANUAL RESULT ENTRY): POC Glucose: 141 mg/dL — AB (ref 70–99)

## 2024-06-08 LAB — POCT GLYCOSYLATED HEMOGLOBIN (HGB A1C): HbA1c, POC (controlled diabetic range): 6.2 % (ref 0.0–7.0)

## 2024-06-08 MED ORDER — ROSUVASTATIN CALCIUM 5 MG PO TABS
5.0000 mg | ORAL_TABLET | Freq: Every day | ORAL | 3 refills | Status: DC
Start: 2024-06-08 — End: 2024-09-27

## 2024-06-08 MED ORDER — AMLODIPINE BESYLATE 5 MG PO TABS
5.0000 mg | ORAL_TABLET | Freq: Every day | ORAL | 2 refills | Status: DC
Start: 2024-06-08 — End: 2024-09-27

## 2024-06-08 MED ORDER — HYDROCHLOROTHIAZIDE 12.5 MG PO TABS
12.5000 mg | ORAL_TABLET | Freq: Every day | ORAL | 2 refills | Status: DC
Start: 2024-06-08 — End: 2024-09-30

## 2024-06-08 MED ORDER — METFORMIN HCL 500 MG PO TABS
500.0000 mg | ORAL_TABLET | Freq: Every day | ORAL | 2 refills | Status: DC
Start: 2024-06-08 — End: 2024-06-09

## 2024-06-08 NOTE — Patient Instructions (Signed)
 VISIT SUMMARY:  Today, you came in for a follow-up visit to manage your diabetes, hypertension, cholesterol, and other health concerns. Your A1c has improved, and your blood pressure and cholesterol are well-controlled with your current medications. We also discussed your smoking cessation progress and addressed a skin concern on your shoulder. Additionally, we reviewed your general health maintenance needs.  YOUR PLAN:  -TYPE 2 DIABETES MELLITUS: Type 2 diabetes is a condition where your body does not use insulin  properly, leading to high blood sugar levels. Your A1c has improved to 6.2, indicating good control. Continue taking metformin  500 mg daily. We will order an annual urine test for protein and blood tests for cholesterol, kidney, and liver function.  -ESSENTIAL HYPERTENSION: Hypertension is high blood pressure, which can lead to serious health problems if not managed. Your blood pressure is well-controlled with your current medications. Continue taking amlodipine  5 mg and hydrochlorothiazide  12.5 mg daily.  -HYPERLIPIDEMIA: Hyperlipidemia is having high levels of fats (lipids) in your blood, which can increase the risk of heart disease. Your cholesterol is managed with rosuvastatin . Continue taking rosuvastatin  5 mg daily. We will order blood tests to check your cholesterol levels.  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 Continue taking trazodone  at night and baclofen  for muscle spasms. Continue receiving regular injections for spasticity.  -NICOTINE DEPENDENCE, IN REMISSION: Nicotine dependence is an addiction to tobacco products. You have successfully quit smoking and are managing cravings with Wellbutrin . Continue taking Wellbutrin  to help with cravings.  -GENERAL HEALTH MAINTENANCE: You are due for a mammogram. We will  order the mammogram and schedule it with [facility].  INSTRUCTIONS:  Please follow up with the lab for your blood tests and urine test. Schedule your mammogram with [facility]. Continue taking your medications as prescribed and monitor your symptoms. If you notice any changes or have concerns, please contact our office.

## 2024-06-08 NOTE — Progress Notes (Signed)
 Patient ID: Debra Schmidt, female    DOB: 03/20/1961  MRN: [id number]  CC: Diabetes (DM f/u./Possible ringworm on L shoulder /)   Subjective: Debra Schmidt is a 63 y.o. female who presents for chronic ds management. Her concerns today include:  Patient with history of tob dep, DM,  incomplete paraplegia, L1-L2 discitis with osteomyelitis, intra-spinal abscess s/p laminectomy L1-L2, chronic hep C (cured as of 02/2021), neurogenic bladder, osteomyelitis of right clavicle, acute on chronic pancreatitis/pancreatic divisum   Discussed the use of AI scribe software for clinical note transcription with the patient, who gave verbal consent to proceed.  History of Present Illness Debra Schmidt is a 63 year old female with hypertension, diabetes, and incomplete paraplegia who presents for a follow-up visit.  DM: Results for orders placed or performed in visit on 06/08/24  POCT glucose (manual entry)   Collection Time: 06/08/24  2:45 PM  Result Value Ref Range   POC Glucose 141 (A) 70 - 99 mg/dl  POCT glycosylated hemoglobin (Hb A1C)   Collection Time: 06/08/24  2:50 PM  Result Value Ref Range   Hemoglobin A1C     HbA1c POC (<> result, manual entry)     HbA1c, POC (prediabetic range)     HbA1c, POC (controlled diabetic range) 6.2 0.0 - 7.0 %  Her A1c decreased from 6.6 to 6.2 since her last visit in November. Her blood sugar was 141 today. She continues to take metformin  500 mg once daily, usually with meals, and does not regularly check her blood sugars at home. She reports that she can usually tell when her blood sugar is off, describing symptoms such as feeling hot or craving sweets.  Hypertension: she continues to take amlodipine  5 mg and hydrochlorothiazide  12.5 mg daily. No chest pain or shortness of breath.  Tob: She has a history of smoking but quit approximately three to four months ago. She continues to take Wellbutrin  to help manage cravings, especially in the morning with  coffee. Her husband, who still smokes, is supportive by smoking outside.  Regarding her cholesterol and aortic plaque, she is on rosuvastatin  5 mg daily and recently had her prescription refilled. No recent flares of pancreatitis.  She mentions a concern about a possible ringworm on her right shoulder, noticed by her sister-in-law, but is unsure if it might be eczema due to sweating and handling animals.  She has incomplete paraplegia and uses a cane for mobility. She has not had any falls recently, except for an incident a year ago involving her cats. She receives regular injections for spasticity/torticollis in her neck, shoulders, and frontal areas from [facility] Dr. Cornelio, which help manage her symptoms. She takes trazodone  at night and baclofen  for muscle spasms.  She has history of chronic pancreatitis and pancreatic divisum.  She reports no abdominal pain or recent flares.  HM: She is due for a mammogram.    Patient Active Problem List   Diagnosis Date Noted   Spasticity 01/27/2023   Benign torticollis 08/27/2021   Myofascial pain dysfunction syndrome 06/18/2021   Chronic pain syndrome 09/13/2020   Multiple lesions on CT of brain and spine 06/23/2020   Chronic pancreatitis (HCC) 06/23/2020   Epidural abscess 06/22/2020   Muscle spasms of both lower extremities 06/06/2020   Abnormal finding on MRI of brain 06/06/2020   Labile blood pressure    Neuropathic pain    History of hypertension    Incomplete paraplegia (HCC) 05/09/2020   Neurogenic bladder 05/09/2020  Neurogenic bowel 05/09/2020   Cigarette smoker 04/24/2020   Status post surgery 04/23/2020     Current Outpatient Medications on File Prior to Visit  Medication Sig Dispense Refill   acetaminophen  (TYLENOL ) 325 MG tablet Take 2 tablets (650 mg total) by mouth 3 (three) times daily.     amLODipine  (NORVASC ) 5 MG tablet TAKE 1 TABLET (5 MG TOTAL) BY MOUTH DAILY. 90 tablet 1   baclofen  (LIORESAL ) 20 MG tablet Take 1 tablet  (20 mg total) by mouth 4 (four) times daily. Can take an additional tab 1x/day AS NEEDED as well- for spasticity 450 each 1   buPROPion  ER (WELLBUTRIN  SR) 100 MG 12 hr tablet TAKE 1 TABLET (100 MG TOTAL) BY MOUTH 2 (TWO) TIMES DAILY. BREAKFAST AND LUNCH- NOT DINNER- DON'T TAKE AFTER 3PM- 60 tablet 5   calcium  citrate (CALCITRATE - DOSED IN MG ELEMENTAL CALCIUM ) 950 (200 Ca) MG tablet Take 1 tablet (200 mg of elemental calcium  total) by mouth daily.     chlorhexidine  (PERIDEX ) 0.12 % solution      docusate sodium  (COLACE) 100 MG capsule Take 2 capsules (200 mg total) by mouth daily. 60 capsule 3   hydrochlorothiazide  (HYDRODIURIL ) 12.5 MG tablet TAKE 1 TABLET BY MOUTH EVERY DAY 90 tablet 1   metFORMIN  (GLUCOPHAGE ) 500 MG tablet TAKE 1 TABLET BY MOUTH EVERY DAY WITH BREAKFAST 90 tablet 1   Multiple Vitamin (MULTIVITAMIN WITH MINERALS) TABS tablet Take 1 tablet by mouth daily. 100 tablet 1   rosuvastatin  (CRESTOR ) 5 MG tablet Take 1 tablet (5 mg total) by mouth daily. 90 tablet 3   traZODone  (DESYREL ) 50 MG tablet Take 0.5-1 tablets (25-50 mg total) by mouth at bedtime. For sleep 90 tablet 1   magnesium  oxide (MAG-OX) 400 (241.3 Mg) MG tablet Take 0.5 tablets (200 mg total) by mouth 2 (two) times daily. (Patient not taking: Reported on 06/08/2024) 30 tablet 2   solifenacin  (VESICARE ) 5 MG tablet Take 1 tablet (5 mg total) by mouth daily. (Patient not taking: Reported on 06/08/2024) 90 tablet 3   No current facility-administered medications on file prior to visit.    Allergies  Allergen Reactions   Adhesive [Tape]    Chantix  [Varenicline  Tartrate] Nausea And Vomiting   Penicillins Hives    Tolerated Zosyn  and Cefazolin  04/2020    Social History   Socioeconomic History   Marital status: Media planner    Spouse name: Not on file   Number of children: Not on file   Years of education: Not on file   Highest education level: Not on file  Occupational History   Not on file  Tobacco Use    Smoking status: Every Day    Current packs/day: 0.30    Types: Cigarettes    Passive exposure: Current   Smokeless tobacco: Never  Vaping Use   Vaping status: Never Used  Substance and Sexual Activity   Alcohol use: Not Currently   Drug use: Not Currently   Sexual activity: Not on file  Other Topics Concern   Not on file  Social History Narrative   Not on file   Social Drivers of Health   Financial Resource Strain: High Risk (09/16/2023)   Overall Financial Resource Strain (CARDIA)    Difficulty of Paying Living Expenses: Hard  Food Insecurity: No Food Insecurity (10/02/2023)   Hunger Vital Sign    Worried About Running Out of Food in the Last Year: Never true    Ran Out of Food in the Last  Year: Never true  Transportation Needs: No Transportation Needs (09/16/2023)   PRAPARE - Administrator, Civil Service (Medical): No    Lack of Transportation (Non-Medical): No  Physical Activity: Insufficiently Active (09/16/2023)   Exercise Vital Sign    Days of Exercise per Week: 7 days    Minutes of Exercise per Session: 20 min  Stress: Stress Concern Present (09/16/2023)   [employer] of Occupational Health - Occupational Stress Questionnaire    Feeling of Stress : Rather much  Social Connections: Socially Integrated (09/16/2023)   Social Connection and Isolation Panel    Frequency of Communication with Friends and Family: More than three times a week    Frequency of Social Gatherings with Friends and Family: More than three times a week    Attends Religious Services: More than 4 times per year    Active Member of Clubs or Organizations: Yes    Attends Banker Meetings: More than 4 times per year    Marital Status: Living with partner  Intimate Partner Violence: Not At Risk (09/16/2023)   Humiliation, Afraid, Rape, and Kick questionnaire    Fear of Current or Ex-Partner: No    Emotionally Abused: No    Physically Abused: No    Sexually Abused: No     Family History  Problem Relation Age of Onset   Cancer Mother    Cancer Father     Past Surgical History:  Procedure Laterality Date   ACROMIO-CLAVICULAR JOINT REPAIR Right 04/25/2020   Procedure: ACROMIO-CLAVICULAR JOINT REPAIR;  Surgeon: Beverley Evalene BIRCH, MD;  Location: [facility] OR;  Service: Orthopedics;  Laterality: Right;   I & D EXTREMITY Right 04/25/2020   Procedure: Open debridement R AC joint; Distal clavicle repair;  Surgeon: Beverley Evalene BIRCH, MD;  Location: [facility] OR;  Service: Orthopedics;  Laterality: Right;   LUMBAR LAMINECTOMY/DECOMPRESSION MICRODISCECTOMY N/A 04/23/2020   Procedure: Lumbar One-Two LAMINECTOMY for Epidural Abscess Evacuation;  Surgeon: Gillie Duncans, MD;  Location: [facility] OR;  Service: Neurosurgery;  Laterality: N/A;   RESECTION DISTAL CLAVICAL Right 04/25/2020   Procedure: RESECTION DISTAL CLAVICAL;  Surgeon: Beverley Evalene BIRCH, MD;  Location: [facility] OR;  Service: Orthopedics;  Laterality: Right;    ROS: Review of Systems Negative except as stated above  PHYSICAL EXAM: BP 121/72 (BP Location: Left Arm, Patient Position: Sitting, Cuff Size: Small)   Pulse 75   Ht 5' 4 (1.626 m)   Wt 105 lb (47.6 kg)   SpO2 95%   BMI 18.02 kg/m   Wt Readings from Last 3 Encounters:  06/08/24 105 lb (47.6 kg)  03/15/24 105 lb (47.6 kg)  11/10/23 104 lb (47.2 kg)    Physical Exam  General appearance - alert, well appearing, older Caucasian female and in no distress Mental status - normal mood, behavior, speech, dress, motor activity, and thought processes Neck - neck held rotatated to RT side Chest - clear to auscultation, no wheezes, rales or rhonchi, symmetric air entry Heart - normal rate, regular rhythm, normal S1, S2, no murmurs, rubs, clicks or gallops Musculoskeletal - ambulates with cane, neck partially rotated to RT, slow gait; uses cane Extremities - trace LE edema      Latest Ref Rng & Units 06/05/2023    2:24 PM 06/24/2022    2:36 PM 10/18/2021    2:14 PM   CMP  Glucose 70 - 99 mg/dL 862  95  890   BUN 8 - 27 mg/dL 14  9  13  Creatinine 0.57 - 1.00 mg/dL 9.36  9.41  9.28   Sodium 134 - 144 mmol/L 140  144  139   Potassium 3.5 - 5.2 mmol/L 4.7  4.1  4.7   Chloride 96 - 106 mmol/L 98  102  95   CO2 20 - 29 mmol/L 26  27  27    Calcium  8.7 - 10.3 mg/dL 9.8  9.1  88.8   Total Protein 6.0 - 8.5 g/dL 7.2  6.8    Total Bilirubin 0.0 - 1.2 mg/dL 0.2  0.2    Alkaline Phos 44 - 121 IU/L 133  104    AST 0 - 40 IU/L 20  25    ALT 0 - 32 IU/L 10  22     Lipid Panel     Component Value Date/Time   CHOL 137 06/05/2023 1424   TRIG 157 (H) 06/05/2023 1424   HDL 47 06/05/2023 1424   CHOLHDL 2.9 06/05/2023 1424   CHOLHDL 7.5 04/29/2020 1722   VLDL 38 04/29/2020 1722   LDLCALC 63 06/05/2023 1424    CBC    Component Value Date/Time   WBC 6.1 06/05/2023 1424   WBC 7.6 06/22/2020 0955   RBC 4.46 06/05/2023 1424   RBC 5.20 (H) 06/22/2020 0955   HGB 13.7 06/05/2023 1424   HCT 40.2 06/05/2023 1424   PLT 266 06/05/2023 1424   MCV 90 06/05/2023 1424   MCH 30.7 06/05/2023 1424   MCH 27.9 06/22/2020 0955   MCHC 34.1 06/05/2023 1424   MCHC 32.3 06/22/2020 0955   RDW 12.2 06/05/2023 1424   LYMPHSABS 1,794 06/22/2020 0955   MONOABS 0.4 05/29/2020 0346   EOSABS 350 06/22/2020 0955   BASOSABS 91 06/22/2020 0955    ASSESSMENT AND PLAN: 1. Type 2 diabetes mellitus with other specified complication, without long-term current use of insulin  (HCC) (Primary) At goal.  Continue metformin  500 mg daily.  Encouraged healthy eating habits. - rosuvastatin  (CRESTOR ) 5 MG tablet; Take 1 tablet (5 mg total) by mouth daily.  Dispense: 90 tablet; Refill: 3 - metFORMIN  (GLUCOPHAGE ) 500 MG tablet; Take 1 tablet (500 mg total) by mouth daily with breakfast.  Dispense: 90 tablet; Refill: 2 - CBC - Comprehensive metabolic panel with GFR - Lipid panel - Microalbumin / creatinine urine ratio  2. Diabetes mellitus treated with oral medication (HCC) - POCT  glycosylated hemoglobin (Hb A1C) - POCT glucose (manual entry)  3. Hypertension associated with type 2 diabetes mellitus (HCC) At goal.  Continue Norvasc  and HCTZ - amLODipine  (NORVASC ) 5 MG tablet; Take 1 tablet (5 mg total) by mouth daily.  Dispense: 90 tablet; Refill: 2 - hydrochlorothiazide  (HYDRODIURIL ) 12.5 MG tablet; Take 1 tablet (12.5 mg total) by mouth daily.  Dispense: 90 tablet; Refill: 2  4. Aortic atherosclerosis (HCC) - rosuvastatin  (CRESTOR ) 5 MG tablet; Take 1 tablet (5 mg total) by mouth daily.  Dispense: 90 tablet; Refill: 3  5. Chronic pancreatitis, unspecified pancreatitis type (HCC) No recent flares.  6. Former smoker Commended on quitting.  Encouraged her to remain tobacco free.  She will continue Wellbutrin  for now  7. Incomplete paraplegia (HCC) Plugged in with physical medicine and rehab.  8. Torticollis Patient gets injections through physical medicine and rehab.  Continue baclofen   9. Encounter for screening mammogram for malignant neoplasm of breast - MM 3D SCREENING MAMMOGRAM BILATERAL BREAST; Future   Patient was given the opportunity to ask questions.  Patient verbalized understanding of the plan and was able to repeat key elements  of the plan.   This documentation was completed using Paediatric nurse.  Any transcriptional errors are unintentional.  Orders Placed This Encounter  Procedures   POCT glycosylated hemoglobin (Hb A1C)   POCT glucose (manual entry)     Requested Prescriptions   Pending Prescriptions Disp Refills   amLODipine  (NORVASC ) 5 MG tablet 90 tablet 1    Sig: Take 1 tablet (5 mg total) by mouth daily.   hydrochlorothiazide  (HYDRODIURIL ) 12.5 MG tablet 90 tablet 1    Sig: Take 1 tablet (12.5 mg total) by mouth daily.   rosuvastatin  (CRESTOR ) 5 MG tablet 90 tablet 3    Sig: Take 1 tablet (5 mg total) by mouth daily.   metFORMIN  (GLUCOPHAGE ) 500 MG tablet 90 tablet 1    No follow-ups on file.  Barnie Louder, MD, FACP

## 2024-06-09 ENCOUNTER — Other Ambulatory Visit: Payer: Self-pay | Admitting: Internal Medicine

## 2024-06-09 ENCOUNTER — Ambulatory Visit: Payer: Self-pay | Admitting: Internal Medicine

## 2024-06-09 MED ORDER — LOKELMA 5 G PO PACK: PACK | ORAL | 0 refills | Status: DC

## 2024-06-09 NOTE — Telephone Encounter (Signed)
 Phone call placed to patient this morning to go over lab results.  I left a message on her voicemail informing of who I am that I was calling to go over lab results.  I requested that she give us  a call. Please let patient know the following: -Potassium level is significantly elevated even though she is not on any medication that could elevate potassium level and she has never had this issue in the past 3 years.  Would like for her to return to the lab today if possible to have this repeated.  High potassium level can cause the heart to go into abnormal rhythms she can be deadly. If she is unable to come to the lab, then I will send a prescription to her pharmacy for a one-time dose of medication called Lokelma  to lower the potassium level after which she should return to the lab in 2 days to have it repeated. Please let me know her response.  -Cholesterol levels are good. Blood cell counts are normal.  Results for orders placed or performed in visit on 06/08/24  POCT glucose (manual entry)   Collection Time: 06/08/24  2:45 PM  Result Value Ref Range   POC Glucose 141 (A) 70 - 99 mg/dl  POCT glycosylated hemoglobin (Hb A1C)   Collection Time: 06/08/24  2:50 PM  Result Value Ref Range   Hemoglobin A1C     HbA1c POC (<> result, manual entry)     HbA1c, POC (prediabetic range)     HbA1c, POC (controlled diabetic range) 6.2 0.0 - 7.0 %  CBC   Collection Time: 06/08/24  3:39 PM  Result Value Ref Range   WBC 6.7 3.4 - 10.8 x10E3/uL   RBC 4.58 3.77 - 5.28 x10E6/uL   Hemoglobin 13.4 11.1 - 15.9 g/dL   Hematocrit 57.1 65.9 - 46.6 %   MCV 93 79 - 97 fL   MCH 29.3 26.6 - 33.0 pg   MCHC 31.3 (L) 31.5 - 35.7 g/dL   RDW 87.3 88.2 - 84.5 %   Platelets 268 150 - 450 x10E3/uL  Comprehensive metabolic panel with GFR   Collection Time: 06/08/24  3:39 PM  Result Value Ref Range   Glucose 107 (H) 70 - 99 mg/dL   BUN 9 8 - 27 mg/dL   Creatinine, Ser 9.21 0.57 - 1.00 mg/dL   eGFR 85 >40 fO/fpw/8.26    BUN/Creatinine Ratio 12 12 - 28   Sodium 144 134 - 144 mmol/L   Potassium 6.0 (H) 3.5 - 5.2 mmol/L   Chloride 99 96 - 106 mmol/L   CO2 30 (H) 20 - 29 mmol/L   Calcium  9.9 8.7 - 10.3 mg/dL   Total Protein 7.2 6.0 - 8.5 g/dL   Albumin 4.5 3.9 - 4.9 g/dL   Globulin, Total 2.7 1.5 - 4.5 g/dL   Bilirubin Total 0.3 0.0 - 1.2 mg/dL   Alkaline Phosphatase 110 44 - 121 IU/L   AST 18 0 - 40 IU/L   ALT 12 0 - 32 IU/L  Lipid panel   Collection Time: 06/08/24  3:39 PM  Result Value Ref Range   Cholesterol, Total 97 (L) 100 - 199 mg/dL   Triglycerides 67 0 - 149 mg/dL   HDL 50 >60 mg/dL   VLDL Cholesterol Cal 15 5 - 40 mg/dL   LDL Chol Calc (NIH) 32 0 - 99 mg/dL   Chol/HDL Ratio 1.9 0.0 - 4.4 ratio  Microalbumin / creatinine urine ratio   Collection Time: 06/08/24  3:39 PM  Result Value Ref Range   Creatinine, Urine WILL FOLLOW    Microalbumin, Urine WILL FOLLOW    Microalb/Creat Ratio WILL FOLLOW

## 2024-06-09 NOTE — Progress Notes (Signed)
 Spoke with patient advised per PCP-Potassium level is significantly elevated even though she is not on any medication that could elevate potassium level and she has never had this issue in the past 3 years.  Would like for her to return to the lab today if possible to have this repeated.  High potassium level can cause the heart to go into abnormal rhythms she can be deadly. If she is unable to come to the lab, then I will send a prescription to her pharmacy for a one-time dose of medication called Lokelma  to lower the potassium level after which she should return to the lab in 2 days to have it repeated.  Patient unable to come in today.  She has to be in court today after lunch. Advised patient that we would send in Lokelma   to [id number] - [place], [place] - [address] [place] [address].   Patient voiced that she would pick it up on the way back to court.  Advised that she should return to the office on Friday for repeat K+.  Advised patient that is she experience any chest pain SOB, Arm pain or any s/s of heart attack to call 911. Patient voiced understanding.

## 2024-06-10 ENCOUNTER — Telehealth: Payer: Self-pay | Admitting: Internal Medicine

## 2024-06-10 LAB — COMPREHENSIVE METABOLIC PANEL WITH GFR
ALT: 12 IU/L (ref 0–32)
AST: 18 IU/L (ref 0–40)
Albumin: 4.5 g/dL (ref 3.9–4.9)
Alkaline Phosphatase: 110 IU/L (ref 44–121)
BUN/Creatinine Ratio: 12 (ref 12–28)
BUN: 9 mg/dL (ref 8–27)
Bilirubin Total: 0.3 mg/dL (ref 0.0–1.2)
CO2: 30 mmol/L — ABNORMAL HIGH (ref 20–29)
Calcium: 9.9 mg/dL (ref 8.7–10.3)
Chloride: 99 mmol/L (ref 96–106)
Creatinine, Ser: 0.78 mg/dL (ref 0.57–1.00)
Globulin, Total: 2.7 g/dL (ref 1.5–4.5)
Glucose: 107 mg/dL — ABNORMAL HIGH (ref 70–99)
Potassium: 6 mmol/L — ABNORMAL HIGH (ref 3.5–5.2)
Sodium: 144 mmol/L (ref 134–144)
Total Protein: 7.2 g/dL (ref 6.0–8.5)
eGFR: 85 mL/min/1.73 (ref >59)

## 2024-06-10 LAB — MICROALBUMIN / CREATININE URINE RATIO
Creatinine, Urine: 56.7 mg/dL
Microalb/Creat Ratio: 30 mg/g{creat} — AB (ref 0–29)
Microalbumin, Urine: 16.9 ug/mL

## 2024-06-10 LAB — LIPID PANEL
Chol/HDL Ratio: 1.9 ratio (ref 0.0–4.4)
Cholesterol, Total: 97 mg/dL — ABNORMAL LOW (ref 100–199)
HDL: 50 mg/dL (ref >39)
LDL Chol Calc (NIH): 32 mg/dL (ref 0–99)
Triglycerides: 67 mg/dL (ref 0–149)
VLDL Cholesterol Cal: 15 mg/dL (ref 5–40)

## 2024-06-10 LAB — CBC
Hematocrit: 42.8 % (ref 34.0–46.6)
Hemoglobin: 13.4 g/dL (ref 11.1–15.9)
MCH: 29.3 pg (ref 26.6–33.0)
MCHC: 31.3 g/dL — ABNORMAL LOW (ref 31.5–35.7)
MCV: 93 fL (ref 79–97)
Platelets: 268 x10E3/uL (ref 150–450)
RBC: 4.58 x10E6/uL (ref 3.77–5.28)
RDW: 12.6 % (ref 11.7–15.4)
WBC: 6.7 x10E3/uL (ref 3.4–10.8)

## 2024-06-10 NOTE — Telephone Encounter (Signed)
 Copied from CRM [id number]. Topic: Clinical - Prescription Issue >> Jun 10, 2024  1:25 PM Debra Schmidt wrote:  Reason for CRM: patient calling to let Dr Vicci know that her pharmacy is out of stock of the medication powder that she prescribed yesterday pt would like to ask what should she do since the pharmacy is out  sodium zirconium cyclosilicate  (LOKELMA ) 5 g packet Pt num [phone] (M)

## 2024-06-10 NOTE — Telephone Encounter (Signed)
 Still come to lab tomorrow to have potassium level rechecked. Let me know the name and location of a different pharmacy to send the prescription/.

## 2024-06-11 MED ORDER — LOKELMA 5 G PO PACK: PACK | ORAL | 0 refills | Status: DC

## 2024-06-11 NOTE — Telephone Encounter (Signed)
 Spoke with patient. Patient confirmed that she was coming this afternoon for repeat lab. Patient voiced that you can send Lokelma  to  [facility] [address] [place], [place] [address]  [phone]

## 2024-06-11 NOTE — Addendum Note (Signed)
 Addended by: VICCI SOBER B on: 06/11/2024 05:15 PM   Modules accepted: Orders

## 2024-06-22 ENCOUNTER — Ambulatory Visit

## 2024-06-23 ENCOUNTER — Encounter: Attending: Physical Medicine and Rehabilitation | Admitting: Physical Medicine and Rehabilitation

## 2024-06-23 DIAGNOSIS — G8222 Paraplegia, incomplete: Secondary | ICD-10-CM | POA: Insufficient documentation

## 2024-06-23 DIAGNOSIS — G894 Chronic pain syndrome: Secondary | ICD-10-CM | POA: Insufficient documentation

## 2024-06-23 DIAGNOSIS — M436 Torticollis: Secondary | ICD-10-CM | POA: Insufficient documentation

## 2024-06-23 DIAGNOSIS — M7918 Myalgia, other site: Secondary | ICD-10-CM | POA: Insufficient documentation

## 2024-08-04 ENCOUNTER — Encounter: Attending: Physical Medicine and Rehabilitation | Admitting: Physical Medicine and Rehabilitation

## 2024-08-04 ENCOUNTER — Encounter: Payer: Self-pay | Admitting: Physical Medicine and Rehabilitation

## 2024-08-04 VITALS — BP 131/74 | HR 110 | Ht 64.0 in | Wt 99.8 lb

## 2024-08-04 DIAGNOSIS — R252 Cramp and spasm: Secondary | ICD-10-CM | POA: Diagnosis not present

## 2024-08-04 DIAGNOSIS — M436 Torticollis: Secondary | ICD-10-CM | POA: Insufficient documentation

## 2024-08-04 DIAGNOSIS — M7918 Myalgia, other site: Secondary | ICD-10-CM | POA: Insufficient documentation

## 2024-08-04 DIAGNOSIS — G8222 Paraplegia, incomplete: Secondary | ICD-10-CM | POA: Insufficient documentation

## 2024-08-04 DIAGNOSIS — G894 Chronic pain syndrome: Secondary | ICD-10-CM | POA: Diagnosis not present

## 2024-08-04 MED ORDER — TRAZODONE HCL 50 MG PO TABS: 25.0000 mg | ORAL_TABLET | Freq: Every day | ORAL | 1 refills | Status: DC

## 2024-08-04 MED ORDER — LIDOCAINE HCL 1 % IJ SOLN
6.0000 mL | Freq: Once | INTRAMUSCULAR | Status: AC
Start: 2024-08-04 — End: 2024-08-04
  Administered 2024-08-04: 6 mL

## 2024-08-04 MED ORDER — BACLOFEN 20 MG PO TABS: 20.0000 mg | ORAL_TABLET | Freq: Four times a day (QID) | ORAL | 1 refills | Status: DC
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
## 2024-08-04 NOTE — Patient Instructions (Signed)
  Patient here for trigger point injections for forming torticollis  Consent done and on chart.  Cleaned areas with alcohol and injected using a 27 gauge 1.5 inch needle  Injected 3cc none wasted Using 1% Lidocaine  with no EPI  Upper traps B/L  Levators  B/L and L x2 Posterior scalenes Middle scalenes- B/L  Splenius Capitus- B/L  Pectoralis Major- B/L  Rhomboids B/L higher up Infraspinatus Teres Major/minor Thoracic paraspinals Lumbar paraspinals Other injections-    Patient's level of pain prior was 8/10 Current level of pain after injections is- down to 6/10 already  There was no bleeding or complications.  Patient was advised to drink a lot of water on day after injections to flush system Will have increased soreness for 12-48 hours after injections.  Can use Lidocaine  patches the day AFTER injections Can use theracane on day of injections in places didn't inject Can use heating pad 4-6 hours AFTER injections   2. Con't Trazodone - needs refills- sent in   3. Con't Baclofen  20 mg 3x/day and up to 1x/day as needed- changed from 150 tabs/month to 120 tabs/month with 3 months supply  4. F/U in 2 months- for trP injections and f/u on torticollis and spasticity.

## 2024-09-15 ENCOUNTER — Encounter: Admitting: Physical Medicine and Rehabilitation

## 2024-09-17 ENCOUNTER — Encounter: Attending: Physical Medicine and Rehabilitation | Admitting: Physical Medicine and Rehabilitation

## 2024-09-17 DIAGNOSIS — R252 Cramp and spasm: Secondary | ICD-10-CM | POA: Insufficient documentation

## 2024-09-17 DIAGNOSIS — G8222 Paraplegia, incomplete: Secondary | ICD-10-CM | POA: Insufficient documentation

## 2024-09-17 DIAGNOSIS — M436 Torticollis: Secondary | ICD-10-CM | POA: Insufficient documentation

## 2024-09-17 DIAGNOSIS — G894 Chronic pain syndrome: Secondary | ICD-10-CM | POA: Insufficient documentation

## 2024-09-17 DIAGNOSIS — M7918 Myalgia, other site: Secondary | ICD-10-CM | POA: Insufficient documentation

## 2024-09-21 ENCOUNTER — Ambulatory Visit: Payer: 59 | Attending: Internal Medicine

## 2024-09-27 ENCOUNTER — Other Ambulatory Visit: Payer: Self-pay | Admitting: Physical Medicine and Rehabilitation

## 2024-09-27 ENCOUNTER — Other Ambulatory Visit: Payer: Self-pay | Admitting: Internal Medicine

## 2024-09-27 DIAGNOSIS — E1165 Type 2 diabetes mellitus with hyperglycemia: Secondary | ICD-10-CM

## 2024-09-27 DIAGNOSIS — E1169 Type 2 diabetes mellitus with other specified complication: Secondary | ICD-10-CM

## 2024-09-27 DIAGNOSIS — I700 Atherosclerosis of aorta: Secondary | ICD-10-CM

## 2024-09-27 DIAGNOSIS — I152 Hypertension secondary to endocrine disorders: Secondary | ICD-10-CM

## 2024-09-30 ENCOUNTER — Other Ambulatory Visit: Payer: Self-pay | Admitting: Internal Medicine

## 2024-09-30 DIAGNOSIS — E1159 Type 2 diabetes mellitus with other circulatory complications: Secondary | ICD-10-CM

## 2024-09-30 NOTE — Telephone Encounter (Signed)
 Copied from CRM [id number]. Topic: Clinical - Medication Refill >> Sep 30, 2024  3:38 PM Winona R wrote: Medication: hydrochlorothiazide  (HYDRODIURIL ) 12.5 MG tablet [[id number]] Multiple Vitamin (MULTIVITAMIN WITH MINERALS) TABS tablet [[id number]] calcium  citrate (CALCITRATE - DOSED IN MG ELEMENTAL CALCIUM ) 950 (200 Ca) MG tablet [[id number]] (Not ordered by Vicci )  Has the patient contacted their pharmacy? Yes (Agent: If no, request that the patient contact the pharmacy for the refill. If patient does not wish to contact the pharmacy document the reason why and proceed with request.) (Agent: If yes, when and what did the pharmacy advise?)  This is the patient's preferred pharmacy:   [facility], [place] - [address] [place] [address] Phone: [phone] Fax: [phone]  Is this the correct pharmacy for this prescription? Yes If no, delete pharmacy and type the correct one.   Has the prescription been filled recently? No  Is the patient out of the medication? Unknown Pharmacy requesting   Has the patient been seen for an appointment in the last year OR does the patient have an upcoming appointment? Yes  Can we respond through MyChart: N/A   Agent: Please be advised that Rx refills may take up to 3 business days. We ask that you follow-up with your pharmacy.

## 2024-10-01 ENCOUNTER — Other Ambulatory Visit: Payer: Self-pay

## 2024-10-01 MED ORDER — HYDROCHLOROTHIAZIDE 12.5 MG PO TABS: 12.5000 mg | ORAL_TABLET | Freq: Every day | ORAL | 2 refills | Status: DC

## 2024-10-01 MED ORDER — ADULT MULTIVITAMIN W/MINERALS CH: 1.0000 | ORAL_TABLET | Freq: Every day | ORAL | 1 refills | Status: DC

## 2024-10-03 ENCOUNTER — Other Ambulatory Visit: Payer: Self-pay | Admitting: Internal Medicine

## 2024-10-03 DIAGNOSIS — E1165 Type 2 diabetes mellitus with hyperglycemia: Secondary | ICD-10-CM

## 2024-10-03 DIAGNOSIS — I700 Atherosclerosis of aorta: Secondary | ICD-10-CM

## 2024-10-03 DIAGNOSIS — E1169 Type 2 diabetes mellitus with other specified complication: Secondary | ICD-10-CM

## 2024-10-03 DIAGNOSIS — E1159 Type 2 diabetes mellitus with other circulatory complications: Secondary | ICD-10-CM

## 2024-10-03 MED ORDER — AMLODIPINE BESYLATE 5 MG PO TABS: 5.0000 mg | ORAL_TABLET | Freq: Every day | ORAL | 1 refills | Status: AC

## 2024-10-03 MED ORDER — HYDROCHLOROTHIAZIDE 12.5 MG PO TABS: 12.5000 mg | ORAL_TABLET | Freq: Every day | ORAL | 1 refills | Status: AC

## 2024-10-03 MED ORDER — METFORMIN HCL 500 MG PO TABS: 500.0000 mg | ORAL_TABLET | Freq: Every day | ORAL | 1 refills | Status: AC

## 2024-10-03 MED ORDER — ROSUVASTATIN CALCIUM 5 MG PO TABS: 5.0000 mg | ORAL_TABLET | Freq: Every day | ORAL | 1 refills | Status: AC

## 2024-10-04 ENCOUNTER — Other Ambulatory Visit: Payer: Self-pay | Admitting: *Deleted

## 2024-10-04 MED ORDER — BUPROPION HCL ER (SR) 100 MG PO TB12: 100.0000 mg | ORAL_TABLET | Freq: Two times a day (BID) | ORAL | 5 refills | Status: AC

## 2024-10-05 ENCOUNTER — Telehealth: Payer: Self-pay

## 2024-10-05 NOTE — Telephone Encounter (Signed)
 Copied from CRM #[id number]. Topic: Clinical - Medication Question >> Oct 05, 2024  4:49 PM Shanda MATSU wrote: Reason for CRM: Crista w/[facility] CB: [phone] calling to check status of med refill req for meds, Multiple Vitamin (MULTIVITAMIN WITH MINERALS) TABS table & calcium  citrate (CALCITRATE - DOSED IN MG ELEMENTAL CALCIUM ) 950 (200 Ca) MG tablet.

## 2024-10-06 ENCOUNTER — Other Ambulatory Visit: Payer: Self-pay | Admitting: Internal Medicine

## 2024-10-08 ENCOUNTER — Other Ambulatory Visit: Payer: Self-pay | Admitting: Internal Medicine

## 2024-10-08 MED ORDER — ADULT MULTIVITAMIN W/MINERALS CH: 1.0000 | ORAL_TABLET | Freq: Every day | ORAL | 1 refills | Status: AC

## 2024-10-08 MED ORDER — ADULT MULTIVITAMIN W/MINERALS CH: 1.0000 | ORAL_TABLET | Freq: Every day | ORAL | 1 refills | Status: DC

## 2024-10-08 NOTE — Telephone Encounter (Signed)
 Prescription sent for the MV.  Find out from pt if she is taking Calcium  Supplement or not.

## 2024-10-11 ENCOUNTER — Ambulatory Visit: Admitting: Internal Medicine

## 2024-10-11 NOTE — Telephone Encounter (Signed)
 Called & spoke to the patient. Verified name & DOB. Inquired if patient would like a prescription for calcium  citrate since it has been around 3 years since the last prescription. Patient confirmed that she would like a prescription because she has been getting them OTC and would prefer to have it delivered.

## 2024-10-13 ENCOUNTER — Other Ambulatory Visit: Payer: Self-pay | Admitting: Internal Medicine

## 2024-10-13 NOTE — Telephone Encounter (Signed)
 Noted! Thank you

## 2024-10-13 NOTE — Telephone Encounter (Signed)
 Will address with her on her appt in 2 days.

## 2024-10-15 ENCOUNTER — Ambulatory Visit: Admitting: Internal Medicine

## 2024-10-15 ENCOUNTER — Telehealth: Payer: Self-pay | Admitting: Internal Medicine

## 2024-10-15 NOTE — Telephone Encounter (Signed)
 Patient was contacted regarding missed appointment on 10/15/2024. Unable to leave voicemail.

## 2024-10-27 ENCOUNTER — Encounter: Attending: Physical Medicine and Rehabilitation | Admitting: Physical Medicine and Rehabilitation

## 2024-10-27 DIAGNOSIS — M436 Torticollis: Secondary | ICD-10-CM | POA: Insufficient documentation

## 2024-10-27 DIAGNOSIS — R252 Cramp and spasm: Secondary | ICD-10-CM | POA: Insufficient documentation

## 2024-10-27 DIAGNOSIS — G8222 Paraplegia, incomplete: Secondary | ICD-10-CM | POA: Insufficient documentation

## 2024-10-27 DIAGNOSIS — G894 Chronic pain syndrome: Secondary | ICD-10-CM | POA: Insufficient documentation

## 2024-10-27 DIAGNOSIS — M7918 Myalgia, other site: Secondary | ICD-10-CM | POA: Insufficient documentation

## 2024-11-10 ENCOUNTER — Other Ambulatory Visit: Payer: Self-pay | Admitting: Internal Medicine

## 2024-11-10 MED ORDER — CALCIUM CITRATE 950 (200 CA) MG PO TABS: 200.0000 mg | ORAL_TABLET | Freq: Every day | ORAL | 1 refills | Status: AC

## 2024-11-16 DIAGNOSIS — E1169 Type 2 diabetes mellitus with other specified complication: Secondary | ICD-10-CM

## 2024-11-16 DIAGNOSIS — I700 Atherosclerosis of aorta: Secondary | ICD-10-CM

## 2024-11-17 ENCOUNTER — Telehealth: Payer: Self-pay

## 2024-11-17 NOTE — Telephone Encounter (Signed)
 Copied from CRM #[id number]. Topic: Clinical - Medication Question >> Nov 17, 2024 11:18 AM Montie POUR wrote: Reason for CRM:  Destini is out to calcium  citrate (CALCITRATE - DOSED IN MG ELEMENTAL CALCIUM ) 950 (200 Ca) MG tablet and she has made the first available appointment for 12/21/24. She wants to see if Dr. Vicci would go ahead and call medication in since she is out. Please send order to [facility] on [address]. Please call Errika at [phone] with questions.

## 2024-11-18 NOTE — Telephone Encounter (Signed)
 Call to patient to advised that she still has refill available on her calcium  citrate (CALCITRATE - DOSED IN MG ELEMENTAL CALCIUM ) 950 (200 Ca) MG tablet  and will be delivered to her house by [facility]

## 2024-12-07 ENCOUNTER — Other Ambulatory Visit: Payer: Self-pay

## 2024-12-07 MED ORDER — TRAZODONE HCL 50 MG PO TABS: ORAL_TABLET | ORAL | 1 refills | Status: AC

## 2024-12-07 MED ORDER — BACLOFEN 20 MG PO TABS: ORAL_TABLET | ORAL | 1 refills | Status: AC

## 2024-12-21 ENCOUNTER — Ambulatory Visit: Payer: Self-pay | Admitting: Internal Medicine

## 2024-12-22 ENCOUNTER — Ambulatory Visit: Admitting: Physical Medicine and Rehabilitation

## 2025-02-16 ENCOUNTER — Ambulatory Visit: Admitting: Physical Medicine and Rehabilitation

## 2025-03-11 ENCOUNTER — Encounter: Admitting: Physical Medicine and Rehabilitation
